# Patient Record
Sex: Male | Born: 1940 | Race: Black or African American | Hispanic: No | Marital: Married | Smoking: Former smoker
Health system: Southern US, Community
[De-identification: ages and names within clinical notes are randomized; demographics above are authoritative.]

## PROBLEM LIST (undated history)

## (undated) DIAGNOSIS — I2699 Other pulmonary embolism without acute cor pulmonale: Secondary | ICD-10-CM

## (undated) DIAGNOSIS — D126 Benign neoplasm of colon, unspecified: Secondary | ICD-10-CM

## (undated) DIAGNOSIS — K648 Other hemorrhoids: Secondary | ICD-10-CM

## (undated) DIAGNOSIS — R12 Heartburn: Secondary | ICD-10-CM

## (undated) DIAGNOSIS — K219 Gastro-esophageal reflux disease without esophagitis: Secondary | ICD-10-CM

## (undated) DIAGNOSIS — D1803 Hemangioma of intra-abdominal structures: Secondary | ICD-10-CM

## (undated) DIAGNOSIS — R51 Headache: Secondary | ICD-10-CM

## (undated) DIAGNOSIS — I10 Essential (primary) hypertension: Secondary | ICD-10-CM

## (undated) DIAGNOSIS — D72819 Decreased white blood cell count, unspecified: Secondary | ICD-10-CM

## (undated) DIAGNOSIS — K635 Polyp of colon: Secondary | ICD-10-CM

## (undated) DIAGNOSIS — G8929 Other chronic pain: Secondary | ICD-10-CM

## (undated) DIAGNOSIS — N400 Enlarged prostate without lower urinary tract symptoms: Secondary | ICD-10-CM

## (undated) DIAGNOSIS — D649 Anemia, unspecified: Secondary | ICD-10-CM

## (undated) DIAGNOSIS — R519 Headache, unspecified: Secondary | ICD-10-CM

## (undated) DIAGNOSIS — K760 Fatty (change of) liver, not elsewhere classified: Secondary | ICD-10-CM

## (undated) DIAGNOSIS — K449 Diaphragmatic hernia without obstruction or gangrene: Secondary | ICD-10-CM

## (undated) HISTORY — DX: Headache: R51

## (undated) HISTORY — DX: Headache, unspecified: R51.9

## (undated) HISTORY — DX: Gastro-esophageal reflux disease without esophagitis: K21.9

## (undated) HISTORY — DX: Anemia, unspecified: D64.9

## (undated) HISTORY — DX: Other pulmonary embolism without acute cor pulmonale: I26.99

## (undated) HISTORY — DX: Essential (primary) hypertension: I10

## (undated) HISTORY — DX: Decreased white blood cell count, unspecified: D72.819

## (undated) HISTORY — DX: Heartburn: R12

## (undated) HISTORY — DX: Benign neoplasm of colon, unspecified: D12.6

## (undated) HISTORY — DX: Benign prostatic hyperplasia without lower urinary tract symptoms: N40.0

## (undated) HISTORY — DX: Other chronic pain: G89.29

## (undated) HISTORY — DX: Polyp of colon: K63.5

## (undated) HISTORY — PX: CATARACT EXTRACTION: SUR2

## (undated) HISTORY — DX: Fatty (change of) liver, not elsewhere classified: K76.0

## (undated) HISTORY — DX: Other hemorrhoids: K64.8

## (undated) HISTORY — PX: COLONOSCOPY: SHX174

## (undated) HISTORY — DX: Diaphragmatic hernia without obstruction or gangrene: K44.9

## (undated) HISTORY — DX: Hemangioma of intra-abdominal structures: D18.03

---

## 1998-04-02 ENCOUNTER — Emergency Department (HOSPITAL_COMMUNITY): Admission: EM | Admit: 1998-04-02 | Discharge: 1998-04-02 | Payer: Self-pay | Admitting: Emergency Medicine

## 1998-04-23 ENCOUNTER — Ambulatory Visit (HOSPITAL_COMMUNITY): Admission: RE | Admit: 1998-04-23 | Discharge: 1998-04-23 | Payer: Self-pay | Admitting: Gastroenterology

## 1998-09-24 ENCOUNTER — Encounter: Admission: RE | Admit: 1998-09-24 | Discharge: 1998-09-24 | Payer: Self-pay | Admitting: Internal Medicine

## 1999-01-01 ENCOUNTER — Encounter: Admission: RE | Admit: 1999-01-01 | Discharge: 1999-01-01 | Payer: Self-pay | Admitting: Internal Medicine

## 1999-03-09 ENCOUNTER — Encounter: Payer: Self-pay | Admitting: Emergency Medicine

## 1999-03-09 ENCOUNTER — Emergency Department (HOSPITAL_COMMUNITY): Admission: EM | Admit: 1999-03-09 | Discharge: 1999-03-09 | Payer: Self-pay | Admitting: Emergency Medicine

## 1999-08-08 ENCOUNTER — Other Ambulatory Visit: Admission: RE | Admit: 1999-08-08 | Discharge: 1999-08-08 | Payer: Self-pay | Admitting: Gastroenterology

## 2000-05-25 ENCOUNTER — Emergency Department (HOSPITAL_COMMUNITY): Admission: EM | Admit: 2000-05-25 | Discharge: 2000-05-25 | Payer: Self-pay | Admitting: *Deleted

## 2000-09-25 ENCOUNTER — Emergency Department (HOSPITAL_COMMUNITY): Admission: EM | Admit: 2000-09-25 | Discharge: 2000-09-25 | Payer: Self-pay | Admitting: Emergency Medicine

## 2000-10-07 ENCOUNTER — Ambulatory Visit (HOSPITAL_COMMUNITY): Admission: RE | Admit: 2000-10-07 | Discharge: 2000-10-07 | Payer: Self-pay | Admitting: Gastroenterology

## 2000-10-07 ENCOUNTER — Encounter: Payer: Self-pay | Admitting: Gastroenterology

## 2001-01-05 ENCOUNTER — Ambulatory Visit (HOSPITAL_COMMUNITY): Admission: RE | Admit: 2001-01-05 | Discharge: 2001-01-05 | Payer: Self-pay | Admitting: Internal Medicine

## 2001-01-05 ENCOUNTER — Encounter: Payer: Self-pay | Admitting: Internal Medicine

## 2001-07-22 ENCOUNTER — Emergency Department (HOSPITAL_COMMUNITY): Admission: EM | Admit: 2001-07-22 | Discharge: 2001-07-22 | Payer: Self-pay | Admitting: Emergency Medicine

## 2001-07-22 ENCOUNTER — Encounter: Payer: Self-pay | Admitting: Emergency Medicine

## 2001-09-20 ENCOUNTER — Encounter: Payer: Self-pay | Admitting: Emergency Medicine

## 2001-09-20 ENCOUNTER — Emergency Department (HOSPITAL_COMMUNITY): Admission: EM | Admit: 2001-09-20 | Discharge: 2001-09-20 | Payer: Self-pay | Admitting: Emergency Medicine

## 2001-11-12 ENCOUNTER — Emergency Department (HOSPITAL_COMMUNITY): Admission: EM | Admit: 2001-11-12 | Discharge: 2001-11-12 | Payer: Self-pay | Admitting: *Deleted

## 2001-11-15 ENCOUNTER — Emergency Department (HOSPITAL_COMMUNITY): Admission: EM | Admit: 2001-11-15 | Discharge: 2001-11-15 | Payer: Self-pay | Admitting: Emergency Medicine

## 2002-01-29 ENCOUNTER — Emergency Department (HOSPITAL_COMMUNITY): Admission: EM | Admit: 2002-01-29 | Discharge: 2002-01-29 | Payer: Self-pay | Admitting: Emergency Medicine

## 2002-09-15 ENCOUNTER — Ambulatory Visit (HOSPITAL_COMMUNITY): Admission: RE | Admit: 2002-09-15 | Discharge: 2002-09-15 | Payer: Self-pay | Admitting: Internal Medicine

## 2002-09-15 ENCOUNTER — Encounter: Payer: Self-pay | Admitting: Internal Medicine

## 2003-09-01 ENCOUNTER — Ambulatory Visit (HOSPITAL_COMMUNITY): Admission: RE | Admit: 2003-09-01 | Discharge: 2003-09-01 | Payer: Self-pay | Admitting: Oncology

## 2003-09-19 ENCOUNTER — Emergency Department (HOSPITAL_COMMUNITY): Admission: AD | Admit: 2003-09-19 | Discharge: 2003-09-19 | Payer: Self-pay | Admitting: Family Medicine

## 2003-10-05 ENCOUNTER — Encounter (INDEPENDENT_AMBULATORY_CARE_PROVIDER_SITE_OTHER): Payer: Self-pay | Admitting: Specialist

## 2003-10-05 ENCOUNTER — Other Ambulatory Visit: Admission: RE | Admit: 2003-10-05 | Discharge: 2003-10-05 | Payer: Self-pay | Admitting: Oncology

## 2003-11-03 ENCOUNTER — Ambulatory Visit (HOSPITAL_COMMUNITY): Admission: RE | Admit: 2003-11-03 | Discharge: 2003-11-03 | Payer: Self-pay | Admitting: Internal Medicine

## 2004-02-23 ENCOUNTER — Emergency Department (HOSPITAL_COMMUNITY): Admission: EM | Admit: 2004-02-23 | Discharge: 2004-02-23 | Payer: Self-pay | Admitting: Emergency Medicine

## 2004-08-09 ENCOUNTER — Emergency Department (HOSPITAL_COMMUNITY): Admission: EM | Admit: 2004-08-09 | Discharge: 2004-08-09 | Payer: Self-pay | Admitting: Emergency Medicine

## 2004-09-03 ENCOUNTER — Emergency Department (HOSPITAL_COMMUNITY): Admission: EM | Admit: 2004-09-03 | Discharge: 2004-09-03 | Payer: Self-pay | Admitting: Emergency Medicine

## 2004-09-19 ENCOUNTER — Ambulatory Visit: Payer: Self-pay | Admitting: Family Medicine

## 2004-10-10 ENCOUNTER — Emergency Department (HOSPITAL_COMMUNITY): Admission: EM | Admit: 2004-10-10 | Discharge: 2004-10-10 | Payer: Self-pay | Admitting: Emergency Medicine

## 2004-10-24 ENCOUNTER — Ambulatory Visit: Payer: Self-pay | Admitting: Family Medicine

## 2004-11-18 ENCOUNTER — Ambulatory Visit: Payer: Self-pay | Admitting: Oncology

## 2004-11-18 ENCOUNTER — Emergency Department (HOSPITAL_COMMUNITY): Admission: RE | Admit: 2004-11-18 | Discharge: 2004-11-18 | Payer: Self-pay | Admitting: Oncology

## 2004-12-02 ENCOUNTER — Ambulatory Visit: Payer: Self-pay | Admitting: Family Medicine

## 2005-01-01 ENCOUNTER — Emergency Department (HOSPITAL_COMMUNITY): Admission: EM | Admit: 2005-01-01 | Discharge: 2005-01-01 | Payer: Self-pay | Admitting: Emergency Medicine

## 2005-03-03 ENCOUNTER — Ambulatory Visit: Payer: Self-pay | Admitting: Family Medicine

## 2005-04-01 ENCOUNTER — Ambulatory Visit: Payer: Self-pay | Admitting: Family Medicine

## 2005-04-03 ENCOUNTER — Ambulatory Visit: Payer: Self-pay | Admitting: *Deleted

## 2005-04-08 ENCOUNTER — Ambulatory Visit: Payer: Self-pay | Admitting: Family Medicine

## 2005-04-25 ENCOUNTER — Emergency Department (HOSPITAL_COMMUNITY): Admission: EM | Admit: 2005-04-25 | Discharge: 2005-04-25 | Payer: Self-pay | Admitting: Emergency Medicine

## 2005-04-28 ENCOUNTER — Ambulatory Visit: Payer: Self-pay | Admitting: Family Medicine

## 2005-05-23 ENCOUNTER — Emergency Department (HOSPITAL_COMMUNITY): Admission: EM | Admit: 2005-05-23 | Discharge: 2005-05-23 | Payer: Self-pay | Admitting: Emergency Medicine

## 2005-06-13 ENCOUNTER — Emergency Department (HOSPITAL_COMMUNITY): Admission: EM | Admit: 2005-06-13 | Discharge: 2005-06-13 | Payer: Self-pay | Admitting: Emergency Medicine

## 2005-06-20 ENCOUNTER — Ambulatory Visit: Payer: Self-pay | Admitting: Family Medicine

## 2005-07-03 ENCOUNTER — Ambulatory Visit: Payer: Self-pay | Admitting: Family Medicine

## 2005-07-18 ENCOUNTER — Ambulatory Visit: Payer: Self-pay | Admitting: Family Medicine

## 2005-07-24 ENCOUNTER — Ambulatory Visit: Payer: Self-pay | Admitting: Family Medicine

## 2005-07-29 ENCOUNTER — Ambulatory Visit: Payer: Self-pay | Admitting: Family Medicine

## 2005-08-19 ENCOUNTER — Emergency Department (HOSPITAL_COMMUNITY): Admission: EM | Admit: 2005-08-19 | Discharge: 2005-08-19 | Payer: Self-pay | Admitting: Emergency Medicine

## 2005-09-02 ENCOUNTER — Ambulatory Visit: Payer: Self-pay | Admitting: Family Medicine

## 2005-11-17 ENCOUNTER — Ambulatory Visit: Payer: Self-pay | Admitting: Oncology

## 2005-11-25 ENCOUNTER — Emergency Department (HOSPITAL_COMMUNITY): Admission: EM | Admit: 2005-11-25 | Discharge: 2005-11-25 | Payer: Self-pay | Admitting: Emergency Medicine

## 2005-12-17 ENCOUNTER — Emergency Department (HOSPITAL_COMMUNITY): Admission: EM | Admit: 2005-12-17 | Discharge: 2005-12-17 | Payer: Self-pay | Admitting: Internal Medicine

## 2005-12-18 ENCOUNTER — Emergency Department (HOSPITAL_COMMUNITY): Admission: EM | Admit: 2005-12-18 | Discharge: 2005-12-18 | Payer: Self-pay | Admitting: Family Medicine

## 2006-02-24 ENCOUNTER — Emergency Department (HOSPITAL_COMMUNITY): Admission: EM | Admit: 2006-02-24 | Discharge: 2006-02-24 | Payer: Self-pay | Admitting: Emergency Medicine

## 2006-02-25 ENCOUNTER — Ambulatory Visit: Payer: Self-pay | Admitting: Family Medicine

## 2006-02-27 ENCOUNTER — Ambulatory Visit: Payer: Self-pay | Admitting: Family Medicine

## 2006-03-19 ENCOUNTER — Ambulatory Visit: Payer: Self-pay | Admitting: Gastroenterology

## 2006-03-31 ENCOUNTER — Ambulatory Visit (HOSPITAL_COMMUNITY): Admission: RE | Admit: 2006-03-31 | Discharge: 2006-03-31 | Payer: Self-pay | Admitting: Internal Medicine

## 2006-03-31 ENCOUNTER — Ambulatory Visit: Payer: Self-pay | Admitting: Family Medicine

## 2006-05-18 ENCOUNTER — Emergency Department (HOSPITAL_COMMUNITY): Admission: EM | Admit: 2006-05-18 | Discharge: 2006-05-18 | Payer: Self-pay | Admitting: Emergency Medicine

## 2006-05-18 ENCOUNTER — Ambulatory Visit: Payer: Self-pay | Admitting: Family Medicine

## 2006-06-21 ENCOUNTER — Ambulatory Visit: Payer: Self-pay | Admitting: Family Medicine

## 2006-06-21 ENCOUNTER — Observation Stay (HOSPITAL_COMMUNITY): Admission: EM | Admit: 2006-06-21 | Discharge: 2006-06-22 | Payer: Self-pay | Admitting: Emergency Medicine

## 2006-07-01 ENCOUNTER — Ambulatory Visit: Payer: Self-pay | Admitting: Family Medicine

## 2006-07-31 ENCOUNTER — Emergency Department (HOSPITAL_COMMUNITY): Admission: EM | Admit: 2006-07-31 | Discharge: 2006-07-31 | Payer: Self-pay | Admitting: Emergency Medicine

## 2006-08-17 ENCOUNTER — Ambulatory Visit: Payer: Self-pay | Admitting: Gastroenterology

## 2006-08-25 ENCOUNTER — Ambulatory Visit: Payer: Self-pay | Admitting: Gastroenterology

## 2006-08-25 ENCOUNTER — Encounter (INDEPENDENT_AMBULATORY_CARE_PROVIDER_SITE_OTHER): Payer: Self-pay | Admitting: *Deleted

## 2006-08-26 ENCOUNTER — Ambulatory Visit: Payer: Self-pay | Admitting: Gastroenterology

## 2006-09-02 ENCOUNTER — Ambulatory Visit: Payer: Self-pay | Admitting: Gastroenterology

## 2006-09-02 LAB — CONVERTED CEMR LAB
BUN: 13 mg/dL (ref 6–23)
Creatinine, Ser: 1.1 mg/dL (ref 0.4–1.5)

## 2006-09-03 ENCOUNTER — Ambulatory Visit: Payer: Self-pay | Admitting: *Deleted

## 2006-09-18 ENCOUNTER — Ambulatory Visit: Payer: Self-pay | Admitting: Family Medicine

## 2006-09-21 ENCOUNTER — Ambulatory Visit (HOSPITAL_COMMUNITY): Admission: RE | Admit: 2006-09-21 | Discharge: 2006-09-21 | Payer: Self-pay | Admitting: Family Medicine

## 2006-11-08 ENCOUNTER — Emergency Department (HOSPITAL_COMMUNITY): Admission: EM | Admit: 2006-11-08 | Discharge: 2006-11-08 | Payer: Self-pay | Admitting: Emergency Medicine

## 2006-12-31 ENCOUNTER — Ambulatory Visit: Payer: Self-pay | Admitting: Family Medicine

## 2007-02-27 ENCOUNTER — Emergency Department (HOSPITAL_COMMUNITY): Admission: EM | Admit: 2007-02-27 | Discharge: 2007-02-27 | Payer: Self-pay | Admitting: Emergency Medicine

## 2007-03-24 ENCOUNTER — Emergency Department (HOSPITAL_COMMUNITY): Admission: EM | Admit: 2007-03-24 | Discharge: 2007-03-24 | Payer: Self-pay | Admitting: Emergency Medicine

## 2007-04-15 ENCOUNTER — Ambulatory Visit: Payer: Self-pay | Admitting: Family Medicine

## 2007-04-27 DIAGNOSIS — K219 Gastro-esophageal reflux disease without esophagitis: Secondary | ICD-10-CM | POA: Insufficient documentation

## 2007-04-27 DIAGNOSIS — I10 Essential (primary) hypertension: Secondary | ICD-10-CM | POA: Insufficient documentation

## 2007-05-31 ENCOUNTER — Ambulatory Visit: Payer: Self-pay | Admitting: Family Medicine

## 2007-06-22 ENCOUNTER — Ambulatory Visit: Payer: Self-pay | Admitting: Internal Medicine

## 2007-07-13 ENCOUNTER — Ambulatory Visit: Payer: Self-pay | Admitting: Infectious Diseases

## 2007-07-13 ENCOUNTER — Observation Stay (HOSPITAL_COMMUNITY): Admission: EM | Admit: 2007-07-13 | Discharge: 2007-07-14 | Payer: Self-pay | Admitting: Emergency Medicine

## 2007-07-14 ENCOUNTER — Encounter: Payer: Self-pay | Admitting: Internal Medicine

## 2007-07-21 ENCOUNTER — Encounter (INDEPENDENT_AMBULATORY_CARE_PROVIDER_SITE_OTHER): Payer: Self-pay | Admitting: *Deleted

## 2007-07-22 ENCOUNTER — Ambulatory Visit: Payer: Self-pay

## 2007-08-04 ENCOUNTER — Ambulatory Visit: Payer: Self-pay | Admitting: Internal Medicine

## 2007-08-05 ENCOUNTER — Encounter (INDEPENDENT_AMBULATORY_CARE_PROVIDER_SITE_OTHER): Payer: Self-pay | Admitting: Family Medicine

## 2007-10-15 ENCOUNTER — Ambulatory Visit: Payer: Self-pay | Admitting: Internal Medicine

## 2007-10-20 ENCOUNTER — Ambulatory Visit (HOSPITAL_COMMUNITY): Admission: RE | Admit: 2007-10-20 | Discharge: 2007-10-20 | Payer: Self-pay | Admitting: Nurse Practitioner

## 2007-10-27 ENCOUNTER — Ambulatory Visit: Payer: Self-pay | Admitting: Gastroenterology

## 2007-10-29 ENCOUNTER — Ambulatory Visit (HOSPITAL_COMMUNITY): Admission: RE | Admit: 2007-10-29 | Discharge: 2007-10-29 | Payer: Self-pay | Admitting: Gastroenterology

## 2007-12-23 ENCOUNTER — Ambulatory Visit: Payer: Self-pay | Admitting: Nurse Practitioner

## 2008-01-24 ENCOUNTER — Ambulatory Visit: Payer: Self-pay | Admitting: Nurse Practitioner

## 2008-01-24 LAB — CONVERTED CEMR LAB
AST: 15 units/L (ref 0–37)
Albumin: 4.8 g/dL (ref 3.5–5.2)
Alkaline Phosphatase: 23 units/L — ABNORMAL LOW (ref 39–117)
BUN: 13 mg/dL (ref 6–23)
Basophils Absolute: 0 10*3/uL (ref 0.0–0.1)
Basophils Relative: 1 % (ref 0–1)
Creatinine, Ser: 0.95 mg/dL (ref 0.40–1.50)
Eosinophils Absolute: 0 10*3/uL (ref 0.0–0.7)
HDL: 60 mg/dL (ref >39)
LDL Cholesterol: 79 mg/dL (ref 0–99)
MCHC: 33.7 g/dL (ref 30.0–36.0)
MCV: 93.3 fL (ref 78.0–100.0)
Monocytes Relative: 17 % — ABNORMAL HIGH (ref 3–12)
Neutrophils Relative %: 52 % (ref 43–77)
Platelets: 188 10*3/uL (ref 150–400)
Potassium: 4.6 meq/L (ref 3.5–5.3)
RBC: 4.36 M/uL (ref 4.22–5.81)
RDW: 15.5 % (ref 11.5–15.5)
TSH: 1.575 microintl units/mL (ref 0.350–5.50)
Total CHOL/HDL Ratio: 2.5

## 2008-02-03 ENCOUNTER — Emergency Department (HOSPITAL_COMMUNITY): Admission: EM | Admit: 2008-02-03 | Discharge: 2008-02-03 | Payer: Self-pay | Admitting: Emergency Medicine

## 2008-03-08 ENCOUNTER — Ambulatory Visit: Payer: Self-pay | Admitting: Family Medicine

## 2008-03-13 ENCOUNTER — Ambulatory Visit (HOSPITAL_COMMUNITY): Admission: RE | Admit: 2008-03-13 | Discharge: 2008-03-13 | Payer: Self-pay | Admitting: Family Medicine

## 2008-03-29 ENCOUNTER — Ambulatory Visit (HOSPITAL_COMMUNITY): Admission: RE | Admit: 2008-03-29 | Discharge: 2008-03-29 | Payer: Self-pay | Admitting: Cardiology

## 2008-04-04 ENCOUNTER — Ambulatory Visit: Payer: Self-pay | Admitting: Internal Medicine

## 2008-04-20 ENCOUNTER — Encounter: Admission: RE | Admit: 2008-04-20 | Discharge: 2008-05-09 | Payer: Self-pay | Admitting: Family Medicine

## 2008-12-05 ENCOUNTER — Emergency Department (HOSPITAL_COMMUNITY): Admission: EM | Admit: 2008-12-05 | Discharge: 2008-12-05 | Payer: Self-pay | Admitting: Family Medicine

## 2009-01-01 DIAGNOSIS — D126 Benign neoplasm of colon, unspecified: Secondary | ICD-10-CM

## 2009-01-01 HISTORY — DX: Benign neoplasm of colon, unspecified: D12.6

## 2009-01-17 ENCOUNTER — Emergency Department (HOSPITAL_COMMUNITY): Admission: EM | Admit: 2009-01-17 | Discharge: 2009-01-17 | Payer: Self-pay | Admitting: Emergency Medicine

## 2009-01-19 ENCOUNTER — Ambulatory Visit (HOSPITAL_COMMUNITY): Admission: RE | Admit: 2009-01-19 | Discharge: 2009-01-19 | Payer: Self-pay | Admitting: Gastroenterology

## 2009-01-19 ENCOUNTER — Encounter (INDEPENDENT_AMBULATORY_CARE_PROVIDER_SITE_OTHER): Payer: Self-pay | Admitting: Gastroenterology

## 2009-01-19 ENCOUNTER — Encounter (INDEPENDENT_AMBULATORY_CARE_PROVIDER_SITE_OTHER): Payer: Self-pay | Admitting: *Deleted

## 2009-02-22 ENCOUNTER — Encounter: Admission: RE | Admit: 2009-02-22 | Discharge: 2009-02-22 | Payer: Self-pay | Admitting: Gastroenterology

## 2009-02-22 ENCOUNTER — Encounter (INDEPENDENT_AMBULATORY_CARE_PROVIDER_SITE_OTHER): Payer: Self-pay | Admitting: *Deleted

## 2009-05-03 ENCOUNTER — Inpatient Hospital Stay (HOSPITAL_COMMUNITY): Admission: EM | Admit: 2009-05-03 | Discharge: 2009-05-04 | Payer: Self-pay | Admitting: Emergency Medicine

## 2009-05-04 ENCOUNTER — Encounter (INDEPENDENT_AMBULATORY_CARE_PROVIDER_SITE_OTHER): Payer: Self-pay | Admitting: *Deleted

## 2009-08-09 ENCOUNTER — Encounter (INDEPENDENT_AMBULATORY_CARE_PROVIDER_SITE_OTHER): Payer: Self-pay | Admitting: *Deleted

## 2009-08-17 ENCOUNTER — Telehealth: Payer: Self-pay | Admitting: Gastroenterology

## 2009-08-20 ENCOUNTER — Encounter: Payer: Self-pay | Admitting: Gastroenterology

## 2009-08-27 ENCOUNTER — Ambulatory Visit: Payer: Self-pay | Admitting: Gastroenterology

## 2009-08-31 ENCOUNTER — Ambulatory Visit: Payer: Self-pay | Admitting: Gastroenterology

## 2009-08-31 ENCOUNTER — Encounter: Payer: Self-pay | Admitting: Gastroenterology

## 2009-09-03 ENCOUNTER — Encounter: Payer: Self-pay | Admitting: Gastroenterology

## 2009-09-03 ENCOUNTER — Telehealth (INDEPENDENT_AMBULATORY_CARE_PROVIDER_SITE_OTHER): Payer: Self-pay

## 2009-09-03 ENCOUNTER — Encounter (INDEPENDENT_AMBULATORY_CARE_PROVIDER_SITE_OTHER): Payer: Self-pay

## 2009-09-04 ENCOUNTER — Telehealth (INDEPENDENT_AMBULATORY_CARE_PROVIDER_SITE_OTHER): Payer: Self-pay

## 2009-12-22 ENCOUNTER — Emergency Department (HOSPITAL_COMMUNITY): Admission: EM | Admit: 2009-12-22 | Discharge: 2009-12-23 | Payer: Self-pay | Admitting: Emergency Medicine

## 2010-01-08 ENCOUNTER — Emergency Department (HOSPITAL_COMMUNITY): Admission: EM | Admit: 2010-01-08 | Discharge: 2010-01-08 | Payer: Self-pay | Admitting: Emergency Medicine

## 2010-02-18 ENCOUNTER — Telehealth: Payer: Self-pay | Admitting: Gastroenterology

## 2010-02-21 ENCOUNTER — Telehealth: Payer: Self-pay | Admitting: Gastroenterology

## 2010-04-22 ENCOUNTER — Telehealth: Payer: Self-pay | Admitting: Gastroenterology

## 2010-05-15 ENCOUNTER — Ambulatory Visit: Payer: Self-pay | Admitting: Gastroenterology

## 2010-05-15 DIAGNOSIS — R1084 Generalized abdominal pain: Secondary | ICD-10-CM

## 2010-05-15 DIAGNOSIS — R109 Unspecified abdominal pain: Secondary | ICD-10-CM | POA: Insufficient documentation

## 2010-05-15 DIAGNOSIS — G8929 Other chronic pain: Secondary | ICD-10-CM

## 2010-05-15 LAB — CONVERTED CEMR LAB
AST: 26 units/L (ref 0–37)
Alkaline Phosphatase: 25 units/L — ABNORMAL LOW (ref 39–117)
BUN: 13 mg/dL (ref 6–23)
Basophils Absolute: 0 10*3/uL (ref 0.0–0.1)
Calcium: 9.2 mg/dL (ref 8.4–10.5)
GFR calc non Af Amer: 125.64 mL/min (ref >60)
Glucose, Bld: 100 mg/dL — ABNORMAL HIGH (ref 70–99)
Lymphocytes Relative: 28.5 % (ref 12.0–46.0)
Monocytes Relative: 12.2 % — ABNORMAL HIGH (ref 3.0–12.0)
Platelets: 137 10*3/uL — ABNORMAL LOW (ref 150.0–400.0)
RDW: 13.8 % (ref 11.5–14.6)
TSH: 1.71 microintl units/mL (ref 0.35–5.50)
Total Bilirubin: 1 mg/dL (ref 0.3–1.2)

## 2010-05-16 ENCOUNTER — Telehealth: Payer: Self-pay | Admitting: Gastroenterology

## 2010-05-17 ENCOUNTER — Encounter: Payer: Self-pay | Admitting: Gastroenterology

## 2010-05-21 ENCOUNTER — Telehealth: Payer: Self-pay | Admitting: Gastroenterology

## 2010-05-21 ENCOUNTER — Encounter: Payer: Self-pay | Admitting: Gastroenterology

## 2010-05-29 ENCOUNTER — Telehealth: Payer: Self-pay | Admitting: Gastroenterology

## 2010-06-11 ENCOUNTER — Telehealth: Payer: Self-pay | Admitting: Gastroenterology

## 2010-06-17 ENCOUNTER — Ambulatory Visit: Payer: Self-pay | Admitting: Gastroenterology

## 2010-06-17 LAB — CONVERTED CEMR LAB
Basophils Relative: 0.7 % (ref 0.0–3.0)
Eosinophils Absolute: 0 10*3/uL (ref 0.0–0.7)
Eosinophils Relative: 0.6 % (ref 0.0–5.0)
Hemoglobin: 14.1 g/dL (ref 13.0–17.0)
Lymphocytes Relative: 42.5 % (ref 12.0–46.0)
MCHC: 34.6 g/dL (ref 30.0–36.0)
MCV: 94.1 fL (ref 78.0–100.0)
Neutro Abs: 1.2 10*3/uL — ABNORMAL LOW (ref 1.4–7.7)
RBC: 4.32 M/uL (ref 4.22–5.81)

## 2010-07-10 ENCOUNTER — Telehealth: Payer: Self-pay | Admitting: Gastroenterology

## 2010-08-08 ENCOUNTER — Telehealth: Payer: Self-pay | Admitting: Gastroenterology

## 2010-09-12 ENCOUNTER — Telehealth: Payer: Self-pay | Admitting: Gastroenterology

## 2010-09-19 ENCOUNTER — Telehealth: Payer: Self-pay | Admitting: Gastroenterology

## 2010-10-10 ENCOUNTER — Telehealth: Payer: Self-pay | Admitting: Gastroenterology

## 2010-11-05 ENCOUNTER — Emergency Department (HOSPITAL_COMMUNITY)
Admission: EM | Admit: 2010-11-05 | Discharge: 2010-11-05 | Payer: Self-pay | Source: Home / Self Care | Admitting: Emergency Medicine

## 2010-12-03 NOTE — Progress Notes (Signed)
Summary: "heavy" feeling in abd  Medications Added OMEPRAZOLE 20 MG CPDR (OMEPRAZOLE) Take 1 capsule p.o. one half hr. before breakfast       Phone Note Call from Patient Call back at [facility] Phone [phone]   Caller: Patient Call For: Dr. Russella Dar Reason for Call: Talk to Nurse Summary of Call: pt having a "heavy" feeling in the lower part of his abd since procedure last year... wants to speak to a nurse now about it rather than sch appt. Initial call taken by: Vallarie Mare,  February 18, 2010 11:57 AM  Follow-up for Phone Call        Pt. gets heavy feeling in lower abd. when he gets constipated. Advised to increase fiber and fliuds. Pt. is not taking a PPI as ordered for his Barrett's says he didn't know he was supposed to. Is using Zantac q.d. Is on medicaid so they will only cover Omeprazole Please advise on dose. Follow-up by: Teryl Lucy RN,  February 19, 2010 9:19 AM  Additional Follow-up for Phone Call Additional follow up Details #1::        He needs a PPI long term for Barretts. DC Zantac. Begin Omeprazole 20mg  by mouth qam, #30, 11 refils. May add Miralax OTC up to two times a day as needed if fiber and fluids are not successful in controlling constipation. Additional Follow-up by: Meryl Dare MD Clementeen Graham,  February 19, 2010 9:28 AM    Additional Follow-up for Phone Call Additional follow up Details #2::    pt. ntfd. of Dr. Ardell Isaacs orders and omeprazole rx. faxed to pharmacy. Follow-up by: Teryl Lucy RN,  February 19, 2010 11:10 AM  New/Updated Medications: OMEPRAZOLE 20 MG CPDR (OMEPRAZOLE) Take 1 capsule p.o. one half hr. before breakfast Prescriptions: OMEPRAZOLE 20 MG CPDR (OMEPRAZOLE) Take 1 capsule p.o. one half hr. before breakfast  #30 x 11   Entered by:   Teryl Lucy RN   Authorized by:   Meryl Dare MD [facility]   Signed by:   Teryl Lucy RN on 02/19/2010   Method used:   Electronically to        [facility] [phone]* (retail)       [address]  [place], [place]  [address]       Ph: [phone]       Fax: [phone]   RxID:   [id number]

## 2010-12-03 NOTE — Progress Notes (Signed)
Summary: Medication Refill   Phone Note Call from Patient Call back at Home Phone [phone]   Caller: Patient Call For: Dr. Russella Dar Reason for Call: Refill Medication Details for Reason: Medication Refill Summary of Call: Pt. called stating his pharmacy is still only dispensing 30 Omeprazole instead of 60 and will need new orders to reflect this dosage. He uses [facility] [address] [employer]. Please call him if you have questions. Thank you. Initial call taken by: Schuyler Amor,  September 12, 2010 9:50 AM  Follow-up for Phone Call        Called the pharmacy to clarify why pt recieved once daily dosing instead of the two times a day dosing when we sent in the prescription for that on 08/08/10. Pharmacist states the pt called in the refill under the old refill number on his bottle and picked up the old prescription under that ld number. Pharmacist states she deleted the old number and in the future pt will not be able to use that number and can only get refills under the new two times a day dosing prescription.  Left a message on pts voicemial informing him  of this and to take 2 tablets by mouth until he runs out of medicine. Then we can possibly give him samples until next refill. Told pt to call back with any other concerns and questions.  Follow-up by: Christie Nottingham CMA Duncan Dull),  September 12, 2010 10:36 AM

## 2010-12-03 NOTE — Progress Notes (Signed)
Summary: Medication   Phone Note Call from Patient   Caller: Patient Call For: Dr. Russella Dar Reason for Call: Talk to Nurse Summary of Call: Ins. needs prior auth for his Omeprazole med.   [phone] Initial call taken by: Karna Christmas,  May 16, 2010 11:59 AM  Follow-up for Phone Call        waiting on fax form to fill out for PA Follow-up by: Christie Nottingham CMA Duncan Dull),  May 16, 2010 4:34 PM  Additional Follow-up for Phone Call Additional follow up Details #1::        Left a message for pt letting him know we filled out the form for his PA and faxed it. We are waiting on a response from the insurance company.  Additional Follow-up by: Christie Nottingham CMA Duncan Dull),  May 17, 2010 8:24 AM    Additional Follow-up for Phone Call Additional follow up Details #2::    Left a message for pt telling him his insurance company sent [place] a fax stating that a PA for omeprazole is not required at this time. I told him to contact his insurance company if he is having further problems with his medicine being approved. Follow-up by: Christie Nottingham CMA Duncan Dull),  May 17, 2010 4:29 PM

## 2010-12-03 NOTE — Assessment & Plan Note (Signed)
Summary: "funny feeling in stomach"/sheri   History of Present Illness Visit Type: Follow-up Visit Primary GI MD: Elie Goody MD [facility] Primary Provider: Duke Salvia, MD Chief Complaint: pt has a nauseating abdominal pain History of Present Illness:   This is a 70 year old male, who complains of sensation of phlegm in his throat and recurrent throat clearing. He has chronic GERD and Barrett's esophagus and was off acid suppressants for several months and restarted them on our advice about 3 months ago. The symptoms improved temporarily but have returned. He states he saw asn ENT physician previously for this but does not recall any specific diagnosis made.  He also has intermittent lower abdominal pain, associated with nausea. These symptoms are mild, unpredictable and not necessarily associated with meals, or any digestive function. He underwent an abdominal ultrasound that was negative, except for a small left renal lesion, in July 2010 an abd/pelvic CT in April 2010 which showed a small hepatic hemangioma and a small left renal lesion.   GI Review of Systems    Reports abdominal pain and  nausea.     Location of  Abdominal pain: lower abdomen.    Denies acid reflux, belching, bloating, chest pain, dysphagia with liquids, dysphagia with solids, heartburn, loss of appetite, vomiting, vomiting blood, weight loss, and  weight gain.        Denies anal fissure, black tarry stools, change in bowel habit, constipation, diarrhea, diverticulosis, fecal incontinence, heme positive stool, hemorrhoids, irritable bowel syndrome, jaundice, light color stool, liver problems, rectal bleeding, and  rectal pain. Preventive Screening-Counseling & Management  Alcohol-Tobacco     Smoking Status: quit      Drug Use:  no.     Current Medications (verified): 1)  Norvasc 5 Mg Tabs (Amlodipine Besylate) .... Take 1 Tablet By Mouth Once A Day 2)  Omeprazole 20 Mg Cpdr (Omeprazole) .... Take 1 Capsule P.o.  One Half Hr. 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 Cataract Surgery, Left Eye  Family History: Family History of Diabetes: Father (deceased), 2 brothers, sister  Social History: Retired 8 children  Heavy drinker since age 37--Alcohol Use - no Quit  ~10/2009 Patient is a former smoker.  Illicit Drug Use - no Smoking Status:  quit Drug Use:  no  Review of Systems       The pertinent positives and negatives are noted as above and in the HPI. All other ROS were reviewed and were negative.   Vital Signs:  Patient profile:   70 year old male Height:      71 inches Weight:      184 pounds BMI:     25.76 Pulse rate:   64 / minute Pulse rhythm:   regular BP sitting:   120 / 80  (left arm) Cuff size:   regular  Vitals Entered By: Francee Piccolo CMA Duncan Dull) (May 15, 2010 10:26 AM)  Physical Exam  General:  Well developed, well nourished, no acute distress. Head:  Normocephalic and atraumatic. Eyes:  PERRLA, no icterus. Ears:  Normal auditory acuity. Mouth:  No deformity or lesions, dentition normal. Lungs:  Clear throughout to auscultation. Heart:  Regular rate and rhythm; no murmurs, rubs,  or bruits. Abdomen:  Soft, nontender and nondistended. No masses, hepatosplenomegaly or hernias noted. Normal bowel sounds. Psych:  Alert and cooperative. Normal mood and affect.  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 The long term need for daily acid suppressant in the setting of Barrett's, and GERD was discussed with the  patient again. Increase omeprazole to 40 mg daily. If symptoms not well-controlled consider reevaluation with ENT and a stronger PPI.  Problem # 2:  ABDOMINAL PAIN OTHER SPECIFIED SITE (ICD-789.09) Nonspecific mild lower abdominal pain. Abdominal ultrasound,  CT scan and colonoscopy, all performed in 2010, were unremarkable and there are no significant findings on physical examination today. Trial of anti-spasmodic and high-fiber diet. Orders: TLB-BMP (Basic Metabolic Panel-BMET) (80048-METABOL) TLB-CBC Platelet - w/Differential (85025-CBCD) TLB-Hepatic/Liver Function Pnl (80076-HEPATIC) TLB-TSH (Thyroid Stimulating Hormone) (84443-TSH) TLB-Lipase (83690-LIPASE)  Patient Instructions: 1)  Get your labs drawn today in the basement.  2)  Increase your omeprazole to 2 tablets by mouth once daily until finished and pick up your prescription from your pharmacy for the omeprazole 40mg  tablets.  3)  Please continue current medications.  4)  Please schedule a follow-up appointment as needed.  5)  The medication list was reviewed and reconciled.  All changed / newly prescribed medications were explained.  A complete medication list was provided to the patient / caregiver. 6)  Copy sent to : Duke Salvia, MD  Prescriptions: LEVSIN 0.125 MG TABS (HYOSCYAMINE SULFATE) 1-2 tablets by mouth four times a day as needed for abdominal pain  #60 x 11   Entered by:   Christie Nottingham CMA (AAMA)   Authorized by:   Meryl Dare MD [facility]   Signed by:   Meryl Dare MD [facility] on 05/15/2010   Method used:   Electronically to        [facility] [phone]* (retail)       [address]       [place], [place]  [address]       Ph: [phone]       Fax: [phone]   RxID:   [id number] OMEPRAZOLE 40 MG CPDR (OMEPRAZOLE) one tablet by mouth once daily  #30 x 11   Entered by:   Christie Nottingham CMA (AAMA)   Authorized by:   Meryl Dare MD [facility]   Signed by:   Meryl Dare MD [facility] on 05/15/2010    Method used:   Electronically to        [facility] [phone]* (retail)       [address]       [place], [place]  [address]       Ph: [phone]       Fax: [phone]   RxID:   [id number]

## 2010-12-03 NOTE — Medication Information (Signed)
Summary: Approved/[facility]  Approved/[facility]   Imported By: Lester [place]ens[place] 05/28/2010 09:30:48  _____________________________________________________________________  External Attachment:    Type:   Image     Comment:   External Document

## 2010-12-03 NOTE — Assessment & Plan Note (Signed)
Summary: Hospitall Discharge  Patient admitted with chest pain. AMI ruled out- likely espophagitis/GERD. He will need an outpatient stress test. Scheduled with  C[facility] on 9/18. In addition, he will need sccreening colonoscopy arranged by [facility]. Med list update. Summary dictated.

## 2010-12-03 NOTE — Medication Information (Signed)
Summary: Denial/[facility]  Denial/[facility]   Imported By: Lester E[place] 05/28/2010 09:28:39  _____________________________________________________________________  External Attachment:    Type:   Image     Comment:   External Document

## 2010-12-03 NOTE — Progress Notes (Signed)
Summary: Samples for Omneprozole?   Phone Note Call from Patient Call back at [facility] Phone [phone]   Call For: Dr Russella Dar Summary of Call: Only has 2 days left of Omneprozole and its too early to be able to get his refill. Can we get him some samples? Initial call taken by: Leanor Kail [facility],  September 19, 2010 10:28 AM  Follow-up for Phone Call        Left samples up front for pt to pick and told him that is is Prilosec 20mg  and not omeprazole 40 but to double up on the medicine to equal to what he is currently taking. Pt verbalized understanding.  Follow-up by: Christie Nottingham CMA Duncan Dull),  September 19, 2010 11:08 AM

## 2010-12-03 NOTE — Progress Notes (Signed)
Summary: Triage   Phone Note Call from Patient Call back at Home Phone [phone]   Caller: Patient Call For: Dr. Russella Dar Reason for Call: Talk to Nurse Summary of Call: Pt is having a lot of bleeding but does not know where it is coming from, also having alot of stomach pain Initial call taken by: [place] Johnson,  October 10, 2010 8:21 AM  Follow-up for Phone Call        Spoke with patient and he states that when he urinated he had blood with clots that came from his "joint" I clarified with patient that the blood was when he urinated and that it was coming from his penis. Informed patient that he needed to call his PCP for this problem. Patient states that his PCP is a cardiologist, suggested he still call the PCP or go to the ER/Urgent care. Patient verbalizes understanding.  Follow-up by: Selinda Michaels RN,  October 10, 2010 10:18 AM  Additional Follow-up for Phone Call Additional follow up Details #1::        Agree. Urgent care or ED for evaluation today. Additional Follow-up by: Meryl Dare MD Clementeen Graham,  October 10, 2010 11:17 AM

## 2010-12-03 NOTE — Progress Notes (Signed)
Summary: Triage   Phone Note Call from Patient Call back at Home Phone [phone]   Caller: Patient Call For: Dr. Russella Dar Reason for Call: Talk to Nurse Summary of Call: Would like to discuss meds Initial call taken by: Karna Christmas,  February 21, 2010 8:08 AM  Follow-up for Phone Call        Left message for patient to call back Darcey Nora RN, [facility]  February 21, 2010 9:19 AM  questions about omeprazole answered.  Darcey Nora RN, [facility]  February 21, 2010 11:10 AM Follow-up by: Darcey Nora RN, CGRN,  February 21, 2010 11:10 AM

## 2010-12-03 NOTE — Progress Notes (Signed)
Summary: Question about meds ordered today   Phone Note Call from Patient Call back at Home Phone [phone]   Call For: Dr Russella Dar Reason for Call: Talk to Nurse Summary of Call: Question about the medicine that was ordered for him today. Initial call taken by: Leanor Kail [facility],  May 29, 2010 2:43 PM  Follow-up for Phone Call        I have left a message for the patient to call back. Dottie Nelson-Smith CMA Duncan Dull)  May 29, 2010 3:40 PM   Patient questions whether it is okay to use levsin and omeprazole together. I have advised him that this will be okay for him to do. Follow-up by: Lamona Curl CMA Duncan Dull),  May 29, 2010 4:24 PM

## 2010-12-03 NOTE — Progress Notes (Signed)
Summary: speak to nurse   Phone Note Call from Patient Call back at Home Phone [phone]   Caller: Patient Call For: Russella Dar Reason for Call: Talk to Nurse Summary of Call: Patient wants to speak to nurse regarding blood work that he's suppose to be scheduled for. Initial call taken by: Tawni Levy,  June 11, 2010 11:25 AM  Follow-up for Phone Call        Given  time the lab is open. Follow-up by: Teryl Lucy RN,  June 11, 2010 12:10 PM

## 2010-12-03 NOTE — Progress Notes (Signed)
Summary: Switch medication/ too expensive   Phone Note Call from Patient Call back at Home Phone [phone]   Caller: Patient Call For: Marchelle Folks Summary of Call: Patient called stating he cannot afford Levsin b/c it is too expensive. He told me the pharmacist told him that we could contact the insurance company and get it approved and his copay will be cheaper. I called the insurance company and they still denied the medicine. I informed pt and he wants to be switched to something cheaper. I told him we could try sending in another medication. Dr. Russella Dar can we switch him to Bentyl or robinul forte? Initial call taken by: Christie Nottingham CMA Duncan Dull),  May 21, 2010 1:18 PM  Follow-up for Phone Call        He can switch to dicyclomine, glycopyrrolate or hyoscyamine.  Which ever one he prefers for cost and efficacy. Follow-up by: Meryl Dare MD Clementeen Graham,  May 27, 2010 3:26 PM  Additional Follow-up for Phone Call Additional follow up Details #1::        Called pharmacy to get prices on alternative medicines. Monica from [facility] states she can get the Levsin even cheaper with a discount card. The price came down to 25 dollars so I contacted the pt and asked if this was sufficient or do I need to switch him to a cheaper medicine. Pt agreed that this was a better price and he states he will pick up the medicine. Additional Follow-up by: Christie Nottingham CMA Duncan Dull),  May 28, 2010 2:04 PM

## 2010-12-03 NOTE — Progress Notes (Signed)
Summary: stomach "funny feeling"   Phone Note Call from Patient Call back at Home Phone [phone]   Caller: Patient Call For: Dr. Russella Dar Reason for Call: Talk to Nurse Summary of Call: having a "funny feeling" in his stomach and "doesnt know whats going on"... "stomach is going up and down even after eating a little" Initial call taken by: Vallarie Mare,  April 22, 2010 11:11 AM  Follow-up for Phone Call        Left message for patient to call back Darcey Nora RN, [facility]  April 22, 2010 11:19 AM  Patient  c/o "funny feeling in my stomach", when asked to describe "I just have a feeling".  He is unable to characterize any better, but wants to be seen for it .  He is given an appointment with Dr Russella Dar for July.  He is asked to call back if he has any symptoms I can help him with in the meantime.  Follow-up by: Darcey Nora RN, CGRN,  April 22, 2010 11:32 AM

## 2010-12-03 NOTE — Progress Notes (Signed)
Summary: ? re meds   Phone Note Call from Patient Call back at Home Phone [phone]   Caller: Patient Call For: Dr Russella Dar Reason for Call: Talk to Nurse Summary of Call: Patient has questions regarding medication Initial call taken by: Tawni Levy,  July 10, 2010 4:37 PM  Follow-up for Phone Call        left message for pt  to call back  Follow-up by: Christie Nottingham CMA Duncan Dull),  July 11, 2010 10:13 AM  Additional Follow-up for Phone Call Additional follow up Details #1::        Pt states 30 minutes after taking his omeprazole he is having blurred vision. Told pt that this is probably not from his omeprazole but to try taking that medication 30 mins apart from his BP meds and other medications. If the symptoms continue then to D/C the  omeprazole and call his PCP if that does not improve his symptoms.  Additional Follow-up by: Christie Nottingham CMA Duncan Dull),  July 11, 2010 11:57 AM

## 2010-12-03 NOTE — Progress Notes (Signed)
Summary: throat problems  Medications Added OMEPRAZOLE 40 MG CPDR (OMEPRAZOLE) Take 1 tablet by mouth two times a day (pharmacy-please d/c prescription for once daily dosing)       Phone Note Call from Patient Call back at [facility] Phone [phone]   Caller: Patient Call For: Dr. Russella Dar Reason for Call: Talk to Nurse Summary of Call: feels like something is "stuck in his throat"... mostly feels this when he lays down... wants to speak with a nurse before scheduling an appt Initial call taken by: Vallarie Mare,  August 08, 2010 11:39 AM  Follow-up for Phone Call        Left message for patient to call back. Dottie Nelson-Ivory Bail CMA [facility] Dull)  August 08, 2010 1:15 PM   Patient states that he continues to have a feeling of phlegm getting stuck in his throat especially when he lies down at night. He states that he has been taking his omeprazole 40 mg daily 30 minutes before breakfast as recommended by Dr Russella Dar. He also states that he is following antireflux measures and avoiding eating several hours prior to bedtime. In your last office note, you wrote that if patient's symptoms did not improve that we may need to consider re-evaluation by ENT and or begin patient on stronger PPI. However, patient is very reluctant to go back to ENT as he said "they did nothing" for him last time. Please advise. Follow-up by: Lamona Curl CMA Duncan Dull),  August 08, 2010 1:41 PM  Additional Follow-up for Phone Call Additional follow up Details #1::        Increase omeprazole to 40mg  by mouth two times a day before breakfast and dinner. REV if symptoms do not resolve in 2-3 months.  Additional Follow-up by: Meryl Dare MD Clementeen Graham,  August 08, 2010 4:41 PM    Additional Follow-up for Phone Call Additional follow up Details #2::    Patient advised to increase omeprazole to 40 mg two times a day and continue to follow antireflux measures. If  his symptoms do not resolve in next 2-3 months, he is to schedule  office visit with Dr Russella Dar. Patient verbalizes understanding. Follow-up by: Lamona Curl CMA Duncan Dull),  August 08, 2010 4:46 PM  New/Updated Medications: OMEPRAZOLE 40 MG CPDR (OMEPRAZOLE) Take 1 tablet by mouth two times a day (pharmacy-please d/c prescription for once daily dosing) Prescriptions: OMEPRAZOLE 40 MG CPDR (OMEPRAZOLE) Take 1 tablet by mouth two times a day (pharmacy-please d/c prescription for once daily dosing)  #60 x 5   Entered by:   Lamona Curl CMA (AAMA)   Authorized by:   Meryl Dare MD [facility]   Signed by:   Lamona Curl CMA (AAMA) on 08/08/2010   Method used:   Electronically to        [facility] [phone]* (retail)       [address]       [place], [place]  [address]       Ph: [phone]       Fax: [phone]   RxID:   [id number]

## 2010-12-17 ENCOUNTER — Telehealth: Payer: Self-pay | Admitting: Gastroenterology

## 2010-12-18 ENCOUNTER — Telehealth: Payer: Self-pay | Admitting: Nurse Practitioner

## 2010-12-19 ENCOUNTER — Ambulatory Visit: Payer: Self-pay | Admitting: Nurse Practitioner

## 2010-12-25 NOTE — Progress Notes (Signed)
Summary: Update on Appt   Phone Note Call from Patient Call back at Home Phone [phone]   Caller: Patient Call For: Dr. Russella Dar Reason for Call: Talk to Nurse Summary of Call: Pt needs to tell the nurse why he no longer needs his appt, does not just want to cancel untill he has spoken with a nurse Initial call taken by: [place] Johnson,  December 18, 2010 10:43 AM  Follow-up for Phone Call        Pt canceleld his appt with me for tomorrow 12-19-2010.  His urologist gave him perscription for Rapaflo.  He started taking it recently and developed abd cramping and pains, along with post nasal drip, bloody nose etc.  The pt read the pamplet that came with the medication and it had alot of side effects.  His main concern was the med could permanently interfere with normal sexual functions.   He decided to stop taking it.  He didn't have the abd pains until he started taking the medication.  I urged him to call us if the abd pain persists.   Follow-up by: Joselyn Glassman,  December 18, 2010 11:16 AM

## 2010-12-25 NOTE — Progress Notes (Signed)
Summary: Triage                            Phone Note Call from Patient Call back at Home Phone [phone]   Caller: Patient Call For: Dr. Russella Dar Reason for Call: Talk to Nurse Summary of Call: Abd pain and diarrhea...wants to discuss Initial call taken by: Karna Christmas,  December 17, 2010 11:43 AM  Follow-up for Phone Call        Patient calling to report that for the last 2 weeks, his "stomach hurts in the bottom and all over." Also, states he has been trying to eat right and exercise the last two weeks. Patient states the pain is above and below his belly button and it comes and goes. Diarrhea x 2 today that is watery and dark black in color. Yesterday, he had 2-3 diarrhea stools. States he has a little crampy feeling too. States he had nausea one time today "[place] States he has had sweaty episodes at night. Denies vomiting, fever or recent antibiotics.  He is taking Omperazole two times a day. Please, advise. Follow-up by: Jesse Fall RN,  December 17, 2010 12:13 PM  Additional Follow-up for Phone Call Additional follow up Details #1::        Schedule REV or work in appt with an extender. Last seen in 05/2010 and we prescribed Levsin. He can try Levsin as previously prescribed. Additional Follow-up by: Meryl Dare MD Clementeen Graham,  December 17, 2010 2:30 PM    Additional Follow-up for Phone Call Additional follow up Details #2::    Message left for patient to call back. Follow-up by: Jesse Fall RN,  December 17, 2010 3:32 PM  Additional Follow-up for Phone Call Additional follow up Details #3:: Details for Additional Follow-up Action Taken: Spoke with patient and scheduled him with Willette Cluster, RNP on 12/19/10 at 11:00 AM. Patient states he does not have any Levsin left. Rx sent to his pharmacy. Additional Follow-up by: Jesse Fall RN,  December 17, 2010 3:41 PM  Prescriptions: LEVSIN 0.125 MG TABS (HYOSCYAMINE SULFATE) 1-2 tablets by  mouth four times a day as needed for abdominal pain  #30 x 0   Entered by:   Jesse Fall RN   Authorized by:   Meryl Dare MD [facility]   Signed by:   Jesse Fall RN on 12/17/2010   Method used:   Electronically to        [facility] [phone]* (retail)       [address]       [place], [place]  [address]       Ph: [phone]       Fax: [phone]   RxID:   [id number]

## 2010-12-31 ENCOUNTER — Telehealth: Payer: Self-pay | Admitting: Gastroenterology

## 2011-01-02 ENCOUNTER — Telehealth: Payer: Self-pay | Admitting: Gastroenterology

## 2011-01-06 ENCOUNTER — Telehealth: Payer: Self-pay | Admitting: Gastroenterology

## 2011-01-09 ENCOUNTER — Telehealth: Payer: Self-pay | Admitting: Gastroenterology

## 2011-01-09 NOTE — Progress Notes (Signed)
Summary: Medication   Phone Note From Pharmacy   Caller: Bernell List  [phone] Call For: Dr. Russella Dar  Summary of Call: Needs auth. to dispense the generic Levsin med. Initial call taken by: Karna Christmas,  January 02, 2011 10:03 AM  Follow-up for Phone Call        Informed pharmacy that generic is ok. Follow-up by: Christie Nottingham CMA (AAMA),  January 02, 2011 10:22 AM

## 2011-01-09 NOTE — Progress Notes (Signed)
Summary: med ?   Phone Note Call from Patient Call back at [facility] Phone [phone]   Caller: Patient Call For: Dr Russella Dar Reason for Call: Talk to Nurse Summary of Call: Patient wants to speak to nurse regarding meds givent o him by Dr Russella Dar. Initial call taken by: Tawni Levy,  December 31, 2010 10:34 AM  Follow-up for Phone Call        Pt wanted a refill on his prostate medication. I informed pt that we do not prescribe his prostate medication. Pt states he made a mistake and called the wrong doctors office.  Follow-up by: Christie Nottingham CMA Duncan Dull),  December 31, 2010 10:54 AM

## 2011-01-13 LAB — DIFFERENTIAL
Basophils Absolute: 0 10*3/uL (ref 0.0–0.1)
Basophils Relative: 1 % (ref 0–1)
Eosinophils Absolute: 0 10*3/uL (ref 0.0–0.7)
Eosinophils Relative: 0 % (ref 0–5)
Lymphocytes Relative: 42 % (ref 12–46)
Lymphs Abs: 1.1 10*3/uL (ref 0.7–4.0)
Monocytes Absolute: 0.2 10*3/uL (ref 0.1–1.0)
Monocytes Relative: 8 % (ref 3–12)
Neutro Abs: 1.3 10*3/uL — ABNORMAL LOW (ref 1.7–7.7)
Neutrophils Relative %: 48 % (ref 43–77)

## 2011-01-13 LAB — POCT I-STAT, CHEM 8
BUN: 13 mg/dL (ref 6–23)
Calcium, Ion: 1.03 mmol/L — ABNORMAL LOW (ref 1.12–1.32)
Chloride: 107 mEq/L (ref 96–112)
Creatinine, Ser: 1.8 mg/dL — ABNORMAL HIGH (ref 0.4–1.5)
Glucose, Bld: 112 mg/dL — ABNORMAL HIGH (ref 70–99)
HCT: 44 % (ref 39.0–52.0)
Hemoglobin: 15 g/dL (ref 13.0–17.0)
Potassium: 3.8 mEq/L (ref 3.5–5.1)
Sodium: 142 mEq/L (ref 135–145)
TCO2: 21 mmol/L (ref 0–100)

## 2011-01-13 LAB — URINALYSIS, ROUTINE W REFLEX MICROSCOPIC
Bilirubin Urine: NEGATIVE
Glucose, UA: NEGATIVE mg/dL
Hgb urine dipstick: NEGATIVE
Ketones, ur: NEGATIVE mg/dL
Nitrite: NEGATIVE
Protein, ur: NEGATIVE mg/dL
Specific Gravity, Urine: 1.022 (ref 1.005–1.030)
Urobilinogen, UA: 0.2 mg/dL (ref 0.0–1.0)
pH: 5 (ref 5.0–8.0)

## 2011-01-13 LAB — CBC
HCT: 40 % (ref 39.0–52.0)
Hemoglobin: 13.8 g/dL (ref 13.0–17.0)
MCH: 32.1 pg (ref 26.0–34.0)
MCHC: 34.5 g/dL (ref 30.0–36.0)
MCV: 93 fL (ref 78.0–100.0)
Platelets: 182 10*3/uL (ref 150–400)
RBC: 4.3 MIL/uL (ref 4.22–5.81)
RDW: 14.6 % (ref 11.5–15.5)
WBC: 2.7 10*3/uL — ABNORMAL LOW (ref 4.0–10.5)

## 2011-01-14 ENCOUNTER — Telehealth: Payer: Self-pay | Admitting: Gastroenterology

## 2011-01-14 NOTE — Progress Notes (Signed)
Summary: resend rx   Phone Note Call from Patient Call back at Home Phone [phone]   Caller: Patient Call For: Dr Russella Dar Reason for Call: Refill Medication, Talk to Nurse Summary of Call: Patient states that his rx is not in the pharmacy please resend. Initial call taken by: Tawni Levy,  January 09, 2011 1:59 PM  Follow-up for Phone Call        [facility] rx is there and on hold they will fill now. pt aware Follow-up by: Harlow Mares CMA (AAMA),  January 09, 2011 2:15 PM

## 2011-01-14 NOTE — Progress Notes (Signed)
Summary: refill  Medications Added OMEPRAZOLE 40 MG CPDR (OMEPRAZOLE) Take 1 tablet by mouth two times a day       Phone Note Call from Patient Call back at [facility] Phone [phone]   Caller: Patient Call For: Dr. Russella Dar Reason for Call: Refill Medication Summary of Call: Patient needs refils for his Omeprazole. Initial call taken by: Tawni Levy,  January 06, 2011 1:09 PM  Follow-up for Phone Call        Rx was sent to pts pharmacy.  Follow-up by: Christie Nottingham CMA Duncan Dull),  January 06, 2011 2:54 PM    New/Updated Medications: OMEPRAZOLE 40 MG CPDR (OMEPRAZOLE) Take 1 tablet by mouth two times a day Prescriptions: OMEPRAZOLE 40 MG CPDR (OMEPRAZOLE) Take 1 tablet by mouth two times a day  #60 x 2   Entered by:   Christie Nottingham CMA (AAMA)   Authorized by:   Meryl Dare MD [facility]   Signed by:   Christie Nottingham CMA Duncan Dull) on 01/06/2011   Method used:   Electronically to        [facility] [phone]* (retail)       [address]       [place], [place]  [address]       Ph: [phone]       Fax: [phone]   RxID:   [id number]

## 2011-01-20 ENCOUNTER — Telehealth: Payer: Self-pay | Admitting: Gastroenterology

## 2011-01-20 ENCOUNTER — Encounter: Payer: Self-pay | Admitting: Gastroenterology

## 2011-01-20 ENCOUNTER — Ambulatory Visit (INDEPENDENT_AMBULATORY_CARE_PROVIDER_SITE_OTHER): Payer: PRIVATE HEALTH INSURANCE | Admitting: Gastroenterology

## 2011-01-20 DIAGNOSIS — R109 Unspecified abdominal pain: Secondary | ICD-10-CM

## 2011-01-20 DIAGNOSIS — Z860100 Personal history of colon polyps, unspecified: Secondary | ICD-10-CM | POA: Insufficient documentation

## 2011-01-20 DIAGNOSIS — K219 Gastro-esophageal reflux disease without esophagitis: Secondary | ICD-10-CM

## 2011-01-21 NOTE — Progress Notes (Signed)
Summary: Triage   Phone Note Call from Patient Call back at Home Phone [phone]   Caller: Patient Call For: Dr. Russella Dar Reason for Call: Talk to Nurse Summary of Call: Patient said he is having pain in the bottom of his stomach and wants to speak with nurse Initial call taken by: [place] Johnson,  January 14, 2011 2:58 PM  Follow-up for Phone Call        Left message for patient to call back Darcey Nora RN, [facility]  January 14, 2011 4:36 PM  abdominal pain.  Patient will come in and see Dr Russella Dar 01/20/11 Follow-up by: Darcey Nora RN, CGRN,  January 15, 2011 11:07 AM

## 2011-01-22 LAB — CK TOTAL AND CKMB (NOT AT ARMC)
Relative Index: 0.8 (ref 0.0–2.5)
Total CK: 223 U/L (ref 7–232)

## 2011-01-22 LAB — COMPREHENSIVE METABOLIC PANEL
BUN: 12 mg/dL (ref 6–23)
CO2: 19 mEq/L (ref 19–32)
Calcium: 9.8 mg/dL (ref 8.4–10.5)
Creatinine, Ser: 0.99 mg/dL (ref 0.4–1.5)
GFR calc Af Amer: >60 mL/min (ref >60)
GFR calc non Af Amer: >60 mL/min (ref >60)
Glucose, Bld: 120 mg/dL — ABNORMAL HIGH (ref 70–99)

## 2011-01-22 LAB — CBC
HCT: 43.8 % (ref 39.0–52.0)
Hemoglobin: 15.4 g/dL (ref 13.0–17.0)
MCHC: 35.2 g/dL (ref 30.0–36.0)
MCV: 94.8 fL (ref 78.0–100.0)
RBC: 4.62 MIL/uL (ref 4.22–5.81)

## 2011-01-22 LAB — DIFFERENTIAL
Basophils Absolute: 0 10*3/uL (ref 0.0–0.1)
Lymphocytes Relative: 25 % (ref 12–46)
Lymphs Abs: 1 10*3/uL (ref 0.7–4.0)
Neutro Abs: 2.9 10*3/uL (ref 1.7–7.7)
Neutrophils Relative %: 70 % (ref 43–77)

## 2011-01-22 LAB — TROPONIN I: Troponin I: <0.01 ng/mL (ref 0.00–0.06)

## 2011-01-22 LAB — RAPID URINE DRUG SCREEN, HOSP PERFORMED: Tetrahydrocannabinol: NOT DETECTED

## 2011-01-22 LAB — LIPASE, BLOOD: Lipase: 49 U/L (ref 11–59)

## 2011-01-23 ENCOUNTER — Telehealth: Payer: Self-pay | Admitting: Gastroenterology

## 2011-01-23 MED ORDER — DICYCLOMINE HCL 10 MG PO CAPS: 10.0000 mg | ORAL_CAPSULE | Freq: Four times a day (QID) | ORAL | Status: DC | PRN

## 2011-01-23 NOTE — Telephone Encounter (Signed)
Spoke with patient and he states his medicine is 45 dollars and he is on a fixed income. Told pt that we can try to send something else to his pharmacy to see if it is cheaper. Sent in Bentyl to his pharmacy.

## 2011-01-24 LAB — COMPREHENSIVE METABOLIC PANEL
ALT: 21 U/L (ref 0–53)
Albumin: 4.6 g/dL (ref 3.5–5.2)
Alkaline Phosphatase: 30 U/L — ABNORMAL LOW (ref 39–117)
BUN: 7 mg/dL (ref 6–23)
Chloride: 105 mEq/L (ref 96–112)
Glucose, Bld: 116 mg/dL — ABNORMAL HIGH (ref 70–99)
Potassium: 3.3 mEq/L — ABNORMAL LOW (ref 3.5–5.1)
Sodium: 142 mEq/L (ref 135–145)
Total Bilirubin: 0.6 mg/dL (ref 0.3–1.2)
Total Protein: 8.1 g/dL (ref 6.0–8.3)

## 2011-01-24 LAB — URINALYSIS, ROUTINE W REFLEX MICROSCOPIC
Bilirubin Urine: NEGATIVE
Glucose, UA: NEGATIVE mg/dL
Hgb urine dipstick: NEGATIVE
Ketones, ur: NEGATIVE mg/dL
Protein, ur: NEGATIVE mg/dL
Urobilinogen, UA: 1 mg/dL (ref 0.0–1.0)

## 2011-01-24 LAB — DIFFERENTIAL
Basophils Absolute: 0 10*3/uL (ref 0.0–0.1)
Basophils Relative: 1 % (ref 0–1)
Eosinophils Absolute: 0 10*3/uL (ref 0.0–0.7)
Monocytes Absolute: 0.2 10*3/uL (ref 0.1–1.0)
Monocytes Relative: 6 % (ref 3–12)
Neutro Abs: 2.2 10*3/uL (ref 1.7–7.7)

## 2011-01-24 LAB — CBC
HCT: 45.4 % (ref 39.0–52.0)
Hemoglobin: 15 g/dL (ref 13.0–17.0)
Platelets: 298 10*3/uL (ref 150–400)
WBC: 4.1 10*3/uL (ref 4.0–10.5)

## 2011-01-24 LAB — ETHANOL: Alcohol, Ethyl (B): 296 mg/dL — ABNORMAL HIGH (ref 0–10)

## 2011-01-24 LAB — URINE CULTURE: Culture: NO GROWTH

## 2011-01-30 NOTE — Assessment & Plan Note (Signed)
Summary: abdominal pain   History of Present Illness Visit Type: Follow-up Visit Primary GI MD: Elie Goody MD [facility] Primary Provider: NA Chief Complaint: abdominal pain  History of Present Illness:    [name] complains of abdominal bloating and lower abdominal pain. It is difficult to obtain a detailed history. His symptoms are mild and intermittent.  He mainly localizes the symptoms to his suprapubic area. His current symptoms are similar to his symptoms from last summer and he has had prior extensive evaluation. He thinks that Levsin may have alleviated his symptoms however it caused other side effects such as dizziness and a headache. On further questioning he is not sure which medication caused dizziness and headache but it does not appear that he has been on anti-spasmodic for several months. He no longer has a primary physician.   GI Review of Systems    Reports abdominal pain and  bloating.     Location of  Abdominal pain: lower abdomen.    Denies acid reflux, belching, chest pain, dysphagia with liquids, dysphagia with solids, heartburn, loss of appetite, nausea, vomiting, vomiting blood, weight loss, and  weight gain.        Denies anal fissure, black tarry stools, change in bowel habit, constipation, diarrhea, diverticulosis, fecal incontinence, heme positive stool, hemorrhoids, irritable bowel syndrome, jaundice, light color stool, liver problems, rectal bleeding, and  rectal pain.   Current Medications (verified): 1)  Norvasc 5 Mg Tabs (Amlodipine Besylate) .... Take 1 Tablet By Mouth Once A Day 2)  Omeprazole 40 Mg Cpdr (Omeprazole) .... Take 1 Tablet By Mouth Two Times A Day  Allergies (verified): No Known Drug Allergies  Past History:  Past Medical History: Reviewed history from 05/15/2010 and no changes required. Hypertension H/O Alcohol Abuse  Leukopenia Hiatal Hernia BPH Gastritis  Barrett's Esophagus without dysplasia, 08/2006 Internal  Hemorrhoids GERD Colon Polyps, adenomatous 01/2009 Fatty Liver Hepatic hemangioma  Past Surgical History: Reviewed history from 04/27/2007 and no changes required. Cataract Surgery, Left Eye  Family History: Family History of Diabetes: Father (deceased), 2 brothers, sister No FH of Colon Cancer:  Social History: Reviewed history from 05/15/2010 and no changes required. Retired 8 children  Heavy drinker since age 12--Alcohol Use - no Quit  ~10/2009 Patient is a former smoker.  Illicit Drug Use - no  Vital Signs:  Patient profile:   70 year old male Height:      71 inches Weight:      187 pounds BMI:     26.18 Pulse rate:   72 / minute Pulse rhythm:   regular BP sitting:   140 / 80  (left arm)  Vitals Entered By: Milford Cage NCMA (January 20, 2011 11:10 AM)  Physical Exam  General:  Well developed, well nourished, no acute distress. Head:  Normocephalic and atraumatic. Eyes:  PERRLA, no icterus. Mouth:  No deformity or lesions, dentition normal. Lungs:  Clear throughout to auscultation. Heart:  Regular rate and rhythm; no murmurs, rubs,  or bruits. Abdomen:  Soft, nontender and nondistended. No masses, hepatosplenomegaly or hernias noted. Normal bowel sounds. Psych:  Alert and cooperative. Normal mood and affect.  Impression & Recommendations:  Problem # 1:  ABDOMINAL PAIN OTHER SPECIFIED SITE (ICD-789.09)  Nonspecific mild lower abdominal pain and bloating. Abdominal ultrasound, CT scan and colonoscopy, all performed in 2010, were unremarkable and there are no significant findings on physical examination today. He is advised to call us later today the exact names on the prescription bottles of  the medications that caused side effects. Trial of an anti-spasmodic and high-fiber diet.  I advised him to obtain a primary care physician.  Problem # 2:  BARRETT'S ESOPHAGUS (ICD-530.85)  Continue omeprazole 40 mg twice a day along with all standard antibiotics measures.   Surveillance EGD 08/2012.  Problem # 3:  PERSONAL HX COLONIC POLYPS (ICD-V12.72)  History of adenomatous colon polyps in March 2010. Surveillance colonoscopy March 2015.  Patient Instructions: 1)  Please call me back when you find out which medications you had side effects from so we can decide which medications to prescribe in the future.  2)  We will schedule you for a appointment with [facility].  3)  The medication list was reviewed and reconciled.  All changed / newly prescribed medications were explained.  A complete medication list was provided to the patient / caregiver.

## 2011-01-30 NOTE — Progress Notes (Signed)
Summary: New Rx  Medications Added HYOSCYAMINE SULFATE 0.125 MG TABS (HYOSCYAMINE SULFATE) 1-2 tablets by mouth four times a day as needed       Phone Note Other Incoming Call back at Home Phone [phone] P PH     Caller: Patient Summary of Call: Patient called stating the only medication he remembered having some side effects with was Dexilant but cannot remember which side effects he had. Per Dr. Russella Dar send in Levsin for pt to take 1-2 tablets by mouth four times a day as needed. Informed pt that we sent in a presciption for him and pt verbalized understanding. Initial call taken by: Christie Nottingham CMA Duncan Dull),  January 20, 2011 2:40 PM    New/Updated Medications: HYOSCYAMINE SULFATE 0.125 MG TABS (HYOSCYAMINE SULFATE) 1-2 tablets by mouth four times a day as needed Prescriptions: HYOSCYAMINE SULFATE 0.125 MG TABS (HYOSCYAMINE SULFATE) 1-2 tablets by mouth four times a day as needed  #100 x 11   Entered by:   Christie Nottingham CMA (AAMA)   Authorized by:   Meryl Dare MD [facility]   Signed by:   Christie Nottingham CMA (AAMA) on 01/20/2011   Method used:   Electronically to        [facility] [phone]* (retail)       [address]       [place], [place]  [address]       Ph: [phone]       Fax: [phone]   RxID:   [id number]

## 2011-02-09 LAB — TROPONIN I: Troponin I: 0.01 ng/mL (ref 0.00–0.06)

## 2011-02-09 LAB — CBC
HCT: 37.5 % — ABNORMAL LOW (ref 39.0–52.0)
HCT: 43.7 % (ref 39.0–52.0)
Hemoglobin: 15 g/dL (ref 13.0–17.0)
MCHC: 35.4 g/dL (ref 30.0–36.0)
MCV: 94.2 fL (ref 78.0–100.0)
Platelets: 121 10*3/uL — ABNORMAL LOW (ref 150–400)
RBC: 3.98 MIL/uL — ABNORMAL LOW (ref 4.22–5.81)
RBC: 4.6 MIL/uL (ref 4.22–5.81)
RDW: 15.7 % — ABNORMAL HIGH (ref 11.5–15.5)
WBC: 3.3 10*3/uL — ABNORMAL LOW (ref 4.0–10.5)
WBC: 3.4 10*3/uL — ABNORMAL LOW (ref 4.0–10.5)

## 2011-02-09 LAB — DIFFERENTIAL
Basophils Absolute: 0 10*3/uL (ref 0.0–0.1)
Eosinophils Absolute: 0 10*3/uL (ref 0.0–0.7)
Eosinophils Relative: 0 % (ref 0–5)
Eosinophils Relative: 0 % (ref 0–5)
Lymphocytes Relative: 35 % (ref 12–46)
Lymphs Abs: 1.1 10*3/uL (ref 0.7–4.0)
Lymphs Abs: 0.8 10*3/uL (ref 0.7–4.0)
Monocytes Absolute: 0.2 10*3/uL (ref 0.1–1.0)
Monocytes Relative: 10 % (ref 3–12)
Neutro Abs: 1.9 10*3/uL (ref 1.7–7.7)

## 2011-02-09 LAB — POCT I-STAT, CHEM 8
BUN: 14 mg/dL (ref 6–23)
Chloride: 104 mEq/L (ref 96–112)
HCT: 45 % (ref 39.0–52.0)
Potassium: 3.1 mEq/L — ABNORMAL LOW (ref 3.5–5.1)

## 2011-02-09 LAB — POCT I-STAT 3, VENOUS BLOOD GAS (G3P V)
Bicarbonate: 19.6 mEq/L — ABNORMAL LOW (ref 20.0–24.0)
TCO2: 21 mmol/L (ref 0–100)
pCO2, Ven: 43.2 mmHg — ABNORMAL LOW (ref 45.0–50.0)
pH, Ven: 7.265 (ref 7.250–7.300)
pO2, Ven: 37 mmHg (ref 30.0–45.0)

## 2011-02-09 LAB — LACTIC ACID, PLASMA
Lactic Acid, Venous: 7.3 mmol/L — ABNORMAL HIGH (ref 0.5–2.2)
Lactic Acid, Venous: 9.8 mmol/L — ABNORMAL HIGH (ref 0.5–2.2)

## 2011-02-09 LAB — URINALYSIS, ROUTINE W REFLEX MICROSCOPIC
Glucose, UA: NEGATIVE mg/dL
Hgb urine dipstick: NEGATIVE
Specific Gravity, Urine: 1.039 — ABNORMAL HIGH (ref 1.005–1.030)
Urobilinogen, UA: 0.2 mg/dL (ref 0.0–1.0)
pH: 5.5 (ref 5.0–8.0)

## 2011-02-09 LAB — HEPATIC FUNCTION PANEL
ALT: 22 U/L (ref 0–53)
AST: 50 U/L — ABNORMAL HIGH (ref 0–37)
Alkaline Phosphatase: 32 U/L — ABNORMAL LOW (ref 39–117)
Bilirubin, Direct: 0.3 mg/dL (ref 0.0–0.3)

## 2011-02-09 LAB — BASIC METABOLIC PANEL
BUN: 8 mg/dL (ref 6–23)
CO2: 24 mEq/L (ref 19–32)
Chloride: 101 mEq/L (ref 96–112)
Potassium: 4 mEq/L (ref 3.5–5.1)

## 2011-02-09 LAB — COMPREHENSIVE METABOLIC PANEL
ALT: 19 U/L (ref 0–53)
AST: 47 U/L — ABNORMAL HIGH (ref 0–37)
Albumin: 3.7 g/dL (ref 3.5–5.2)
Calcium: 8.8 mg/dL (ref 8.4–10.5)
GFR calc Af Amer: >60 mL/min (ref >60)
Sodium: 136 mEq/L (ref 135–145)
Total Protein: 6.7 g/dL (ref 6.0–8.3)

## 2011-02-09 LAB — CARDIAC PANEL(CRET KIN+CKTOT+MB+TROPI)
CK, MB: 3.1 ng/mL (ref 0.3–4.0)
Relative Index: 0.2 (ref 0.0–2.5)
Total CK: 1532 U/L — ABNORMAL HIGH (ref 7–232)

## 2011-02-09 LAB — CULTURE, BLOOD (ROUTINE X 2): Culture: NO GROWTH

## 2011-02-09 LAB — CK TOTAL AND CKMB (NOT AT ARMC)
CK, MB: 3.2 ng/mL (ref 0.3–4.0)
Total CK: 1278 U/L — ABNORMAL HIGH (ref 7–232)

## 2011-02-09 LAB — LIPASE, BLOOD: Lipase: 58 U/L (ref 11–59)

## 2011-02-10 ENCOUNTER — Ambulatory Visit (HOSPITAL_COMMUNITY)
Admission: EM | Admit: 2011-02-10 | Discharge: 2011-02-10 | Disposition: A | Discharge status: Home / Self Care | Payer: PRIVATE HEALTH INSURANCE | Attending: Internal Medicine | Admitting: Internal Medicine

## 2011-02-10 ENCOUNTER — Other Ambulatory Visit: Payer: Self-pay | Admitting: Gastroenterology

## 2011-02-10 ENCOUNTER — Emergency Department (HOSPITAL_COMMUNITY): Payer: PRIVATE HEALTH INSURANCE

## 2011-02-10 DIAGNOSIS — Z01812 Encounter for preprocedural laboratory examination: Secondary | ICD-10-CM | POA: Insufficient documentation

## 2011-02-10 DIAGNOSIS — K922 Gastrointestinal hemorrhage, unspecified: Secondary | ICD-10-CM

## 2011-02-10 DIAGNOSIS — K2980 Duodenitis without bleeding: Secondary | ICD-10-CM

## 2011-02-10 DIAGNOSIS — R1032 Left lower quadrant pain: Secondary | ICD-10-CM | POA: Insufficient documentation

## 2011-02-10 DIAGNOSIS — R195 Other fecal abnormalities: Secondary | ICD-10-CM

## 2011-02-10 DIAGNOSIS — D649 Anemia, unspecified: Secondary | ICD-10-CM | POA: Insufficient documentation

## 2011-02-10 DIAGNOSIS — R509 Fever, unspecified: Secondary | ICD-10-CM | POA: Insufficient documentation

## 2011-02-10 DIAGNOSIS — K5900 Constipation, unspecified: Secondary | ICD-10-CM | POA: Insufficient documentation

## 2011-02-10 DIAGNOSIS — R197 Diarrhea, unspecified: Secondary | ICD-10-CM | POA: Insufficient documentation

## 2011-02-10 DIAGNOSIS — K921 Melena: Secondary | ICD-10-CM | POA: Insufficient documentation

## 2011-02-10 DIAGNOSIS — D62 Acute posthemorrhagic anemia: Secondary | ICD-10-CM

## 2011-02-10 LAB — DIFFERENTIAL
Basophils Relative: 0 % (ref 0–1)
Eosinophils Absolute: 0 10*3/uL (ref 0.0–0.7)
Eosinophils Relative: 0 % (ref 0–5)
Lymphocytes Relative: 33 % (ref 12–46)
Lymphs Abs: 1 10*3/uL (ref 0.7–4.0)
Monocytes Absolute: 0.4 10*3/uL (ref 0.1–1.0)
Neutrophils Relative %: 55 % (ref 43–77)

## 2011-02-10 LAB — URINALYSIS, ROUTINE W REFLEX MICROSCOPIC
Ketones, ur: NEGATIVE mg/dL
Nitrite: NEGATIVE
Protein, ur: NEGATIVE mg/dL
Urobilinogen, UA: 1 mg/dL (ref 0.0–1.0)
pH: 6 (ref 5.0–8.0)

## 2011-02-10 LAB — COMPREHENSIVE METABOLIC PANEL
Alkaline Phosphatase: 22 U/L — ABNORMAL LOW (ref 39–117)
BUN: 16 mg/dL (ref 6–23)
GFR calc non Af Amer: >60 mL/min (ref >60)
Glucose, Bld: 80 mg/dL (ref 70–99)
Potassium: 3.9 mEq/L (ref 3.5–5.1)
Total Bilirubin: 0.7 mg/dL (ref 0.3–1.2)
Total Protein: 6.7 g/dL (ref 6.0–8.3)

## 2011-02-10 LAB — CBC
HCT: 28.6 % — ABNORMAL LOW (ref 39.0–52.0)
MCV: 92.3 fL (ref 78.0–100.0)
Platelets: 164 10*3/uL (ref 150–400)
RBC: 3.27 MIL/uL — ABNORMAL LOW (ref 4.22–5.81)
RDW: 14 % (ref 11.5–15.5)
WBC: 3.1 10*3/uL — ABNORMAL LOW (ref 4.0–10.5)
WBC: 5 10*3/uL (ref 4.0–10.5)

## 2011-02-10 LAB — OCCULT BLOOD, POC DEVICE: Fecal Occult Bld: NEGATIVE

## 2011-02-10 LAB — LIPASE, BLOOD: Lipase: 58 U/L (ref 11–59)

## 2011-02-10 MED ORDER — IOHEXOL 300 MG/ML  SOLN
100.0000 mL | Freq: Once | INTRAMUSCULAR | Status: AC | PRN
  Administered 2011-02-10: 100 mL via INTRAVENOUS

## 2011-02-11 ENCOUNTER — Telehealth: Payer: Self-pay | Admitting: Gastroenterology

## 2011-02-11 DIAGNOSIS — R195 Other fecal abnormalities: Secondary | ICD-10-CM

## 2011-02-11 NOTE — Telephone Encounter (Signed)
Both phone numbers for the patient are non working numbers the [phone] "can't be completed as dialed" and the [phone] " is not accepting calls at this time."  I will await a return call from the patient .

## 2011-02-13 LAB — URINALYSIS, ROUTINE W REFLEX MICROSCOPIC
Bilirubin Urine: NEGATIVE
Glucose, UA: NEGATIVE mg/dL
Hgb urine dipstick: NEGATIVE
Ketones, ur: NEGATIVE mg/dL
Nitrite: NEGATIVE
Protein, ur: NEGATIVE mg/dL
Specific Gravity, Urine: 1.019 (ref 1.005–1.030)
Urobilinogen, UA: 1 mg/dL (ref 0.0–1.0)
pH: 6 (ref 5.0–8.0)

## 2011-02-13 LAB — COMPREHENSIVE METABOLIC PANEL
ALT: 14 U/L (ref 0–53)
Albumin: 3.9 g/dL (ref 3.5–5.2)
Alkaline Phosphatase: 21 U/L — ABNORMAL LOW (ref 39–117)
BUN: 14 mg/dL (ref 6–23)
Chloride: 106 mEq/L (ref 96–112)
Glucose, Bld: 106 mg/dL — ABNORMAL HIGH (ref 70–99)
Potassium: 4.3 mEq/L (ref 3.5–5.1)
Total Bilirubin: 0.7 mg/dL (ref 0.3–1.2)

## 2011-02-13 LAB — DIFFERENTIAL
Basophils Absolute: 0 10*3/uL (ref 0.0–0.1)
Basophils Relative: 1 % (ref 0–1)
Eosinophils Absolute: 0 10*3/uL (ref 0.0–0.7)
Monocytes Absolute: 0.4 10*3/uL (ref 0.1–1.0)
Neutro Abs: 2.1 10*3/uL (ref 1.7–7.7)

## 2011-02-13 LAB — LIPASE, BLOOD: Lipase: 36 U/L (ref 11–59)

## 2011-02-13 LAB — CBC
HCT: 38.8 % — ABNORMAL LOW (ref 39.0–52.0)
Hemoglobin: 13.8 g/dL (ref 13.0–17.0)
Platelets: 260 10*3/uL (ref 150–400)
WBC: 3.2 10*3/uL — ABNORMAL LOW (ref 4.0–10.5)

## 2011-02-13 NOTE — Telephone Encounter (Signed)
Patient needs a propofol colon hopefully on 02/19/11 with Dr Russella Dar, he also needs lab work.  His phone is still not working I have mailed him a letter asking him to contact our office asap to schedule.  Labs have been entered.

## 2011-02-17 ENCOUNTER — Ambulatory Visit (AMBULATORY_SURGERY_CENTER): Payer: PRIVATE HEALTH INSURANCE | Admitting: *Deleted

## 2011-02-17 ENCOUNTER — Other Ambulatory Visit (INDEPENDENT_AMBULATORY_CARE_PROVIDER_SITE_OTHER): Payer: PRIVATE HEALTH INSURANCE

## 2011-02-17 ENCOUNTER — Telehealth: Payer: Self-pay | Admitting: Gastroenterology

## 2011-02-17 VITALS — Ht 71.0 in | Wt 182.3 lb

## 2011-02-17 DIAGNOSIS — Z79899 Other long term (current) drug therapy: Secondary | ICD-10-CM

## 2011-02-17 DIAGNOSIS — K921 Melena: Secondary | ICD-10-CM

## 2011-02-17 DIAGNOSIS — R195 Other fecal abnormalities: Secondary | ICD-10-CM

## 2011-02-17 LAB — IRON: Iron: 119 ug/dL (ref 42–165)

## 2011-02-17 LAB — FOLATE: Folate: 15.3 ng/mL (ref >5.9)

## 2011-02-17 LAB — FERRITIN: Ferritin: 91.8 ng/mL (ref 22.0–322.0)

## 2011-02-17 LAB — VITAMIN B12: Vitamin B-12: 419 pg/mL (ref 211–911)

## 2011-02-17 MED ORDER — PEG-KCL-NACL-NASULF-NA ASC-C 100 G PO SOLR: ORAL | Status: DC

## 2011-02-17 NOTE — Telephone Encounter (Signed)
Pt is scheduled for colonoscopy 4/18.  [facility] [address] will not have moviprep in time for procedure.  Prep called [facility] to [facility] [address].  Pt contacted.

## 2011-02-17 NOTE — Telephone Encounter (Signed)
Patient did call back today after getting my letter.  He will come today for lab work and a pre-visit at 10:30 for colon with propofol on 02/19/11 2:00 in the [facility].

## 2011-02-18 ENCOUNTER — Telehealth: Payer: Self-pay | Admitting: Gastroenterology

## 2011-02-18 ENCOUNTER — Encounter: Payer: Self-pay | Admitting: Gastroenterology

## 2011-02-18 LAB — POCT URINALYSIS DIP (DEVICE)
Bilirubin Urine: NEGATIVE
Glucose, UA: NEGATIVE mg/dL
Nitrite: NEGATIVE
Urobilinogen, UA: 0.2 mg/dL (ref 0.0–1.0)

## 2011-02-18 NOTE — Telephone Encounter (Signed)
Left message to return call. Also because the patient ate that its okay but do not eat any more food and proceed with prep.

## 2011-02-19 ENCOUNTER — Ambulatory Visit (AMBULATORY_SURGERY_CENTER): Payer: PRIVATE HEALTH INSURANCE | Admitting: Gastroenterology

## 2011-02-19 ENCOUNTER — Encounter: Payer: Self-pay | Admitting: Gastroenterology

## 2011-02-19 VITALS — BP 130/90 | HR 75 | Temp 97.2°F | Resp 16 | Ht 70.0 in | Wt 182.0 lb

## 2011-02-19 DIAGNOSIS — K921 Melena: Secondary | ICD-10-CM

## 2011-02-19 DIAGNOSIS — D126 Benign neoplasm of colon, unspecified: Secondary | ICD-10-CM

## 2011-02-19 DIAGNOSIS — Z860100 Personal history of colon polyps, unspecified: Secondary | ICD-10-CM

## 2011-02-19 DIAGNOSIS — D649 Anemia, unspecified: Secondary | ICD-10-CM

## 2011-02-19 DIAGNOSIS — R195 Other fecal abnormalities: Secondary | ICD-10-CM

## 2011-02-19 MED ORDER — SODIUM CHLORIDE 0.9 % IV SOLN: 500.0000 mL | INTRAVENOUS | Status: DC

## 2011-02-19 NOTE — Patient Instructions (Signed)
Findings:  Polyps, Internal Hemorrhoids  Recommendations:  Repeat exam in 5 years.                                    Schedule capsule Endoscopy - office will call with appointment.

## 2011-02-20 ENCOUNTER — Telehealth: Payer: Self-pay

## 2011-02-20 ENCOUNTER — Telehealth: Payer: Self-pay | Admitting: Gastroenterology

## 2011-02-20 NOTE — Telephone Encounter (Signed)
Discussed questions concerning procedure yesterday. Questions answered.

## 2011-02-20 NOTE — Telephone Encounter (Signed)

## 2011-02-24 ENCOUNTER — Encounter: Payer: Self-pay | Admitting: Gastroenterology

## 2011-02-27 ENCOUNTER — Telehealth: Payer: Self-pay | Admitting: Gastroenterology

## 2011-02-27 DIAGNOSIS — D649 Anemia, unspecified: Secondary | ICD-10-CM

## 2011-02-27 NOTE — Telephone Encounter (Signed)
I have reviewed letter with the patient and all his questions were answered.  He is also scheduled for a capsule endoscopy for 03/11/11 8:00.  I have thoroughly explained and  reviewed all instructions and the procedure with him over the phone he was unable to get a ride here for a capsule pre-visit. I will mail him the paper instructions also

## 2011-03-05 ENCOUNTER — Telehealth: Payer: Self-pay | Admitting: Gastroenterology

## 2011-03-05 NOTE — Telephone Encounter (Signed)
Pt states he needs a prescription for the laxative he has to drink for the Capsule Endoscopy. I told him to read his instructions and to purchase the Miralax over the counter at his pharmacy. Answered questions about Capsule preperation and told him to call back if he has any further questions. Pt agreed and verbalized understanding.

## 2011-03-06 ENCOUNTER — Telehealth: Payer: Self-pay | Admitting: Gastroenterology

## 2011-03-06 NOTE — Telephone Encounter (Signed)
Left a message for pt to return my call. 

## 2011-03-07 NOTE — Telephone Encounter (Signed)
Questions answered regarding taking his medications atleast 2 hours prior to the Capsule Endoscopy. Pt agreed and verbalized understanding.

## 2011-03-10 ENCOUNTER — Telehealth: Payer: Self-pay | Admitting: Gastroenterology

## 2011-03-10 NOTE — Telephone Encounter (Signed)
Left message for patient to call back  

## 2011-03-11 NOTE — Telephone Encounter (Signed)
Patient is rescheduled for 03/20/11 8:00

## 2011-03-18 NOTE — Op Note (Signed)
NAME:  Kenneth Roberts, Kenneth Roberts NO.:  [id number]   MEDICAL RECORD NO.:  [id number]          PATIENT TYPE:  OUT   LOCATION:  ENDO                         FACILITY[facility]   PHYSICIAN:  Shirley Friar, MDDATE OF BIRTH:  03-Jun-1941   DATE OF PROCEDURE:  01/19/2009  DATE OF DISCHARGE:                               OPERATIVE REPORT   PROCEDURE:  Upper endoscopy.   INDICATIONS:  Heartburn.   MEDICATIONS:  Fentanyl 25 mcg IV, Versed 3 mg IV, additional medicine  given for preceding colonoscopy.   FINDINGS:  Endoscope was inserted through the oropharynx and esophagus  was intubated, which was normal in its entirety.  Endoscope was passed  down to the stomach, which revealed some scattered erythematous mucosa  with two erosions in the antrum noted as well.  Retroflexion was done  which revealed normal proximal stomach.  Scope was straightened and  advanced to the duodenal bulb and second portion of duodenum, which were  both normal.  The scope was drawn back into the stomach and a biopsy in  the antrum was taken for histology purposes.   The endoscope was withdrawn to confirm the above findings.   ASSESSMENT:  1. Distal gastric erosions and erythema consistent with gastritis,      status post biopsy.  2. Otherwise normal upper endoscopy.   PLAN:  Follow up on biopsy to check histology and treat, if H pylori is  noted.      Shirley Friar, MD  Electronically Signed     VCS/MEDQ  D:  01/19/2009  T:  01/19/2009  Job:  [id number]

## 2011-03-18 NOTE — H&P (Signed)
NAME:  Kenneth Roberts, Kenneth Roberts             ACCOUNT NO.:  [id number]   MEDICAL RECORD NO.:  [id number]          PATIENT TYPE:  INP   LOCATION:  3707                         FACILITY:  [facility]   PHYSICIAN:  Hind I Elsaid, MD      DATE OF BIRTH:  22-Nov-1940   DATE OF ADMISSION:  05/03/2009  DATE OF DISCHARGE:                              HISTORY & PHYSICAL   CHIEF COMPLAINT:  Abdominal pain.   HISTORY OF PRESENT ILLNESS:  This is a 70 year old African American male  with a past medical history significant for hypertension and history of  Barrett esophagus, gastroesophageal reflux disease, status post recent  colonoscopy in 2010, which did show colon polyp 3 times, status post  polypectomy, and status post upper endoscopy secondary to heartburn  which did show distal gastric erosion and erythema consistent with  gastritis, status post biopsy, otherwise, normal upper endoscopy.  The  patient admitted today with chief complaint of diffuse abdominal pain  which was started yesterday, abdominal pain was sudden.  Pain mainly at  the suprapubic area, but then become diffuse over the whole abdomen,  denies any radiation to the back.  Condition associated with heartburn.  Denies any vomiting, but admitted some nausea.  Last bowel movement 3  days ago.  He has loss of appetite for 3 days.  The patient denies any  chest pain and denies any shortness of breath.  Had a similar  presentation on January 19, 2009, where he was evaluated in the emergency  room at [facility] and had endoscopy and colonoscopy which resolved as  dictated.  Condition associated with some shaking and he felt as if he  had fever, but no documentation of any fever in the emergency room.  In  the emergency room, the patient found to have normal lipase and found to  have lactate acid of 9.8 and total bilirubin of 1.4, alkaline  phosphatase 32, and AST of 50.  Also, has an ABG which showed pH of 7.2  and CO2 43.2.  For that, CT angiogram of  the abdomen and pelvis was  done, the results were negative for any evidence of ischemia.  Also a CT  abdomen with contrast on February 22, 2009, did not show any evidence of  acute event.  ER asked the hospitalist to admit the patient for  evaluation of abdominal pain.   PAST MEDICAL HISTORY:  Significant for,  1. Abdominal pain.  2. Prostate hypertrophy.  3. History of alcohol abuse.  4. Gastroesophageal reflux disease.  5. History of Barrett esophagus.  6. Benign prostate hypertrophy.  7. Hypertension.   MEDICATIONS:  1. Norvasc 5 mg daily.  2. Flomax 0.4 mg daily.  3. Nexium 40 mg p.o. daily.   ALLERGY:  No known drug allergies.   PAST SURGICAL HISTORY:  He has a cut on his right shoulder.   SYSTEMIC REVIEW:  The patient complained of headache, complained of  blurring of vision, denies any sore throat, denies any chest pain,  denies any shortness of breath, denies any hematemesis, denies any  abnormal skin rash, and denies any tick  bite.  Denies any hematuria, but  complained of burning micturition.  Denies any weakness on his upper or  lower extremities.   SOCIAL HISTORY:  The patient is retired.  He denies any smoking, but  admit alcohol abuse since he was 19.  He admits his heavy drinker.  He  drinks  every weekend.  Last drink was yesterday around fifth of gin.  He is not married.  He has 8 children all grown.   PHYSICAL EXAMINATION:  VITAL SIGNS:  Temperature 95.8 rectal, pulse rate  77, respiratory rate 26, and blood pressure 125/83.  HEENT:  The patient is not in respiratory distress or shortness of  breath, feel weak, some shaking, noticed on his upper extremities and  lower extremities.  The mucosa is moist, no rash.  No tonsillar  hypertrophy.  NECK:  Supple.  No lymphadenopathy.  No masses.  HEART:  S1 and S2 with no added sounds.  LUNGS:  Normal vesicular breathing with equal air entry.  ABDOMEN:  Tenderness diffuse, mainly at the suprapubic area.  Murphy   sign negative.  No rebound or guarding.  Bowel sound positive.  RECTAL:  Empty rectal vault.  LOWER EXTREMITY:  No lower limb edema.  Peripheral pulses intact.   BLOOD WORKUP:  Lactic acid 7.3.  Urinalysis with ketone of more than 80.  ABG, pH 7.265, CO2 43.2, oxygen 37, bicarb 19.6, and lipase 58.  Liver  function test with a bilirubin 1.4, indirect bilirubin 1.1, alkaline  phosphatase 32, AST 50, ALT 22, total protein 7.7, and albumin 4.4.  Hemoglobin 15.3, hematocrit 45, sodium 137, potassium 3.1, chloride 104,  BUN 14, and creatinine 1.2.  CT angio of the abdomen and pelvis which  was negative for proximal mesenteric ischemia.  No pelvic vascular  abnormality and normal appendix.   ASSESSMENT AND PLAN:  1. Abdominal pain which seems chronic in nature, has a endoscopy and      colonoscopy done recently.  We will place the patient on clear      liquid diet, IV fluids, and Protonix IV.  Also will provide pain      medications for the patient.  The patient has a temperature of 95.8      rectally which is hypothermia, we will get septic workup including      chest x-ray and blood culture.  We will hold off on any antibiotic      at this time.  I did examine the rectal myself, I did not see any      evidence of prostatitis.  We will also get PSA level.  2. Elevated lactic acids, cause until now is not clear, I would repeat      lactic acid in a.m., repeat B-MET.  There is no evidence of      metabolic acidosis of today lab and we will repeat B-MET.  We will      cycle cardiac enzyme.  We will get ultrasound of the abdomen and      biliary system, we will consider calling Gastroenterology to      evaluate.  3. Hyperketone in the urine, which most probably secondary to      starvation.  4. Hypertension.  Norvasc.  5. Alcohol abuse.  We will provide alcohol withdrawal protocol.  6. Leukopenia, which could be secondary to alcohol abuse, but the      patient has bone marrow biopsy before  and it seems chronic problem      from  looking over this patient history.  7. Deep venous thrombosis and gastrointestinal prophylaxis.      Hind Bosie Helper, MD  Electronically Signed     HIE/MEDQ  D:  05/03/2009  T:  05/03/2009  Job:  [id number]

## 2011-03-18 NOTE — Discharge Summary (Signed)
NAME:  Kenneth Roberts, Kenneth Roberts NO.:  [id number]   MEDICAL RECORD NO.:  [id number]          PATIENT TYPE:  INP   LOCATION:  3707                         FACILITY:  [facility]   PHYSICIAN:  Elliot Cousin, M.D.    DATE OF BIRTH:  January 19, 1941   DATE OF ADMISSION:  05/03/2009  DATE OF DISCHARGE:  05/04/2009                               DISCHARGE SUMMARY   DISCHARGE DIAGNOSES:  1. Acute abdominal pain.  Etiology most likely secondary to acute      gastritis.  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  2. Elevated lactic acid.  The patient's lactic acid level was 9.8 on      admission.  However,  it normalized to 0.9 on supportive treatment      only.  3. History of esophageal reflux disease, Barrett esophagitis, and      colon polyps.  4. Alcohol abuse.  5. Hypertension.  6. Mild leukopenia and thrombocytopenia, likely secondary to alcohol      abuse.  7. Mild hepatic transaminitis likely secondary to alcohol abuse and      fatty liver.  Ultrasound of the abdomen on May 04, 2009 revealed no      significant pathology.   DISCHARGE DISPOSITION:  The patient is being discharged to home in  improved and stable condition.  He was advised to follow up with his  primary care physician and primary cardiologist, Dr. Sharyn Lull, in 1 week.   CONSULTATIONS:  None.   PROCEDURES PERFORMED:  1. Ultrasound of the abdomen on May 04, 2009.  The results revealed no      significant pathology.  No cause of pain identified.  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      Known benign hemangioma of the liver was not clearly demonstrated.  2. Chest x-ray on May 03, 2009.  The results revealed no acute      finding.  3. CT angiogram of the abdomen and  pelvis on May 03, 2009.  The      results revealed negative for proximal mesenteric arterial lesion      to suggest an etiology of occlusive mesenteric ischemia, fatty      liver, no pelvic vascular abnormality.   HISTORY OF PRESENT ILLNESS:  The patient is a 70 year old man with a  past medical history significant for alcohol abuse, Barrett esophagitis,  gastroesophageal reflux disease, status post colonoscopy in March 2010,  which revealed 3 colon polyps, status post polypectomy and a recent EGD,  which revealed gastritis, who presented to the emergency department on  May 03, 2009 with a chief complaint of abdominal pain.  When the patient  was evaluated in the emergency department, an abdominal x-ray was  ordered and it revealed no acute abnormality.  His hepatic function  panel was significant for a total bilirubin of 1.4, SGOT of 50 and an  SGPT of 22.  His blood lipase was within normal limits at 58.  His  urinalysis was essentially negative for nitrites; however, there was  evidence of ketones.  His lactic acid level was elevated at 9.8.  His  cardiac enzymes revealed a CK of 1278.  The patient was admitted for  further evaluation and management.   For additional details, please see the dictated history and physical.   HOSPITAL COURSE:  1. Acute abdominal pain, history of gastritis and colon polyps, mild      hepatic transaminitis, leukopenia, thrombocytopenia, lactic      acidosis, and alcohol abuse.  The patient was started on IV fluid      volume repletion with normal saline.  The Ativan protocol was      initiated as was vitamin therapy with thiamine and folic acid.      Protonix was started as well for presumed acute gastritis.  The      patient was evaluated with a CT scan (angiogram) of the abdomen and      pelvis to investigate the cause of lactic acidosis.  The results      were unremarkable with the exception of a hemangioma and fatty      liver.  The etiology of the  lactic acidosis was unclear, however,      it resolved with supportive treatment.  His troponin I was within      normal limits, although his CK was greater than 1000.  His liver      enzymes were modestly elevated as indicated above.  His followup      total bilirubin was 1.7 and his followup SGOT was 47.  The      patient's WBC was 3.3, mildly low however, in March 2010, his WBC      was 3.2 prior to hospital discharge.  His platelet count was 157 at      the time of the initial hospital assessment; however, prior to      hospital discharge, it fell gradually to 121.  More than likely,      the patient's leukopenia and thrombocytopenia were the effects of      chronic alcohol abuse.  He admitted to the nurse that he drank one      fifth of gin daily.  However, he disclosed to the dictating      physician that he drank one fifth of gin every weekend.  The      patient demonstrated no evidence of alcohol withdrawal syndrome      during the hospitalization.  An ultrasound of the abdomen was      ordered for additional evaluation and the results were essentially      unremarkable for any significant pathology.  On the followup      evaluation the next day, the patient desired to go home.  He stated      that he needed to pay rent.  I suspect that the patient wants to be      discharged to celebrate the July 4th weekend.  I strongly advised      the patient to avoid alcohol use as I indicated to him that if he      continues to drink alcohol, it was like pouring gasoline on fire      with regards to his abdominal pain.  The patient was receptive.  The clinical social worker was consulted for assistance with      services that could provide the patient with help with alcohol      cessation; however, the consultation was not completed prior to      hospital discharge.  The patient ambulated in the hallway as      witnessed by the nursing staff and the dictating physician.  There      was a  little unsteadiness initially; however, the patient's gait      normalized.  He is currently alert and oriented and he will be      discharged to home today.  He disclosed that he was not taking      Nexium on a daily basis and therefore a prescription was given to      him prior to hospital discharge.  2. Hypertension.  The patient's blood pressure remained stable and      controlled on Norvasc.      Elliot Cousin, M.D.  Electronically Signed     DF/MEDQ  D:  05/04/2009  T:  05/04/2009  Job:  [id number]   cc:   Eduardo Osier. Sharyn Lull, M.D.

## 2011-03-18 NOTE — Op Note (Signed)
NAME:  CODA, MATHEY NO.:  [id number]   MEDICAL RECORD NO.:  [id number]          PATIENT TYPE:  OUT   LOCATION:  ENDO                         FACILITY[facility]  [facility]   PHYSICIAN:  Shirley Friar, MDDATE OF BIRTH:  November 06, 1940   DATE OF PROCEDURE:  01/19/2009  DATE OF DISCHARGE:                               OPERATIVE REPORT   PROCEDURE:  Colonoscopy.   INDICATIONS:  Screening.   MEDICATIONS:  1. Fentanyl 100 mcg IV.  2. Versed 7 mg IV.   FINDINGS:  His rectal exam was normal.  A pediatric Pentax colonoscope  was inserted into a well-prepped colon and advanced to the cecum where  the ileocecal valve and appendiceal orifice were identified.  The  terminal ileum was intubated and was normal in its appearance.  On  careful withdrawal of the colonoscope three small sessile polyps were  seen, two in the transverse colon and one in the descending colon.  They  ranged from 2-mm to 4-mm in diameter.  Snare cautery was used to remove  these polyps.  Retroflexion in the rectum revealed small internal  hemorrhoids.  No other mucosal abnormalities were seen.   ASSESSMENT:  1. Colon polyps times three status post snare polypectomy times three.  2. Internal hemorrhoids.   PLAN:  1. Follow-up on path.  2. Hold aspirin products x2 weeks.      Shirley Friar, MD  Electronically Signed     VCS/MEDQ  D:  01/19/2009  T:  01/19/2009  Job:  [id number]

## 2011-03-18 NOTE — Assessment & Plan Note (Signed)
[facility]                         GASTROENTEROLOGY OFFICE NOTE   Kenneth, Roberts                      MRN:          [id number]  DATE:10/27/2007                            DOB:          05-07-1941    Kenneth Roberts is a 70 year old [place] American male that I have  previously evaluated.  He is followed by Duke Salvia, NP, at  [facility]  Please see my note from Mar 19, 2006.  His history is  vague but he complains of a raw sensation in his throat like he has  phlegm that will not release.  He had a recent upper GI series at  [facility] which showed a small hiatal hernia and transient  sticking of the 13-mm barium tablet at the level of the aortic arch.  Apparently, he had complaints while the barium tablet was sticking that  then resolved.  He also relates ongoing problems with suprapubic pain.  He notes that the suprapubic pain is an ongoing trouble that worsens  intermittently and occasionally radiates to the epigastric area.  He has  had prior evaluation with upper endoscopy in October 2007 that showed  GERD, a hiatal hernia and esophagitis, and a colonoscopy in December  2000 which showed small internal hemorrhoids and an area of polypoid  mucosa that turned out to be normal mucosa.  There was no adenomatous  polyp identified.  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  He relates no  problems with constipation, diarrhea, nausea, vomiting, dysphagia,  melena, hematochezia, or weight loss.   CURRENT MEDICATIONS:  1. Norvasc 10 mg daily.  2. Protonix 40 mg daily.  3. Flomax 0.4 mg daily.  4. Motrin p.r.n.  5. Tums p.r.n.   MEDICATION ALLERGIES:  None known.   PHYSICAL EXAMINATION:  No acute distress.  Weight 181.4 pounds, blood  pressure is 128/84, pulse 88 and regular.  CHEST:  Clear to  auscultation bilaterally.  CARDIAC:  Regular rate and rhythm without murmurs appreciated.  ABDOMEN:  Soft with mild suprapubic area of tenderness to light  palpation.  No rebound or guarding.  No palpable organomegaly, masses or  hernias.  Normal active bowel sounds.  NEUROLOGIC:  Mildly anxious.  Alert and oriented x3.  Grossly nonfocal.   ASSESSMENT AND PLAN:  1. Suprapubic pain and tenderness.  Prior gastrointestinal evaluation      was unremarkable.  His symptoms are likely musculoskeletal or      genitourinary related.  I think it is unlikely that this is a      gastrointestinal disorder.  Will proceed with a CT scan of the      pelvis for further evaluation and he is to return to Duke Salvia,      NP, for further followup and management.  2. Minimally abnormal upper gastrointestinal series.  There was      transient sticking of the barium tablet at the level of the aortic      arch  with no other pathology noted in that area.  Prior endoscopy      showed no pathology in the esophagus except for mild distal      esophagitis.  He is advised to thoroughly chew his food and take      plenty of liquids with larger pills.  3. History of gastroesophageal reflux disease and a hiatal hernia.      Maintain long-term antireflux measures and a proton pump inhibitor      on a daily basis.  4. All ongoing care with Duke Salvia, NP.     Kenneth Lick. Russella Dar, MD, [facility]  Electronically Signed    MTS/MedQ  DD: 10/27/2007  DT: 10/27/2007  Job #: [id number]   cc:   Duke Salvia, NP

## 2011-03-21 ENCOUNTER — Other Ambulatory Visit: Payer: Self-pay | Admitting: Gastroenterology

## 2011-03-21 MED ORDER — OMEPRAZOLE 40 MG PO CPDR: 40.0000 mg | DELAYED_RELEASE_CAPSULE | Freq: Every day | ORAL | Status: DC

## 2011-03-21 NOTE — Discharge Summary (Signed)
NAME:  Kenneth Roberts, CLERK NO.:  [id number]   MEDICAL RECORD NO.:  [id number]          PATIENT TYPE:  OBV   LOCATION:  4705                         FACILITY:  [facility]   PHYSICIAN:  Fransisco Hertz, M.D.  DATE OF BIRTH:  19-Jan-1941   DATE OF ADMISSION:  07/13/2007  DATE OF DISCHARGE:  07/14/2007                               DISCHARGE SUMMARY   DISCHARGE DIAGNOSES:  1. Atypical chest pain.  2. Gastroesophageal reflux disease.  3. Benign prostatic hypertrophy.  4. History of alcohol abuse.  5. Hypertension.   DISCHARGE MEDICATIONS:  1. Aciphex 20 mg p.o. b.i.d.  2. Norvasc 5 mg p.o. daily.  3. Multivitamin daily.  4. Thiamine 100 mg daily.  5. Folate 1 mg daily.  6. Tylenol 650 mg one tablet q.4h. p.r.n. for pain.  7. Flomax 0.4 mg p.o. daily.   DISPOSITION AND FOLLOW-UP:  Kenneth Roberts was discharged from the  hospital after a period of 24 hours' observation in stable and improved  condition.  He has a follow-up appointment with Fannie Knee Drinkard at  [facility] on August 29 at 10 a.m.  At the time of discharge he will  need discharge follow-up.  He will need to be scheduled for an upper  endoscopy since he has persistent reflux in addition to an alcohol  history with frequent vomiting.   PROCEDURES:  1. Chest x-ray:  Probable chronic right pleural thickening, no acute      cardiopulmonary disease.  2. Serial EKGs:  Normal sinus rhythm, mild LVH, no evidence of ST      depression, no evidence of ST elevation, no evidence of T-wave      inversion.   CONSULTATION:  None.   BRIEF ADMITTING HISTORY AND PHYSICAL:  Kenneth Roberts is a 70 year old  African American man with a past medical history of GERD, hypertension  and alcohol abuse, who was admitted to the hospital with an acute  episode of chest pain.  The chest pain lasted 2-3 hours while he was  vacuuming his apartment.  It is felt similar to episodes of reflux in  the past but felt it was more severe.  It was  associated with some  diaphoresis and shaking.  He had no shortness of breath.  The pain  improved with antacids.  The pain followed a binge drinking episode.  He  denied any nausea, vomiting, hematemesis or melena.  He did report  vomiting 1 day prior to his chest pain after alcohol ingestion.   ADMISSION VITAL SIGNS:  Temperature 97.1, blood pressure 128/85, pulse  84, respiratory rate 20, O2 saturation is 100% on room air.   ADMISSION LABORATORIES:  Sodium 136, potassium 3.6, chloride 101, bicarb  25, BUN 12, creatinine 1.1, glucose 102.  Hemoglobin 13.1, white blood  cell count 3.6.  Cardiac point of care markers negative.  UDS negative.   HOSPITAL COURSE BY PROBLEM:  Problem 1.  ATYPICAL CHEST PAIN:  Kenneth Roberts was admitted to the  telemetry unit for rule-out of cardiac source of his chest pain.  His  cardiac enzymes were cycled and negative x3.  He demonstrated  no  abnormalities on 24 hours of telemetry.  Serial 12-lead EKGs were  unremarkable.  He had no recurrence of his chest pain while in the  hospital..  Given his history of binge drinking and vomiting in addition  to his gastroesophageal reflux disease, it is likely that there is a  gastrointestinal component to his presentation and consistent with  esophagitis.  He will need an endoscopy as an outpatient.  Of note, his  lipase was normal, liver function tests were normal.  He was Hemoccult-  negative.   Problem 2.  In terms of his hypertension, he was continued on his home  medications and had normal blood pressure throughout his  hospitalization.   DISCHARGE LABORATORY DATA:  Sodium 136, potassium 3.7, chloride 104,  bicarb 22, BUN 10, creatinine 0.8, glucose 94.  Hemoglobin 12.9, white  blood cells 4.1, MCV 90.9.   DISCHARGE VITAL SIGNS:  T-max 98.2, blood pressure 127/72, pulse 55-81,  respiratory rate 18-20, O2 saturation 100% on room air.      Edsel Petrin, D.O.  Electronically Signed       Fransisco Hertz, M.D.  Electronically Signed    ELG/MEDQ  D:  09/07/2007  T:  09/08/2007  Job:  [id number]

## 2011-03-21 NOTE — Discharge Summary (Signed)
NAME:  Kenneth Roberts, Kenneth Roberts NO.:  [id number]   MEDICAL RECORD NO.:  [id number]          PATIENT TYPE:  OBV   LOCATION:  4707                         FACILITY:  [facility]   PHYSICIAN:  Norton Blizzard, M.D.    DATE OF BIRTH:  1941/08/29   DATE OF ADMISSION:  06/21/2006  DATE OF DISCHARGE:  06/22/2006                                 DISCHARGE SUMMARY   ADMISSION DIAGNOSES:  1. Epigastric/chest pain, rule out myocardial infarction.  2. History of gastroesophageal reflux disease.  3. Alcohol abuse.  4. Hypertension.  5. Benign prostatic hypertrophy.   DISCHARGE DIAGNOSES:  1. Gastroesophageal reflux disease with questionable peptic ulcer disease.  2. Alcohol abuse.  3. Hypertension.  4. Benign prostatic hypertrophy.   CONSULTATIONS:  None.   PROCEDURES:  1. EKG.  2. Chest x-ray.   HISTORY AND PHYSICAL:  In brief, this is a 70 year old male with a history  of GERD and alcohol abuse who presented with epigastric pain that migrated  to becoming substernal chest pain over 2 to 3 days.  States the pain in his  chest was a ringing that comes and goes.  He had no recent cardiac workup.  He had been on AcipHex 20 mg daily.   ADMITTING PHYSICAL EXAMINATION:  VITAL SIGNS:  The patient was afebrile with  normal vital signs.  GENERAL:  Had mild epigastric tenderness, was heme-negative.  Otherwise,  normal physical exam.   ADMITTING LABORATORY DATA:  D-Dimer negative.  Lipase mildly elevated at 55.  Point of care enzymes negative x1.  Hemoglobin 13.2, white blood cell count  2.7.  EKG was normal sinus without any ST-T changes or T-waves.  Chest x-ray  showed nothing acute.   HOSPITAL COURSE:  1. Epigastric/chest pain.  This story is consistent with GERD versus      peptic ulcer disease, but the description of the chest pain was      worrisome for an MI.  He was ruled out with cardiac markers x3.  EKG      was normal on the day of admit and on post admission day #1.   D-Dimer      was negative.  There were no signs or symptoms of infection.  Chest x-      ray was negative for pneumonia.  Telemetry was without any events.      Lipase was slightly elevated but, without a history of pancreatis in      the past, we would expect an acute pancreatitis' number would be      markedly elevated.  His epigastric pain was also consistent with his      gastroesophageal reflux disease per the patient.  He is on AcipHex and      still with pain and likely needs an EGD, but since the patient was heme-      negative, his hemoglobin was stable, he felt much better on post      admission day #1, and he has good follow up, we decided he can get the      EGD as an outpatient.  We started him on  Protonix at 40 mg here and      increased his home dose of AcipHex to 20 mg b.i.d.  2. Alcohol abuse.  The patient was started on thiamine, folate, and      multivitamin.  He was placed on Ativan protocol.  Social work consult      came by, the patient did not want any further intervention or      assistance.  We continued the aforementioned vitamins as an outpatient.  3. Hypertension.  This was well controlled throughout his stay.  We      continued his home dose of his Norvasc.  4. BPH.  Continued Flomax.  His prostate was noted to be enlarged without      any nodules on rectal exam.   DISCHARGE MEDICATIONS AND INSTRUCTIONS:  1. AcipHex 20 mg b.i.d.  2. Norvasc 5 mg daily.  3. Multivitamin 1 daily.  4. Thiamine 100 mg daily.  5. Folate 1 mg daily.  6. Tylenol 650 mg 1 tab q.4 hours p.r.n. pain.  7. Flomax 0.4 mg daily.  8. Patient to remind physician about scheduling EGD.  Also to call M.D.      for any vomiting, blood in stool, worsening pain, or other concerns.   DISCHARGE CONDITION:  Good, improved.   FOLLOWUP:  Follow up with Dr. Dub Amis at [facility] on [address] on July 01, 2006 at 10:00 a.m.   ISSUES FOR FOLLOW UP:  To schedule outpatient EGD to assess for peptic  ulcer  disease.           ______________________________  Norton Blizzard, M.D.     SH/MEDQ  D:  06/26/2006  T:  06/27/2006  Job:  [id number]   cc:   Dinkard, M.D.

## 2011-03-21 NOTE — Telephone Encounter (Signed)
Rx sent to his pharmacy

## 2011-03-21 NOTE — H&P (Signed)
NAME:  Kenneth Roberts, Kenneth Roberts NO.:  [id number]   MEDICAL RECORD NO.:  [id number]          PATIENT TYPE:  INP   LOCATION:  4707                         FACILITY:  [facility]   PHYSICIAN:  Norton Blizzard, M.D.    DATE OF BIRTH:  27-Sep-1941   DATE OF ADMISSION:  06/21/2006  DATE OF DISCHARGE:                                HISTORY & PHYSICAL   CHIEF COMPLAINT:  Chest pain.   HISTORY OF PRESENT ILLNESS:  This is a 70 year old male with a history of  GERD who presents with complaint of epigastric pain that started 2-3 weeks  ago and moved over that time to include mid sternal chest pain.  He states  the stomach pain is usually there, but the chest pain comes and goes.  It  lasts about one hour at a time and is described as squeezing or like  ringing out a wash rag.  It radiates from the chest into the left shoulder  as well during these episodes.   The patient states he has been nauseous, dizzy, has occasional cold chills.  He says it is uncomfortable to lay flat.  When pressed about this, he stated  that laying flat exacerbates his reflux.  He has wheezing in his throat  and states it feels like there is a lump in the back of his chest.  He  denies fevers, vomiting, melena, hematochezia.  The pain does not radiated  to his back.   The patient had a stress test about 8-10 years ago that was normal.  He has  not had a catheterization or any other workup since.   The patient admits to heavy alcohol use.  He drank one fifth, a pint and two  beers yesterday.  He was told by a physician in the past to cut down on his  alcohol.  __________was positive x3 for the cutdown, guilt and eye opener.  He quit smoking about 20 years ago.  He has to elevate head of the bed while  lying down, or his reflux bothers him.   PAST MEDICAL HISTORY:  1. GERD.  2. BPH.  3. Alcohol abuse.  4. Hypertension.   MEDICATIONS:  1. Protonix 40 mg daily.  2. Norvasc 5 mg daily.  3. Flomax 0.4 mg  daily.   ALLERGIES:  NKDA.   SOCIAL HISTORY:  Alcohol as per HPI.  Quit smoking 20 years ago.  Denies  drug use.   FAMILY HISTORY:  Father positive for diabetes mellitus.  Otherwise,  noncontributory.   REVIEW OF SYSTEMS:  As per HPI.   PHYSICAL EXAMINATION:  VITAL SIGNS:  Temperature 98.6, pulse 74, blood  pressure 115/74, respirations 18, pulse oximetry 99% on room air.  GENERAL APPEARANCE:  No acute distress, resting.  HEENT:  Pupils equal, round and reactive to light.  NECK:  No lymphadenopathy.  CARDIOVASCULAR:  Regular rate and rhythm.  No murmurs, rubs or gallops  heard, though it was loud in the hallway.  LUNGS:  Clear to auscultation bilaterally.  ABDOMEN:  Soft, nondistended with mild epigastric tenderness.  Bowel sounds  positive with no hepatosplenomegaly.  No rebound, guarding or rigidity.  EXTREMITIES:  Warm and well perfused, no edema.   LABORATORY DATA:  White blood cell count low at 2.7, hemoglobin 13.2,  platelets 149,000.  BMP was within normal limits. D. dimer was negative.  Point of  care enzymes negative x1.  Lipase slightly elevated at 55.  EKG  showed normal sinus rhythm without evidence of ischemia.  Chest x-ray showed  nothing acute.   ASSESSMENT/PLAN:  This is a 70 year old with:  1. Epigastric/chest pain.  Patient with history of gastroesophageal reflux      disease and epigastric pain in the past, but states he has never had      this kind of chest pain.  EKG was normal.  Markers are negative x1.      Chest x-ray was normal.  No signs or symptoms of infection with a low      white blood cell count and afebrile.  Lipase was mildly elevated, but      patient with heavy alcohol yesterday.  He denies pancreatitis in the      past, and we expect only this slight elevation to be consistent with      chronic pancreatitis.  Maybe peptic ulcer disease, but with the patient      having no vomiting, hematemesis, blood in his stools, can likely be      worked  up as an outpatient if the pain continues.  We will hemoccult      him and do a rectal exam.  Hemoglobin is stable.  LFT's are normal, so      unlikely due to gallstones.  He likely needs an EGD as an outpatient      for follow up of his severe GERD.  It sounds like he has not had one in      the past.  Will continue his Protonix at 40 mg daily.  He was given      aspirin in the ED for question whether this was an MI.  We will      complete three sets of markers, get an EKG in the morning.  Morphine      for pain and Zofran for nausea.  2. Alcohol abuse.  He started Ativan protocol day #2.  I will get a Equities trader consultation in the morning to discuss options for symptoms.      Thiamine, folate and multivitamin.  3. Hypertension.  This is well controlled here with his blood pressure      being 115/74.  We will continue his Norvasc.  4. Benign prostatic hyperplasia.  We will continue his Flomax.           ______________________________  Norton Blizzard, M.D.     SH/MEDQ  D:  06/22/2006  T:  06/22/2006  Job:  [id number]

## 2011-03-24 ENCOUNTER — Ambulatory Visit (INDEPENDENT_AMBULATORY_CARE_PROVIDER_SITE_OTHER): Payer: PRIVATE HEALTH INSURANCE | Admitting: Gastroenterology

## 2011-03-24 DIAGNOSIS — D649 Anemia, unspecified: Secondary | ICD-10-CM

## 2011-03-24 DIAGNOSIS — K922 Gastrointestinal hemorrhage, unspecified: Secondary | ICD-10-CM

## 2011-03-24 NOTE — Progress Notes (Signed)
Patient here for capsule endoscopy for Dr. Russella Dar. Pt verbalized understanding of all verbal and written instructions. Pt tolerated well. Lot #[id number] exp 2013/04                  25

## 2011-04-02 NOTE — Progress Notes (Signed)
Reason for referral: 70 year old male with history of melena and anemia.  Recent negative EGD and colonoscopy  Procedure Info and Findings: 1) Complete study, good prep 2 ) Small submucosal lesion/mass ( around 1 cm) noted at 3 hours and 42 minutes.  Smooth, non ulcerated 3) otherwise normal study  Summary and Recommendations:  No finding to explain melena or anemia. The submucosal lesion is likely incidental.

## 2011-04-04 ENCOUNTER — Encounter: Payer: Self-pay | Admitting: Gastroenterology

## 2011-04-07 ENCOUNTER — Telehealth: Payer: Self-pay | Admitting: Gastroenterology

## 2011-04-07 MED ORDER — OMEPRAZOLE 40 MG PO CPDR: 40.0000 mg | DELAYED_RELEASE_CAPSULE | Freq: Every day | ORAL | Status: DC

## 2011-04-07 NOTE — Telephone Encounter (Signed)
Patient was having trouble at the pharmacy.  I have given him the results of his capsule endo also.

## 2011-04-07 NOTE — Telephone Encounter (Signed)
I attempted to reach the patient I can hear him , but he can't hear me.  I will try and call him later.

## 2011-04-07 NOTE — Telephone Encounter (Signed)
Patient is now calling to state he is out of medicine and [facility] won't fill it.  He has been taking 2 40 mg omeprazole in the am because he thought they were 20s and is out of meds.  I have explained to him that he needs to be taking 1 tablet a day and he will come pick up samples of Prilosec OTC until he can get his rx refilled.  I explained he will need to take 2 Prilosec a day until he gets his rx

## 2011-04-07 NOTE — Telephone Encounter (Signed)
Addended by: Annett Fabian on: 04/07/2011 05:10 PM   Modules accepted: Orders

## 2011-04-14 ENCOUNTER — Emergency Department (HOSPITAL_COMMUNITY)
Admission: EM | Admit: 2011-04-14 | Discharge: 2011-04-14 | Disposition: A | Discharge status: Home / Self Care | Payer: PRIVATE HEALTH INSURANCE | Attending: Emergency Medicine | Admitting: Emergency Medicine

## 2011-04-14 ENCOUNTER — Emergency Department (HOSPITAL_COMMUNITY): Payer: PRIVATE HEALTH INSURANCE

## 2011-04-14 DIAGNOSIS — R42 Dizziness and giddiness: Secondary | ICD-10-CM | POA: Insufficient documentation

## 2011-04-14 DIAGNOSIS — R05 Cough: Secondary | ICD-10-CM | POA: Insufficient documentation

## 2011-04-14 DIAGNOSIS — R079 Chest pain, unspecified: Secondary | ICD-10-CM | POA: Insufficient documentation

## 2011-04-14 DIAGNOSIS — R059 Cough, unspecified: Secondary | ICD-10-CM | POA: Insufficient documentation

## 2011-04-14 DIAGNOSIS — R6883 Chills (without fever): Secondary | ICD-10-CM | POA: Insufficient documentation

## 2011-04-14 DIAGNOSIS — J069 Acute upper respiratory infection, unspecified: Secondary | ICD-10-CM | POA: Insufficient documentation

## 2011-04-14 DIAGNOSIS — R110 Nausea: Secondary | ICD-10-CM | POA: Insufficient documentation

## 2011-04-14 LAB — CBC
MCH: 30 pg (ref 26.0–34.0)
MCV: 88.2 fL (ref 78.0–100.0)
Platelets: 174 10*3/uL (ref 150–400)
RBC: 4.23 MIL/uL (ref 4.22–5.81)
RDW: 14.4 % (ref 11.5–15.5)

## 2011-04-14 LAB — COMPREHENSIVE METABOLIC PANEL
AST: 17 U/L (ref 0–37)
CO2: 27 mEq/L (ref 19–32)
Calcium: 9.9 mg/dL (ref 8.4–10.5)
Creatinine, Ser: 0.86 mg/dL (ref 0.4–1.5)
GFR calc Af Amer: >60 mL/min (ref >60)
GFR calc non Af Amer: >60 mL/min (ref >60)

## 2011-04-14 LAB — DIFFERENTIAL
Eosinophils Absolute: 0 10*3/uL (ref 0.0–0.7)
Eosinophils Relative: 1 % (ref 0–5)
Lymphs Abs: 1 10*3/uL (ref 0.7–4.0)
Monocytes Relative: 12 % (ref 3–12)

## 2011-05-27 ENCOUNTER — Encounter: Payer: Self-pay | Admitting: Podiatry

## 2011-07-30 ENCOUNTER — Telehealth: Payer: Self-pay | Admitting: Gastroenterology

## 2011-07-30 LAB — HEPATIC FUNCTION PANEL
ALT: 24
AST: 24
Albumin: 3.9
Total Protein: 6.8

## 2011-07-30 LAB — BASIC METABOLIC PANEL
Calcium: 9.3
GFR calc Af Amer: >60
GFR calc non Af Amer: >60
Potassium: 4.2
Sodium: 142

## 2011-07-30 LAB — LIPID PANEL
HDL: 52
LDL Cholesterol: 98
Total CHOL/HDL Ratio: 3.1
Triglycerides: 43
VLDL: 9

## 2011-07-30 NOTE — Telephone Encounter (Signed)
Patient called to report that he feels there is something in his throat.  He stopped taking omeprazole a few months ago.  I have asked him to resume his omeprazole and if this doesn't help he should see his primary care MD

## 2011-08-15 LAB — DIFFERENTIAL
Basophils Absolute: 0
Eosinophils Relative: 1
Lymphocytes Relative: 21
Neutro Abs: 2.5
Neutrophils Relative %: 68

## 2011-08-15 LAB — BASIC METABOLIC PANEL
BUN: 10
CO2: 22
Chloride: 104
Creatinine, Ser: 0.89
GFR calc Af Amer: >60

## 2011-08-15 LAB — COMPREHENSIVE METABOLIC PANEL
ALT: 24
AST: 31
Alkaline Phosphatase: 31 — ABNORMAL LOW
CO2: 26
Chloride: 101
GFR calc Af Amer: >60
GFR calc non Af Amer: >60
Sodium: 136
Total Bilirubin: 0.5

## 2011-08-15 LAB — I-STAT 8, (EC8 V) (CONVERTED LAB)
BUN: 12
Bicarbonate: 25.7 — ABNORMAL HIGH
Glucose, Bld: 102 — ABNORMAL HIGH
Hemoglobin: 14.6
Sodium: 136

## 2011-08-15 LAB — CBC
HCT: 37.7 — ABNORMAL LOW
MCV: 90.9
Platelets: 101 — ABNORMAL LOW
RBC: 4.12 — ABNORMAL LOW
RDW: 16.3 — ABNORMAL HIGH
WBC: 4.1

## 2011-08-15 LAB — RAPID URINE DRUG SCREEN, HOSP PERFORMED
Cocaine: NOT DETECTED
Opiates: NOT DETECTED
Tetrahydrocannabinol: NOT DETECTED

## 2011-08-15 LAB — LIPID PANEL
Cholesterol: 175
HDL: 50
Triglycerides: 127

## 2011-08-15 LAB — ETHANOL: Alcohol, Ethyl (B): <5

## 2011-08-15 LAB — CARDIAC PANEL(CRET KIN+CKTOT+MB+TROPI): Total CK: 195

## 2011-08-15 LAB — POCT CARDIAC MARKERS
Myoglobin, poc: 91.8
Operator id: 288831
Troponin i, poc: <0.05

## 2012-01-13 ENCOUNTER — Telehealth: Payer: Self-pay | Admitting: Gastroenterology

## 2012-01-13 NOTE — Telephone Encounter (Signed)
Patient reports black stools.  The patient has been taking pepto bismol.  He is asked to watch his stools and if the black stool continue several days after stopping the pepto then he will need an office visit for evaluation.  The patient verbalized understanding.

## 2012-01-19 ENCOUNTER — Emergency Department (HOSPITAL_COMMUNITY)
Admission: EM | Admit: 2012-01-19 | Discharge: 2012-01-19 | Disposition: A | Discharge status: Home / Self Care | Payer: PRIVATE HEALTH INSURANCE | Attending: Emergency Medicine | Admitting: Emergency Medicine

## 2012-01-19 ENCOUNTER — Encounter (HOSPITAL_COMMUNITY): Payer: Self-pay

## 2012-01-19 ENCOUNTER — Emergency Department (HOSPITAL_COMMUNITY): Payer: PRIVATE HEALTH INSURANCE

## 2012-01-19 DIAGNOSIS — I10 Essential (primary) hypertension: Secondary | ICD-10-CM | POA: Insufficient documentation

## 2012-01-19 DIAGNOSIS — R197 Diarrhea, unspecified: Secondary | ICD-10-CM | POA: Insufficient documentation

## 2012-01-19 DIAGNOSIS — R42 Dizziness and giddiness: Secondary | ICD-10-CM | POA: Insufficient documentation

## 2012-01-19 DIAGNOSIS — R109 Unspecified abdominal pain: Secondary | ICD-10-CM | POA: Insufficient documentation

## 2012-01-19 DIAGNOSIS — R51 Headache: Secondary | ICD-10-CM | POA: Insufficient documentation

## 2012-01-19 LAB — URINALYSIS, ROUTINE W REFLEX MICROSCOPIC
Hgb urine dipstick: NEGATIVE
Leukocytes, UA: NEGATIVE
Nitrite: NEGATIVE
Protein, ur: NEGATIVE mg/dL
Urobilinogen, UA: 0.2 mg/dL (ref 0.0–1.0)

## 2012-01-19 LAB — DIFFERENTIAL
Basophils Absolute: 0 10*3/uL (ref 0.0–0.1)
Eosinophils Absolute: 0 10*3/uL (ref 0.0–0.7)
Lymphs Abs: 1.2 10*3/uL (ref 0.7–4.0)
Monocytes Absolute: 0.5 10*3/uL (ref 0.1–1.0)
Neutrophils Relative %: 48 % (ref 43–77)

## 2012-01-19 LAB — CBC
MCH: 33.9 pg (ref 26.0–34.0)
MCV: 89.9 fL (ref 78.0–100.0)
Platelets: 141 10*3/uL — ABNORMAL LOW (ref 150–400)
RDW: 13 % (ref 11.5–15.5)
WBC: 3.3 10*3/uL — ABNORMAL LOW (ref 4.0–10.5)

## 2012-01-19 LAB — BASIC METABOLIC PANEL
Calcium: 9.7 mg/dL (ref 8.4–10.5)
GFR calc Af Amer: 87 mL/min — ABNORMAL LOW (ref >90)
GFR calc non Af Amer: 75 mL/min — ABNORMAL LOW (ref >90)
Sodium: 131 mEq/L — ABNORMAL LOW (ref 135–145)

## 2012-01-19 MED ORDER — HYDROCODONE-ACETAMINOPHEN 5-325 MG PO TABS: 1.0000 | ORAL_TABLET | Freq: Four times a day (QID) | ORAL | Status: AC | PRN

## 2012-01-19 MED ORDER — IOHEXOL 300 MG/ML  SOLN
80.0000 mL | Freq: Once | INTRAMUSCULAR | Status: AC | PRN
  Administered 2012-01-19: 80 mL via INTRAVENOUS

## 2012-01-19 MED ORDER — ONDANSETRON HCL 4 MG/2ML IJ SOLN
4.0000 mg | Freq: Once | INTRAMUSCULAR | Status: AC
  Administered 2012-01-19: 4 mg via INTRAVENOUS
  Filled 2012-01-19: qty 2

## 2012-01-19 MED ORDER — IOHEXOL 300 MG/ML  SOLN
20.0000 mL | INTRAMUSCULAR | Status: AC
  Administered 2012-01-19 (×2): 20 mL via ORAL

## 2012-01-19 MED ORDER — SODIUM CHLORIDE 0.9 % IV BOLUS (SEPSIS)
1000.0000 mL | Freq: Once | INTRAVENOUS | Status: AC
  Administered 2012-01-19: 1000 mL via INTRAVENOUS

## 2012-01-19 MED ORDER — POLYETHYLENE GLYCOL 3350 17 G PO PACK: 17.0000 g | PACK | Freq: Every day | ORAL | Status: AC | PRN

## 2012-01-19 MED ORDER — SODIUM CHLORIDE 0.9 % IV SOLN: Freq: Once | INTRAVENOUS | Status: DC

## 2012-01-19 MED ORDER — MORPHINE SULFATE 4 MG/ML IJ SOLN
4.0000 mg | Freq: Once | INTRAMUSCULAR | Status: AC
  Administered 2012-01-19: 4 mg via INTRAVENOUS
  Filled 2012-01-19: qty 1

## 2012-01-19 NOTE — ED Notes (Signed)
Drinking oral contrast for CT scan.

## 2012-01-19 NOTE — ED Notes (Signed)
Patient transported to CT 

## 2012-01-19 NOTE — ED Notes (Signed)
Pt still in CT

## 2012-01-19 NOTE — ED Notes (Signed)
Pt getting dressed into a gown

## 2012-01-19 NOTE — ED Notes (Signed)
Seen dentist this am and uanble to have procedure due to continued dizzy, hypotension, headache, scratchy raspy throat.

## 2012-01-19 NOTE — ED Provider Notes (Signed)
Medical screening examination/treatment/procedure(s) were conducted as a shared visit with non-physician practitioner(s) and myself.  I personally evaluated the patient during the encounter  He has had abdominal pain for several days. Associated with decreasing stool volume. Prior to that he was having diarrhea for several days. He has been seen in evaluated by GI in the past with colonoscopy. He feels better with the treatment has been given here today. Overall evaluations, most consistent with a stooling disorder, possibly constipation. There is no sign of severe illness including structural abnormalities of the colon. He'll likely need to follow up with GI after treatment.  Flint Melter, MD 01/19/12 [phone]

## 2012-01-19 NOTE — ED Notes (Signed)
Finished drinking contrast. CT scan notified

## 2012-01-19 NOTE — ED Provider Notes (Signed)
Medical screening examination/treatment/procedure(s) were conducted as a shared visit with non-physician practitioner(s) and myself.  I personally evaluated the patient during the encounter  Flint Melter, MD 01/19/12 1739

## 2012-01-19 NOTE — Discharge Instructions (Signed)
Please call Dr Russella Dar for a follow up appointment.  Use the prescribed medication as needed to relieve your symptoms.  If you develop high fevers, uncontrolled pain, inability to tolerate fluids by mouth, uncontrolled vomitined and diarrhea, please return to the ER for a recheck.  You may return to the ER at any time for worsening condition or any new symptoms that concern you.   Abdominal Pain Abdominal pain can be caused by many things. Your caregiver decides the seriousness of your pain by an examination and possibly blood tests and X-rays. Many cases can be observed and treated at home. Most abdominal pain is not caused by a disease and will probably improve without treatment. However, in many cases, more time must pass before a clear cause of the pain can be found. Before that point, it may not be known if you need more testing, or if hospitalization or surgery is needed. HOME CARE INSTRUCTIONS   Do not take laxatives unless directed by your caregiver.   Take pain medicine only as directed by your caregiver.   Only take over-the-counter or prescription medicines for pain, discomfort, or fever as directed by your caregiver.   Try a clear liquid diet (broth, tea, or water) for as long as directed by your caregiver. Slowly move to a bland diet as tolerated.  SEEK IMMEDIATE MEDICAL CARE IF:   The pain does not go away.   You have a fever.   You keep throwing up (vomiting).   The pain is felt only in portions of the abdomen. Pain in the right side could possibly be appendicitis. In an adult, pain in the left lower portion of the abdomen could be colitis or diverticulitis.   You pass bloody or black tarry stools.  MAKE SURE YOU:   Understand these instructions.   Will watch your condition.   Will get help right away if you are not doing well or get worse.  Document Released: 07/30/2005 Document Revised: 10/09/2011 Document Reviewed: 06/07/2008 [facility] Patient Information 2012  [place], [place].

## 2012-01-19 NOTE — ED Notes (Signed)
C/o mid lower abd pain, intermittent dizziness upon standing, decreased appetite & generalized malaise since Wed.  Reports vomited just one day on wed, diarrhea x 3 days Thurs-Sat, both resolved presently. States now just doesn't have an appetite, pain & dizziness continues

## 2012-01-19 NOTE — ED Provider Notes (Signed)
History     CSN: [id number]  Arrival date & time 01/19/12  1478   First MD Initiated Contact with Patient 01/19/12 [phone]      Chief Complaint  Patient presents with  . Dizziness  . Abdominal Pain  . Headache    (Consider location/radiation/quality/duration/timing/severity/associated sxs/prior treatment) HPI Comments: Patient reports he has had 6 days of dizziness, lightheadedness, headache, lower abdominal pain, nausea, diarrhea, also feels like there is a "lump" in the bottom of his throat.  Associated decreased appetite.  States when the symptoms began he also vomited once and sweats and chills that have since resolved.  Has lost 6 lbs since the symptoms began.  Denies blood in his stool, urinary symptoms.    The history is provided by the patient.    Past Medical History  Diagnosis Date  . Hypertension   . Cataract   . GERD (gastroesophageal reflux disease)     History reviewed. No pertinent past surgical history.  History reviewed. No pertinent family history.  History  Substance Use Topics  . Smoking status: Former Smoker    Quit date: 02/16/1981  . Smokeless tobacco: Not on file  . Alcohol Use: 7.5 oz/week    15 drink(s) per week      Review of Systems  Constitutional: Positive for appetite change.  Gastrointestinal: Negative for blood in stool and anal bleeding.  Genitourinary: Negative for dysuria, urgency, frequency and decreased urine volume.  Neurological: Negative for syncope.  All other systems reviewed and are negative.    Allergies  Prilosec  Home Medications   Current Outpatient Rx  Name Route Sig Dispense Refill  . AMLODIPINE BESYLATE 5 MG PO TABS Oral Take 5 mg by mouth daily.      . ASPIRIN EC 81 MG PO TBEC Oral Take 81 mg by mouth daily.      BP 122/88  Pulse 99  Temp(Src) 97.4 F (36.3 C) (Oral)  Resp 18  SpO2 100%  Physical Exam  Nursing note and vitals reviewed. Constitutional: He is oriented to person, place, and time. He  appears well-developed and well-nourished. No distress.  HENT:  Head: Normocephalic and atraumatic.  Mouth/Throat: Oropharynx is clear and moist. No oropharyngeal exudate.  Neck: Neck supple.  Cardiovascular: Normal rate, regular rhythm and normal heart sounds.   Pulmonary/Chest: Breath sounds normal. No respiratory distress. He has no wheezes. He has no rales. He exhibits no tenderness.  Abdominal: Soft. Bowel sounds are normal. He exhibits no distension and no mass. There is tenderness. There is no rebound and no guarding.  Musculoskeletal: Normal range of motion.  Neurological: He is alert and oriented to person, place, and time.  Skin: He is not diaphoretic.    ED Course  Procedures (including critical care time)  Labs Reviewed  CBC - Abnormal; Notable for the following:    WBC 3.3 (*)    MCHC 37.7 (*)    Platelets 141 (*)    All other components within normal limits  DIFFERENTIAL - Abnormal; Notable for the following:    Monocytes Relative 15 (*)    Neutro Abs 1.6 (*)    All other components within normal limits  BASIC METABOLIC PANEL - Abnormal; Notable for the following:    Sodium 131 (*)    Chloride 95 (*)    BUN 26 (*)    GFR calc non Af Amer 75 (*)    GFR calc Af Amer 87 (*)    All other components within normal limits  URINALYSIS, ROUTINE W REFLEX MICROSCOPIC - Abnormal; Notable for the following:    Bilirubin Urine SMALL (*)    Ketones, ur 15 (*)    All other components within normal limits   Ct Abdomen Pelvis W Contrast  01/19/2012  *RADIOLOGY REPORT*  Clinical Data: Left lower quadrant pain, diarrhea  CT ABDOMEN AND PELVIS WITH CONTRAST  Technique:  Multidetector CT imaging of the abdomen and pelvis was performed following the standard protocol during bolus administration of intravenous contrast.  Contrast: 80mL OMNIPAQUE IOHEXOL 300 MG/ML IJ SOLN  Comparison: 02/10/2011  Findings: Subpleural reticulation/patchy atelectasis at the lung bases.  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  Spleen, pancreas, and adrenal glands are within normal limits.  Gallbladder is unremarkable.  No intrahepatic or extrahepatic ductal dilatation.  Kidneys are within normal limits.  No hydronephrosis.  No evidence of bowel obstruction.  Normal appendix.  Moderate stool throughout the colon.  No colonic wall thickening or inflammatory changes.  No evidence of abdominal aortic aneurysm.  No abdominopelvic ascites.  No suspicious abdominopelvic lymphadenopathy.  Prostate is notable for a prior TURP defect.  Bladder is within normal limits.  Degenerative changes of the visualized thoracolumbar spine.  IMPRESSION: No evidence of bowel obstruction.  Normal appendix.  Moderate stool throughout the colon.  No colonic wall thickening or inflammatory changes.  Additional stable ancillary findings as above.  Original Report Authenticated By: Charline Bills, M.D.    10:56 AM I have discussed the patient with Dr Effie Shy.   1:19 PM Patient also seen by Dr Effie Shy.  Per discussion with Dr Effie Shy, patient states his diarrhea stopped 2 days ago and he has not have a bowel movement since.  Plan is for d/c home with GI follow up, pain medication and miralax for constipation.     1. Abdominal pain       MDM  Patient with abdominal pain, several days of diarrhea followed by no bowel movement.  Pt ttp LLQ - CT scan is negative for acute process, shows moderate amount of stool in colon.  Pt has hx GERD not currently taking medications, likely accounts for "lump" in his throat, which I believe is unrelated to his current problem.  Patient d/c home with f/u with Dr Russella Dar, his GI doctor and with miralax and pain medication.  To return for worsening condition.  Pt tolerating PO and pain controlled upon discharge. Patient verbalizes understanding and agrees with plan.          Rise Patience, [place] 01/19/12 1428

## 2012-02-06 ENCOUNTER — Encounter: Payer: Self-pay | Admitting: Gastroenterology

## 2012-02-06 ENCOUNTER — Ambulatory Visit (INDEPENDENT_AMBULATORY_CARE_PROVIDER_SITE_OTHER): Payer: PRIVATE HEALTH INSURANCE | Admitting: Gastroenterology

## 2012-02-06 VITALS — BP 112/76 | HR 80 | Ht 70.0 in | Wt 172.4 lb

## 2012-02-06 DIAGNOSIS — K219 Gastro-esophageal reflux disease without esophagitis: Secondary | ICD-10-CM

## 2012-02-06 DIAGNOSIS — R1032 Left lower quadrant pain: Secondary | ICD-10-CM

## 2012-02-06 DIAGNOSIS — K5900 Constipation, unspecified: Secondary | ICD-10-CM

## 2012-02-06 MED ORDER — POLYETHYLENE GLYCOL 3350 17 GM/SCOOP PO POWD: ORAL | Status: DC

## 2012-02-06 MED ORDER — RANITIDINE HCL 150 MG PO TABS: 150.0000 mg | ORAL_TABLET | Freq: Two times a day (BID) | ORAL | Status: DC

## 2012-02-06 NOTE — Patient Instructions (Signed)
We have sent the following medications to your pharmacy for you to pick up at your convenience:Ranitidine 150 mg take one tablet twice daily.  Start Miralax over the counter 17 grams in 8 oz of water 1-2 x daily for constipation.

## 2012-02-06 NOTE — Progress Notes (Signed)
History of Present Illness: This is a 71 year old male who complains of vague, generalized abdominal pain and constipation. He notes an unspecified weight loss. He also complains of dizziness and a headache. He was seen in [facility] emergency department in 01/2012 and blood work and a CT scan of the abdomen and pelvis were unrevealing except for constipation. He underwent upper endoscopy, colonoscopy and capsule endoscopy for evaluation of anemia and GI bleeding in 02/2011 showing an adenomatous colon polyp and a benign small submucosal small bowel lesion. Although he has had Barrett's identified 2007 it is not been noted on his last 2 endoscopies. He has not been compliant with recommended medications for reflux or recommendation to use MiraLax ongoing for constipation. Denies diarrhea, change in stool caliber, melena, hematochezia, nausea, vomiting, dysphagia, reflux symptoms, chest pain.  Current Medications, Allergies, Past Medical History, Past Surgical History, Family History and Social History were reviewed in [employer] record.  Physical Exam: General: Well developed , well nourished, no acute distress Head: Normocephalic and atraumatic Eyes:  sclerae anicteric, EOMI Ears: Normal auditory acuity Mouth: No deformity or lesions Lungs: Clear throughout to auscultation Heart: Regular rate and rhythm; no murmurs, rubs or bruits Abdomen: Soft, non tender and non distended. No masses, hepatosplenomegaly or hernias noted. Normal Bowel sounds Musculoskeletal: Symmetrical with no gross deformities  Pulses:  Normal pulses noted Extremities: No clubbing, cyanosis, edema or deformities noted Neurological: Alert oriented x 4, grossly nonfocal Psychological:  Alert and cooperative. Normal mood and affect  Assessment and Recommendations:  1. Constipation and generalized abdominal pain. He is recommended to use MiraLax once or twice daily on an ongoing basis for  constipation. He may use milk of magnesia as needed for refractory constipation. No further GI evaluation needed since he had an extensive GI evaluation in 2012 and an abdominal/pelvic CT scan last month.   2. GERD with a prior history of Barrett's esophagus but was not noted on last 2 endoscopies. Standard antireflux measures and increase ranitidine to 150 mg twice a day. He has not been compliant with PPI therapy despite our prior recommendations. Surveillance endoscopy recommended April 2015.  3. Personal history of adenomatous colon polyps. Surveillance colonoscopy recommended April 2017.  4. Hypertension, health maintenance and ongoing primary care. He needs a primary care physician.

## 2012-02-12 ENCOUNTER — Telehealth: Payer: Self-pay | Admitting: Gastroenterology

## 2012-02-12 NOTE — Telephone Encounter (Signed)
Patient reports that if he takes the ranitidine 150 mg Equate brand he doesn't have these side effects, but if he takes the prescription he has issues.  I have advised him they are both the same drug and if he has problems with the rx then take the Equate brand

## 2012-02-12 NOTE — Telephone Encounter (Signed)
Left message for patient to call back  

## 2012-04-14 ENCOUNTER — Ambulatory Visit (INDEPENDENT_AMBULATORY_CARE_PROVIDER_SITE_OTHER): Payer: PRIVATE HEALTH INSURANCE | Admitting: Family Medicine

## 2012-04-14 ENCOUNTER — Encounter: Payer: Self-pay | Admitting: Family Medicine

## 2012-04-14 VITALS — BP 120/79 | HR 82 | Ht 70.87 in | Wt 171.0 lb

## 2012-04-14 DIAGNOSIS — N400 Enlarged prostate without lower urinary tract symptoms: Secondary | ICD-10-CM

## 2012-04-14 DIAGNOSIS — K649 Unspecified hemorrhoids: Secondary | ICD-10-CM

## 2012-04-14 DIAGNOSIS — I10 Essential (primary) hypertension: Secondary | ICD-10-CM

## 2012-04-14 LAB — COMPREHENSIVE METABOLIC PANEL
ALT: 15 U/L (ref 0–53)
AST: 17 U/L (ref 0–37)
Calcium: 9.1 mg/dL (ref 8.4–10.5)
Chloride: 106 mEq/L (ref 96–112)
Creat: 0.85 mg/dL (ref 0.50–1.35)

## 2012-04-14 LAB — CBC
MCV: 96 fL (ref 78.0–100.0)
Platelets: 187 10*3/uL (ref 150–400)
RDW: 16.4 % — ABNORMAL HIGH (ref 11.5–15.5)
WBC: 4.9 10*3/uL (ref 4.0–10.5)

## 2012-04-14 LAB — PSA, TOTAL AND FREE: PSA, Free Pct: 45 % (ref >25)

## 2012-04-14 NOTE — Assessment & Plan Note (Signed)
Taking herbal (saw palmetto) for this. Checking PSA at pt's request. If markedly elevated, refer to urology.

## 2012-04-14 NOTE — Assessment & Plan Note (Signed)
Noted internal and external hemorrhoids on exam. Likely source of blood with wiping. Checking CBC today. Discussed miralax compliance and fiber. Discussed following up with Dr. Russella Dar if sxs persist despite treatment.

## 2012-04-14 NOTE — Assessment & Plan Note (Signed)
Blood pressure at goal. Continue current regimen. Will check general labs including CBC, CMET, direct ldl.

## 2012-04-14 NOTE — Patient Instructions (Addendum)
It was good to meet you today You have internal and externl hemorrhoids.  Try eating a high fiber diet and taking your miralax as prescribed.  Be sure to follow up with Dr. Russella Dar if you have any other concerns. I am checking some blood work.  Call if you have any questions.  Hemorrhoids Hemorrhoids are enlarged (dilated) veins around the rectum. There are 2 types of hemorrhoids, and the type of hemorrhoid is determined by its location. Internal hemorrhoids occur in the veins just inside the rectum.They are usually not painful, but they may bleed.However, they may poke through to the outside and become irritated and painful. External hemorrhoids involve the veins outside the anus and can be felt as a painful swelling or hard lump near the anus.They are often itchy and may crack and bleed. Sometimes clots will form in the veins. This makes them swollen and painful. These are called thrombosed hemorrhoids. CAUSES Causes of hemorrhoids include:  Pregnancy. This increases the pressure in the hemorrhoidal veins.   Constipation.   Straining to have a bowel movement.   Obesity.   Heavy lifting or other activity that caused you to strain.  TREATMENT Most of the time hemorrhoids improve in 1 to 2 weeks. However, if symptoms do not seem to be getting better or if you have a lot of rectal bleeding, your caregiver may perform a procedure to help make the hemorrhoids get smaller or remove them completely.Possible treatments include:  Rubber band ligation. A rubber band is placed at the base of the hemorrhoid to cut off the circulation.   Sclerotherapy. A chemical is injected to shrink the hemorrhoid.   Infrared light therapy. Tools are used to burn the hemorrhoid.   Hemorrhoidectomy. This is surgical removal of the hemorrhoid.  HOME CARE INSTRUCTIONS   Increase fiber in your diet. Ask your caregiver about using fiber supplements.   Drink enough water and fluids to keep your urine clear or  pale yellow.   Exercise regularly.   Go to the bathroom when you have the urge to have a bowel movement. Do not wait.   Avoid straining to have bowel movements.   Keep the anal area dry and clean.   Only take over-the-counter or prescription medicines for pain, discomfort, or fever as directed by your caregiver.  If your hemorrhoids are thrombosed:  Take warm sitz baths for 20 to 30 minutes, 3 to 4 times per day.   If the hemorrhoids are very tender and swollen, place ice packs on the area as tolerated. Using ice packs between sitz baths may be helpful. Fill a plastic bag with ice. Place a towel between the bag of ice and your skin.   Medicated creams and suppositories may be used or applied as directed.   Do not use a donut-shaped pillow or sit on the toilet for long periods. This increases blood pooling and pain.  SEEK MEDICAL CARE IF:   You have increasing pain and swelling that is not controlled with your medicine.   You have uncontrolled bleeding.   You have difficulty or you are unable to have a bowel movement.   You have pain or inflammation outside the area of the hemorrhoids.   You have chills or an oral temperature above 102 F (38.9 C).  MAKE SURE YOU:   Understand these instructions.   Will watch your condition.   Will get help right away if you are not doing well or get worse.  Document Released: 10/17/2000 Document Revised:  10/09/2011 Document Reviewed: 02/22/2008 [facility] Patient Information 2012 [place], [place].

## 2012-04-14 NOTE — Progress Notes (Signed)
New Patient Visit:  HPI:  Kenneth Roberts presents today for new Kenneth Roberts visit.  Kenneth Roberts comes in today with chief complaint of abdominal pain and intermittent rectal bleeding.  Patient is currently been regularly seen by Dr. Russella Dar with [facility] gastroenterology. From his notes, he's currently being treated for reflux and chronic abdominal pain/constipation. He's had upper endoscopies given history of Barrett's esophagus that have been normal x2. Patient has been prescribed MiraLAX for constipation which he does not use. Patient states that he's had some recurrent abdominal pain that's been a chronic issue with 2 episodes of blood with wiping over the weekend. Patient has a history of colonic polyps. Had last colonoscopy within the last 5-10 years per patient but is unsure. Patient denies any fevers chills, nausea vomiting, diarrhea, unintentional weight loss. Last visit with Dr. Russella Dar was April of 2013. Has yearly follow up.   From a hypertension standpoint, patient had checks his blood sugars intermittently at [employer] with systolic pressures in the 120s. Patient's currently taking Norvasc 5 mg for this on a daily basis. Patient is compliant with this.    Currently on norvasc for HTN.  No HA, CP, SOB.   Patient Active Problem List  Diagnosis  . HYPERTENSION  . GERD  . BARRETT'S ESOPHAGUS  . ABDOMINAL PAIN OTHER SPECIFIED SITE  . PERSONAL HX COLONIC POLYPS   Past Medical History: Past Medical History  Diagnosis Date  . Hypertension   . Cataract   . GERD (gastroesophageal reflux disease)   . Leukopenia   . Hiatal hernia   . BPH (benign prostatic hypertrophy)   . Barrett's esophagus without dysplasia 08/2006  . Internal hemorrhoids   . Adenomatous colon polyp 01/2009  . Fatty liver   . Hepatic hemangioma     Past Surgical History: Past Surgical History  Procedure Date  . Cataract extraction     Left eye    Social History: History   Social History  . Marital Status: Single    Spouse  Name: N/A    Number of Children: 8  . Years of Education: N/A   Occupational History  . Retired    Social History Main Topics  . Smoking status: Former Smoker    Quit date: 02/16/1981  . Smokeless tobacco: None  . Alcohol Use: 7.5 oz/week    15 drink(s) per week  . Drug Use: No  . Sexually Active: None   Other Topics Concern  . None   Social History Narrative  . None    Family History: Family History  Problem Relation Age of Onset  . Diabetes Father   . Diabetes Brother   . Diabetes Sister     Allergies: Allergies  Allergen Reactions  . Prilosec (Omeprazole) Itching    Current Outpatient Prescriptions  Medication Sig Dispense Refill  . amLODipine (NORVASC) 5 MG tablet Take 5 mg by mouth daily.        Marland Kitchen aspirin EC 81 MG tablet Take 81 mg by mouth daily.      . polyethylene glycol powder (GLYCOLAX/MIRALAX) powder Take 17 g in 8 oz of water 1-2 daily  255 g  0  . ranitidine (ZANTAC) 150 MG tablet Take 1 tablet (150 mg total) by mouth 2 (two) times daily.  60 tablet  11  . DISCONTD: dicyclomine (BENTYL) 10 MG capsule Take 1 capsule (10 mg total) by mouth 4 (four) times daily as needed.  120 capsule  5  . DISCONTD: omeprazole (PRILOSEC) 40 MG capsule Take 1 capsule (40  mg total) by mouth daily.  30 capsule  3   Current Facility-Administered Medications  Medication Dose Route Frequency Provider Last Rate Last Dose  . 0.9 %  sodium chloride infusion  500 mL Intravenous Continuous Meryl Dare, MD,FACG       Review Of Systems: Negative except as noted above in HPI.   Physical Exam: Filed Vitals:   04/14/12 0838  BP: 120/79  Pulse: 82   General: alert and cooperative HEENT: PERRLA and extra ocular movement intact Heart: S1, S2 normal, no murmur, rub or gallop, regular rate and rhythm Lungs: clear to auscultation and no wheezes or rales Abdomen: mild abdominal pain diffusely,  Rectal: noted external and internal hemorrhoids on anoscopy  Extremities:  extremities normal, atraumatic, no cyanosis or edema Skin:no rashes Neurology: normal without focal findings  Labs and Imaging: Lab Results  Component Value Date/Time   NA 131* 01/19/2012 10:44 AM   K 3.7 01/19/2012 10:44 AM   CL 95* 01/19/2012 10:44 AM   CO2 24 01/19/2012 10:44 AM   BUN 26* 01/19/2012 10:44 AM   CREATININE 1.00 01/19/2012 10:44 AM   GLUCOSE 85 01/19/2012 10:44 AM   Lab Results  Component Value Date   WBC 3.3* 01/19/2012   HGB 15.5 01/19/2012   HCT 41.1 01/19/2012   MCV 89.9 01/19/2012   PLT 141* 01/19/2012     Assessment and Plan:

## 2012-04-20 ENCOUNTER — Telehealth: Payer: Self-pay | Admitting: Family Medicine

## 2012-04-20 NOTE — Telephone Encounter (Signed)
Forward to PCP, abnormal labs

## 2012-04-20 NOTE — Telephone Encounter (Signed)
Patient is calling for results from blood work.

## 2012-04-21 NOTE — Telephone Encounter (Signed)
Called pt back and told him about low Hb. Advised recheck CBC in July or Aug. Pt expresses understanding.

## 2012-04-21 NOTE — Telephone Encounter (Signed)
Patient is calling back to find out the results of his labs.

## 2012-05-02 ENCOUNTER — Other Ambulatory Visit: Payer: Self-pay | Admitting: Cardiology

## 2012-05-06 ENCOUNTER — Encounter (HOSPITAL_COMMUNITY): Payer: Self-pay | Admitting: Emergency Medicine

## 2012-05-06 ENCOUNTER — Emergency Department (HOSPITAL_COMMUNITY)
Admission: EM | Admit: 2012-05-06 | Discharge: 2012-05-06 | Disposition: A | Discharge status: Home / Self Care | Payer: PRIVATE HEALTH INSURANCE | Attending: Emergency Medicine | Admitting: Emergency Medicine

## 2012-05-06 DIAGNOSIS — K219 Gastro-esophageal reflux disease without esophagitis: Secondary | ICD-10-CM | POA: Insufficient documentation

## 2012-05-06 DIAGNOSIS — R109 Unspecified abdominal pain: Secondary | ICD-10-CM | POA: Insufficient documentation

## 2012-05-06 DIAGNOSIS — N400 Enlarged prostate without lower urinary tract symptoms: Secondary | ICD-10-CM | POA: Insufficient documentation

## 2012-05-06 DIAGNOSIS — Z7982 Long term (current) use of aspirin: Secondary | ICD-10-CM | POA: Insufficient documentation

## 2012-05-06 DIAGNOSIS — N390 Urinary tract infection, site not specified: Secondary | ICD-10-CM | POA: Insufficient documentation

## 2012-05-06 DIAGNOSIS — R339 Retention of urine, unspecified: Secondary | ICD-10-CM

## 2012-05-06 DIAGNOSIS — I10 Essential (primary) hypertension: Secondary | ICD-10-CM | POA: Insufficient documentation

## 2012-05-06 DIAGNOSIS — Z79899 Other long term (current) drug therapy: Secondary | ICD-10-CM | POA: Insufficient documentation

## 2012-05-06 DIAGNOSIS — R141 Gas pain: Secondary | ICD-10-CM | POA: Insufficient documentation

## 2012-05-06 DIAGNOSIS — R142 Eructation: Secondary | ICD-10-CM | POA: Insufficient documentation

## 2012-05-06 DIAGNOSIS — Z87891 Personal history of nicotine dependence: Secondary | ICD-10-CM | POA: Insufficient documentation

## 2012-05-06 DIAGNOSIS — R319 Hematuria, unspecified: Secondary | ICD-10-CM | POA: Insufficient documentation

## 2012-05-06 DIAGNOSIS — R143 Flatulence: Secondary | ICD-10-CM | POA: Insufficient documentation

## 2012-05-06 LAB — CBC
MCH: 31 pg (ref 26.0–34.0)
MCV: 88.6 fL (ref 78.0–100.0)
Platelets: 195 10*3/uL (ref 150–400)
RDW: 15.6 % — ABNORMAL HIGH (ref 11.5–15.5)

## 2012-05-06 LAB — URINALYSIS, ROUTINE W REFLEX MICROSCOPIC
Bilirubin Urine: NEGATIVE
Nitrite: POSITIVE — AB
Specific Gravity, Urine: 1.01 (ref 1.005–1.030)
pH: 7.5 (ref 5.0–8.0)

## 2012-05-06 LAB — BASIC METABOLIC PANEL
CO2: 23 mEq/L (ref 19–32)
Calcium: 9.5 mg/dL (ref 8.4–10.5)
Creatinine, Ser: 1.12 mg/dL (ref 0.50–1.35)
Glucose, Bld: 111 mg/dL — ABNORMAL HIGH (ref 70–99)

## 2012-05-06 LAB — URINE MICROSCOPIC-ADD ON

## 2012-05-06 MED ORDER — DEXTROSE 5 % IV SOLN
1.0000 g | Freq: Once | INTRAVENOUS | Status: AC
  Administered 2012-05-06: 1 g via INTRAVENOUS
  Filled 2012-05-06: qty 10

## 2012-05-06 MED ORDER — CEPHALEXIN 500 MG PO CAPS: 500.0000 mg | ORAL_CAPSULE | Freq: Four times a day (QID) | ORAL | Status: AC

## 2012-05-06 NOTE — ED Provider Notes (Signed)
History     CSN: [id number]  Arrival date & time 05/06/12  0004   First MD Initiated Contact with Patient 05/06/12 0037      Chief Complaint  Patient presents with  . Hematuria    (Consider location/radiation/quality/duration/timing/severity/associated sxs/prior treatment) HPI  Patient complaining of hematuria and voiding only small amounts since last night with sensation of inability to void.  Patient denies history of urinary obstruction but has history of bph.  Denies new meds. Pain is suprapubic, nonradiating, present and worsening since last night, constant, severe, no fever or chills or prior treatment.   Past Medical History  Diagnosis Date  . Hypertension   . Cataract   . GERD (gastroesophageal reflux disease)   . Leukopenia   . Hiatal hernia   . BPH (benign prostatic hypertrophy)   . Barrett's esophagus without dysplasia 08/2006  . Internal hemorrhoids   . Adenomatous colon polyp 01/2009  . Fatty liver   . Hepatic hemangioma     Past Surgical History  Procedure Date  . Cataract extraction     Left eye    Family History  Problem Relation Age of Onset  . Diabetes Father   . Diabetes Brother   . Diabetes Sister     History  Substance Use Topics  . Smoking status: Former Smoker    Quit date: 02/16/1981  . Smokeless tobacco: Not on file  . Alcohol Use: 7.5 oz/week    15 drink(s) per week      Review of Systems  All other systems reviewed and are negative.    Allergies  Prilosec  Home Medications   Current Outpatient Rx  Name Route Sig Dispense Refill  . AMLODIPINE BESYLATE 5 MG PO TABS Oral Take 5 mg by mouth daily.      . ASPIRIN EC 81 MG PO TBEC Oral Take 81 mg by mouth daily.    Marland Kitchen RANITIDINE HCL 150 MG PO TABS Oral Take 150 mg by mouth 2 (two) times daily.    . SAW PALMETTO (SERENOA REPENS) 160 MG PO CAPS Oral Take 160 mg by mouth 2 (two) times daily.    Marland Kitchen POLYETHYLENE GLYCOL 3350 PO POWD Oral Take 17 g by mouth daily.      BP 129/76   Pulse 81  Temp 98.1 F (36.7 C) (Oral)  Resp 16  SpO2 100%  Physical Exam  Nursing note and vitals reviewed. Constitutional: He is oriented to person, place, and time. He appears well-developed and well-nourished.       Uncomfortable appearing.   HENT:  Head: Normocephalic and atraumatic.  Eyes: Pupils are equal, round, and reactive to light.  Neck: Normal range of motion. Neck supple.  Cardiovascular: Normal rate.   Abdominal:       Suprapubic tenderness and distension.   Musculoskeletal: Normal range of motion.  Neurological: He is alert and oriented to person, place, and time.  Skin: Skin is warm and dry.  Psychiatric: He has a normal mood and affect.    ED Course  Procedures (including critical care time)  Labs Reviewed  URINALYSIS, ROUTINE W REFLEX MICROSCOPIC - Abnormal; Notable for the following:    Color, Urine RED (*)  BIOCHEMICALS MAY BE AFFECTED BY COLOR   APPearance TURBID (*)     Hgb urine dipstick LARGE (*)     Ketones, ur 15 (*)     Protein, ur 100 (*)     Nitrite POSITIVE (*)     Leukocytes, UA SMALL (*)  All other components within normal limits  BASIC METABOLIC PANEL - Abnormal; Notable for the following:    Glucose, Bld 111 (*)     GFR calc non Af Amer 65 (*)     GFR calc Af Amer 76 (*)     All other components within normal limits  CBC - Abnormal; Notable for the following:    RBC 3.68 (*)     Hemoglobin 11.4 (*)     HCT 32.6 (*)     RDW 15.6 (*)     All other components within normal limits  PROTIME-INR  URINE MICROSCOPIC-ADD ON  URINE CULTURE   No results found.   No diagnosis found.    MDM  Patient with foley placed with resolution of symptoms.  Patient with uti and urine cultured.  Leg bag ordered and patient advised follow up with urology for removal in 3-5 days.  Keflex prescried.        Hilario Quarry, MD 05/06/12 [phone]

## 2012-05-06 NOTE — ED Notes (Signed)
PT. ARRIVED WITH EMS FROM HOME REPORTS HEMATURIA WITH RIGHT ABDOMEN / RIGHT FLANK PAIN ONSET LAST NIGHT , ALSO REPORTS URINARY FREQUENCY WITH SCANT BLOODY URINE.

## 2012-05-07 LAB — URINE CULTURE

## 2012-05-12 ENCOUNTER — Encounter (HOSPITAL_COMMUNITY): Payer: Self-pay

## 2012-05-12 ENCOUNTER — Emergency Department (HOSPITAL_COMMUNITY)
Admission: EM | Admit: 2012-05-12 | Discharge: 2012-05-12 | Discharge status: Against Medical Advice | Payer: PRIVATE HEALTH INSURANCE | Attending: Emergency Medicine | Admitting: Emergency Medicine

## 2012-05-12 ENCOUNTER — Emergency Department (HOSPITAL_COMMUNITY)
Admission: EM | Admit: 2012-05-12 | Discharge: 2012-05-12 | Disposition: A | Discharge status: Nursing Home (Includes ICF) | Payer: PRIVATE HEALTH INSURANCE | Source: Home / Self Care

## 2012-05-12 DIAGNOSIS — R339 Retention of urine, unspecified: Secondary | ICD-10-CM | POA: Insufficient documentation

## 2012-05-12 LAB — URINALYSIS, ROUTINE W REFLEX MICROSCOPIC
Nitrite: NEGATIVE
Specific Gravity, Urine: 1.017 (ref 1.005–1.030)
pH: 8.5 — ABNORMAL HIGH (ref 5.0–8.0)

## 2012-05-12 LAB — URINE MICROSCOPIC-ADD ON

## 2012-05-12 NOTE — ED Notes (Signed)
Unable to void, has cath in place, onset last night.

## 2012-05-12 NOTE — ED Notes (Signed)
Pt has been called twice with no answer.

## 2012-05-12 NOTE — ED Notes (Signed)
Registration was called by [facility] and pt is at [facility] will discharge pt out of system

## 2012-05-14 ENCOUNTER — Telehealth: Payer: Self-pay | Admitting: Family Medicine

## 2012-05-14 LAB — URINE CULTURE

## 2012-05-14 MED ORDER — CIPROFLOXACIN HCL 500 MG PO TABS: 500.0000 mg | ORAL_TABLET | Freq: Two times a day (BID) | ORAL | Status: AC

## 2012-05-14 NOTE — Telephone Encounter (Signed)
Called patient to let him know of pseudomonal urine culture results. Left message on machine. Will send a Rx for cipro 500mg  bid for 10 days.  Patient to stop Keflex he was given in ED.

## 2012-05-15 NOTE — ED Notes (Signed)
+   Urine Chart sent to EDP office for review. 

## 2012-05-16 NOTE — ED Notes (Addendum)
There is a phone note in the computer from pt's PCP that she is sending in a rx for Cipro-no need for further tx from our perspective per Grant Fontana PAC.

## 2012-05-31 ENCOUNTER — Telehealth: Payer: Self-pay | Admitting: *Deleted

## 2012-05-31 ENCOUNTER — Encounter: Payer: Self-pay | Admitting: Gastroenterology

## 2012-05-31 ENCOUNTER — Ambulatory Visit (INDEPENDENT_AMBULATORY_CARE_PROVIDER_SITE_OTHER): Payer: PRIVATE HEALTH INSURANCE | Admitting: Gastroenterology

## 2012-05-31 VITALS — BP 100/68 | HR 88 | Ht 70.0 in | Wt 173.4 lb

## 2012-05-31 DIAGNOSIS — R319 Hematuria, unspecified: Secondary | ICD-10-CM

## 2012-05-31 DIAGNOSIS — R350 Frequency of micturition: Secondary | ICD-10-CM

## 2012-05-31 NOTE — Patient Instructions (Addendum)
You should receive a call from [facility] regarding your referral to another Urology office. You do not hear from them please call there office regarding this referral.  cc: Marena Chancy, MD

## 2012-05-31 NOTE — Progress Notes (Signed)
History of Present Illness: This is a 71 year old male complaining of urinary frequency, urinary incontinence and an episode of hematuria. He states he has had several appointments with Dr. Brunilda Payor if his symptoms persist. He is unsure of any diagnosis made. He is very frustrated with his persistent urinary symptoms. I have reviewed his emergency department visit earlier this month. He has no gastrointestinal complaints.  Current Medications, Allergies, Past Medical History, Past Surgical History, Family History and Social History were reviewed in [employer] record.  Physical Exam: General: Well developed , well nourished, no acute distress Head: Normocephalic and atraumatic Eyes:  sclerae anicteric, EOMI Ears: Normal auditory acuity Mouth: No deformity or lesions Lungs: Clear throughout to auscultation Heart: Regular rate and rhythm; no murmurs, rubs or bruits Abdomen: Soft, non tender and non distended. No masses, hepatosplenomegaly or hernias noted. Normal Bowel sounds Musculoskeletal: Symmetrical with no gross deformities  Pulses:  Normal pulses noted Extremities: No clubbing, cyanosis, edema or deformities noted Neurological: Alert oriented x 4, grossly nonfocal Psychological:  Alert and cooperative. Normal mood and affect  Assessment and Recommendations:  1. Urinary frequency, urinary incontinence, hematuria. I advised him to contact Dr. Gwenlyn Saran to determine appropriate followup and management for his urologic problem.

## 2012-05-31 NOTE — Telephone Encounter (Signed)
Patient was seen by Dr. Russella Dar at [facility] and is frustrated with his care at [facility] and is requesting a referral to another Urologist. Dr. Russella Dar would like Dr. Gwenlyn Saran to address this since she is his primary MD.  Will route Dr. Dr Gwenlyn Saran.  [facility] is the only Urology practice in town so he would need to go to [place] or [place].  Kenneth Roberts

## 2012-05-31 NOTE — Telephone Encounter (Signed)
Pt called wanting [place] to call [facility] to set an appt with another doctor.  He didn't understand that he could call to make the call himself and I help explain that he could make the call.

## 2012-06-01 NOTE — Telephone Encounter (Signed)
Mr. Slayden calling back about urology referral.  I called [facility] and they are happy to schedule him to see someone else but he must call Dawn in the billing department first at [phone] ext [phone].  Information given to Mr. Biehler to call.  Ileana Ladd

## 2012-06-29 ENCOUNTER — Encounter: Payer: Self-pay | Admitting: Gastroenterology

## 2012-08-02 ENCOUNTER — Telehealth: Payer: Self-pay | Admitting: Gastroenterology

## 2012-08-02 NOTE — Telephone Encounter (Signed)
Patient reports weight loss about 15 lbs and per-umbilical pain.  He reports he did follow up with urology and all his urinary frequency symptoms have resolved.  He wants an appt with Dr. Russella Dar.  He is scheduled for 08/18/12 3:15

## 2012-08-06 ENCOUNTER — Ambulatory Visit: Payer: PRIVATE HEALTH INSURANCE | Admitting: Family Medicine

## 2012-08-11 ENCOUNTER — Ambulatory Visit: Payer: PRIVATE HEALTH INSURANCE | Admitting: Family Medicine

## 2012-08-18 ENCOUNTER — Ambulatory Visit (INDEPENDENT_AMBULATORY_CARE_PROVIDER_SITE_OTHER): Payer: PRIVATE HEALTH INSURANCE | Admitting: Gastroenterology

## 2012-08-18 ENCOUNTER — Encounter: Payer: Self-pay | Admitting: Gastroenterology

## 2012-08-18 VITALS — BP 130/90 | HR 90 | Ht 70.0 in | Wt 175.0 lb

## 2012-08-18 DIAGNOSIS — R109 Unspecified abdominal pain: Secondary | ICD-10-CM

## 2012-08-18 NOTE — Progress Notes (Signed)
History of Present Illness: This is a 71 year old male with complaints of mid abdominal pain and groin pain. He has been previously seen for vague abdominal complaints and constipation. He states he no longer has constipation his bowels are moving regularly. His abdominal pain and groin pain occur intermittently but do not appear to be related to any particular time of day or digestive function. He states it is fairly. He cannot determine a pattern or relationship to any other events or meals. He has had an extensive gastrointestinal evaluation over the past few years that has been unremarkable. He underwent colonoscopy, upper endoscopy and capsule endoscopy in April 2012 with a small adenomatous colon polyp and internal hemorrhoids noted. He underwent CT scan of the abdomen/pelvis in 01/2012 which showed increased colonic stool. He underwent a CT of the abdomen/pelvis in 02/2011 which was unremarkable. He had a CTA in 05/2009 which was unremarkable. He had abdominal ultrasound in 05/2009 which was normal.    Current Medications, Allergies, Past Medical History, Past Surgical History, Family History and Social History were reviewed in [employer] record.   Physical Exam: General: Well developed , well nourished, no acute distress, alcohol on breath Head: Normocephalic and atraumatic Eyes:  sclerae anicteric, EOMI Ears: Normal auditory acuity Mouth: No deformity or lesions Lungs: Clear throughout to auscultation Heart: Regular rate and rhythm; no murmurs, rubs or bruits Abdomen: Soft, non tender and non distended. No masses, hepatosplenomegaly or hernias noted. Normal Bowel sounds Musculoskeletal: Symmetrical with no gross deformities  Pulses:  Normal pulses noted Extremities: No clubbing, cyanosis, edema or deformities noted Neurological: Alert oriented x 4, easily distracted from topics being discussed, appears frustrated, appears mildly intoxicated. Psychological:  Alert and  cooperative. Normal mood and affect  Assessment and Recommendations:  1. Mild abdominal pain. He has had extensive gastrointestinal evaluation over the past few years which has been unremarkable. I cannot find a gastrointestinal cause of his symptoms. His pain is likely related to abdominal wall pain or functional abdominal pain. We have discussed this at several office visits however he does not appear to recall those discussions. He remains very concerned about his symptoms. I have attempted to reassure him once again on the benign nature of the symptoms. Advised to followup with his primary physician. No further gastrointestinal evaluation is indicated at this time.  2. Groin pain urologic and primary care followup.   3. Personal history of adenomatous colon polyps. Surveillance colonoscopy recommended April 2017.

## 2012-09-01 ENCOUNTER — Encounter: Payer: Self-pay | Admitting: Family Medicine

## 2012-09-01 ENCOUNTER — Ambulatory Visit (INDEPENDENT_AMBULATORY_CARE_PROVIDER_SITE_OTHER): Payer: PRIVATE HEALTH INSURANCE | Admitting: Family Medicine

## 2012-09-01 VITALS — BP 158/78 | HR 92 | Ht 70.0 in | Wt 173.0 lb

## 2012-09-01 DIAGNOSIS — I10 Essential (primary) hypertension: Secondary | ICD-10-CM

## 2012-09-01 NOTE — Assessment & Plan Note (Signed)
Initially elevated in office at 158/78. Repeat was 148/80. BP has not been significantly elevated in the past. Asked patient to monitor BP at pharmacy and call the clinic to let me know what BP readings have been. At that point, will see if there is a need to increase amlodipine from 5mg  to 10mg .  In context of HTN, patient evaluated in the past for hyperlipidemia with LDL of 85 in 04/2012. No need to repeat today. Also, regarding patient's concern for diabetes, recent non fasting BMP showed glucose of 111 which is within normal range. No further workup necessary at this point.

## 2012-09-01 NOTE — Progress Notes (Signed)
Patient ID: Mckinnley Smithey    DOB: Nov 24, 1940, 71 y.o.   MRN: [id number] --- Subjective:  Benaiah is a 71 y.o.male who presents to "check his sugar". He denies any history of diabetes. He denies any polyuria, polydypsia, change in vision.  HTN: he has been checking his blood pressure at the pharmacy which he reports has been in the 120-130's systolic. He denies any chest pain, headache, shortness of breath or lower extremity edema.    ROS: see HPI Past Medical History: reviewed and updated medications and allergies. Social History: Tobacco: not currently  Objective: Filed Vitals:   09/01/12 1318  BP: 158/78  Pulse: 92    Physical Examination:   General appearance - alert, well appearing, and in no distress Neck - supple, no significant adenopathy Chest - clear to auscultation, no wheezes, rales or rhonchi, symmetric air entry Heart - normal rate, regular rhythm, normal S1, S2, no murmurs, rubs, clicks or gallops Abdomen - soft, nontender, nondistended, no masses or organomegaly Extremities - peripheral pulses normal, no pedal edema

## 2012-12-14 ENCOUNTER — Ambulatory Visit (INDEPENDENT_AMBULATORY_CARE_PROVIDER_SITE_OTHER): Payer: PRIVATE HEALTH INSURANCE | Admitting: Family Medicine

## 2012-12-14 ENCOUNTER — Encounter: Payer: Self-pay | Admitting: Family Medicine

## 2012-12-14 VITALS — BP 107/82 | HR 82 | Temp 98.0°F | Ht 70.0 in | Wt 180.0 lb

## 2012-12-14 DIAGNOSIS — R634 Abnormal weight loss: Secondary | ICD-10-CM

## 2012-12-14 NOTE — Progress Notes (Signed)
Patient ID: Vanna Shavers, male   DOB: 02/11/1941, 72 y.o.   MRN: [id number] Patient presented today for a same day appointment.  Appointment was made yesterday at 3pm.  When I saw him, I asked him what he was wanting to talk about today.  He said "She didn't write it down?  What did she say?"  I explained to him that he had made an appointment yesterday as a same-day appointment because the front desk staff had felt he was feeling bad and needed to see a physician right away.  In response to this, all he would say was "She was supposed to check my blood sugar, PSA, and cholesterol.  Something isn't right here.  She didn't write it down?  I'm leaving."  He left the room and nursing reports he was cursing as he left.  No exam was done.

## 2012-12-20 ENCOUNTER — Emergency Department (HOSPITAL_COMMUNITY): Payer: PRIVATE HEALTH INSURANCE

## 2012-12-20 ENCOUNTER — Emergency Department (HOSPITAL_COMMUNITY)
Admission: EM | Admit: 2012-12-20 | Discharge: 2012-12-20 | Disposition: A | Discharge status: Home / Self Care | Payer: PRIVATE HEALTH INSURANCE | Attending: Emergency Medicine | Admitting: Emergency Medicine

## 2012-12-20 ENCOUNTER — Encounter (HOSPITAL_COMMUNITY): Payer: Self-pay | Admitting: Emergency Medicine

## 2012-12-20 DIAGNOSIS — I10 Essential (primary) hypertension: Secondary | ICD-10-CM | POA: Insufficient documentation

## 2012-12-20 DIAGNOSIS — Z87891 Personal history of nicotine dependence: Secondary | ICD-10-CM | POA: Insufficient documentation

## 2012-12-20 DIAGNOSIS — Z8669 Personal history of other diseases of the nervous system and sense organs: Secondary | ICD-10-CM | POA: Insufficient documentation

## 2012-12-20 DIAGNOSIS — Z8719 Personal history of other diseases of the digestive system: Secondary | ICD-10-CM | POA: Insufficient documentation

## 2012-12-20 DIAGNOSIS — K859 Acute pancreatitis without necrosis or infection, unspecified: Secondary | ICD-10-CM | POA: Insufficient documentation

## 2012-12-20 DIAGNOSIS — R109 Unspecified abdominal pain: Secondary | ICD-10-CM | POA: Insufficient documentation

## 2012-12-20 DIAGNOSIS — Z862 Personal history of diseases of the blood and blood-forming organs and certain disorders involving the immune mechanism: Secondary | ICD-10-CM | POA: Insufficient documentation

## 2012-12-20 DIAGNOSIS — Z87448 Personal history of other diseases of urinary system: Secondary | ICD-10-CM | POA: Insufficient documentation

## 2012-12-20 DIAGNOSIS — Z860100 Personal history of colon polyps, unspecified: Secondary | ICD-10-CM | POA: Insufficient documentation

## 2012-12-20 DIAGNOSIS — Z79899 Other long term (current) drug therapy: Secondary | ICD-10-CM | POA: Insufficient documentation

## 2012-12-20 LAB — CBC
MCH: 31.7 pg (ref 26.0–34.0)
Platelets: 201 10*3/uL (ref 150–400)
RBC: 4.19 MIL/uL — ABNORMAL LOW (ref 4.22–5.81)
WBC: 2.6 10*3/uL — ABNORMAL LOW (ref 4.0–10.5)

## 2012-12-20 LAB — URINALYSIS, ROUTINE W REFLEX MICROSCOPIC
Leukocytes, UA: NEGATIVE
Protein, ur: 30 mg/dL — AB
Urobilinogen, UA: 0.2 mg/dL (ref 0.0–1.0)

## 2012-12-20 LAB — COMPREHENSIVE METABOLIC PANEL
ALT: 14 U/L (ref 0–53)
AST: 40 U/L — ABNORMAL HIGH (ref 0–37)
CO2: 32 mEq/L (ref 19–32)
Calcium: 8.6 mg/dL (ref 8.4–10.5)
GFR calc non Af Amer: 81 mL/min — ABNORMAL LOW (ref >90)
Sodium: 140 mEq/L (ref 135–145)

## 2012-12-20 LAB — POCT I-STAT, CHEM 8
Creatinine, Ser: 1.6 mg/dL — ABNORMAL HIGH (ref 0.50–1.35)
Hemoglobin: 13.6 g/dL (ref 13.0–17.0)
Sodium: 141 mEq/L (ref 135–145)
TCO2: 34 mmol/L (ref 0–100)

## 2012-12-20 LAB — URINE MICROSCOPIC-ADD ON: Urine-Other: NONE SEEN

## 2012-12-20 MED ORDER — HYDROMORPHONE HCL PF 1 MG/ML IJ SOLN
1.0000 mg | Freq: Once | INTRAMUSCULAR | Status: AC
  Administered 2012-12-20: 1 mg via INTRAVENOUS
  Filled 2012-12-20: qty 1

## 2012-12-20 MED ORDER — SODIUM CHLORIDE 0.9 % IV SOLN
1000.0000 mL | INTRAVENOUS | Status: DC
  Administered 2012-12-20: 1000 mL via INTRAVENOUS

## 2012-12-20 MED ORDER — OXYCODONE HCL 5 MG PO TABS: 5.0000 mg | ORAL_TABLET | Freq: Four times a day (QID) | ORAL | Status: DC | PRN

## 2012-12-20 MED ORDER — IOHEXOL 300 MG/ML  SOLN
50.0000 mL | Freq: Once | INTRAMUSCULAR | Status: AC | PRN
  Administered 2012-12-20: 50 mL via ORAL

## 2012-12-20 MED ORDER — IOHEXOL 300 MG/ML  SOLN
100.0000 mL | Freq: Once | INTRAMUSCULAR | Status: AC | PRN
  Administered 2012-12-20: 100 mL via INTRAVENOUS

## 2012-12-20 MED ORDER — SODIUM CHLORIDE 0.9 % IV SOLN
1000.0000 mL | Freq: Once | INTRAVENOUS | Status: AC
  Administered 2012-12-20: 1000 mL via INTRAVENOUS

## 2012-12-20 NOTE — ED Notes (Signed)
[id number]<AV> Expected date:<BR> Expected time:<BR> Means of arrival:<BR> Comments:<BR> Medic 261, 72 M, Abd Pain

## 2012-12-20 NOTE — ED Provider Notes (Signed)
History     CSN: [id number]  Arrival date & time 12/20/12  0127   First MD Initiated Contact with Patient 12/20/12 [phone]      Chief Complaint  Patient presents with  . Abdominal Pain     The history is provided by the patient.   the patient reports he was drinking alcohol this evening and was) have intercourse when he developed suprapubic and upper abdominal pain.  No nausea or vomiting.  No fevers or chills.  No melena or hematochezia.  No urinary complaints.  He states his pain in both areas is mild to moderate in severity.  He states he did not have this pain earlier in the day.  He does drink daily.  He no longer smokes cigarettes.  No chest pain or shortness of breath. nothing improves his symptoms.  His pain is worsened by movement and palpation of his abdomen.  Past Medical History  Diagnosis Date  . Hypertension   . Cataract   . GERD (gastroesophageal reflux disease)   . Leukopenia   . Hiatal hernia   . BPH (benign prostatic hypertrophy)   . Barrett's esophagus without dysplasia 08/2006  . Internal hemorrhoids   . Adenomatous colon polyp 01/2009  . Fatty liver   . Hepatic hemangioma   . Anemia     Past Surgical History  Procedure Laterality Date  . Cataract extraction      Left eye    Family History  Problem Relation Age of Onset  . Diabetes Father   . Diabetes Brother   . Diabetes Sister     History  Substance Use Topics  . Smoking status: Former Smoker    Quit date: 02/16/1981  . Smokeless tobacco: Never Used  . Alcohol Use: 7.5 oz/week    15 drink(s) per week      Review of Systems  Gastrointestinal: Positive for abdominal pain.  All other systems reviewed and are negative.    Allergies  Prilosec  Home Medications   Current Outpatient Rx  Name  Route  Sig  Dispense  Refill  . amLODipine (NORVASC) 5 MG tablet   Oral   Take 5 mg by mouth daily.           . ranitidine (ZANTAC) 150 MG tablet   Oral   Take 150 mg by mouth 2 (two) times  daily.         . saw palmetto 160 MG capsule   Oral   Take 160 mg by mouth 2 (two) times daily.         Marland Kitchen terazosin (HYTRIN) 10 MG capsule   Oral   Take 10 mg by mouth daily.         Marland Kitchen oxyCODONE (ROXICODONE) 5 MG immediate release tablet   Oral   Take 1 tablet (5 mg total) by mouth every 6 (six) hours as needed for pain.   12 tablet   0     BP 122/77  Pulse 62  Temp(Src) 98.4 F (36.9 C) (Oral)  SpO2 92%  Physical Exam  Nursing note and vitals reviewed. Constitutional: He is oriented to person, place, and time. He appears well-developed and well-nourished.  HENT:  Head: Normocephalic and atraumatic.  Eyes: EOM are normal.  Neck: Normal range of motion.  Cardiovascular: Normal rate, regular rhythm, normal heart sounds and intact distal pulses.   Pulmonary/Chest: Effort normal and breath sounds normal. No respiratory distress.  Abdominal: Soft. He exhibits no distension.  Mild epigastric tenderness and  mild suprapubic tenderness.  No guarding or rebound.  No peritonitis on exam  Musculoskeletal: Normal range of motion.  Neurological: He is alert and oriented to person, place, and time.  Skin: Skin is warm and dry.  Psychiatric: He has a normal mood and affect. Judgment normal.    ED Course  Procedures (including critical care time)  Labs Reviewed  URINALYSIS, ROUTINE W REFLEX MICROSCOPIC - Abnormal; Notable for the following:    APPearance TURBID (*)    pH 8.5 (*)    Protein, ur 30 (*)    All other components within normal limits  CBC - Abnormal; Notable for the following:    WBC 2.6 (*)    RBC 4.19 (*)    HCT 36.4 (*)    MCHC 36.5 (*)    RDW 16.3 (*)    All other components within normal limits  COMPREHENSIVE METABOLIC PANEL - Abnormal; Notable for the following:    Potassium 3.4 (*)    Chloride 95 (*)    AST 40 (*)    Alkaline Phosphatase 32 (*)    Total Bilirubin 0.2 (*)    GFR calc non Af Amer 81 (*)    All other components within normal limits   LIPASE, BLOOD - Abnormal; Notable for the following:    Lipase 103 (*)    All other components within normal limits  ETHANOL - Abnormal; Notable for the following:    Alcohol, Ethyl (B) 364 (*)    All other components within normal limits  POCT I-STAT, CHEM 8 - Abnormal; Notable for the following:    Potassium 3.4 (*)    Creatinine, Ser 1.60 (*)    Calcium, Ion 0.92 (*)    All other components within normal limits  URINE MICROSCOPIC-ADD ON   Ct Abdomen Pelvis W Contrast  12/20/2012  *RADIOLOGY REPORT*  Clinical Data: Abdominal pain  CT ABDOMEN AND PELVIS WITH CONTRAST  Technique:  Multidetector CT imaging of the abdomen and pelvis was performed following the standard protocol during bolus administration of intravenous contrast.  Contrast: OMNIPAQUE IOHEXOL 300 MG/ML  SOLN  Comparison: 01/19/2012, 05/03/2009  Findings: Linear opacities at the bases, favor atelectasis and/or scarring.  Right middle lobe bullae versus cyst.  Heart size within normal limits. Small hiatal hernia.  Diffuse low attenuation of the liver with a more focal hypodensity adjacent to the falciform, favored to reflect fatty infiltration. 2.2 cm hypervascular lesion within segment II/IVa is most in keeping with a hemangioma.  Unremarkable spleen and left adrenal gland. Nodularity along the lateral limb right adrenal gland without a measurable nodule, similar to priors.  No radiodense gallstones.  Mild CBD prominence at 8 mm.  Mild pancreatic main ductal prominence at 5 mm.  The pancreas appears mildly edematous, without peripancreatic fat stranding or fluid. Homogeneous enhancement.  Symmetric renal enhancement.  No hydronephrosis or hydroureter.  No CT evidence for colitis, normal appendix.  Small bowel loops are normal course and caliber.  Normal caliber aorta and branch vessels.  Partially decompressed bladder.  Multilevel degenerative changes of the imaged spine. No acute or aggressive appearing osseous lesion.    IMPRESSION: Mild pancreatic and biliary ductal prominence to the level of the ampulla.  No obstructing lesion is visualized. Appearance is similar to July, 2010. Correlate with LFTs and ERCP / MRCP if warranted  Hepatic steatosis and left hepatic lobe hemangioma, unchanged.  There may be mild pancreatic edema without evidence for necrosis or pseudocyst.  Correlate with lipase.  Original Report Authenticated By: Jearld Lesch, M.D.    I personally reviewed the imaging tests through PACS system I reviewed available ER/hospitalization records through the EMR   1. Abdominal pain   2. Pancreatitis       MDM  The patient has mild epigastric pain and some inflammatory changes around his pancreas to suggest pancreatitis.  His lipase is greater than 100.  This is likely early pancreatitis.  The patient is tolerating oral fluids in the emergency apartment.  Home with 24 hour clear liquid diet instructions.  Home with pain medicine.  Understand to return the ER for new or worsening symptoms        Lyanne Co, MD 12/20/12 [phone]

## 2012-12-20 NOTE — ED Notes (Signed)
CT notified pt completed CM

## 2012-12-20 NOTE — ED Notes (Signed)
As per EMS pt sts during intercourse developed abd pain.rates pain 8/10. No chest pain or SOB. Pt ambulated to room.alert and oriented.No masses or distention.

## 2012-12-20 NOTE — ED Notes (Signed)
Patient transported to CT 

## 2012-12-20 NOTE — ED Notes (Signed)
MD at bedside. 

## 2012-12-23 ENCOUNTER — Ambulatory Visit (INDEPENDENT_AMBULATORY_CARE_PROVIDER_SITE_OTHER): Payer: PRIVATE HEALTH INSURANCE | Admitting: Family Medicine

## 2012-12-23 ENCOUNTER — Encounter: Payer: Self-pay | Admitting: Family Medicine

## 2012-12-23 VITALS — BP 132/92 | HR 61 | Ht 70.0 in | Wt 175.5 lb

## 2012-12-23 DIAGNOSIS — K859 Acute pancreatitis without necrosis or infection, unspecified: Secondary | ICD-10-CM

## 2012-12-23 DIAGNOSIS — N400 Enlarged prostate without lower urinary tract symptoms: Secondary | ICD-10-CM

## 2012-12-23 DIAGNOSIS — I10 Essential (primary) hypertension: Secondary | ICD-10-CM

## 2012-12-23 LAB — COMPREHENSIVE METABOLIC PANEL
ALT: 12 U/L (ref 0–53)
Albumin: 4.5 g/dL (ref 3.5–5.2)
CO2: 27 mEq/L (ref 19–32)
Calcium: 9.8 mg/dL (ref 8.4–10.5)
Chloride: 98 mEq/L (ref 96–112)
Potassium: 3.7 mEq/L (ref 3.5–5.3)
Sodium: 135 mEq/L (ref 135–145)
Total Protein: 7.5 g/dL (ref 6.0–8.3)

## 2012-12-23 LAB — LIPID PANEL: Cholesterol: 209 mg/dL — ABNORMAL HIGH (ref 0–200)

## 2012-12-23 NOTE — Assessment & Plan Note (Signed)
Resolved. Talked about importance of quitting alcohol which patient seemed to understand.

## 2012-12-23 NOTE — Assessment & Plan Note (Addendum)
BP today of 132/92. BP on 2/11 and 2/17 was 107/82 and 122/77 respectively. Patient on amlodipine 5mg  daily and terazosin 10mg  daily. Will hold on increasing BP med at this time for DBP>90. Patient was instructed to log blood pressure 2-3 times weekly and return in 1 month to evaluate for any change in therapy.  Fasting lipid panel and CMP today.

## 2012-12-23 NOTE — Patient Instructions (Addendum)
I am not going to increase your blood pressure medicine, but please keep a record of your blood pressures 2-3 times per week and come back in 1 month with your record. We will see then whether we need to increase the medicine.

## 2012-12-23 NOTE — Progress Notes (Signed)
Patient ID: Kenneth Roberts    DOB: [birth year], 72 y.o.   MRN: [id number] --- Subjective:  Kenneth Roberts is a 72 y.o.male who presents for annual checkup.  - was recently seen in ED with abdominal pain thought to be secondary to pancreatitis with lipase of 103. Since then, abdominal pain has resolved. No nausea, no vomiting. Eating and drinking well. He has stopped drinking alcohol as well.  - HTN: checks BP regularly at home and it runs between 120-130/80-90.  Denies any shortness of breath, no chest pain. No lower extremity swelling.    ROS: see HPI Past Medical History: reviewed and updated medications and allergies. Social History: Tobacco: quit 5 years ago. Smoked from 72yo to 43 yo.   Objective: Filed Vitals:   12/23/12 0837  BP: 144/92  Pulse: 61   Repeat BP: 132/92 Physical Examination:   General appearance - alert, well appearing, and in no distress Nose - normal and patent, no erythema, discharge or polyps Mouth - mucous membranes moist, pharynx normal without lesions Neck - supple, no significant adenopathy Chest - clear to auscultation, no wheezes, rales or rhonchi, symmetric air entry Heart - normal rate, regular rhythm, normal S1, S2, no murmurs, rubs, clicks or gallops Abdomen - soft, nontender, nondistended, no masses or organomegaly Extremities - no pedal edema

## 2012-12-23 NOTE — Assessment & Plan Note (Signed)
PSA obtained today at patient's request

## 2012-12-27 ENCOUNTER — Encounter: Payer: Self-pay | Admitting: Family Medicine

## 2012-12-27 ENCOUNTER — Telehealth: Payer: Self-pay | Admitting: Family Medicine

## 2012-12-27 MED ORDER — ROSUVASTATIN CALCIUM 10 MG PO TABS: 10.0000 mg | ORAL_TABLET | Freq: Every day | ORAL | Status: DC

## 2012-12-27 NOTE — Telephone Encounter (Signed)
Called and left message on voicemail stating that lab results were overall normal. The only thing was cholesterol being a little high. I recommend starting a medicine: crestor 10mg  for it. Will send Rx to pharmacy. Will also send letter to patient with results.

## 2013-01-03 ENCOUNTER — Telehealth: Payer: Self-pay | Admitting: Family Medicine

## 2013-01-03 NOTE — Telephone Encounter (Signed)
Patient is calling to find out the results of his blood work.

## 2013-01-04 NOTE — Telephone Encounter (Signed)
Will fwd. To PCP for review .Slade, [place]  

## 2013-01-05 ENCOUNTER — Telehealth: Payer: Self-pay | Admitting: Family Medicine

## 2013-01-05 NOTE — Telephone Encounter (Signed)
Pt had CT on his liver lungs & kidneys - can't remember what the results were

## 2013-01-05 NOTE — Telephone Encounter (Signed)
Called patient and left a message letting him know that overall results were normal, except for the cholesterol which was a little high. Will send a prescription for crestor for him to take. Follow up in 3 months.  Had also sent a letter with results and information about cholesterol.

## 2013-01-05 NOTE — Telephone Encounter (Signed)
Will forward to Dr Losq 

## 2013-01-06 ENCOUNTER — Telehealth: Payer: Self-pay | Admitting: Family Medicine

## 2013-01-06 NOTE — Telephone Encounter (Signed)
Called patient to review findings of CT scan which were done in the ED when he had episode of pancreatitis. Let him know of fatty area in liver, inflammation around pancreas, likely from pancreatitis and atelectasis vs scarring in lung bases. Discussed importance of quitting alcohol to avoid recurrence of pancreatitis.  Also reiterated that I sent Rx for crestor given elevated chol.  Patient expressed understanding.

## 2013-01-10 NOTE — Telephone Encounter (Signed)
Fwd. To PCP to address .Slade, [place]  

## 2013-01-10 NOTE — Telephone Encounter (Signed)
Has this been addressed yet?

## 2013-01-12 NOTE — Telephone Encounter (Signed)
This has been addressed. Please see telephone note from 01/06/13.

## 2013-01-18 ENCOUNTER — Telehealth: Payer: Self-pay | Admitting: Family Medicine

## 2013-01-18 NOTE — Telephone Encounter (Signed)
Patient is calling because he is still having a lot of aches and pains in his stomach and he would like to talk to Dr. Gwenlyn Saran about them.

## 2013-01-18 NOTE — Telephone Encounter (Signed)
Called pt and advised to schedule OV with Dr.Losq to discuss stomach pain. Pt agreed and sched. OV for 01/27/13. fyi from Andric Kerce

## 2013-01-18 NOTE — Telephone Encounter (Signed)
Called pt.

## 2013-01-27 ENCOUNTER — Ambulatory Visit: Payer: PRIVATE HEALTH INSURANCE | Admitting: Family Medicine

## 2013-02-03 ENCOUNTER — Ambulatory Visit (INDEPENDENT_AMBULATORY_CARE_PROVIDER_SITE_OTHER): Payer: PRIVATE HEALTH INSURANCE | Admitting: Family Medicine

## 2013-02-03 ENCOUNTER — Encounter: Payer: Self-pay | Admitting: Family Medicine

## 2013-02-03 VITALS — BP 135/80 | HR 81 | Temp 98.4°F | Ht 70.0 in | Wt 174.1 lb

## 2013-02-03 DIAGNOSIS — R109 Unspecified abdominal pain: Secondary | ICD-10-CM

## 2013-02-03 LAB — COMPREHENSIVE METABOLIC PANEL
ALT: 12 U/L (ref 0–53)
AST: 15 U/L (ref 0–37)
CO2: 27 mEq/L (ref 19–32)
Calcium: 9.9 mg/dL (ref 8.4–10.5)
Chloride: 100 mEq/L (ref 96–112)
Sodium: 136 mEq/L (ref 135–145)
Total Protein: 6.6 g/dL (ref 6.0–8.3)

## 2013-02-03 LAB — LIPASE: Lipase: 64 U/L (ref 0–75)

## 2013-02-03 MED ORDER — ESOMEPRAZOLE MAGNESIUM 20 MG PO CPDR: 20.0000 mg | DELAYED_RELEASE_CAPSULE | Freq: Every day | ORAL | Status: DC

## 2013-02-03 NOTE — Assessment & Plan Note (Addendum)
Patient with GI symptoms and has had extensive work-up in the past, which is outline in HPI. Recently with pancreatitis likely related to alcohol use. DDx includes pancreatitis vs liver dysfunction vs GERD vs functional abdominal pain vs MSK abdominal wall pain. Advised patient that if this is related to his liver or pancreas it could be caused by his history of alcohol use. Plan: will check lipase and CMET to evaluate for pancreatic and hepatic causes. Advised that we might not get an answer from these tests, but they would help [place] rule-out any worrisome causes. Advised patient to try nexium, though patient very concerned with side effect profile. When advised of side effects and low percentage of people who experience them, the patient stated not everyone is the same and he refused the nexium prescription. Patient to f/u with PCP next week.

## 2013-02-03 NOTE — Patient Instructions (Signed)
Nice to meet you today. Sorry you're stomach has been hurting. We will get some labs that to see if this pain is related to you pancreas or liver. We will set you up with a follow-up appointment with Dr. Gwenlyn Saran to review these labs. If your pain worsens, you start vomiting, or develop worsening nausea please seek medical attention.

## 2013-02-03 NOTE — Progress Notes (Signed)
  Subjective:    Patient ID: Kenneth Roberts, male    DOB: [birth year], 72 y.o.   MRN: [id number]  Abdominal Pain  Patient is a 72 yo male who presents with complaints of abdominal pain for the past several months.  States for the last several months his stomach has been bothering him. Upper and lower abdomen. States it hurts, can't give better description. Is eating ok. Not associated with eating. Endorses minimal nausea. Denies vomiting. Had diarrhea for 3 weeks last month, but this has resolved and currently with normal BMs. No radiation. Nothing makes it worse. Has tried OTC antacid (unable to give name) without benefit. Per our records his weight has been stable over the past year. Had 2 beers yesterday and states he had much to drink recently prior to these beers. Denies dysuria or urinary frequency. States occasionally has issues with getting urinary stream started, though feels this is related to BPH. Denies sour taste in back of mouth or sensation of reflux.  Notably patient seen in ED in February for pancreatitis with lipase of 103. Was sent home with clear liquid diet and pain management. This had resolved by time of f/u with Dr. Gwenlyn Saran several days later.  Has had extensive GI w/u in the past. He underwent colonoscopy, upper endoscopy and capsule endoscopy in April 2012 with a small adenomatous colon polyp and internal hemorrhoids noted. He underwent CT scan of the abdomen/pelvis in 01/2012 which showed increased colonic stool. He underwent a CT of the abdomen/pelvis in 02/2011 which was unremarkable. He had a CTA in 05/2009 which was unremarkable. He had abdominal ultrasound in 05/2009 which was normal.   Review of Systems  Gastrointestinal: Positive for abdominal pain.   see HPI     Objective:   Physical Exam  Constitutional: He appears well-developed and well-nourished.  HENT:  Head: Normocephalic and atraumatic.  Cardiovascular: Normal rate, regular rhythm and normal heart sounds.    Pulmonary/Chest: Effort normal and breath sounds normal.  Abdominal: Soft. Bowel sounds are normal. He exhibits no distension and no mass (no stool burden felt). There is tenderness (in epigastric and suprapubic region). There is guarding. There is no rebound.  BP 135/80  Pulse 81  Temp(Src) 98.4 F (36.9 C) (Oral)  Ht 5\' 10"  (1.778 m)  Wt 174 lb 1.6 oz (78.971 kg)  BMI 24.98 kg/m2    Assessment & Plan:

## 2013-02-10 ENCOUNTER — Telehealth: Payer: Self-pay | Admitting: Family Medicine

## 2013-02-10 NOTE — Telephone Encounter (Signed)
Pt is asking for results of labs - OK to leave results on VM

## 2013-02-10 NOTE — Telephone Encounter (Signed)
Called patient and left message on phone letting him know that his CMP and lipase were normal.   Marena Chancy, PGY-2 Family Medicine Resident

## 2013-02-10 NOTE — Telephone Encounter (Signed)
Will fwd. To PCP for review .Kenneth Roberts  

## 2013-03-14 ENCOUNTER — Ambulatory Visit: Payer: PRIVATE HEALTH INSURANCE | Admitting: Family Medicine

## 2013-03-15 ENCOUNTER — Telehealth: Payer: Self-pay | Admitting: Family Medicine

## 2013-03-15 NOTE — Telephone Encounter (Signed)
Nurse with [facility] agency is calling about the referral that needs to be completed before the start of care for this patient can be done.  She is going to refax the form with Dr. Willaim Rayas name on it.

## 2013-03-17 NOTE — Telephone Encounter (Signed)
I have not received the form yet.

## 2013-03-25 ENCOUNTER — Ambulatory Visit (INDEPENDENT_AMBULATORY_CARE_PROVIDER_SITE_OTHER): Payer: PRIVATE HEALTH INSURANCE | Admitting: Family Medicine

## 2013-03-25 ENCOUNTER — Encounter: Payer: Self-pay | Admitting: Family Medicine

## 2013-03-25 VITALS — BP 107/66 | HR 85 | Temp 98.3°F | Ht 70.0 in | Wt 172.0 lb

## 2013-03-25 DIAGNOSIS — R109 Unspecified abdominal pain: Secondary | ICD-10-CM

## 2013-03-25 LAB — POCT URINALYSIS DIPSTICK
Blood, UA: NEGATIVE
Leukocytes, UA: NEGATIVE
Nitrite, UA: NEGATIVE
Protein, UA: NEGATIVE
pH, UA: 6

## 2013-03-25 MED ORDER — LEVOFLOXACIN 500 MG PO TABS: 500.0000 mg | ORAL_TABLET | Freq: Every day | ORAL | Status: DC

## 2013-03-25 NOTE — Patient Instructions (Addendum)
I am sorry you are having so much pain. I think you have a lot of inflammation of your prostate and likely an infection. I would like for you to start an antibiotic to treat this and follow up in 1-2 weeks to see how you are doing.  If you are not better, we will look for other causes.  Zurri Rudden M. Kdyn Vonbehren, M.D.

## 2013-03-25 NOTE — Assessment & Plan Note (Signed)
Pain radiates to groin (or vice versa.) No sign of hernia or testicular abnormality. UA checked but does not show gross infection. Since he is a symptomatic male, will send for culture anyway. He did have prostate tenderness, and given his symptoms, I am going to treat for acute prostatitis with Levaquin 500mg  daily x21 days. Pt very hesitant to this treatment and was demanding to know side effects, etc. I have asked patient to return in 1-2 weeks for follow up. If he does not have any improvement with treatment, would consider referral for surgery for possible hernia evaluation since I cannot truly rule out inguinal hernia.

## 2013-03-25 NOTE — Progress Notes (Signed)
Patient ID: Kenneth Roberts, male   DOB: 1941-02-15, 72 y.o.   MRN: [id number]  [facility] Dynisha Due M. Quasean Frye, MD Phone: [phone]   Subjective: HPI: Patient is a 72 y.o. male presenting to clinic today for same day appointment for lower abdominal pain and bilateral testicle pain.  This pain has been going on intermittently for a few months. Was seen by gastro last fall and had a colonoscopy a few years ago by Dr. Russella Dar. This abdominal pain radiates down to both testicles. Reports he has to press on his bladder for the urine to come out. Gets up multiple time (8-15x) to get up to go to the restroom. Reports burning at the tip of his penis before he urinates, then the burning is gone. Abdominal pain and groin pain does not go away with urination. Reports regular bowel movements. No fevers, no major weight loss.   Of note, he has been evaluated by multiple doctors as well as GI for abdominal pain with no clear cause. Had has colonoscopy as well as abd CT. This does seem to be bothersome for patient that he does not have an answer  History Reviewed: Former smoker.  ROS: Please see HPI above.  Objective: Office vital signs reviewed. BP 107/66  Pulse 85  Temp(Src) 98.3 F (36.8 C) (Oral)  Ht 5\' 10"  (1.778 m)  Wt 172 lb (78.019 kg)  BMI 24.68 kg/m2  Physical Examination:  General: Awake, alert. Lying on table when I entered room, but in NAD Pulm: CTAB, no wheezes Cardio: RRR, no murmurs appreciated Abdomen:+BS, soft. TTP with guarding periumbilical. Mild TTP suprapubic. No rebound. No masses GU: Testes descended bilaterally with TTP midline. Testes wnl. No scrotal swelling. Left side feels normal, but able to palpate inguinal ring on the right but I did not feel a hernia with bearing down.  Rectal: Anus WNL. Prostate very TTP. No nodules appreciated and does not feel much enlarged, but pt endorses a lot of pain with palpation.  Extremities: No edema Neuro:  Grossly intact  Assessment: 72 y.o. male with abdominal and groin pain  Plan: See Problem List and After Visit Summary

## 2013-03-30 ENCOUNTER — Telehealth: Payer: Self-pay | Admitting: Family Medicine

## 2013-03-30 NOTE — Telephone Encounter (Signed)
LMOVM for pt to return my call.  Kenneth Roberts, Darlyne Russian, CMA

## 2013-03-30 NOTE — Telephone Encounter (Signed)
Please let patient know that his urine culture was negative. Continue taking antibiotic as prescribed and we will follow up later this week,  Thank you, Rocio Wolak M. Jennyfer Nickolson, M.D.

## 2013-04-15 ENCOUNTER — Ambulatory Visit: Payer: PRIVATE HEALTH INSURANCE | Admitting: Family Medicine

## 2013-04-20 ENCOUNTER — Ambulatory Visit (INDEPENDENT_AMBULATORY_CARE_PROVIDER_SITE_OTHER): Payer: PRIVATE HEALTH INSURANCE | Admitting: Family Medicine

## 2013-04-20 ENCOUNTER — Encounter: Payer: Self-pay | Admitting: Family Medicine

## 2013-04-20 VITALS — BP 126/82 | HR 81 | Ht 70.0 in | Wt 176.0 lb

## 2013-04-20 DIAGNOSIS — R109 Unspecified abdominal pain: Secondary | ICD-10-CM

## 2013-04-20 NOTE — Progress Notes (Signed)
S: Pt comes in today for abdominal pain.  He has been evaluated by multiple doctors as well as GI for abdominal pain with no clear cause. Had has colonoscopy as well as abd CT.  He was seen 5/23 for lower abd/groin pain and started on 21 day course of levaquin for possible acute prostatitis.  Since that time, he reports he only took the Levaquin for 1 week because it causes difficulties with his sex life (in ability to achieve and maintain an erection).  His pain is the same as it has always been, remain mid epigastric/mid abdominal and suprapubic.  Denies fevers/chills, last BM was this AM-- has been having 2-3 loose stools per day for the past 2-3 weeks- no blood, not melanous, no pain with defecation.  Denies nausea and vomiting, has a good appetite and is eating well.  No chest pain or SOB. No weight loss.  Pain is not associated with anything, and nothing makes it better or worse.  His last CT was in 12/2012, labs including lipase 02/2013 and GI appointment 08/2012.  He is noncompliant with any and all therapies Rx'ed for him but is quite hostile and demands to know what is causing his pain.  He reports his reason for coming today as "so you can tell me what is causing this pain."    ROS: Per HPI  History  Smoking status  . Former Smoker  . Quit date: 02/16/1981  Smokeless tobacco  . Never Used    O:  Filed Vitals:   04/20/13 1036  BP: 126/82  Pulse: 81    Gen: NAD CV: RRR, no murmur Pulm: CTA bilat, no wheezes or crackles Abd: soft, mild suprapubic TTP but non-distended, normoactive BS, normal in appearance, no scars   A/P: 72 y.o. male p/w chronic abd pain -See problem list -f/u in 1 month with PCP

## 2013-04-20 NOTE — Patient Instructions (Addendum)
It was nice to meet you today.  I think the best next step is to see the functional GI doctors at [facility].  I'm not sure what to do to help your pain.    Come back to see Dr. Gwenlyn Saran in 1 month so she can discuss if you have decided you would like to be seen at [facility].  If so, she will need to send in a referral.  If you decide that you would like to be seen at [facility] sooner, just call and we can get the ball rolling.

## 2013-04-20 NOTE — Assessment & Plan Note (Signed)
Appears to be a very chronic problem, with extensive work up, most recent GI visit 08/2012, CT abd 12/2012, lab work 02/2013.  Only other thing I can think to try is referring to [facility] for Functional GI and Motility Disorders.  Advised pt to think about this and f/u with PCP in 1 month.  No red flags on exam or history. Pt very irritated that there was not a diagnosis made today for his pain.  Unclear if he will seriously consider [facility] as an option.

## 2013-05-20 ENCOUNTER — Encounter: Payer: Self-pay | Admitting: Family Medicine

## 2013-05-20 ENCOUNTER — Ambulatory Visit (INDEPENDENT_AMBULATORY_CARE_PROVIDER_SITE_OTHER): Payer: PRIVATE HEALTH INSURANCE | Admitting: Family Medicine

## 2013-05-20 VITALS — BP 122/70 | HR 72 | Ht 70.0 in | Wt 172.0 lb

## 2013-05-20 DIAGNOSIS — R109 Unspecified abdominal pain: Secondary | ICD-10-CM

## 2013-05-20 DIAGNOSIS — R0789 Other chest pain: Secondary | ICD-10-CM

## 2013-05-20 DIAGNOSIS — R51 Headache: Secondary | ICD-10-CM

## 2013-05-20 MED ORDER — PANTOPRAZOLE SODIUM 40 MG PO TBEC: 40.0000 mg | DELAYED_RELEASE_TABLET | Freq: Every day | ORAL | Status: DC

## 2013-05-20 MED ORDER — AMLODIPINE BESYLATE 5 MG PO TABS: 5.0000 mg | ORAL_TABLET | Freq: Every day | ORAL | Status: DC

## 2013-05-20 NOTE — Patient Instructions (Addendum)
Let's try the medicine for heartburn and see how you do with it. If you start having side effects like itching or rash, stop the medicine.   Please follow up in 2-3 weeks to see how the medicine has helped.

## 2013-05-22 DIAGNOSIS — R0789 Other chest pain: Secondary | ICD-10-CM | POA: Insufficient documentation

## 2013-05-22 DIAGNOSIS — R519 Headache, unspecified: Secondary | ICD-10-CM | POA: Insufficient documentation

## 2013-05-22 DIAGNOSIS — R51 Headache: Secondary | ICD-10-CM | POA: Insufficient documentation

## 2013-05-22 NOTE — Assessment & Plan Note (Signed)
Not present during office visit. No focal findings on neuro exam. Patient to try tyelenol for headache if it happens again. No need for neuroimaging at this time.  Reviewed red flags for return: weakness, worsening headache.Kenneth KitchenMarland Roberts

## 2013-05-22 NOTE — Assessment & Plan Note (Signed)
Not present in clinic today. Doesn't appear cardiac in nature, given fleeting, sharp pain, non exertional. No evidence of PE either.  Red flags for return reviewed.

## 2013-05-22 NOTE — Progress Notes (Signed)
Patient ID: Kenneth Roberts    DOB: [birth year], 72 y.o.   MRN: [id number] --- Subjective:  Kenneth Roberts is a 72 y.o.male with h/o BPH, GERD, HTN who presents for follow up on chronic abdominal pain.  His concerns include the following:   - abdominal pain: states that it's the same as always. When pressed for more details, he points to epigastric and periumbilical area. He states that it's 8/10 when it occurs, intermittent, every day. Unrelated to eating. No loss of appetite. Some nausea, no vomiting. He states he had 4 weeks of diarrhea, non bloody, non melanous, resolved a couple of days ago. This has been ongoing for several months. No recent weight loss.   - headache: occurred a couple days ago, now resolved. Located at back of head. Throbbing pain. Not associated with change in vision. No photophobia or phonophobia. no weakness. No associated nausea or vomiting. Has not had a headache like this before.    - chest discomfort: intermittent, sharp shooting pain, lasts a couple minutes and goes away. Not currently having chest pain. No associated shortness of breath, diaphoresis or nausea. No worsening with exertion.    ROS: see HPI Past Medical History: reviewed and updated medications and allergies. Social History: Tobacco: former smoker  Objective: Filed Vitals:   05/20/13 1026  BP: 122/70  Pulse: 72    Physical Examination:   General appearance - alert, angry appearing, not pleasant, in no acute distress Chest - clear to auscultation, no wheezes, rales or rhonchi, symmetric air entry Heart - normal rate, regular rhythm, normal S1, S2, no murmurs Abdomen - soft, tender in epigastric and periumbilical region. No rebound or guarding, no hepatomegaly.  Extremities - no edema Neuro: CN2-12 grossly intact, normal strength in upper and lower extremities.

## 2013-05-22 NOTE — Assessment & Plan Note (Signed)
Ongoing and chronic problem. Unclear etiology. Differential includes gastritis vs IBD vs diverticulitis. However, with colonoscopy from 2012 without evidence of diverticulosis, diverticulitis less likely. IBD is possibility given complaint of diarrhea which is recurrent upon review of previous notes. But again less likely given overall normal colonoscopy in 2012. Hemoglobin form feb 2014 was normal.  Consider at next visit to get repeat CBC, ESR, CRP, ferritin, folate, B12 and vitamin D.  For now, given epigastric complaint and tenderness on exam, will try protonix for gastritis. Unfortunately, patient had itching with prilosec in the past and very concerned about side effects of medications. I told him that protonix is a related medicine to prilosec but that he should give it a try and that if he has adverse effect to it, to stop it.  If this problem continues to persist without resolution, will refer patient back to GI. On previous visit, recommendation was made for him to see [facility] for functional GI and motility disorder but he was very angry by this stating that he had no way of getting there.

## 2013-06-08 ENCOUNTER — Encounter (HOSPITAL_COMMUNITY): Payer: Self-pay | Admitting: Physical Medicine and Rehabilitation

## 2013-06-08 ENCOUNTER — Emergency Department (HOSPITAL_COMMUNITY)
Admission: EM | Admit: 2013-06-08 | Discharge: 2013-06-08 | Disposition: A | Discharge status: Home / Self Care | Payer: PRIVATE HEALTH INSURANCE | Attending: Emergency Medicine | Admitting: Emergency Medicine

## 2013-06-08 DIAGNOSIS — R109 Unspecified abdominal pain: Secondary | ICD-10-CM | POA: Insufficient documentation

## 2013-06-08 DIAGNOSIS — G8929 Other chronic pain: Secondary | ICD-10-CM | POA: Insufficient documentation

## 2013-06-08 DIAGNOSIS — R197 Diarrhea, unspecified: Secondary | ICD-10-CM | POA: Insufficient documentation

## 2013-06-08 DIAGNOSIS — Z79899 Other long term (current) drug therapy: Secondary | ICD-10-CM | POA: Insufficient documentation

## 2013-06-08 DIAGNOSIS — Z862 Personal history of diseases of the blood and blood-forming organs and certain disorders involving the immune mechanism: Secondary | ICD-10-CM | POA: Insufficient documentation

## 2013-06-08 DIAGNOSIS — Z860100 Personal history of colon polyps, unspecified: Secondary | ICD-10-CM | POA: Insufficient documentation

## 2013-06-08 DIAGNOSIS — K219 Gastro-esophageal reflux disease without esophagitis: Secondary | ICD-10-CM | POA: Insufficient documentation

## 2013-06-08 DIAGNOSIS — L299 Pruritus, unspecified: Secondary | ICD-10-CM | POA: Insufficient documentation

## 2013-06-08 DIAGNOSIS — R110 Nausea: Secondary | ICD-10-CM | POA: Insufficient documentation

## 2013-06-08 DIAGNOSIS — Z87891 Personal history of nicotine dependence: Secondary | ICD-10-CM | POA: Insufficient documentation

## 2013-06-08 DIAGNOSIS — Z87448 Personal history of other diseases of urinary system: Secondary | ICD-10-CM | POA: Insufficient documentation

## 2013-06-08 DIAGNOSIS — B749 Filariasis, unspecified: Secondary | ICD-10-CM | POA: Insufficient documentation

## 2013-06-08 DIAGNOSIS — Z8669 Personal history of other diseases of the nervous system and sense organs: Secondary | ICD-10-CM | POA: Insufficient documentation

## 2013-06-08 DIAGNOSIS — I10 Essential (primary) hypertension: Secondary | ICD-10-CM | POA: Insufficient documentation

## 2013-06-08 DIAGNOSIS — Z8719 Personal history of other diseases of the digestive system: Secondary | ICD-10-CM | POA: Insufficient documentation

## 2013-06-08 LAB — COMPREHENSIVE METABOLIC PANEL
ALT: 8 U/L (ref 0–53)
AST: 14 U/L (ref 0–37)
Alkaline Phosphatase: 25 U/L — ABNORMAL LOW (ref 39–117)
CO2: 28 mEq/L (ref 19–32)
Chloride: 104 mEq/L (ref 96–112)
GFR calc non Af Amer: 66 mL/min — ABNORMAL LOW (ref >90)
Potassium: 3.8 mEq/L (ref 3.5–5.1)
Sodium: 140 mEq/L (ref 135–145)
Total Bilirubin: 0.5 mg/dL (ref 0.3–1.2)

## 2013-06-08 LAB — CBC
MCV: 90.5 fL (ref 78.0–100.0)
Platelets: 134 10*3/uL — ABNORMAL LOW (ref 150–400)
RBC: 4.23 MIL/uL (ref 4.22–5.81)
WBC: 2.8 10*3/uL — ABNORMAL LOW (ref 4.0–10.5)

## 2013-06-08 MED ORDER — DIPHENHYDRAMINE HCL 25 MG PO CAPS: 25.0000 mg | ORAL_CAPSULE | Freq: Once | ORAL | Status: DC

## 2013-06-08 MED ORDER — HYDROXYZINE HCL 25 MG PO TABS: 25.0000 mg | ORAL_TABLET | Freq: Three times a day (TID) | ORAL | Status: DC | PRN

## 2013-06-08 MED ORDER — HYDROXYZINE HCL 25 MG PO TABS
25.0000 mg | ORAL_TABLET | Freq: Once | ORAL | Status: AC
  Administered 2013-06-08: 25 mg via ORAL
  Filled 2013-06-08: qty 1

## 2013-06-08 NOTE — ED Provider Notes (Signed)
I saw and evaluated the patient with the resident physician, Dr. Adriana Simas.  I saw and interpreted relevant studies and EKG, as necessary.  I agree with the interpretation. The documentation is appropriate and accurate.  Gerhard Munch, MD 06/08/13 [phone]

## 2013-06-08 NOTE — ED Notes (Signed)
Pt left before receiving discharge paperwork. 

## 2013-06-08 NOTE — ED Notes (Signed)
Pt presents to department for evaluation of itching all over body. Also states chronic abdominal pain and diarrhea. Pt is seen at family practice clinic. States itching has been going on for several months, no relief with medications/creams. Pt is alert and oriented x4. Respirations unlabored.

## 2013-06-08 NOTE — ED Provider Notes (Signed)
CSN: [id number]     Arrival date & time 06/08/13  1478 History     First MD Initiated Contact with Patient 06/08/13 0703     Chief Complaint  Patient presents with  . Pruritis   HPI 72 year old male with HTN, GERD, and chronic abdominal pain of unknown etiology presents to the ED with pruritis.  Patient reports that this has been present for "a while" (months to years).  He spoke with an on call family medicine resident and was told to come in for evaluation.  He states that he "itches from head to toe."  He also reports nausea, and frequent diarrhea.  Currently denies any other complaints.  No chest pain, SOB, abdominal pain, fever, chills.   Past Medical History  Diagnosis Date  . Hypertension   . Cataract   . GERD (gastroesophageal reflux disease)   . Leukopenia   . Hiatal hernia   . BPH (benign prostatic hypertrophy)   . Barrett's esophagus without dysplasia 08/2006  . Internal hemorrhoids   . Adenomatous colon polyp 01/2009  . Fatty liver   . Hepatic hemangioma   . Anemia    Past Surgical History  Procedure Laterality Date  . Cataract extraction      Left eye   Family History  Problem Relation Age of Onset  . Diabetes Father   . Diabetes Brother   . Diabetes Sister    History  Substance Use Topics  . Smoking status: Former Smoker    Quit date: 02/16/1981  . Smokeless tobacco: Never Used  . Alcohol Use: 7.5 oz/week    15 drink(s) per week    Review of Systems  Constitutional: Negative for fever and chills.  Respiratory: Negative for chest tightness and shortness of breath.   Cardiovascular: Negative for chest pain and leg swelling.  Gastrointestinal: Positive for nausea and diarrhea. Negative for vomiting, constipation and abdominal distention.       Reports frequent abdominal pain.  Skin: Negative for rash.     Allergies  Prilosec  Home Medications   Current Outpatient Rx  Name  Route  Sig  Dispense  Refill  . amLODipine (NORVASC) 5 MG tablet  Oral   Take 5 mg by mouth daily.          Marland Kitchen levofloxacin (LEVAQUIN) 500 MG tablet   Oral   Take 500 mg by mouth daily.         . mirabegron ER (MYRBETRIQ) 50 MG TB24 tablet   Oral   Take 50 mg by mouth daily.         Marland Kitchen OVER THE COUNTER MEDICATION   Oral   Take 2 tablets by mouth daily as needed (acid reflex).         . pantoprazole (PROTONIX) 40 MG tablet   Oral   Take 40 mg by mouth daily as needed (acid reflex).         . hydrOXYzine (ATARAX/VISTARIL) 25 MG tablet   Oral   Take 1 tablet (25 mg total) by mouth 3 (three) times daily as needed for itching.   30 tablet   0    BP 125/80  Pulse 79  Temp(Src) 97.8 F (36.6 C) (Oral)  SpO2 99% Physical Exam  Constitutional: He is oriented to person, place, and time. He appears well-developed and well-nourished. No distress.  HENT:  Head: Normocephalic and atraumatic.  Eyes: No scleral icterus.  Cardiovascular: Normal rate and regular rhythm.   No murmur heard. Pulmonary/Chest: Effort  normal and breath sounds normal. No respiratory distress. He has no wheezes. He has no rales.  Abdominal: Soft. Bowel sounds are normal. He exhibits no distension and no mass. There is no rebound and no guarding.  Slight tenderness to palpation in suprapubic region. Negative Murphy's sign.  Musculoskeletal: He exhibits no edema.  Neurological: He is alert and oriented to person, place, and time.  Skin: Skin is warm and dry.   ED Course   Procedures (including critical care time)  Labs Reviewed  CBC - Abnormal; Notable for the following:    WBC 2.8 (*)    HCT 38.3 (*)    Platelets 134 (*)    All other components within normal limits  COMPREHENSIVE METABOLIC PANEL - Abnormal; Notable for the following:    Alkaline Phosphatase 25 (*)    GFR calc non Af Amer 66 (*)    GFR calc Af Amer 77 (*)    All other components within normal limits  LIPASE, BLOOD - Abnormal; Notable for the following:    Lipase 80 (*)    All other  components within normal limits   No results found. 1. Pruritus     MDM  72 year old male with HTN, GERD, and chronic abdominal pain of unknown etiology presents to the ED with pruritis. - Patient hemodynamically stable. - DDX for pruritis is vast and includes cholestasis, renal disease, medication induced. - Will obtain CBC, CMP, Lipase. - If negative can discharge home with close PCP follow and symptomatic treatment.  0920 - Lipase mildly elevated but remainder of labs unremarkable.  Will discharge home with close follow up with Family medicine.  Tommie Sams, DO 06/08/13 1610  Tommie Sams, DO 06/08/13 [phone]

## 2013-06-22 ENCOUNTER — Telehealth: Payer: Self-pay | Admitting: *Deleted

## 2013-06-22 NOTE — Telephone Encounter (Signed)
I usually don't change PCP to a 3rd-year resident, but he requested you.  I will await response back from Dr. Gwenlyn Saran and will call patient back and inform of info below.  Gaylene Brooks, RN

## 2013-06-22 NOTE — Telephone Encounter (Signed)
Patient completed request for PCP change form.  States he would like to have Dr. Tana Conch as PCP.  Would like a male doctor.  Will route request to Dr. Durene Cal for approval or can change to another male PCP here at [facility].  Will call patient back.  Gaylene Brooks, RN

## 2013-06-22 NOTE — Telephone Encounter (Signed)
I am fine with this change if Dr. Gwenlyn Saran is.  I would ask that patient be made aware of 2 things: 1. I will only be here for 10 more months.  2. For his chronic abdominal pain, I will follow the plan laid out by Dr. Gwenlyn Saran from her previous notes.   Thanks, Tana Conch

## 2013-06-22 NOTE — Telephone Encounter (Signed)
I have no problem with this and am not surprised. He was not very responsive to my suggestions at his last visit and seemed quite angry. I have not been able to figure out the cause of his abdominal pain and a new set of eyes may help.

## 2013-06-28 NOTE — Telephone Encounter (Signed)
Returned call to patient and left message to call our office back.  Richardson, Jeannette Ann, RN  

## 2013-06-28 NOTE — Telephone Encounter (Signed)
Patient called office back.  Discussed PCP options with patient per Drs. Losq & Hunter.  Will change PCP to one who will be here longer than Dr. Durene Cal.  Appt made with Dr. Casper Harrison to establish new PCP on 07/06/13 at 8:30 am and f/u for abd pain.  Gaylene Brooks, RN

## 2013-07-06 ENCOUNTER — Ambulatory Visit: Payer: PRIVATE HEALTH INSURANCE | Admitting: Family Medicine

## 2013-07-13 ENCOUNTER — Ambulatory Visit: Payer: PRIVATE HEALTH INSURANCE | Admitting: Family Medicine

## 2013-07-21 ENCOUNTER — Ambulatory Visit (INDEPENDENT_AMBULATORY_CARE_PROVIDER_SITE_OTHER): Payer: PRIVATE HEALTH INSURANCE | Admitting: Family Medicine

## 2013-07-21 ENCOUNTER — Encounter: Payer: Self-pay | Admitting: Family Medicine

## 2013-07-21 VITALS — BP 142/90 | HR 63 | Ht 70.0 in | Wt 181.0 lb

## 2013-07-21 DIAGNOSIS — Z860100 Personal history of colon polyps, unspecified: Secondary | ICD-10-CM

## 2013-07-21 DIAGNOSIS — K219 Gastro-esophageal reflux disease without esophagitis: Secondary | ICD-10-CM

## 2013-07-21 DIAGNOSIS — I10 Essential (primary) hypertension: Secondary | ICD-10-CM

## 2013-07-21 DIAGNOSIS — N400 Enlarged prostate without lower urinary tract symptoms: Secondary | ICD-10-CM

## 2013-07-21 DIAGNOSIS — R109 Unspecified abdominal pain: Secondary | ICD-10-CM

## 2013-07-21 DIAGNOSIS — E785 Hyperlipidemia, unspecified: Secondary | ICD-10-CM

## 2013-07-21 NOTE — Assessment & Plan Note (Addendum)
Reports compliance with Myrbetriq. PSA in February not elevated. Urologic pathology could be contributing to chronic abdominal pain, given suprapubic tenderness, etc. Could consider urology referral if symptoms worsen or do not improve with further GI work-up/therapies.

## 2013-07-21 NOTE — Assessment & Plan Note (Signed)
A: Ongoing, chronic in nature, of uncertain etiology. DDx includes chronic gastritis/pancreatitis, diverticulitis, inflammatory bowel disease, irritable bowel syndrome. Previous work-up discounts some of these; abdominal CT in Feb 2014 showed small hiatal hernia, 2.2 cm stable liver hemangioma, ?mild pancreatic edema. Colonoscopy in 2012 showed some polyps, one with tubular adenoma, but overall was unremarkable otherwise. Also possible is urologic pathology, given known BPH and suprapubic tenderness on exam, today (previous notes indicated more epigastric tenderness, not as apparent on my exam, today). No relief with PPI, though some help with OTC antacid.  P: Pt re-referred to GI per his request and for potential further endoscopic work-up; at this point, referral seems appropriate as he has not had relief with conservative measures and does have prior hx of polyps, etc. Of note, pt was fixated on repeat colonoscopy, and I informed him that that would have to be at the discretion of GI providers. He has seen Dr. Russella Dar in the past and requested that I forward today's notes to him. Will plan to f/u in 2-3 months, or as needed. Appreciate any input from GI. Will also consider referral to urology, depending on further work-up/treatments.

## 2013-07-21 NOTE — Assessment & Plan Note (Signed)
Seen on colonoscopy 2012, 2 sessile and one tubular adenoma. Re-referred to GI today. See also abdominal pain progress note.

## 2013-07-21 NOTE — Progress Notes (Signed)
Subjective:    Patient ID: Kenneth Roberts, male    DOB: 1940/11/11, 72 y.o.   MRN: [id number]  HPI: Pt presents to clinic for general follow up and to meet his new PCP (reassigned from Dr. Gwenlyn Saran), and to f/u on chronic abdominal pain  Abd pain - chronic in nature, uncertain cause; has had CT scan earlier this year which showed some mild pancreatic edema and known/stable left liver lobe hemangioma -previous providers voiced concern for IBD, but pt has not seen GI "in a while" (see immediately below) -similar in nature for the last several months, vaguely-described, central/low abdomen, constant -associated with mostly watery diarrhea, with occasional constipation (but "not often") -has had colonoscopy in the past showing polyps; pt has not seen GI since then but is very interested in colonoscopy to be repeated  -pt is focused on "finding out what's wrong," "I'm not a doctor but I know something's wrong"  -pt states, "I've been to the ED, they didn't do anything. They didn't tell me about tests, they just throw medicine at you, they didn't figure out anything"  -pt also states, "I was told that I had polyps that were rare and could turn into cancer and I need a repeat colonoscopy sooner than normal" -of note, pt does have known BPH and takes Myrbetriq, and sometimes has difficulty with weak urine stream; has never seen urology  GERD - possible contribution to the above symptoms; generally no specific reflux complaints currently, though -did not tolerate Prilosec well and did not have relief with Protonix, so stopped taking -takes OTC "antacid" medicine but is unsure exactly what type --> helps reflux symptoms some, but pt thinks it may contribute to diarrhea  HTN - reports compliance with amlodipine, no headaches, chest pain, palpitations, or LE swelling  HLD - last lipid panel in February, pt states results were not reviewed with him (were explained today, see A&P) -has never been on statin  medication or other lipid agent and is hesitant to start medications/take medications in general  Pt is a former smoker. Urinary symptoms are worse with alcohol; pt states he drinks "maybe a pint of alcohol a week, less lately." In addition to the above documentation, pt's PMH, surgical history, FH, and SH all reviewed and updated where appropriate in the EMR. I have also reviewed and updated the pt's allergies and current medications as appropriate.  Review of Systems: As above. Generally feels "okay." Otherwise, full 12-system ROS was reviewed and all negative.      Objective:   Physical Exam BP 142/90  Pulse 63  Ht 5\' 10"  (1.778 m)  Wt 181 lb (82.101 kg)  BMI 25.97 kg/m2 Gen: well-appearing adult male in NAD; very forthright but not confrontational HEENT: [place]/AT, sclerae/conjunctivae clear, no lid lag, EOMI, PERRLA   MMM, posterior oropharynx clear, no cervical lymphadenopathy  neck supple with full ROM, no masses appreciated; thyroid not enlarged  Cardio: RRR, no murmur appreciated; distal pulses intact/symmetric Pulm: CTAB, no wheezes, normal WOB  Abd: soft, nondistended, BS+, no HSM; mild tenderness in RLQ  Most tenderness is epigastric, some suprapubic and low low-lateral bilaterally  No frank peritoneal signs, no Murphy or Rosving signs, no CVA tenderness Ext: warm/well-perfused, no cyanosis/clubbing/edema MSK: strength 5/5 in all four extremities, no frank joint deformity/effusion  normal ROM to all four extremities with no point muscle/bony tenderness in spine Neuro/Psych: alert/oriented, sensation grossly intact; normal gait/balance  mood euthymic with congruent though slightly anxious/intense affect       Assessment &  Plan:

## 2013-07-21 NOTE — Patient Instructions (Signed)
Thank you for coming in, today! I put in an order for you to be referred back to the GI doctors at [facility].  Their office should call you with an appointment time.  If you don't hear from them in about 1-2 weeks, call them.  Their number is [phone].  Continue your medications without any changes, today. Think about if you want to start the medicine for cholesterol. This medicine (a "statin" medicine) can help protect against heart attacks and strokes. The biggest side effect is muscle pain, which is rare. Most people tolerate the medicine well.  Make an appointment to come back to see me in 2-3 months, after you see the GI doctor. (or sooner if you need). Please feel free to call with any questions or concerns at any time, at [phone]. --Dr. Casper Harrison

## 2013-07-21 NOTE — Assessment & Plan Note (Signed)
BP today 142/90, mildly elevated. Has had some fluctuations in the past, 100's-120's over 70's-80's. Pain possibly contributing to mild elevation today. Continue amlodipine. Consider addition of another agent in the future at f/u, vs increase amlodipine; pt hesitant to start medication or really to take much medication in general.

## 2013-07-21 NOTE — Assessment & Plan Note (Signed)
Last lipid panel in February showed total cholesterol 209, LDL 104, HDL 93. Pt has 10-year ASCVD risk of 19+%, with recommendation for moderate to high intensity statin; would favor Lipitor 40 mg daily. Discussed extensively with pt. He is very concerned about adverse reactions and possible affects on his liver; counseled that medication is generally well-tolerated and that the most common, though relatively rare, side effect is muscle pain, etc. Advised him that statin therapy can help protect against heart attack, stroke, etc. Advised pt to consider this and will re-discuss at f/u.

## 2013-07-21 NOTE — Assessment & Plan Note (Signed)
Not well-relieved with PPI's in the past (and pt did not tolerate Prilosec due to itching). No current specific complaint, though gastritis is on the differential for chronic abd pain. Currently taking OTC antacid with some relief. Continue this for now; requested pt bring in medications next visit to specify on med list.

## 2013-07-22 ENCOUNTER — Telehealth: Payer: Self-pay

## 2013-07-22 NOTE — Telephone Encounter (Signed)
Lady Gary will mark him a do not schedule.

## 2013-07-22 NOTE — Telephone Encounter (Signed)
Message copied by Annett Fabian on Fri Jul 22, 2013  4:45 PM ------      Message from: Claudette Head T      Created: Fri Jul 22, 2013  3:58 PM       If he will go to the tertiary GI referral appts as arranged by his PCP instead of appts with me we don't need to discharge. If that is not acceptable we need to discharge.            ----- Message -----         From: Rossie Muskrat, RN         Sent: 07/22/2013  11:20 AM           To: Meryl Dare, MD, Arna Snipe, #            We will need to discharge him if you don't want him on your schedule any longer.  Do you want to do that?      ----- Message -----         From: Meryl Dare, MD         Sent: 07/22/2013  10:55 AM           To: Rossie Muskrat, RN            Please do not schedule this patient and please remove him from my schedule if he already has an appt. I have asked his pcp to refer him elsewhere. Unfortunately I have nothing more to offer him.             ------

## 2013-08-04 ENCOUNTER — Telehealth: Payer: Self-pay | Admitting: Family Medicine

## 2013-08-04 NOTE — Telephone Encounter (Signed)
Pt wonders why he hasnt heard from the referral to Dr Russella Dar Please advise. Prior notes from Dr Russella Dar show he doesn't want him as a pt

## 2013-08-08 NOTE — Telephone Encounter (Signed)
Will fwd to Dr. Casper Harrison.  Laysa Kimmey, Darlyne Russian, CMA

## 2013-08-08 NOTE — Telephone Encounter (Signed)
Will fwd to Dr. Casper Harrison.  Please review notes from Dr. Russella Dar.  Corinthia Helmers, Darlyne Russian, CMA

## 2013-08-08 NOTE — Telephone Encounter (Signed)
Left message on pt's answering machine explaining that Dr. Ardell Isaacs statement was that he felt that his office did not have much more to offer pt at this time, and reiterated potential referral to a tertiary center to see GI specialist there, and that I am happy to place that referral; I did state in my message that I'm not sure what/if there will be any barriers to getting him to one of these centers. I also reiterated that pt is welcome at any time to follow up in the office with me to discuss this. I stated in my message that I will attempt to call him tomorrow to talk in person, as well. Thanks. --CMS

## 2013-08-08 NOTE — Telephone Encounter (Signed)
Kenneth Roberts called again. He is still wanted to know why Dr Russella Dar "dropped him".  The lady at Dr Anselm Jungling office would not answer his question. Pt was told what the note from Dr Casper Harrison said but he wants to talk to Dr Casper Harrison himself.

## 2013-08-08 NOTE — Telephone Encounter (Addendum)
I corresponded with Dr. Russella Dar briefly, and he feels that a tertiary center referral [facility] [place], [place]) would be most beneficial to the patient. I mentioned repeating a colonoscopy as Mr. Peral requested, but Dr. Ardell Isaacs comments did not include repeating a colonoscopy, yet. A specialist at one of those centers may have more input about further work-up. If the patient is interested in being referred to one of those centers (Dr. Fara Boros previously considered referring him to the [facility] for Functional GI and Motility Disorders), I would be happy to place that referral for him. Please let the patient know of these updates, and reiterate to him that I can see him in the office at any time, if he would like. Thanks. --CMS

## 2013-08-09 NOTE — Telephone Encounter (Signed)
Per Dr. Casper Harrison, If patient calls back today, please tell him that Dr. Casper Harrison will call him back tomorrow.

## 2013-08-10 ENCOUNTER — Telehealth: Payer: Self-pay | Admitting: Family Medicine

## 2013-08-10 DIAGNOSIS — R109 Unspecified abdominal pain: Secondary | ICD-10-CM

## 2013-08-10 DIAGNOSIS — Z86010 Personal history of colonic polyps: Secondary | ICD-10-CM

## 2013-08-10 NOTE — Telephone Encounter (Signed)
Left second message on pt's answering machine reiterating previous messages. Will plan to see pt if/when he schedules follow up with me to discuss referral in person if he prefers. If he calls and requests that the referral be placed, I will be happy to do so without necessarily seeing him for an office visit. Thanks. --CMS

## 2013-08-10 NOTE — Telephone Encounter (Signed)
Called pt to discuss GI referral (see recent previous telephone notes). Pt expressed some frustration but is open to being seen at [facility]. He is however concerned about having transportation, and he expresses frustration that "they'll have to see me a bunch of times" and states, "why can't they just do what they need to do when I'm there?" I explained to pt that he will need to be evaluated first before any procedure work-up can be done (colonoscopy, etc), IF specialists at [facility] think that such intervention would be helpful, and that we will need to take things one at a time.   Will place referral requesting pt to be seen by GI specialist at [facility] (tertiary center) per Dr. Ardell Isaacs recommendation, and will also copy this message to Theresia Bough, CSW to see if we can assist with transportation (appreciate any help, as always!!!). Pt expressed thanks for placing referral and for any other help we can provide. --CMS

## 2013-08-11 ENCOUNTER — Telehealth: Payer: Self-pay | Admitting: *Deleted

## 2013-08-11 NOTE — Telephone Encounter (Signed)
Clinical Child psychotherapist (CSW) received a referral to assist pt with transportation concerns. CSW observed that pt has Medicaid and should be able to use Medicaid transportation for appts. CSW left a message for pt.  Theresia Bough, MSW, LCSW [phone]

## 2013-08-11 NOTE — Telephone Encounter (Signed)
Called pt. LMVM to call back. Waiting for call back. (I sent a referral to [facility]. They will get in contact with him to schedule appt) .Kenneth Roberts

## 2013-08-12 ENCOUNTER — Ambulatory Visit: Payer: PRIVATE HEALTH INSURANCE | Admitting: Family Medicine

## 2013-08-16 NOTE — Telephone Encounter (Signed)
Pt never called back. .Kenneth Roberts  

## 2013-08-29 ENCOUNTER — Ambulatory Visit (INDEPENDENT_AMBULATORY_CARE_PROVIDER_SITE_OTHER): Payer: Medicare Other | Admitting: Podiatry

## 2013-08-29 ENCOUNTER — Encounter: Payer: Self-pay | Admitting: Podiatry

## 2013-08-29 VITALS — BP 164/97 | HR 82 | Resp 12

## 2013-08-29 DIAGNOSIS — Q828 Other specified congenital malformations of skin: Secondary | ICD-10-CM

## 2013-08-29 DIAGNOSIS — B351 Tinea unguium: Secondary | ICD-10-CM

## 2013-08-29 DIAGNOSIS — M79609 Pain in unspecified limb: Secondary | ICD-10-CM

## 2013-08-29 NOTE — Progress Notes (Signed)
Subjective:     Patient ID: Kenneth Roberts, male   DOB: 21-Aug-1941, 72 y.o.   MRN: [id number]  HPI patient states my nails are thick and I cannot cut them and they're painful and I have these corns on the bottom my feet that are painful   Review of Systems     Objective:   Physical Exam Neurological vascular intact bilateral. Thick nail disease with pain 1-5 bilateral. 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 Porokeratotic lesions with pain plantar aspect bilateral    Plan:     Debridement of nailbeds 1-5 bilateral with no iatrogenic bleeding. Debridement of lesions bilateral with no iatrogenic bleeding noted

## 2013-09-16 ENCOUNTER — Ambulatory Visit: Payer: PRIVATE HEALTH INSURANCE | Admitting: Family Medicine

## 2013-09-28 ENCOUNTER — Ambulatory Visit: Payer: PRIVATE HEALTH INSURANCE | Admitting: Family Medicine

## 2013-10-05 ENCOUNTER — Ambulatory Visit: Payer: PRIVATE HEALTH INSURANCE | Admitting: Family Medicine

## 2013-10-18 ENCOUNTER — Telehealth: Payer: Self-pay | Admitting: Family Medicine

## 2013-10-18 NOTE — Telephone Encounter (Signed)
NPI # number given. Was referred by Dr Casper Harrison

## 2013-11-24 ENCOUNTER — Encounter: Payer: Self-pay | Admitting: Family Medicine

## 2013-11-24 ENCOUNTER — Ambulatory Visit (INDEPENDENT_AMBULATORY_CARE_PROVIDER_SITE_OTHER): Payer: PRIVATE HEALTH INSURANCE | Admitting: Family Medicine

## 2013-11-24 ENCOUNTER — Telehealth: Payer: Self-pay | Admitting: Family Medicine

## 2013-11-24 VITALS — BP 140/90 | HR 81 | Ht 70.0 in | Wt 180.0 lb

## 2013-11-24 DIAGNOSIS — R109 Unspecified abdominal pain: Secondary | ICD-10-CM

## 2013-11-24 DIAGNOSIS — I10 Essential (primary) hypertension: Secondary | ICD-10-CM

## 2013-11-24 MED ORDER — MIRABEGRON ER 50 MG PO TB24: 50.0000 mg | ORAL_TABLET | Freq: Every day | ORAL | Status: DC

## 2013-11-24 MED ORDER — AMLODIPINE BESYLATE 5 MG PO TABS
5.0000 mg | ORAL_TABLET | Freq: Every day | ORAL | Status: DC
Start: 2013-11-24 — End: 2016-12-25

## 2013-11-24 NOTE — Progress Notes (Signed)
   Subjective:    Patient ID: Kenneth Roberts, male    DOB: [birth year], 73 y.o.   MRN: [id number]  HPI: Pt presents to clinic with complaint of chronic abdominal pain. Pt recently seen at [place] (Dr. Dara Lords, apparently a fellow there) and pt states he "didn't do anything or tell him anything," but pt was ordered for an MRI. Pt reports he was told by "somebody there" (?at whatever center he was scheduled at) that he could get it done at "[address] in [place], but he was told at "that place" that "they couldn't do it there." Pt overall cannot give specifics of who told him what or where things were ordered and is overall vague about what has been done. He is, however, wanting to get the MRI "to get some answers." He also states that he brought up a repeat colonoscopy at Dr. Dara Lords' office but "nobody listened."  Abdominal pain is again vaguely described, but pt states he doesn't have any right now. Pain is crampy, "bad, all the time," "in the middle" of his abdomen. He cannot related it to food / activity or any other definite factors. He reports compliance with Myrbetriq and does request refill of this and incidentally of amlodipine for HTN.  Review of Systems: As above. Denies current bad abdominal pain, blood in stool / change in stool, change in urinary habits, N/V/D, chest pain, SOB, coryza-type symptoms.     Objective:   Physical Exam BP 140/90  Pulse 81  Ht 5\' 10"  (1.778 m)  Wt 180 lb (81.647 kg)  BMI 25.83 kg/m2 Gen: well-appearing adult male, frustrated but in NAD HEENT: [place]/AT, MMM, EOMI/PERRLA Cardio: RRR, no murmur Pulm: CTAB, no wheezes Abd: soft, nontender, no masses appreciated, BS+ Ext: warm, well-perfused, no LE edema     Assessment & Plan:

## 2013-11-24 NOTE — Assessment & Plan Note (Signed)
A: Has been seen at [facility] by a fellow there, Dr. Dara Lords. Has been ordered for an MRI but not yet had it done; MRI has been approved per his insurance, but was ordered by a different provider (Dr. Dara Lords) and pt initially thought I could help with this order / scheduling. Overall is very frustrated by "the runaround," but pt is very difficult to understand (speaks very fast, mumbles) and is very easily frustrated and quickly gets dismissive of providers' advice / plans, repeatedly stating "nobody's been listening." See previous progress notes by other providers through our office -- pt has been difficult to encourage to cooperate with referral plans, in the past.  P: Continue Myrbetriq and follow-up with [facility] as planned. Counseled extensively that I cannot make other providers order any specific test (such as repeat colonoscopy) and that pt needs to contact Dr. Dara Lords' office to clarify where, when, and at what time to get his MRI, and when to f/u with those results to see what else needs to be done. Will plan to f/u with pt here after all this is done. Pt is agreeable to this plan at least at this time. Pt called after visit stating the number he had to Dr. Dara Lords' office was wrong (see separate telephone note); I left [facility]'s main office number on his voice mail.

## 2013-11-24 NOTE — Assessment & Plan Note (Signed)
Not specifically addressed this visit. Incidentally refilled Norvasc today. F/u at next visit.

## 2013-11-24 NOTE — Telephone Encounter (Signed)
Pt saw MD today, will fwd to MD for clarification.  Kenneth Roberts, Kenneth Roberts, Kenneth Roberts

## 2013-11-24 NOTE — Telephone Encounter (Signed)
Called pt back. No answer. Left message for pt to contact [facility] at [phone] and to call back here if that number is not correct. Dr. Dara Lords appears to be a GI fellow at their GI Dept, [place]. Will f/u as needed. Please see also my pending office note from today's visit. --CMS

## 2013-11-24 NOTE — Telephone Encounter (Signed)
When he called Dr Dara Lords, he said the person told him he was dead and he had the wrong number Please call him with the correct number for Dr Dara Lords

## 2013-11-24 NOTE — Patient Instructions (Signed)
Thank you for coming in, today!  Call Dr. Dara Lords' office to find out where and when to get your MRI. Also ask when to come back to see him after you get it. In the meantime, keep taking your same medicines. I refilled them today. Come back to see me after you see Dr. Dara Lords again. Fill out a form today so we can get records from Dr. Dara Lords.  Please feel free to call with any questions or concerns at any time, at [phone]. --Dr. Venetia Maxon

## 2013-12-01 ENCOUNTER — Ambulatory Visit: Payer: Medicare Other | Admitting: Podiatry

## 2013-12-09 ENCOUNTER — Ambulatory Visit: Payer: Medicaid Other | Admitting: Podiatrist

## 2013-12-09 ENCOUNTER — Telehealth: Payer: Self-pay | Admitting: Family Medicine

## 2013-12-09 ENCOUNTER — Telehealth: Payer: Self-pay | Admitting: *Deleted

## 2013-12-09 NOTE — Telephone Encounter (Addendum)
Pt states has an appt in our office on Wednesday, and wants to know the test results from Dr Venetia Maxon.  I left a message informing him we do have him scheduled on Wednesday 12/14/2013, but I did not feel we could discuss the testing results ordered by another doctor.  I requested a call back.  Pt states he made a mistake and will be here on Wednesday.

## 2013-12-09 NOTE — Telephone Encounter (Signed)
Patient would like to know if Dr. Venetia Maxon has received results of MRI. If so, please call patient w/ results.

## 2013-12-09 NOTE — Telephone Encounter (Signed)
Will fwd to MD.  Kaaliyah Kita L, CMA  

## 2013-12-09 NOTE — Telephone Encounter (Signed)
I have not received any results from pt's MRI. If he has not specifically requested that the results be sent to me from his GI doctor at [facility] in [place] (and / or if he hasn't filled out a release form for them), they may not automatically forward the results to me. Please call the patient and instruct him to 1) call his GI doctor in [place] to get his results and 2) ask them to forward their notes and any test results to me. He should follow up with them to see what, if anything, needs to be done based on what the MRI showed. Thanks. --CMS

## 2013-12-12 NOTE — Telephone Encounter (Signed)
Called pt.LMVM to call back. Please see message. Thanks. .Kenneth Roberts  

## 2013-12-13 NOTE — Telephone Encounter (Signed)
Attempted to reach pt.  LMVM for pt to please return call.  Jilliane Kazanjian, Loralyn Freshwater, [place]

## 2013-12-14 ENCOUNTER — Encounter: Payer: Self-pay | Admitting: Podiatrist

## 2013-12-14 ENCOUNTER — Ambulatory Visit (INDEPENDENT_AMBULATORY_CARE_PROVIDER_SITE_OTHER): Payer: Medicare Other | Admitting: Podiatrist

## 2013-12-14 VITALS — BP 143/79 | HR 67 | Resp 16

## 2013-12-14 DIAGNOSIS — Q828 Other specified congenital malformations of skin: Secondary | ICD-10-CM

## 2013-12-14 DIAGNOSIS — B351 Tinea unguium: Secondary | ICD-10-CM

## 2013-12-14 DIAGNOSIS — M216X9 Other acquired deformities of unspecified foot: Secondary | ICD-10-CM

## 2013-12-14 DIAGNOSIS — M79609 Pain in unspecified limb: Secondary | ICD-10-CM

## 2013-12-15 ENCOUNTER — Telehealth: Payer: Self-pay | Admitting: Family Medicine

## 2013-12-15 MED ORDER — TERAZOSIN HCL 10 MG PO CAPS: 10.0000 mg | ORAL_CAPSULE | Freq: Every day | ORAL | Status: DC

## 2013-12-15 NOTE — Telephone Encounter (Signed)
Pt has been diagnosed with BPH in the past and has been on terazosin but not in a while. I am comfortable prescribing this medication with one refill, and it may help with his difficult to manage abdominal pain. He is due to follow up with me once he straightens out his issues with MRI and GI follow up in [place], and I will discuss this further with him at that time. --CMS

## 2013-12-15 NOTE — Telephone Encounter (Signed)
Needs refills on med for his prostrate. Doesn't know the name of med

## 2013-12-15 NOTE — Telephone Encounter (Signed)
Pt will call [facility] and will sign a ROI next OV for [place] to request MRI results.  Shane Badeaux, Loralyn Freshwater, [place]

## 2013-12-15 NOTE — Progress Notes (Signed)
HPI: Patient presents today for follow up of foot and nail care. Past medical history, meds, and allergies reviewed.  Objective: Neurovascular status unchanged with palpable pedal pulses and neurological sensation intact. Patients nails are elongated, thickened, discolored, dystrophic with ingrown deformity present. + hyperkeratotic and pre-ulcerative lesions present submet 2, 3 bilateral. Porokeratotic appearance with intact integment post debridement noted  Assessment: symptomatic mycotic nails, prominent metatarsal bilateral, hyperkeratotic lesion x 4  Plan: Discussed treatment options and alternatives. Debrided nails without complication. Debrided hyperkeratotic lesions without complication. Return appointment recommended at routine intervals of 3 months.     

## 2013-12-22 ENCOUNTER — Telehealth: Payer: Self-pay | Admitting: Family Medicine

## 2013-12-22 NOTE — Telephone Encounter (Signed)
Pt called and is very concerned about the medication he is taking for his prostate. The medication makes him go to the bathroom on himself without warning. He can not leave the house because of this. He would like Dr. Venetia Maxon to call him. Blima Rich

## 2013-12-22 NOTE — Telephone Encounter (Signed)
Reviewed and discussed with K. Viola, [place]. Pt can stop Hytrin and wait to see if that helps by itself, and/or come in to clinic tomorrow or as needed per his preference. If he does come in to clinic tomorrow, it will have to be with another provider (I am not back in clinic until next week). --CMS

## 2013-12-22 NOTE — Telephone Encounter (Signed)
Pt states he has increased urination x 1 week, he is also complaining of dizziness since starting the Hytrin 10 mg.  I instructed patient he needs to be seen tomorrow in the office.  Pt didn't want to come into the office.  I told pt. He needs OV to make sure he doesn't have an infection.  Pt will call back for appt for tomorrow.  Ashleyann Shoun, Loralyn Freshwater, [place]

## 2014-01-03 ENCOUNTER — Emergency Department (HOSPITAL_COMMUNITY)
Admission: EM | Admit: 2014-01-03 | Discharge: 2014-01-03 | Disposition: A | Discharge status: Home / Self Care | Payer: PRIVATE HEALTH INSURANCE | Attending: Emergency Medicine | Admitting: Emergency Medicine

## 2014-01-03 ENCOUNTER — Encounter (HOSPITAL_COMMUNITY): Payer: Self-pay | Admitting: Emergency Medicine

## 2014-01-03 ENCOUNTER — Emergency Department (HOSPITAL_COMMUNITY): Payer: PRIVATE HEALTH INSURANCE

## 2014-01-03 DIAGNOSIS — N419 Inflammatory disease of prostate, unspecified: Secondary | ICD-10-CM | POA: Insufficient documentation

## 2014-01-03 DIAGNOSIS — R109 Unspecified abdominal pain: Secondary | ICD-10-CM | POA: Insufficient documentation

## 2014-01-03 DIAGNOSIS — N50819 Testicular pain, unspecified: Secondary | ICD-10-CM

## 2014-01-03 DIAGNOSIS — K219 Gastro-esophageal reflux disease without esophagitis: Secondary | ICD-10-CM | POA: Insufficient documentation

## 2014-01-03 DIAGNOSIS — Z860100 Personal history of colon polyps, unspecified: Secondary | ICD-10-CM | POA: Insufficient documentation

## 2014-01-03 DIAGNOSIS — Z79899 Other long term (current) drug therapy: Secondary | ICD-10-CM | POA: Insufficient documentation

## 2014-01-03 DIAGNOSIS — I10 Essential (primary) hypertension: Secondary | ICD-10-CM | POA: Insufficient documentation

## 2014-01-03 DIAGNOSIS — N509 Disorder of male genital organs, unspecified: Secondary | ICD-10-CM | POA: Insufficient documentation

## 2014-01-03 DIAGNOSIS — Z8669 Personal history of other diseases of the nervous system and sense organs: Secondary | ICD-10-CM | POA: Insufficient documentation

## 2014-01-03 DIAGNOSIS — R350 Frequency of micturition: Secondary | ICD-10-CM | POA: Insufficient documentation

## 2014-01-03 DIAGNOSIS — Z87891 Personal history of nicotine dependence: Secondary | ICD-10-CM | POA: Insufficient documentation

## 2014-01-03 DIAGNOSIS — Z862 Personal history of diseases of the blood and blood-forming organs and certain disorders involving the immune mechanism: Secondary | ICD-10-CM | POA: Insufficient documentation

## 2014-01-03 DIAGNOSIS — R51 Headache: Secondary | ICD-10-CM | POA: Insufficient documentation

## 2014-01-03 DIAGNOSIS — R3915 Urgency of urination: Secondary | ICD-10-CM | POA: Insufficient documentation

## 2014-01-03 LAB — COMPREHENSIVE METABOLIC PANEL
ALBUMIN: 4 g/dL (ref 3.5–5.2)
ALK PHOS: 22 U/L — AB (ref 39–117)
ALT: 10 U/L (ref 0–53)
AST: 15 U/L (ref 0–37)
BUN: 12 mg/dL (ref 6–23)
CO2: 27 mEq/L (ref 19–32)
Calcium: 9.7 mg/dL (ref 8.4–10.5)
Chloride: 104 mEq/L (ref 96–112)
Creatinine, Ser: 0.96 mg/dL (ref 0.50–1.35)
GFR calc Af Amer: >90 mL/min (ref >90)
GFR calc non Af Amer: 81 mL/min — ABNORMAL LOW (ref >90)
Glucose, Bld: 104 mg/dL — ABNORMAL HIGH (ref 70–99)
POTASSIUM: 4.2 meq/L (ref 3.7–5.3)
Sodium: 142 mEq/L (ref 137–147)
TOTAL PROTEIN: 7.3 g/dL (ref 6.0–8.3)
Total Bilirubin: 0.6 mg/dL (ref 0.3–1.2)

## 2014-01-03 LAB — URINALYSIS, ROUTINE W REFLEX MICROSCOPIC
BILIRUBIN URINE: NEGATIVE
Glucose, UA: NEGATIVE mg/dL
Hgb urine dipstick: NEGATIVE
Ketones, ur: NEGATIVE mg/dL
LEUKOCYTES UA: NEGATIVE
NITRITE: NEGATIVE
Protein, ur: NEGATIVE mg/dL
SPECIFIC GRAVITY, URINE: 1.015 (ref 1.005–1.030)
UROBILINOGEN UA: 0.2 mg/dL (ref 0.0–1.0)
pH: 8.5 — ABNORMAL HIGH (ref 5.0–8.0)

## 2014-01-03 LAB — CBC WITH DIFFERENTIAL/PLATELET
Basophils Absolute: 0 10*3/uL (ref 0.0–0.1)
Basophils Relative: 1 % (ref 0–1)
EOS PCT: 1 % (ref 0–5)
Eosinophils Absolute: 0 10*3/uL (ref 0.0–0.7)
HEMATOCRIT: 37.9 % — AB (ref 39.0–52.0)
Hemoglobin: 13.4 g/dL (ref 13.0–17.0)
LYMPHS ABS: 1 10*3/uL (ref 0.7–4.0)
LYMPHS PCT: 29 % (ref 12–46)
MCH: 31.2 pg (ref 26.0–34.0)
MCHC: 35.4 g/dL (ref 30.0–36.0)
MCV: 88.1 fL (ref 78.0–100.0)
MONO ABS: 0.3 10*3/uL (ref 0.1–1.0)
Monocytes Relative: 8 % (ref 3–12)
Neutro Abs: 2.1 10*3/uL (ref 1.7–7.7)
Neutrophils Relative %: 62 % (ref 43–77)
Platelets: 212 10*3/uL (ref 150–400)
RBC: 4.3 MIL/uL (ref 4.22–5.81)
RDW: 14.2 % (ref 11.5–15.5)
WBC: 3.4 10*3/uL — AB (ref 4.0–10.5)

## 2014-01-03 MED ORDER — CEFTRIAXONE SODIUM 250 MG IJ SOLR
250.0000 mg | Freq: Once | INTRAMUSCULAR | Status: AC
  Administered 2014-01-03: 250 mg via INTRAMUSCULAR
  Filled 2014-01-03: qty 250

## 2014-01-03 MED ORDER — KETOROLAC TROMETHAMINE 30 MG/ML IJ SOLN
30.0000 mg | Freq: Once | INTRAMUSCULAR | Status: AC
  Administered 2014-01-03: 30 mg via INTRAVENOUS
  Filled 2014-01-03: qty 1

## 2014-01-03 MED ORDER — DOXYCYCLINE HYCLATE 100 MG PO TABS
100.0000 mg | ORAL_TABLET | Freq: Once | ORAL | Status: AC
  Administered 2014-01-03: 100 mg via ORAL
  Filled 2014-01-03: qty 1

## 2014-01-03 MED ORDER — DOXYCYCLINE HYCLATE 100 MG PO CAPS: 100.0000 mg | ORAL_CAPSULE | Freq: Two times a day (BID) | ORAL | Status: DC

## 2014-01-03 MED ORDER — SODIUM CHLORIDE 0.9 % IV BOLUS (SEPSIS)
500.0000 mL | Freq: Once | INTRAVENOUS | Status: AC
  Administered 2014-01-03: 500 mL via INTRAVENOUS

## 2014-01-03 NOTE — ED Notes (Signed)
Pt in a hurry to leave. States he has to catch the bus before it stops running.

## 2014-01-03 NOTE — Discharge Instructions (Signed)
Prostatitis The prostate gland is about the size and shape of a walnut. It is located just below your bladder. It produces one of the components of semen, which is made up of sperm and the fluids that help nourish and transport it out from the testicles. Prostatitis is inflammation of the prostate gland.  There are four types of prostatitis:  Acute bacterial prostatitis This is the least common type of prostatitis. It starts quickly and usually is associated with a bladder infection, high fever, and shaking chills. It can occur at any age.  Chronic bacterial prostatitis This is a persistent bacterial infection in the prostate. It usually develops from repeated acute bacterial prostatitis or acute bacterial prostatitis that was not properly treated. It can occur in men of any age but is most common in middle-aged men whose prostate has begun to enlarge. The symptoms are not as severe as those in acute bacterial prostatitis. Discomfort in the part of your body that is in front of your rectum and below your scrotum (perineum), lower abdomen, or in the head of your penis (glans) may represent your primary discomfort.  Chronic prostatitis (nonbacterial) This is the most common type of prostatitis. It is inflammation of the prostate gland that is not caused by a bacterial infection. The cause is unknown and may be associated with a viral infection or autoimmune disorder.  Prostatodynia (pelvic floor disorder) This is associated with increased muscular tone in the pelvis surrounding the prostate. CAUSES The causes of bacterial prostatitis are bacterial infection. The causes of the other types of prostatitis are unknown.  SYMPTOMS  Symptoms can vary depending upon the type of prostatitis that exists. There can also be overlap in symptoms. Possible symptoms for each type of prostatitis are listed below. Acute Bacterial Prostatitis  Painful urination.  Fever or chills.  Muscle or joint pains.  Low  back pain.  Low abdominal pain.  Inability to empty bladder completely. Chronic Bacterial Prostatitis, Chronic Nonbacterial Prostatitis, and Prostatodynia  Sudden urge to urinate.  Frequent urination.  Difficulty starting urine stream.  Weak urine stream.  Discharge from the urethra.  Dribbling after urination.  Rectal pain.  Pain in the testicles, penis, or tip of the penis.  Pain in the perineum.  Problems with sexual function.  Painful ejaculation.  Bloody semen. DIAGNOSIS  In order to diagnose prostatitis, your health care provider will ask about your symptoms. One or more urine samples will be taken and tested (urinalysis). If the urinalysis result is negative for bacteria, your health care provider may use a finger to feel your prostate (digital rectal exam). This exam helps your health care provider determine if your prostate is swollen and tender. It will also produce a specimen of semen that can be analyzed. TREATMENT  Treatment for prostatitis depends on the cause. If a bacterial infection is the cause, it can be treated with antibiotic medicine. In cases of chronic bacterial prostatitis, the use of antibiotics for up to 1 month or 6 weeks may be necessary. Your health care provider may instruct you to take sitz baths to help relieve pain. A sitz bath is a bath of hot water in which your hips and buttocks are under water. This relaxes the pelvic floor muscles and often helps to relieve the pressure on your prostate. HOME CARE INSTRUCTIONS   Take all medicines as directed by your health care provider.  Take sitz baths as directed by your health care provider. SEEK MEDICAL CARE IF:   Your symptoms   get worse, not better.  You have a fever. SEEK IMMEDIATE MEDICAL CARE IF:   You have chills.  You feel nauseous or vomit.  You feel lightheaded or faint.  You are unable to urinate.  You have blood or blood clots in your urine. Document Released: 10/17/2000  Document Revised: 08/10/2013 Document Reviewed: 05/09/2013 Exit[employer] Patient Information 2014 [employer].  

## 2014-01-03 NOTE — ED Notes (Signed)
Patient transported to Ultrasound 

## 2014-01-03 NOTE — ED Notes (Signed)
MD at bedside. 

## 2014-01-03 NOTE — ED Notes (Addendum)
states cannot stop voiding x 1 week   but does have abd pain x 9 days no n/v/d  Denies pain when he voids sates BMs have been noraml had one today states started on new med for his prostate 1 week ago

## 2014-01-03 NOTE — ED Provider Notes (Signed)
TIME SEEN: 3:34 PM  CHIEF COMPLAINT: Polyuria, urinary urgency and frequency, testicular pain, headache  HPI:  patient is a 73 y.o. M with history of hypertension, leukopenia, BPH he presents emergency department with one week of complaints of polyuria, urinary frequency and urgency and. He also states he has had several months of bilateral testicular pain. He is also had one week of intermittent posterior, gradual onset headache that radiates into the left side of his head. He has not tried taking any medication at home for his headache. He denies that he is having any fevers, chills, abdominal pain, nausea, vomiting or diarrhea. No dysuria or hematuria. He denies any penile discharge or scrotal masses. He is sexually active. He denies a sudden onset, thunderclap headache. No new back pain, numbness or weakness in his extremities, urinary retention, bowel incontinence.  ROS: See HPI Constitutional: no fever  Eyes: no drainage  ENT: no runny nose   Cardiovascular:  no chest pain  Resp: no SOB  GI: no vomiting GU: no dysuria Integumentary: no rash  Allergy: no hives  Musculoskeletal: no leg swelling  Neurological: no slurred speech ROS otherwise negative  PAST MEDICAL HISTORY/PAST SURGICAL HISTORY:  Past Medical History  Diagnosis Date  . Hypertension   . Cataract   . GERD (gastroesophageal reflux disease)   . Leukopenia   . Hiatal hernia   . BPH (benign prostatic hypertrophy)   . Barrett's esophagus without dysplasia 08/2006  . Internal hemorrhoids   . Adenomatous colon polyp 01/2009  . Fatty liver   . Hepatic hemangioma   . Anemia     MEDICATIONS:  Prior to Admission medications   Medication Sig Start Date End Date Taking? Authorizing Provider  amLODipine (NORVASC) 5 MG tablet Take 1 tablet (5 mg total) by mouth daily. 11/24/13   [place], MD  mirabegron ER (MYRBETRIQ) 50 MG TB24 tablet Take 1 tablet (50 mg total) by mouth daily. 11/24/13   [place], MD   OVER THE COUNTER MEDICATION Take 2 tablets by mouth daily as needed (acid reflex).    Historical Provider, MD  terazosin (HYTRIN) 10 MG capsule Take 1 capsule (10 mg total) by mouth daily. 12/15/13   [place], MD    ALLERGIES:  Allergies  Allergen Reactions  . Prilosec [Omeprazole] Itching    SOCIAL HISTORY:  History  Substance Use Topics  . Smoking status: Former Smoker    Quit date: 02/16/1981  . Smokeless tobacco: Never Used  . Alcohol Use: 7.5 oz/week    15 drink(s) per week    FAMILY HISTORY: Family History  Problem Relation Age of Onset  . Diabetes Father   . Diabetes Brother   . Diabetes Sister     EXAM: BP 103/77  Pulse 78  Temp(Src) 97.8 F (36.6 C)  Resp 19  SpO2 100% CONSTITUTIONAL: Alert and oriented and responds appropriately to questions. Well-appearing; well-nourished HEAD: Normocephalic EYES: Conjunctivae clear, PERRL ENT: normal nose; no rhinorrhea; moist mucous membranes; pharynx without lesions noted NECK: Supple, no meningismus, no LAD  CARD: RRR; S1 and S2 appreciated; no murmurs, no clicks, no rubs, no gallops RESP: Normal chest excursion without splinting or tachypnea; breath sounds clear and equal bilaterally; no wheezes, no rhonchi, no rales,  ABD/GI: Normal bowel sounds; non-distended; soft, non-tender, no rebound, no guarding GU:  Normal external genitalia, no penile discharge, bilateral testicles are extremely tender to palpation with no testicular mass or scrotal swelling or hernia, 2+ femoral pulses bilaterally, no high  riding testicle, no induration or erythema or warmth of the perineum  RECTAL:  Patient has a enlarged prostate which is tender to palpation, normal rectal tone, no gross blood or melena  BACK:  The back appears normal and is non-tender to palpation, there is no CVA tenderness; no midline spinal tenderness or step-off or deformity  EXT: Normal ROM in all joints; non-tender to palpation; no edema; normal capillary  refill; no cyanosis    SKIN: Normal color for age and race; warm NEURO: Moves all extremities equally; strength 5/5 in all 4 extremities in all muscle groups, sensation to light touch intact diffusely, no saddle anesthesia, 2+ deep tendon reflexes in bilateral upper and lower extremities, no clonus, normal gait  PSYCH: The patient's mood and manner are appropriate. Grooming and personal hygiene are appropriate.  MEDICAL DECISION MAKING:  Patient here with multiple complaints. He is complaining of urinary frequency, urgency for one week. He has no new neurologic deficits on exam and has no back pain. His glucose is normal at 104. His urine shows no sign of infection. He is also complaining of headache. I am not concerned for intracranial hemorrhage, infarct. I do not feel he needs head imaging at this time. Patient also complaining of bilateral testicular pain. We'll obtain testicular ultrasound. He is sexual active. He also has a very tender prostate. Suspect possible prostatitis versus epididymitis. Patient states he is not concerned for sexually transmitted diseases.   ED PROGRESS: Patient's ultrasound shows no convincing evidence of epididymitis or orchitis. There is normal flow to both testicles. He has a small right hydrocele. Will treat for prostatitis. Patient reports feeling much better his rate for discharge home. Have given return precautions. Patient verbalizes understanding and is comfortable plan.     [place], DO 01/03/14 1902

## 2014-01-25 ENCOUNTER — Telehealth: Payer: Self-pay | Admitting: *Deleted

## 2014-01-25 NOTE — Telephone Encounter (Signed)
LM for patient to call back.  Please ask him if he has received a flu shot this year and if not is he interested.  Thanks [employer]

## 2014-01-27 ENCOUNTER — Encounter (HOSPITAL_COMMUNITY): Payer: Self-pay | Admitting: Emergency Medicine

## 2014-01-27 ENCOUNTER — Emergency Department (HOSPITAL_COMMUNITY)
Admission: EM | Admit: 2014-01-27 | Discharge: 2014-01-27 | Disposition: A | Discharge status: Home / Self Care | Payer: PRIVATE HEALTH INSURANCE | Attending: Emergency Medicine | Admitting: Emergency Medicine

## 2014-01-27 ENCOUNTER — Telehealth: Payer: Self-pay | Admitting: Family Medicine

## 2014-01-27 DIAGNOSIS — I10 Essential (primary) hypertension: Secondary | ICD-10-CM | POA: Insufficient documentation

## 2014-01-27 DIAGNOSIS — N400 Enlarged prostate without lower urinary tract symptoms: Secondary | ICD-10-CM | POA: Insufficient documentation

## 2014-01-27 DIAGNOSIS — Z8669 Personal history of other diseases of the nervous system and sense organs: Secondary | ICD-10-CM | POA: Insufficient documentation

## 2014-01-27 DIAGNOSIS — Z79899 Other long term (current) drug therapy: Secondary | ICD-10-CM | POA: Insufficient documentation

## 2014-01-27 DIAGNOSIS — Z860100 Personal history of colon polyps, unspecified: Secondary | ICD-10-CM | POA: Insufficient documentation

## 2014-01-27 DIAGNOSIS — Z862 Personal history of diseases of the blood and blood-forming organs and certain disorders involving the immune mechanism: Secondary | ICD-10-CM | POA: Insufficient documentation

## 2014-01-27 DIAGNOSIS — Z8719 Personal history of other diseases of the digestive system: Secondary | ICD-10-CM | POA: Insufficient documentation

## 2014-01-27 DIAGNOSIS — Z87891 Personal history of nicotine dependence: Secondary | ICD-10-CM | POA: Insufficient documentation

## 2014-01-27 DIAGNOSIS — R109 Unspecified abdominal pain: Secondary | ICD-10-CM

## 2014-01-27 LAB — CBC WITH DIFFERENTIAL/PLATELET
Basophils Absolute: 0 10*3/uL (ref 0.0–0.1)
Basophils Relative: 0 % (ref 0–1)
Eosinophils Absolute: 0 10*3/uL (ref 0.0–0.7)
Eosinophils Relative: 0 % (ref 0–5)
HCT: 37.7 % — ABNORMAL LOW (ref 39.0–52.0)
Hemoglobin: 13.4 g/dL (ref 13.0–17.0)
Lymphocytes Relative: 28 % (ref 12–46)
Lymphs Abs: 0.8 10*3/uL (ref 0.7–4.0)
MCH: 31.1 pg (ref 26.0–34.0)
MCHC: 35.5 g/dL (ref 30.0–36.0)
MCV: 87.5 fL (ref 78.0–100.0)
Monocytes Absolute: 0.3 10*3/uL (ref 0.1–1.0)
Monocytes Relative: 11 % (ref 3–12)
Neutro Abs: 1.7 10*3/uL (ref 1.7–7.7)
Neutrophils Relative %: 61 % (ref 43–77)
Platelets: 188 10*3/uL (ref 150–400)
RBC: 4.31 MIL/uL (ref 4.22–5.81)
RDW: 14.3 % (ref 11.5–15.5)
WBC: 2.8 10*3/uL — ABNORMAL LOW (ref 4.0–10.5)

## 2014-01-27 LAB — URINALYSIS, ROUTINE W REFLEX MICROSCOPIC
Bilirubin Urine: NEGATIVE
Glucose, UA: NEGATIVE mg/dL
Hgb urine dipstick: NEGATIVE
Ketones, ur: NEGATIVE mg/dL
Leukocytes, UA: NEGATIVE
Nitrite: NEGATIVE
Protein, ur: NEGATIVE mg/dL
Specific Gravity, Urine: 1.012 (ref 1.005–1.030)
Urobilinogen, UA: 0.2 mg/dL (ref 0.0–1.0)
pH: 8 (ref 5.0–8.0)

## 2014-01-27 LAB — BASIC METABOLIC PANEL
BUN: 12 mg/dL (ref 6–23)
CO2: 26 mEq/L (ref 19–32)
Calcium: 9.7 mg/dL (ref 8.4–10.5)
Chloride: 103 mEq/L (ref 96–112)
Creatinine, Ser: 0.97 mg/dL (ref 0.50–1.35)
GFR calc Af Amer: >90 mL/min (ref >90)
GFR calc non Af Amer: 81 mL/min — ABNORMAL LOW (ref >90)
Glucose, Bld: 95 mg/dL (ref 70–99)
Potassium: 4.1 mEq/L (ref 3.7–5.3)
Sodium: 141 mEq/L (ref 137–147)

## 2014-01-27 MED ORDER — ONDANSETRON HCL 4 MG/2ML IJ SOLN
4.0000 mg | Freq: Once | INTRAMUSCULAR | Status: AC
  Administered 2014-01-27: 4 mg via INTRAVENOUS
  Filled 2014-01-27: qty 2

## 2014-01-27 MED ORDER — SODIUM CHLORIDE 0.9 % IV BOLUS (SEPSIS)
1000.0000 mL | Freq: Once | INTRAVENOUS | Status: AC
  Administered 2014-01-27: 1000 mL via INTRAVENOUS

## 2014-01-27 MED ORDER — DICYCLOMINE HCL 20 MG PO TABS: 20.0000 mg | ORAL_TABLET | Freq: Four times a day (QID) | ORAL | Status: DC | PRN

## 2014-01-27 NOTE — Telephone Encounter (Signed)
Please see my last office visit note from January and several phone calls between now and then; pt has already been referred to GI (at [facility] at the recommendation of another GI here in [place], Dr. Fuller Plan, who has also seen pt; abdominal CT in Feb 2014 showed small hiatal hernia, 2.2 cm stable liver hemangioma, ?mild pancreatic edema and colonoscopy in 2012 showed some polyps, one with tubular adenoma, but overall was unremarkable otherwise). Pt has at least had an MRI ordered by Dr. Dara Lords at [facility], but there was confusion about where / when this was to be done. Pt did call requesting results from me, at one point, so I assume he actually had the MRI, but I'm not sure if / when he has followed up with [facility], since that time or since his last visit with me.  In the past I've instructed pt to contact his [place] doctors' office to straighten things out / answer his questions, but I'm not sure what he's actually done, and I have received no information from any providers at [facility] despite asking pt to ask providers there to forward information to [place]. I do not think pt has filled out a release of information form either, which I have also asked him to do in the past (either at our office or at [facility]).   I have also considered referring him to urology in case his abdominal complaints are more related to prostate issues. Note he was seen at the ED twice in the past few days (once today), and it appears ED providers were concerned for prostatitis, so urology referral is reasonable, at least, depending on if pt agrees or not. As I recall, he wanted to be seen by GI (at least to start with), hence his referral to [facility].  I will be happy to discuss all this with pt at an office visit, but pt has been very difficult to work with, with me, Dr. Otis Dials, and previous residents (Dr. Loraine Maple, most recently) -- see past office notes from myself and from them. Pt was reluctant to accept referral to [facility], to start  with, and has come into clinic and called several times asking about clarification of imaging orders and results that I do not have, because imaging was ordered by and resulted to providers at [facility] as discussed above. At this point, I'm not sure what else to offer pt in terms of referral, other than potentially referring him to [facility] (Dr. Fuller Plan specifically contacted me declining to see pt again as he felt pt would be better served at a tertiary academic center).  --CMS

## 2014-01-27 NOTE — Telephone Encounter (Signed)
Pt called and would like a referral to see a stomach doctor. jw

## 2014-01-27 NOTE — ED Notes (Signed)
Per pt, states increased urination for a few days-states he does drink a lot of water-also states his food is not "going down all the way" want to know if these two things are related-states no burning but "stings" when he is done

## 2014-01-27 NOTE — Telephone Encounter (Signed)
Pt notified that office policy is patient needs to be seen by PCP before any referral is given.  Patient's last OV was January 2015.  Pt verbalized understanding.  Adaly Puder, Loralyn Freshwater, [place]

## 2014-01-27 NOTE — Discharge Instructions (Signed)
Abdominal Pain, Adult °Many things can cause abdominal pain. Usually, abdominal pain is not caused by a disease and will improve without treatment. It can often be observed and treated at home. Your health care provider will do a physical exam and possibly order blood tests and X-rays to help determine the seriousness of your pain. However, in many cases, more time must pass before a clear cause of the pain can be found. Before that point, your health care provider may not know if you need more testing or further treatment. °HOME CARE INSTRUCTIONS  °Monitor your abdominal pain for any changes. The following actions may help to alleviate any discomfort you are experiencing: °· Only take over-the-counter or prescription medicines as directed by your health care provider. °· Do not take laxatives unless directed to do so by your health care provider. °· Try a clear liquid diet (broth, tea, or water) as directed by your health care provider. Slowly move to a bland diet as tolerated. °SEEK MEDICAL CARE IF: °· You have unexplained abdominal pain. °· You have abdominal pain associated with nausea or diarrhea. °· You have pain when you urinate or have a bowel movement. °· You experience abdominal pain that wakes you in the night. °· You have abdominal pain that is worsened or improved by eating food. °· You have abdominal pain that is worsened with eating fatty foods. °SEEK IMMEDIATE MEDICAL CARE IF:  °· Your pain does not go away within 2 hours. °· You have a fever. °· You keep throwing up (vomiting). °· Your pain is felt only in portions of the abdomen, such as the right side or the left lower portion of the abdomen. °· You pass bloody or black tarry stools. °MAKE SURE YOU: °· Understand these instructions.   °· Will watch your condition.   °· Will get help right away if you are not doing well or get worse.   °Document Released: 07/30/2005 Document Revised: 08/10/2013 Document Reviewed: 06/29/2013 °ExitCare® Patient  Information ©2014 [employer]. °  Emergency Department Resource Guide °1) Find a Doctor and Pay Out of Pocket °Although you won't have to find out who is covered by your insurance plan, it is a good idea to ask around and get recommendations. You will then need to call the office and see if the doctor you have chosen will accept you as a new patient and what types of options they offer for patients who are self-pay. Some doctors offer discounts or will set up payment plans for their patients who do not have insurance, but you will need to ask so you aren't surprised when you get to your appointment. ° °2) Contact Your Local Health Department °Not all health departments have doctors that can see patients for sick visits, but many do, so it is worth a call to see if yours does. If you don't know where your local health department is, you can check in your phone book. The CDC also has a tool to help you locate your state's health department, and many state websites also have listings of all of their local health departments. ° °3) Find a Walk-in Clinic °If your illness is not likely to be very severe or complicated, you may want to try a walk in clinic. These are popping up all over the country in pharmacies, drugstores, and shopping centers. They're usually staffed by nurse practitioners or physician assistants that have been trained to treat common illnesses and complaints. They're usually fairly quick and inexpensive. However, if you have   serious medical issues or chronic medical problems, these are probably not your best option. ° °No Primary Care Doctor: °- Call [facility] at  832-8000 - they can help you locate a primary care doctor that  accepts your insurance, provides certain services, etc. °- Physician Referral Service- 1-800-533-3463 ° °Chronic Pain Problems: °Organization         Address  Phone   Notes  °[facility]  (336) 297-2271 Patients need to be referred by their primary care  doctor.  ° °Medication Assistance: °Organization         Address  Phone   Notes  °[place] [facility] [address] °[place], [place] [address] (336) 641-8030 --Must be a resident of [place] °-- Must have NO insurance coverage whatsoever (no Medicaid/ Medicare, etc.) °-- The pt. MUST have a primary care doctor that directs their care regularly and follows them in the community °  °[facility]  (866) 331-1348   °[employer]  (888) 892-1162   ° °Agencies that provide inexpensive medical care: °Organization         Address  Phone   Notes  °[facility]  (336) 832-8035   °[facility]    (336) 832-7272   °[facility] [address] °[place], [place] [address] (336) 832-4777   °[facility] [address], °[place] (336) 271-4999   °[facility]    (336) 373-0678   °[facility]    (336) 272-1050   °[facility] ° [address], [place] Phone:  (336) 832-4444, Fax:  (336) 832-4440 Hours of Operation:  9 am - 6 pm, M-F.  Also accepts Medicaid/Medicare and self-pay.  °[facility] ° [address], [place] Phone: (336) 832-3150, Fax: (336) 832-3151. Hours of Operation:  8:30 am - 5:30 pm, M-F.  Also accepts Medicaid and self-pay.  °[facility] [place] [address], [place] Phone: (336) 878-6027   °[facility] [address], [place], [place] (336)723-1848, Ext. 123 Mondays & Thursdays: 7-9 AM.  First 15 patients are seen on a first come, first serve basis. °  ° °Medicaid-accepting [place] Providers: ° °Organization         Address  Phone   Notes  °[facility] [address], [place] (336) 641-2100 Also accepts self-pay patients.  °[facility] [address], [place] ° (336) 856-9996   °[facility] [address], [place] (336) 288-8857   °[facility] [address] [place] [address], [place] (336) 299-7000   °Veita Bland [address], [place]  ° (336) 373-1557 Only accepts [place] Access Medicaid patients after they have their name applied to their card.  ° °Self-Pay (no insurance) in [place]: ° °Organization         Address  Phone   Notes  °Sickle Cell Patients, [facility] [address], [place] (336) 832-1970   °[facility] [address], [place] (336) 832-4400   °[facility] ° [address], [place] (336) 992-4800   °[facility]/Dr. Osei-Bonsu ° [address] [place] [address], [place] or [address], [place] (336) 841-8500 Phone number for both [place] and [place] locations   is the same.  °[facility] [address], [place] (336) 299-0000   °[facility] [address] [place] [address], [place] or [address] (336) 852-7530 °(336) 878-2260   °[facility] [address], [place] (336) 350-1642, phone; (336) 294-5005, fax Sees patients 1st and 3rd Saturday of every month.  Must not qualify for public or private insurance (i.e. Medicaid, Medicare, [place] Health Choice, Veterans' Benefits) • Household income should be no more than 200% of the poverty level •The clinic cannot treat you if you are pregnant or think you are pregnant • Sexually transmitted diseases are not treated at the clinic.  ° °Dental Care: °Organization         Address  Phone  Notes  °[facility] [facility] [address], [place] (336) 641-6152 Accepts children up to age 21 who are enrolled in Medicaid or [place] Health Choice; pregnant women with a Medicaid card; and children who have applied for Medicaid or [place] Health Choice, but were declined, whose parents can pay a reduced fee at time of  service.  °[facility] [place]  [address], [place] (336) 641-7733 Accepts children up to age 21 who are enrolled in Medicaid or [place] Health Choice; pregnant women with a Medicaid card; and children who have applied for Medicaid or [place] Health Choice, but were declined, whose parents can pay a reduced fee at time of service.  °[facility] ° [address], [place] (336) 641-4533 Patients are seen by appointment only. Walk-ins are not accepted. [facility] will see patients 18 years of age and older. °Monday - Tuesday (8am-5pm) °Most Wednesdays (8:30-5pm) °$30 per visit, cash only  °[facility] ° [address], [place] (336) 641-4533 Patients are seen by appointment only. Walk-ins are not accepted. [facility] will see patients 18 years of age and older. °One Wednesday Evening (Monthly: Volunteer Based).  $30 per visit, cash only  °[facility]  (919) 537-3737 for adults; Children under age 4, call Graduate Pediatric Dentistry at (919) [phone]. Children aged 4-14, please call (919) 537-3737 to request a pediatric application. ° Dental services are provided in all areas of dental care including fillings, crowns and bridges, complete and partial dentures, implants, gum treatment, root canals, and extractions. Preventive care is also provided. Treatment is provided to both adults and children. °Patients are selected via a lottery and there is often a waiting list. °  °[facility] [address], ° ° (336) 763-8833 [email or web address] °  °[facility] [address], [place], [place] (336)723-1848, Ext. 123 Second and Fourth Thursday of each month, opens at 6:30 AM; Clinic ends at 9 AM.  Patients are seen on a first-come first-served basis, and a limited number are seen during each clinic.  ° °[facility] ° [address], [place],   (336) 723-7904   Eligibility Requirements °You must have lived in [place], [place], or [place] counties for at least the last three months. °  You cannot be eligible for state or federal sponsored healthcare insurance, including Veterans Administration, Medicaid, or Medicare. °  You generally cannot be eligible for healthcare insurance through your employer.  °  How to apply: °Eligibility screenings are held every Tuesday and Wednesday afternoon from 1:00 pm until 4:00 pm. You do not need an appointment   for the interview!  °[facility] [address], [place], [place] 336-631-2330   °[facility]  336-342-8273   °[facility]  336-703-3100   °[facility]  336-570-6415   ° °Behavioral Health Resources in the Community: °Intensive Outpatient Programs °Organization         Address  Phone  Notes  °[facility] [address], [place], [place] 336-878-6098   °[facility] [address], [place], [place] 336-832-9800   °[facility] [address], [place], [place] ° 336-882-2125   °[facility] [address],  °[place], [place] 1-800-853-5163 or 336-641-4981   °Substance Abuse Resources °Organization         Address  Phone  Notes  °[facility]  336-882-2125   °[facility]  336-784-9470   °[facility]  336-285-9073   °[facility]  336-845-3988   °Residential & Outpatient Substance Abuse Program  1-800-659-3381   °Psychological Services °Organization         Address  Phone  Notes  °[facility]  336- 832-9600   °[facility]  336- 378-7881   °[facility] [address], [place] 1-800-853-5163 or 336-641-4981   ° °Mobile Crisis Teams °Organization         Address  Phone  Notes  °[facility], Mobile Crisis Care Unit  1-877-626-1772   °Assertive °[facility] ° [address] [place], [place] 336-834-9664   °Sharon DeEsch [address] °[place] 336-554-5454   ° °Self-Help/Support Groups °Organization         Address  Phone             Notes  °[facility] - variety of support groups  336- 373-1402 Call for more information  °Narcotics Anonymous (NA), [facility] [address], °[place]  2 meetings at this location  ° °Residential Treatment Programs °Organization         Address  Phone  Notes  °[facility] [address],    °[place]  1-866-801-8205   °[facility] ° [address], [place], [place] 704-293-8524   °[facility] [address], [place] 336-845-3988 Admissions: 8am-3pm M-F  °[facility] [address]    °[place], [place] 336-841-1104   °[facility] [address], [place], [place] 336-379-7146   °[facility] [address]  °[place], [place] 336-285-9073   °[facility] Intensive Outpatient [address], [place], [place] 336-852-3033   °[facility] [address]  °[place], [place] 1-877-615-2722 or 336-784-9470   °[facility] [address] [place], [place] 336-227-7417 Accepts Medicaid  °[facility] [address]  °[place] 1-800-659-3381 Substance Abuse/Addiction Treatment  ° °[place] Behavioral Health Resources °Organization         Address  Phone  Notes  °[facility]  (888) 581-9988   °Julie Brannon, PhD [address] [place],    (336) 349-5553 or (336) 951-0000   °[facility]   [address] °[place],  (336) 349-4454   °[facility] [address], [place],  (336) 342-8316 Insurance/Medicaid/sponsorship through [facility]  °[facility] [address]                                      [place], [place] (336) 342-8316  Therapy/tele-psych/case  °[facility] [address]  ° [place], [place] (336) 349-2233    °Dr. Arfeen  (336) 349-4544   °[facility] [place]  [employer] [place] [facility] 1) [address], [place] °2) [address], [place] °3)  [address], [place] (336) 349-3220 °(336) 342-7768 ° °(336) 342-8140   °[place] [facility] (336) 342-1394 or (336) 342-3537 (After Hours)    ° °   °

## 2014-01-30 ENCOUNTER — Encounter: Payer: Self-pay | Admitting: Family Medicine

## 2014-01-30 DIAGNOSIS — M545 Low back pain, unspecified: Secondary | ICD-10-CM

## 2014-01-30 NOTE — Progress Notes (Signed)
Patient ID: Kenneth Roberts, male   DOB: 1941-02-12, 73 y.o.   MRN: [id number]  S/O: Pt has complained in the past of chronic abdominal pain and chronic back pain, which may be inter-related. Pain in his back is typically worse with movement and standing / being active for long periods. He has no bleeding in his stool, weight loss, or change in his bowel habits.   Last vitals:  Vitals Conversion 11/24/2013  BP 140/90  Height 5\' 10"   Weight 180 lbs  Pulse 81  He has muscle tightness and pain diffusely in his lower back. He is able to ambulate without problem though changes in position for exam increase his pain.  He has no LE swelling and has normal ROM, strength, and sensation in both legs.  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 Will complete Rx for back brace through [facility]. Other work-up on-going for chronic abdominal pain. Pt to f/u as needed.

## 2014-02-02 NOTE — ED Provider Notes (Signed)
CSN: [id number]     Arrival date & time 01/27/14  4580 History   First MD Initiated Contact with Patient 01/27/14 [phone]     Chief Complaint  Patient presents with  . increased urination      (Consider location/radiation/quality/duration/timing/severity/associated sxs/prior Treatment) HPI  73yM with multiple complaints. Primarily that urinating more frequently. No dysuria or hematuria. Some crampy abdominal pain. Does not lateralize. No fever or chills. No n/v. No diarrhea. Seen about a month ago for similar complaints. Additionally complaining of dysphagia. Feels like solid food sometimes gets stuck and will get a stinging feeling "right after it goes down."  Past Medical History  Diagnosis Date  . Hypertension   . Cataract   . GERD (gastroesophageal reflux disease)   . Leukopenia   . Hiatal hernia   . BPH (benign prostatic hypertrophy)   . Barrett's esophagus without dysplasia 08/2006  . Internal hemorrhoids   . Adenomatous colon polyp 01/2009  . Fatty liver   . Hepatic hemangioma   . Anemia    Past Surgical History  Procedure Laterality Date  . Cataract extraction      Left eye   Family History  Problem Relation Age of Onset  . Diabetes Father   . Diabetes Brother   . Diabetes Sister    History  Substance Use Topics  . Smoking status: Former Smoker    Quit date: 02/16/1981  . Smokeless tobacco: Never Used  . Alcohol Use: 7.5 oz/week    15 drink(s) per week    Review of Systems  All systems reviewed and negative, other than as noted in HPI.   Allergies  Prilosec  Home Medications   Current Outpatient Rx  Name  Route  Sig  Dispense  Refill  . amLODipine (NORVASC) 5 MG tablet   Oral   Take 1 tablet (5 mg total) by mouth daily.   30 tablet   3   . mirabegron ER (MYRBETRIQ) 50 MG TB24 tablet   Oral   Take 1 tablet (50 mg total) by mouth daily.   30 tablet   3   . terazosin (HYTRIN) 10 MG capsule   Oral   Take 1 capsule (10 mg total) by mouth  daily.   30 capsule   1   . dicyclomine (BENTYL) 20 MG tablet   Oral   Take 1 tablet (20 mg total) by mouth 4 (four) times daily as needed for spasms (abdominal cramps).   20 tablet   0    BP 128/77  Pulse 64  Temp(Src) 98.3 F (36.8 C) (Oral)  Resp 18  SpO2 100% Physical Exam  Nursing note and vitals reviewed. Constitutional: He appears well-developed and well-nourished. No distress.  HENT:  Head: Normocephalic and atraumatic.  Eyes: Conjunctivae are normal. Right eye exhibits no discharge. Left eye exhibits no discharge.  Neck: Neck supple.  Cardiovascular: Normal rate, regular rhythm and normal heart sounds.  Exam reveals no gallop and no friction rub.   No murmur heard. Pulmonary/Chest: Effort normal and breath sounds normal. No respiratory distress.  Abdominal: Soft. He exhibits no distension. There is no tenderness.  Genitourinary:  No cva tenderness  Musculoskeletal: He exhibits no edema and no tenderness.  Neurological: He is alert.  Skin: Skin is warm and dry.  Psychiatric: He has a normal mood and affect. His behavior is normal. Thought content normal.    ED Course  Procedures (including critical care time) Labs Review Labs Reviewed  CBC WITH DIFFERENTIAL - Abnormal;  Notable for the following:    WBC 2.8 (*)    HCT 37.7 (*)    All other components within normal limits  BASIC METABOLIC PANEL - Abnormal; Notable for the following:    GFR calc non Af Amer 81 (*)    All other components within normal limits  URINALYSIS, ROUTINE W REFLEX MICROSCOPIC   Imaging Review No results found.   EKG Interpretation None      MDM   Final diagnoses:  Abdominal pain    73yM with abdominal and increased urinary frequency.  Benign exam. UA unremarkable. Labs unremarkable aside from leukopenia which does not appear to be acute. ANC normal. Afebrile.     Virgel Manifold, MD 02/02/14 [phone]

## 2014-02-10 ENCOUNTER — Other Ambulatory Visit: Payer: Self-pay | Admitting: Family Medicine

## 2014-02-13 ENCOUNTER — Encounter: Payer: Self-pay | Admitting: Gastroenterology

## 2014-02-20 ENCOUNTER — Ambulatory Visit: Payer: PRIVATE HEALTH INSURANCE | Admitting: Family Medicine

## 2014-03-10 ENCOUNTER — Ambulatory Visit: Payer: Medicare Other | Admitting: Podiatrist

## 2014-03-17 ENCOUNTER — Ambulatory Visit: Payer: Medicare Other | Admitting: Podiatrist

## 2014-03-24 ENCOUNTER — Encounter: Payer: Self-pay | Admitting: Podiatrist

## 2014-03-24 ENCOUNTER — Ambulatory Visit (INDEPENDENT_AMBULATORY_CARE_PROVIDER_SITE_OTHER): Payer: Medicare Other | Admitting: Podiatrist

## 2014-03-24 VITALS — BP 117/89 | HR 77 | Resp 12

## 2014-03-24 DIAGNOSIS — B351 Tinea unguium: Secondary | ICD-10-CM

## 2014-03-24 DIAGNOSIS — M79609 Pain in unspecified limb: Secondary | ICD-10-CM

## 2014-03-24 NOTE — Progress Notes (Signed)
HPI: Patient presents today for follow up of foot and nail care. Past medical history, meds, and allergies reviewed.  Objective: Neurovascular status unchanged with palpable pedal pulses and neurological sensation intact. Patients nails are elongated, thickened, discolored, dystrophic with ingrown deformity present. + hyperkeratotic and pre-ulcerative lesions present submet 2, 3 bilateral. Porokeratotic appearance with intact integment post debridement noted  Assessment: symptomatic mycotic nails, prominent metatarsal bilateral, hyperkeratotic lesion x 4  Plan: Discussed treatment options and alternatives. Debrided nails without complication. Debrided hyperkeratotic lesions without complication. Return appointment recommended at routine intervals of 3 months.     

## 2014-03-24 NOTE — Patient Instructions (Signed)
[facility]-- on [address] (Dr. Fontaine No is good-- they are all good)

## 2014-03-28 ENCOUNTER — Telehealth: Payer: Self-pay | Admitting: *Deleted

## 2014-03-28 NOTE — Telephone Encounter (Signed)
If he looks at the printed information he received the day of his visit, I wrote down the name of the dermatologist I recommended-- its Dr. Marcine Matar at [facility] next door.

## 2014-03-28 NOTE — Telephone Encounter (Signed)
I called and left him a message that the information was printed out for him on his check-out papers.  She recommended Dr. Marcine Matar at  [facility] next door to our office.  Please call if you have any further questions.

## 2014-03-28 NOTE — Telephone Encounter (Signed)
She was supposed to leave a name and number for me about my hand.  I know she's pretty busy.  I guess she forgot.

## 2014-04-03 ENCOUNTER — Telehealth: Payer: Self-pay | Admitting: *Deleted

## 2014-04-03 NOTE — Telephone Encounter (Signed)
She left the name of the Dermatologist but she didn't leave the phone number.  I called and left him a message that Dr. Dorena Dew phone number is [phone].

## 2014-04-13 ENCOUNTER — Ambulatory Visit (INDEPENDENT_AMBULATORY_CARE_PROVIDER_SITE_OTHER): Payer: PRIVATE HEALTH INSURANCE | Admitting: Family Medicine

## 2014-04-13 ENCOUNTER — Encounter: Payer: Self-pay | Admitting: Family Medicine

## 2014-04-13 VITALS — BP 120/78 | HR 70 | Temp 98.2°F | Resp 18 | Wt 183.0 lb

## 2014-04-13 DIAGNOSIS — B351 Tinea unguium: Secondary | ICD-10-CM

## 2014-04-13 DIAGNOSIS — L301 Dyshidrosis [pompholyx]: Secondary | ICD-10-CM

## 2014-04-13 DIAGNOSIS — L609 Nail disorder, unspecified: Secondary | ICD-10-CM | POA: Insufficient documentation

## 2014-04-13 DIAGNOSIS — L608 Other nail disorders: Secondary | ICD-10-CM

## 2014-04-13 NOTE — Patient Instructions (Signed)
Thank you for coming in, today!  I agree with your podiatrist that you need to continue to see them for your feet and the dermatologist for your hands. I will place the referrals today. If you don't hear from them in 1 week, call their office. If you don't hear anything in 2 weeks, call us back. In the meantime, you can try over-the-counter steroid creams and moisturizer for your hands.  Come back to see me as you need. Please feel free to call with any questions or concerns at any time, at [phone]. --Dr. Venetia Maxon

## 2014-04-13 NOTE — Assessment & Plan Note (Signed)
Bilateral hands with very dry, flaky skin of palms and with skin underlying broken / dysmorphic nails thickened and scaly as well, right worse than left. No evidence of bacterial superinfection, no cellulitic changes, and no bleeding / drainage. Uncertain etiology; DDx includes but not limited to contact dermatitis, psoriasis, severe eczema. Podiatrist suggested pt see dermatologist, which he would like to do, and with which I agree. Podiatrist suggested Dr. Derrel Nip of [facility]. Referral placed today. In the meantime recommended OTC steroid creams for dry skin to palms and moisturizers. Pt voiced understanding.

## 2014-04-13 NOTE — Assessment & Plan Note (Signed)
Multiple toenails of both feet thickened, discolored / dysmorphic. Sees podiatry regularly but states he needs a new referral; last saw them a couple of weeks ago with repeat appointment in August. Advised regular follow-up with podiatry and re-referral placed today. No evidence for bacterial superinfection or other skin breakdown. Suggested OTC antifungals if desired, otherwise defer to podiatry.

## 2014-04-13 NOTE — Progress Notes (Signed)
   Subjective:    Patient ID: Kenneth Roberts, male    DOB: October 31, 1941, 73 y.o.   MRN: [id number]  HPI: Pt presents to clinic for nail and skin issues of his feet and hands. He complains of problems with his nails for years. He has not been seen for his hands before but sees podiatry for his feet and calluses. His nails break easily and he feels like it's "probably fungus." His podiatrist recommended Dr. Fontaine No at [facility]. He denies redness or bleeding / drainage but his nails and tips of his fingers itch and occasionally swell and are painful. He has not taken medications for this in the past. He would like to be seen by dermatology for it, as the podiatrist recommended. He denies rashes otherwise, but does have diffuse dry skin, occasionally.  Review of Systems: As above.     Objective:   Physical Exam BP 120/78  Pulse 70  Temp(Src) 98.2 F (36.8 C) (Oral)  Resp 18  Wt 183 lb (83.008 kg)  SpO2 98% Gen: well-appearing adult male in NAD. Hands: Bilateral palms with markedly dry, scaly patches without significant erythema and no bleeding / drainage  Bilateral hands with fingernails dysmorphic, broken / thickened  Nails of right hand worse than left, with severe chipping / peeling, some areas of thickening  Underlying skin beneath nails also thick / dry / scaly, right worse than left Feet: markedly thickened, discolored, and dysmorphic nails present bilaterally  Recently-shaved callouses present to heels, without drainage / bleeding / ulceration Skin: otherwise scattered areas of mild dryness but no rashes or significant lesions noted Ext: otherwise warm, well-perfused     Assessment & Plan:

## 2014-04-13 NOTE — Assessment & Plan Note (Signed)
Marked bilaterally, though right worse than left; nails thick, brittle, broken, dysmorphic. Possibly related to hand dermatitis of uncertain etiology. See also that problem list note. Pt referred to dermatology, today. F/u as needed, otherwise.

## 2014-04-17 ENCOUNTER — Other Ambulatory Visit: Payer: Self-pay | Admitting: Family Medicine

## 2014-04-18 ENCOUNTER — Telehealth: Payer: Self-pay | Admitting: Family Medicine

## 2014-04-18 DIAGNOSIS — Z125 Encounter for screening for malignant neoplasm of prostate: Secondary | ICD-10-CM

## 2014-04-18 NOTE — Telephone Encounter (Signed)
Needs referral to cancer center. Wants to be tested for cancer. Doesn't think dr street tested for cancer when he did his blood work. Had a colonoscopy 3 yrs ago. On the record, it states there were polyps. This paper that he got about 2-3 weeks ago- he said it came from the dermatologist?

## 2014-04-18 NOTE — Telephone Encounter (Signed)
Explained to patient that he doesn't need referral to cancer center, and that colonoscopy isnt needed until 2017. Patient will come in tomm morning for lab draw to check PSA.

## 2014-04-19 ENCOUNTER — Other Ambulatory Visit: Payer: PRIVATE HEALTH INSURANCE

## 2014-04-19 NOTE — Progress Notes (Signed)
PSA DONE TODAY Kenneth Roberts

## 2014-04-20 ENCOUNTER — Encounter: Payer: Self-pay | Admitting: Family Medicine

## 2014-04-20 LAB — PSA: PSA: 0.35 ng/mL (ref <=4.00)

## 2014-04-26 ENCOUNTER — Telehealth: Payer: Self-pay | Admitting: *Deleted

## 2014-04-26 NOTE — Telephone Encounter (Signed)
Calling about appointment yesterday.  Everything had been confirmed.  But, I still haven't heard from family practice.  Have they called you all?  I attempted to call the patient back.  I left him a message to return my call.

## 2014-04-27 ENCOUNTER — Other Ambulatory Visit: Payer: PRIVATE HEALTH INSURANCE

## 2014-04-28 ENCOUNTER — Telehealth: Payer: Self-pay | Admitting: Family Medicine

## 2014-04-28 NOTE — Telephone Encounter (Signed)
Please call pt back regarding test for colon c/a.

## 2014-04-28 NOTE — Telephone Encounter (Signed)
I am post-call today and not available in clinic again until Tuesday. Please call Kenneth Roberts back -- he is essentially up to date on colon cancer screening and doesn't need any further testing at this time. He had a colonoscopy in 2012 and had only a couple of non-cancerous polyps, so he is not due for testing / screening for colon cancer until 2017 (when he will need a repeat colonoscopy).  However, if he wants, he can come in for a nurse visit to be given stool fecal occult blood cards and instructions on how to use them. He has also seen GI at [facility], as well, and at one point they had ordered an MRI. If they did an MRI, he can talk to them about the results and/or they can at least discuss if they have any recommendations based on what they talked to him about during their office visit or what they saw on the MRI (presumably an abdominal MRI would show any worrisome masses). Thanks. --CMS

## 2014-05-03 ENCOUNTER — Ambulatory Visit: Payer: PRIVATE HEALTH INSURANCE | Admitting: Internal Medicine

## 2014-05-11 ENCOUNTER — Ambulatory Visit: Payer: PRIVATE HEALTH INSURANCE | Admitting: Family Medicine

## 2014-05-15 ENCOUNTER — Telehealth: Payer: Self-pay | Admitting: Family Medicine

## 2014-06-22 ENCOUNTER — Ambulatory Visit: Payer: Medicare Other | Admitting: Podiatrist

## 2014-06-23 ENCOUNTER — Ambulatory Visit (INDEPENDENT_AMBULATORY_CARE_PROVIDER_SITE_OTHER): Payer: Medicare Other | Admitting: Podiatrist

## 2014-06-23 ENCOUNTER — Encounter: Payer: Self-pay | Admitting: Podiatrist

## 2014-06-23 VITALS — BP 110/74 | HR 84 | Resp 18

## 2014-06-23 DIAGNOSIS — Q828 Other specified congenital malformations of skin: Secondary | ICD-10-CM

## 2014-06-23 DIAGNOSIS — M79676 Pain in unspecified toe(s): Principal | ICD-10-CM

## 2014-06-23 DIAGNOSIS — M216X9 Other acquired deformities of unspecified foot: Secondary | ICD-10-CM

## 2014-06-23 DIAGNOSIS — B351 Tinea unguium: Secondary | ICD-10-CM

## 2014-06-23 DIAGNOSIS — M79609 Pain in unspecified limb: Secondary | ICD-10-CM

## 2014-06-23 NOTE — Progress Notes (Signed)
HPI: Patient presents today for follow up of foot and nail care. Past medical history, meds, and allergies reviewed.  Objective: Neurovascular status unchanged with palpable pedal pulses and neurological sensation intact. Patients nails are elongated, thickened, discolored, dystrophic with ingrown deformity present. + hyperkeratotic and pre-ulcerative lesions present submet 2, 3 bilateral. Porokeratotic appearance with intact integment post debridement noted  Assessment: symptomatic mycotic nails, prominent metatarsal bilateral, hyperkeratotic lesion x 4  Plan: Discussed treatment options and alternatives. Debrided nails without complication. Debrided hyperkeratotic lesions without complication. Return appointment recommended at routine intervals of 3 months.

## 2014-06-30 ENCOUNTER — Telehealth: Payer: Self-pay | Admitting: *Deleted

## 2014-06-30 NOTE — Telephone Encounter (Signed)
I attempted to return his call.  He called previously and just left his name and phone number, no message.

## 2014-07-04 NOTE — Telephone Encounter (Signed)
I attempted to call him again.  I left him a message to call me if he has any questions or concerns.

## 2014-07-14 ENCOUNTER — Telehealth: Payer: Self-pay | Admitting: Family Medicine

## 2014-07-14 DIAGNOSIS — R109 Unspecified abdominal pain: Secondary | ICD-10-CM

## 2014-07-14 DIAGNOSIS — Z86010 Personal history of colonic polyps: Secondary | ICD-10-CM

## 2014-07-14 NOTE — Telephone Encounter (Signed)
Pt requesting a referral to the cancer center. States he is a patient there but cannot get an appt without an referral from [place]. Pt phone # [phone]

## 2014-07-17 NOTE — Telephone Encounter (Signed)
Called pt back to discuss; [facility]'m [facility] -- he had a PSA checked which was normal as documented by A. Benton, but there aren't other blood tests that I know of that would have been appropriate for him, and none were ordered, regardless -- perhaps a CEA would be possible but I'm not sure of necessity/appropriateness for screening???). He was not available and a message was left for him that I will call him back likely sometime tomorrow. Please refer to previous phone and office notes; pt has been adamant in his requests for testing and referrals and has been very difficult to negotiate with about appropriate testing / referrals, in the past. Will discuss with him and document outcome. --CMS

## 2014-07-17 NOTE — Telephone Encounter (Signed)
Left message with male for patient to call back. 

## 2014-07-17 NOTE — Telephone Encounter (Signed)
Patient returned called, informed that MD would call him back.

## 2014-07-17 NOTE — Telephone Encounter (Signed)
Spoke with patient, he states he need to be checked for cancer. Informed patient that he had a normal PSA and doesn't need another colonoscopy until 2017. Patient states that they were supposed to check his blood for all the other cancers when he had his blood drawn for PSA. Explained to patient that he would need to come in and speak with PCP. Patient became upset and states he doesn't need appointment and just wants referral, patient then asks for PCP to call him. Will forward to PCP.

## 2014-07-18 NOTE — Addendum Note (Signed)
Addended by: Emmaline Kluver on: 07/18/2014 01:44 PM   Modules accepted: Orders

## 2014-07-18 NOTE — Telephone Encounter (Addendum)
Called pt back this morning. Discussed pt's current concerns; he is not having weight loss, fevers, N/V, or change in his bowel habits, but he continues to have significant chronic, generalized abdominal pain, and has had colonic polyps in the past. He is very concerned with being evaluated for "cancer in [his] body" and is relieved that his PSA was normal, but "wants to get everything else checked out." He was seen at the cancer center "years ago" related to his prostate, but states that he is not concerned about that, currently, but rather his abdomen and "anything else that might be going on." (On chart review, there are converted e-chart notes with Drs. Humphrey Rolls and [place] names on them, but not notes I can see or read).  Explained to pt that there is not any other blood testing that I would recommend currently (CEA is NOT appropriate for screening or diagnosis), but that I will place a referral to the cancer center (oncology) to be evaluated for his chronic pain and history of polyps, and that he should expect a call with an appointment time or to schedule an appointment in the next couple of weeks. Instructed him to follow up as needed (sooner rather than later if any new symptoms do arise) and to call back at the end of the month if he has not heard anything about an appointment by that time. --CMS

## 2014-09-15 ENCOUNTER — Ambulatory Visit (INDEPENDENT_AMBULATORY_CARE_PROVIDER_SITE_OTHER): Payer: Medicare Other | Admitting: Podiatrist

## 2014-09-15 ENCOUNTER — Encounter: Payer: Self-pay | Admitting: Podiatrist

## 2014-09-15 DIAGNOSIS — B351 Tinea unguium: Secondary | ICD-10-CM

## 2014-09-15 DIAGNOSIS — M79676 Pain in unspecified toe(s): Secondary | ICD-10-CM

## 2014-09-15 DIAGNOSIS — L84 Corns and callosities: Secondary | ICD-10-CM

## 2014-09-15 DIAGNOSIS — M216X9 Other acquired deformities of unspecified foot: Secondary | ICD-10-CM

## 2014-09-15 NOTE — Progress Notes (Signed)
HPI: Patient presents today for follow up of foot and nail care. Past medical history, meds, and allergies reviewed.  Objective: Neurovascular status unchanged with palpable pedal pulses and neurological sensation intact. Patients nails are elongated, thickened, discolored, dystrophic with ingrown deformity present. + hyperkeratotic and pre-ulcerative lesions present submet 5 bilateral. Porokeratotic appearance with intact integment post debridement noted  Assessment: symptomatic mycotic nails, prominent metatarsal bilateral, hyperkeratotic lesion x 2  Plan: Discussed treatment options and alternatives. Debrided nails without complication. Debrided hyperkeratotic lesions without complication. Return appointment recommended at routine intervals of 3 months.     

## 2014-09-21 ENCOUNTER — Telehealth: Payer: Self-pay | Admitting: Family Medicine

## 2014-09-21 DIAGNOSIS — R1084 Generalized abdominal pain: Principal | ICD-10-CM

## 2014-09-21 DIAGNOSIS — G8929 Other chronic pain: Secondary | ICD-10-CM

## 2014-09-21 NOTE — Telephone Encounter (Signed)
Pt is requesting a referral to a stomach specialist, says MD is already aware

## 2014-09-22 NOTE — Telephone Encounter (Signed)
Patient informed that I have to fax all notes from [place] before Dr Minerva Areola office will schedule him. Patient informed notes were faxed and we are awaiting response, expressed understanding.

## 2014-09-22 NOTE — Telephone Encounter (Signed)
Spoke with patient, he prefers to go to Dr. Benson Norway. Informed patient I would call him back with an appointment.

## 2014-09-22 NOTE — Telephone Encounter (Signed)
Attempted to call pt back. Pt has been seen by Dr. Fuller Plan here in [place], with prior colonoscopy a few years ago, recommending a repeat in 2017. Pt was referred to [facility] and saw Dr. Dara Lords there, and per  [place] had an MRI which showed a benign liver hemangioma and normal pancreatic duct, no gallbladder stones. Per telephone notes from [facility], pt called there multiple times requesting colonoscopy and was told multiple times that there is no indication for one until recommended repeat in 2017.  See prior phone and office notes from myself and Dr. Otis Dials before that, as well; pt has been adamant about referrals and difficult to communicate with, and has expressed displeasure with different providers (one phone note from [facility] stated he had requested to see another physician other than Dr. Dara Lords). Will place referral for GI again, either back to [facility] or to [facility] as [facility] has been a consideration, before; note pt has had difficulty with transportation in the past. Otherwise, options are limited for referral. --CMS

## 2014-11-07 ENCOUNTER — Emergency Department (HOSPITAL_COMMUNITY)
Admission: EM | Admit: 2014-11-07 | Discharge: 2014-11-07 | Disposition: A | Discharge status: Home / Self Care | Payer: Medicare Other | Attending: Emergency Medicine | Admitting: Emergency Medicine

## 2014-11-07 ENCOUNTER — Encounter (HOSPITAL_COMMUNITY): Payer: Self-pay | Admitting: Family Medicine

## 2014-11-07 ENCOUNTER — Emergency Department (HOSPITAL_COMMUNITY): Payer: Medicare Other

## 2014-11-07 DIAGNOSIS — R1084 Generalized abdominal pain: Secondary | ICD-10-CM | POA: Diagnosis not present

## 2014-11-07 DIAGNOSIS — Z79899 Other long term (current) drug therapy: Secondary | ICD-10-CM | POA: Insufficient documentation

## 2014-11-07 DIAGNOSIS — K219 Gastro-esophageal reflux disease without esophagitis: Secondary | ICD-10-CM | POA: Insufficient documentation

## 2014-11-07 DIAGNOSIS — Z862 Personal history of diseases of the blood and blood-forming organs and certain disorders involving the immune mechanism: Secondary | ICD-10-CM | POA: Diagnosis not present

## 2014-11-07 DIAGNOSIS — Z86010 Personal history of colonic polyps: Secondary | ICD-10-CM | POA: Diagnosis not present

## 2014-11-07 DIAGNOSIS — R197 Diarrhea, unspecified: Secondary | ICD-10-CM | POA: Diagnosis not present

## 2014-11-07 DIAGNOSIS — R0602 Shortness of breath: Secondary | ICD-10-CM | POA: Diagnosis not present

## 2014-11-07 DIAGNOSIS — Z87891 Personal history of nicotine dependence: Secondary | ICD-10-CM | POA: Diagnosis not present

## 2014-11-07 DIAGNOSIS — N400 Enlarged prostate without lower urinary tract symptoms: Secondary | ICD-10-CM | POA: Insufficient documentation

## 2014-11-07 DIAGNOSIS — Z8669 Personal history of other diseases of the nervous system and sense organs: Secondary | ICD-10-CM | POA: Insufficient documentation

## 2014-11-07 DIAGNOSIS — I10 Essential (primary) hypertension: Secondary | ICD-10-CM | POA: Diagnosis not present

## 2014-11-07 DIAGNOSIS — R1030 Lower abdominal pain, unspecified: Secondary | ICD-10-CM | POA: Diagnosis present

## 2014-11-07 DIAGNOSIS — R52 Pain, unspecified: Secondary | ICD-10-CM

## 2014-11-07 LAB — CBC WITH DIFFERENTIAL/PLATELET
Basophils Absolute: 0 10*3/uL (ref 0.0–0.1)
Basophils Relative: 1 % (ref 0–1)
EOS ABS: 0 10*3/uL (ref 0.0–0.7)
Eosinophils Relative: 0 % (ref 0–5)
HEMATOCRIT: 40.1 % (ref 39.0–52.0)
HEMOGLOBIN: 14.5 g/dL (ref 13.0–17.0)
LYMPHS ABS: 1.6 10*3/uL (ref 0.7–4.0)
Lymphocytes Relative: 51 % — ABNORMAL HIGH (ref 12–46)
MCH: 32.8 pg (ref 26.0–34.0)
MCHC: 36.2 g/dL — ABNORMAL HIGH (ref 30.0–36.0)
MCV: 90.7 fL (ref 78.0–100.0)
Monocytes Absolute: 0.2 10*3/uL (ref 0.1–1.0)
Monocytes Relative: 6 % (ref 3–12)
NEUTROS ABS: 1.4 10*3/uL — AB (ref 1.7–7.7)
Neutrophils Relative %: 42 % — ABNORMAL LOW (ref 43–77)
Platelets: 184 10*3/uL (ref 150–400)
RBC: 4.42 MIL/uL (ref 4.22–5.81)
RDW: 14.8 % (ref 11.5–15.5)
WBC: 3.3 10*3/uL — AB (ref 4.0–10.5)

## 2014-11-07 LAB — I-STAT CHEM 8, ED
BUN: 14 mg/dL (ref 6–23)
CALCIUM ION: 1.15 mmol/L (ref 1.13–1.30)
Chloride: 100 mEq/L (ref 96–112)
Creatinine, Ser: 1.5 mg/dL — ABNORMAL HIGH (ref 0.50–1.35)
Glucose, Bld: 100 mg/dL — ABNORMAL HIGH (ref 70–99)
HEMATOCRIT: 45 % (ref 39.0–52.0)
HEMOGLOBIN: 15.3 g/dL (ref 13.0–17.0)
Potassium: 3.7 mmol/L (ref 3.5–5.1)
Sodium: 142 mmol/L (ref 135–145)
TCO2: 22 mmol/L (ref 0–100)

## 2014-11-07 MED ORDER — IOHEXOL 300 MG/ML  SOLN
50.0000 mL | Freq: Once | INTRAMUSCULAR | Status: AC | PRN
  Administered 2014-11-07: 50 mL via ORAL

## 2014-11-07 MED ORDER — SODIUM CHLORIDE 0.9 % IV SOLN
Freq: Once | INTRAVENOUS | Status: AC
  Administered 2014-11-07: 04:00:00 via INTRAVENOUS

## 2014-11-07 MED ORDER — IOHEXOL 300 MG/ML  SOLN
100.0000 mL | Freq: Once | INTRAMUSCULAR | Status: AC | PRN
  Administered 2014-11-07: 100 mL via INTRAVENOUS

## 2014-11-07 NOTE — ED Notes (Signed)
Per EMS, patient is complaining of lower abd pain, increased pain with movement. Also, experiencing testicle pain. Denies any nausea or vomiting but report diarrhea. Took MOM prior to arrival with no relief.

## 2014-11-07 NOTE — ED Provider Notes (Signed)
CSN: [id number]     Arrival date & time 11/07/14  0219 History   First MD Initiated Contact with Patient 11/07/14 [phone]     Chief Complaint  Patient presents with  . Abdominal Pain     (Consider location/radiation/quality/duration/timing/severity/associated sxs/prior Treatment) Patient is a 74 y.o. male presenting with abdominal pain. The history is provided by the patient.  Abdominal Pain Pain location:  Suprapubic Pain quality: aching   Pain radiates to:  Does not radiate Pain severity:  No pain Onset quality:  Sudden Duration: 10 years. Timing:  Constant Progression:  Unable to specify Chronicity:  Chronic Context: not eating, not laxative use and not recent sexual activity   Relieved by:  Bowel activity Worsened by:  Nothing tried Associated symptoms: diarrhea, dysuria and shortness of breath   Associated symptoms: no anorexia, no constipation, no fever, no hematemesis, no hematochezia, no hematuria, no melena, no nausea and no sore throat   Diarrhea:    Quality:  Unable to specify   Past Medical History  Diagnosis Date  . Hypertension   . Cataract   . GERD (gastroesophageal reflux disease)   . Leukopenia   . Hiatal hernia   . BPH (benign prostatic hypertrophy)   . Barrett's esophagus without dysplasia 08/2006  . Internal hemorrhoids   . Adenomatous colon polyp 01/2009  . Fatty liver   . Hepatic hemangioma   . Anemia    Past Surgical History  Procedure Laterality Date  . Cataract extraction      Left eye   Family History  Problem Relation Age of Onset  . Diabetes Father   . Diabetes Brother   . Diabetes Sister    History  Substance Use Topics  . Smoking status: Former Smoker    Quit date: 02/16/1981  . Smokeless tobacco: Never Used  . Alcohol Use: Yes     Comment: Usually drinks 2-3 times a week. Last drink: tonight, Wine and Gin     Review of Systems  Constitutional: Negative for fever.  HENT: Negative for sore throat.   Respiratory: Positive for  shortness of breath.   Gastrointestinal: Positive for abdominal pain and diarrhea. Negative for nausea, constipation, melena, hematochezia, anorexia and hematemesis.  Genitourinary: Positive for dysuria. Negative for frequency and hematuria.      Allergies  Prilosec  Home Medications   Prior to Admission medications   Medication Sig Start Date End Date Taking? Authorizing Provider  amLODipine (NORVASC) 5 MG tablet Take 1 tablet (5 mg total) by mouth daily. 11/24/13  Yes Sharon Mt Street, MD  DEXILANT 60 MG capsule  06/12/14  Yes Historical Provider, MD  dicyclomine (BENTYL) 20 MG tablet Take 1 tablet (20 mg total) by mouth 4 (four) times daily as needed for spasms (abdominal cramps). 01/27/14  Yes Virgel Manifold, MD  MYRBETRIQ 50 MG TB24 tablet TAKE 1 TABLET (50 MG TOTAL) BY MOUTH DAILY. 04/17/14  Yes Sharon Mt Street, MD  terazosin (HYTRIN) 10 MG capsule TAKE 1 CAPSULE (10 MG TOTAL) BY MOUTH DAILY.   Yes Sharon Mt Street, MD   BP 103/68 mmHg  Pulse 72  Temp(Src) 97.5 F (36.4 C) (Oral)  Resp 20  Ht 6\' 2"  (1.88 m)  Wt 170 lb (77.111 kg)  BMI 21.82 kg/m2  SpO2 98% Physical Exam  Constitutional: He appears well-developed and well-nourished.  HENT:  Head: Normocephalic.  Eyes: Pupils are equal, round, and reactive to light.  Cardiovascular: Normal rate and regular rhythm.   Pulmonary/Chest: Effort normal.  Abdominal:  Soft. He exhibits no distension. There is no tenderness. Hernia confirmed negative in the right inguinal area and confirmed negative in the left inguinal area.  Genitourinary: Penis normal. Right testis shows no mass and no tenderness. Left testis shows no mass and no tenderness. No penile tenderness. No discharge found.  Patient is pointing to the underside of his scrotum same.  This is where his pain.  As there is no redness.  There is no abscess.  There is no swelling  Lymphadenopathy:       Right: No inguinal adenopathy present.       Left: No inguinal  adenopathy present.    ED Course  Procedures (including critical care time) Labs Review Labs Reviewed  CBC WITH DIFFERENTIAL - Abnormal; Notable for the following:    WBC 3.3 (*)    MCHC 36.2 (*)    Neutrophils Relative % 42 (*)    Neutro Abs 1.4 (*)    Lymphocytes Relative 51 (*)    All other components within normal limits  I-STAT CHEM 8, ED - Abnormal; Notable for the following:    Creatinine, Ser 1.50 (*)    Glucose, Bld 100 (*)    All other components within normal limits    Imaging Review Ct Abdomen Pelvis W Contrast  11/07/2014   CLINICAL DATA:  Lower abdominal pain and testicular pain tonight. Increases with movement. Diarrhea. History of Barrett's esophagus without dysplasia. GERD. Hypertension. Hemorrhoids. Hiatal hernia. Colon polyp. Fatty liver. Hepatic hemangioma.  EXAM: CT ABDOMEN AND PELVIS WITH CONTRAST  TECHNIQUE: Multidetector CT imaging of the abdomen and pelvis was performed using the standard protocol following bolus administration of intravenous contrast.  CONTRAST:  138mL OMNIPAQUE IOHEXOL 300 MG/ML  SOLN  COMPARISON:  12/20/2012  FINDINGS: Mild atelectasis and scarring in the right lung base. Focal bleb in the anterior right lung base appearing on small esophageal hiatal hernia.  Mild diffuse fatty infiltration of the liver. Focal peripherally enhancing lesion in the medial segment left lobe of the liver consistent with hemangioma. No change since prior study. Focal fatty infiltration adjacent the falciform ligament. The gallbladder, pancreas, spleen, adrenal glands, kidneys, abdominal aorta, inferior vena cava, and retroperitoneal lymph nodes are unremarkable. Small accessory spleens. No gastric wall thickening. No small bowel distention or wall thickening. Scattered stool in the colon. No colonic distention. No free air or free fluid in the abdomen.  Pelvis: Appendix is normal. No free or loculated pelvic fluid collections. No pelvic mass or lymphadenopathy. Bladder  wall is not thickened. The degenerative changes in the lumbar spine with Schmorl's nodes present. No destructive bone lesions.  IMPRESSION: No acute process demonstrated in the abdomen or pelvis. Small esophageal hiatal hernia. Fatty infiltration of the liver.   Electronically Signed   By: Lucienne Capers M.D.   On: 11/07/2014 05:36     EKG Interpretation None      MDM  Cc scan and labs reviewed.  No specific pathology cause for his vague abdominal pain has been identified today.  Patient will be referred back to his primary care physician for further evaluation is needed Final diagnoses:  Pain  Generalized abdominal pain         Garald Balding, NP 11/07/14 [place], MD 11/07/14 [phone]

## 2014-11-07 NOTE — ED Notes (Signed)
Patient transported to CT 

## 2014-11-07 NOTE — ED Notes (Signed)
Bed: YN18 Expected date:  Expected time:  Means of arrival:  Comments: Ems

## 2014-11-07 NOTE — ED Notes (Signed)
When entering the room, pt was resting quietly with eyes closed. Appears in no distress.

## 2014-11-07 NOTE — Discharge Instructions (Signed)
Abdominal Pain Many things can cause belly (abdominal) pain. Most times, the belly pain is not dangerous. Many cases of belly pain can be watched and treated at home. HOME CARE   Do not take medicines that help you go poop (laxatives) unless told to by your doctor.  Only take medicine as told by your doctor.  Eat or drink as told by your doctor. Your doctor will tell you if you should be on a special diet. GET HELP IF:  You do not know what is causing your belly pain.  You have belly pain while you are sick to your stomach (nauseous) or have runny poop (diarrhea).  You have pain while you pee or poop.  Your belly pain wakes you up at night.  You have belly pain that gets worse or better when you eat.  You have belly pain that gets worse when you eat fatty foods.  You have a fever. GET HELP RIGHT AWAY IF:   The pain does not go away within 2 hours.  You keep throwing up (vomiting).  The pain changes and is only in the right or left part of the belly.  You have bloody or tarry looking poop. MAKE SURE YOU:   Understand these instructions.  Will watch your condition.  Will get help right away if you are not doing well or get worse. Document Released: 04/07/2008 Document Revised: 10/25/2013 Document Reviewed: 06/29/2013 [facility] Patient Information 2015 [place], [place]. This information is not intended to replace advice given to you by your health care provider. Make sure you discuss any questions you have with your health care provider. Your CT Scan is normal please make an appointment withyour PCP for further evaluation

## 2014-11-13 ENCOUNTER — Telehealth: Payer: Self-pay | Admitting: Family Medicine

## 2014-11-13 NOTE — Telephone Encounter (Signed)
Pt is asking to speak with RN re: his PSA labs

## 2014-11-15 NOTE — Telephone Encounter (Signed)
Spoke with patient, very difficult to understand. [place] that he needs appointment with GI for a colonoscopy refusing to hear anything different. Informed patient Dr. Benson Norway declined to take him on as a patient. Scheduled him with [facility]. For 11/29/14 at 1pm with Dr. Dara Lords for consultation for abd. Pain, patient expressed understanding.

## 2014-12-01 ENCOUNTER — Ambulatory Visit: Payer: Medicare Other | Admitting: Podiatrist

## 2014-12-08 ENCOUNTER — Ambulatory Visit (INDEPENDENT_AMBULATORY_CARE_PROVIDER_SITE_OTHER): Payer: Medicare Other | Admitting: Podiatrist

## 2014-12-08 DIAGNOSIS — B351 Tinea unguium: Secondary | ICD-10-CM

## 2014-12-08 DIAGNOSIS — M79676 Pain in unspecified toe(s): Secondary | ICD-10-CM

## 2014-12-08 DIAGNOSIS — L84 Corns and callosities: Secondary | ICD-10-CM

## 2014-12-08 DIAGNOSIS — M216X9 Other acquired deformities of unspecified foot: Secondary | ICD-10-CM

## 2014-12-08 NOTE — Progress Notes (Signed)
HPI: Patient presents today for follow up of foot and nail care. Past medical history, meds, and allergies reviewed.  Objective: Neurovascular status unchanged with palpable pedal pulses and neurological sensation intact. Patients nails are elongated, thickened, discolored, dystrophic with ingrown deformity present. + hyperkeratotic and pre-ulcerative lesions present submet 5 bilateral. Porokeratotic appearance with intact integment post debridement noted  Assessment: symptomatic mycotic nails, prominent metatarsal bilateral, hyperkeratotic lesion x 2  Plan: Discussed treatment options and alternatives. Debrided nails without complication. Debrided hyperkeratotic lesions without complication. Return appointment recommended at routine intervals of 3 months.

## 2014-12-15 ENCOUNTER — Ambulatory Visit: Payer: Medicare Other | Admitting: Podiatrist

## 2014-12-25 ENCOUNTER — Telehealth: Payer: Self-pay | Admitting: Family Medicine

## 2014-12-25 NOTE — Telephone Encounter (Signed)
Pt called and would like Dr. Venetia Maxon to call in orders to have a colonoscopy. He said to call Dr. Bobby Rumpf and schedule that. Blima Rich

## 2014-12-26 NOTE — Telephone Encounter (Signed)
Called pt to clarify. He states that it is Dr. Griffin Dakin, not "Dr. Bobby Rumpf," and that I need to call them to "bypass the consultation" so he can "just get the colonoscopy. Explained to pt that I cannot order a colonoscopy and that he would need to talk to Dr. Dois Davenport office about this. He is adamant that "all [I] need to do is call them and set it up, a referral" so I told him I would call them and get back to him.  I called [facility] and discussed with one of their schedulers. Per her, pt has an appointment on 3/21 with Dr. Redmond Pulling already scheduled, but they do not do colonoscopies without consultation visits, first. I explained to her that I understood that and that I completely understand, but I did request that someone from their office call the patient to let him know what, if anything, he needs to do. She very graciously took a message that she will relay to the providers there at [place], and they will contact [name] directly.  Called pt back after that to clarify and relay the above to him. He became very upset, stating, "I keep talking to people and nobody knows anything, or they're just not telling me. Explained to pt that I have placed a referral already and that he has an appointment, and that the GI office is telling me that they will need a consultation visit first, and that a colonoscopy is not guaranteed (i.e., that he may not medically need one). He became very short with me and stated, "Okay, I will wait for them to call." He did express thanks for my making the above call, but as in the past, pt was very difficult during this conversation and is all but demanding a colonoscopy be set up despite Dr. Arnoldo Morale previously feeling that this was not necessary.  Please see also calls in Care Everywhere from [facility]; this has been a difficult situation, to say the least. Will plan to f/u with pt as needed but at this time will defer to [place] for how this situation will be handled.  Emmaline Kluver, MD PGY-3, [facility] 12/26/2014, 12:21 PM

## 2015-01-02 ENCOUNTER — Encounter: Payer: Self-pay | Admitting: Family Medicine

## 2015-01-02 ENCOUNTER — Ambulatory Visit (INDEPENDENT_AMBULATORY_CARE_PROVIDER_SITE_OTHER): Payer: Medicare Other | Admitting: Family Medicine

## 2015-01-02 VITALS — BP 129/82 | HR 72 | Temp 98.0°F | Wt 177.1 lb

## 2015-01-02 DIAGNOSIS — G8929 Other chronic pain: Secondary | ICD-10-CM

## 2015-01-02 DIAGNOSIS — R1084 Generalized abdominal pain: Secondary | ICD-10-CM

## 2015-01-02 NOTE — Patient Instructions (Signed)
Please see Dr. Fuller Plan for your abdominal pain to discuss this with him.  If you have any further questions, please don't hesitate to ask.  Thanks, Dr. Awanda Mink

## 2015-01-02 NOTE — Progress Notes (Signed)
Kenneth Roberts is a 74 y.o. male who presents today for abdominal pain.    Abdominal Pain - Pt states he has been having pain for about 1 year or so.  Insidious in onset at that time, has been going on intermittent since that time.  Located midline abdominal pain, not worse with activity or cough.  He presented to the ED on 11/07/14 with ongoing middle abdominal pain, where they did CT scan and blood work.  CT was benign appearing with no changes from previous.  CBC showed slight leukopenia with neutropenia but seems to be new.  Pt denies fever, chills, sweats, nausea, vomiting, diarrhea, abdominal pain in any quadrant, CVA TTP, LE edema, CP, SOB, nightsweats, wtloss, fatigue.  He has not had any melena, hematochezia.  Had colonoscopy in 2012 by Dr. Fuller Plan showing tubular adenoma, recessed, and recommend f/u in 5 years for repeat.    Past Medical History  Diagnosis Date  . Hypertension   . Cataract   . GERD (gastroesophageal reflux disease)   . Leukopenia   . Hiatal hernia   . BPH (benign prostatic hypertrophy)   . Barrett's esophagus without dysplasia 08/2006  . Internal hemorrhoids   . Adenomatous colon polyp 01/2009  . Fatty liver   . Hepatic hemangioma   . Anemia     History  Smoking status  . Former Smoker  . Quit date: 02/16/1981  Smokeless tobacco  . Never Used    Family History  Problem Relation Age of Onset  . Diabetes Father   . Diabetes Brother   . Diabetes Sister     Current Outpatient Prescriptions on File Prior to Visit  Medication Sig Dispense Refill  . amLODipine (NORVASC) 5 MG tablet Take 1 tablet (5 mg total) by mouth daily. 30 tablet 3  . DEXILANT 60 MG capsule     . dicyclomine (BENTYL) 20 MG tablet Take 1 tablet (20 mg total) by mouth 4 (four) times daily as needed for spasms (abdominal cramps). 20 tablet 0  . MYRBETRIQ 50 MG TB24 tablet TAKE 1 TABLET (50 MG TOTAL) BY MOUTH DAILY. 30 tablet 3  . terazosin (HYTRIN) 10 MG capsule TAKE 1 CAPSULE (10 MG TOTAL)  BY MOUTH DAILY. 30 capsule 1  . [DISCONTINUED] omeprazole (PRILOSEC) 40 MG capsule Take 1 capsule (40 mg total) by mouth daily. 30 capsule 3   No current facility-administered medications on file prior to visit.    ROS: Per HPI.  All other systems reviewed and are negative.   Physical Exam Filed Vitals:   01/02/15 0845  BP: 129/82  Pulse: 72  Temp: 98 F (36.7 C)    Physical Examination: General appearance - alert, well appearing, and in no distress Heart - normal rate and regular rhythm Abdomen - Soft/NT/ND, no CVA TTP, no palpable hernia midline or inguinal.  No HSM palpable     Chemistry      Component Value Date/Time   NA 142 11/07/2014 0407   K 3.7 11/07/2014 0407   CL 100 11/07/2014 0407   CO2 26 01/27/2014 0750   BUN 14 11/07/2014 0407   CREATININE 1.50* 11/07/2014 0407   CREATININE 0.96 02/03/2013 0958      Component Value Date/Time   CALCIUM 9.7 01/27/2014 0750   ALKPHOS 22* 01/03/2014 1346   AST 15 01/03/2014 1346   ALT 10 01/03/2014 1346   BILITOT 0.6 01/03/2014 1346

## 2015-01-02 NOTE — Assessment & Plan Note (Signed)
Pt with chronic generalized pain, has been evaluated by GI in the past with colonoscopy in 2012 - CT abdomen from 11/07/14 not showing acute process or change since last CT - Will refer back to Dr. Fuller Plan, as he has performed both UGI and colonoscopy  - Pt's biggest concern is this could be related to colon CA, however, colonoscopy from 2012 showed tubular adenoma which was removed and no other concern for CA at this time.  Referral is more of a reassurance for pt at this time.

## 2015-01-19 ENCOUNTER — Encounter: Payer: Self-pay | Admitting: Internal Medicine

## 2015-01-19 ENCOUNTER — Encounter: Payer: Self-pay | Admitting: *Deleted

## 2015-02-22 ENCOUNTER — Encounter: Payer: Self-pay | Admitting: Gastroenterology

## 2015-02-28 ENCOUNTER — Telehealth: Payer: Self-pay | Admitting: Family Medicine

## 2015-02-28 NOTE — Telephone Encounter (Signed)
Kenneth Roberts need a referral put in for [name] to have a screening colonoscopy.  Please inform patient when an appt has been scheduled

## 2015-03-01 NOTE — Telephone Encounter (Signed)
Very difficult patient, has been told multiple times that [facility] will not scheduled him (see previous phone notes/referrals). Patient currently being seen by [facility] and will have to follow up with them. Will send letter with this information. FYI to PCP.

## 2015-03-02 ENCOUNTER — Ambulatory Visit: Payer: Medicare Other

## 2015-03-02 ENCOUNTER — Ambulatory Visit (INDEPENDENT_AMBULATORY_CARE_PROVIDER_SITE_OTHER): Payer: Medicare Other | Admitting: Internal Medicine

## 2015-03-02 ENCOUNTER — Other Ambulatory Visit (INDEPENDENT_AMBULATORY_CARE_PROVIDER_SITE_OTHER): Payer: Medicare Other

## 2015-03-02 ENCOUNTER — Encounter: Payer: Self-pay | Admitting: Internal Medicine

## 2015-03-02 VITALS — BP 122/72 | HR 68 | Ht 69.5 in | Wt 174.5 lb

## 2015-03-02 DIAGNOSIS — R140 Abdominal distension (gaseous): Secondary | ICD-10-CM

## 2015-03-02 DIAGNOSIS — R634 Abnormal weight loss: Secondary | ICD-10-CM

## 2015-03-02 DIAGNOSIS — B351 Tinea unguium: Secondary | ICD-10-CM

## 2015-03-02 DIAGNOSIS — M79673 Pain in unspecified foot: Secondary | ICD-10-CM

## 2015-03-02 DIAGNOSIS — M79676 Pain in unspecified toe(s): Secondary | ICD-10-CM

## 2015-03-02 LAB — SEDIMENTATION RATE: Sed Rate: 12 mm/hr (ref 0–22)

## 2015-03-02 LAB — TSH: TSH: 1.1 u[IU]/mL (ref 0.35–4.50)

## 2015-03-02 MED ORDER — NA SULFATE-K SULFATE-MG SULF 17.5-3.13-1.6 GM/177ML PO SOLN: 1.0000 | Freq: Once | ORAL | Status: DC

## 2015-03-02 MED ORDER — LORAZEPAM 0.5 MG PO TABS: 0.5000 mg | ORAL_TABLET | Freq: Two times a day (BID) | ORAL | Status: DC

## 2015-03-02 MED ORDER — RANITIDINE HCL 150 MG PO TABS: 150.0000 mg | ORAL_TABLET | Freq: Every day | ORAL | Status: DC

## 2015-03-02 NOTE — Patient Instructions (Signed)
You have been scheduled for an endoscopy and colonoscopy. Please follow the written instructions given to you at your visit today. Please pick up your prep supplies at the pharmacy within the next 1-3 days. If you use inhalers (even only as needed), please bring them with you on the day of your procedure. Your physician has requested that you go to www.startemmi.com and enter the access code given to you at your visit today. This web site gives a general overview about your procedure. However, you should still follow specific instructions given to you by our office regarding your preparation for the procedure.  Go to the basement for labs Prescriptions sent to your pharmacy Ativan printed

## 2015-03-02 NOTE — Progress Notes (Signed)
Kenneth Roberts 1941/10/06 [id number]  Note: This dictation was prepared with Dragon digital system. Any transcriptional errors that result from this procedure are unintentional.   History of Present Illness: This is a 74 year old African-American male referred by Dr. Arville Care for evaluation of upper and lower abdominal pain. This gentleman has been seen by several gastroenterologists in the past and has two medical record numbers. He was seen in our office in 2007 for dyspepsia and had upper endoscopy by Dr. Fuller Plan with findings of 3 cm hiatal hernia. He was then seen by Dr. Michail Sermon in 2010 and underwent upper and lower endoscopy for similar symptoms of dyspepsia and bloating and was found to have 3 polyps, one of them was adenomatous. He is due for recall colonoscopy. Denies rectal bleeding but complains of  7 pound weight loss, mild constipation and tightness in his abdomen. There has been no nausea, vomiting or chest pain. He lives by himself. He feels anxious. He does not abuse alcohol, caffeine or smoking. CT scan of the abdomen in February 2014 in the emergency room showed fatty infiltration of the liver but no acute process.    Past Medical History  Diagnosis Date  . Heartburn   . GERD (gastroesophageal reflux disease)     No past surgical history on file.  No Known Allergies  Family history and social history have been reviewed.  Review of Systems:   The remainder of the 10 point ROS is negative except as outlined in the H&P  Physical Exam: General Appearance Well developed, in no distress Eyes  Non icteric  HEENT  Non traumatic, normocephalic  Mouth No lesion, tongue papillated, no cheilosis Neck Supple without adenopathy, thyroid not enlarged, no carotid bruits, no JVD Lungs Clear to auscultation bilaterally COR Normal S1, normal S2, regular rhythm, no murmur, quiet precordium Abdomen voluntary guarding. Normoactive bowel sounds. When he is distracted his abdomen is  soft and relaxed. He describes diffuse tenderness. Liver edge at costal margin. Rectal soft Hemoccult negative stool Extremities  No pedal edema Skin No lesions Neurological Alert and oriented x 3 Psychological Normal mood and affect, patient appears anxious  Assessment and Plan:   75 year old African-American male with chronic digestive problems previously evaluated in 2007 and again 2010. His symptoms are ongoing and according to him are progressive. There has been a recent weight loss of 8 pounds. He seemed to be very anxious. We will proceed with GI workup to rule out organic disease. We will start ranitidine 150 mg a day and Ativan 0.5 mg twice a day for anxiety and for  relaxation .Also check  sprue profile, sedimentation rate and TSH.  History of adenomatous polyp in 2010. We'll schedule colonoscopy for surveillance    Delfin Edis 03/02/2015

## 2015-03-05 ENCOUNTER — Other Ambulatory Visit: Payer: Self-pay | Admitting: *Deleted

## 2015-03-05 ENCOUNTER — Telehealth: Payer: Self-pay | Admitting: Internal Medicine

## 2015-03-05 LAB — GLIA (IGA/G) + TTG IGA
Gliadin IgA: 30 Units — ABNORMAL HIGH (ref <20)
Gliadin IgG: 20 Units — ABNORMAL HIGH (ref <20)
TISSUE TRANSGLUTAMINASE AB, IGA: 1 U/mL (ref <4)

## 2015-03-05 MED ORDER — NA SULFATE-K SULFATE-MG SULF 17.5-3.13-1.6 GM/177ML PO SOLN: 1.0000 | Freq: Once | ORAL | Status: DC

## 2015-03-05 NOTE — Telephone Encounter (Signed)
Spoke with patient and told him the lab test that Dr. Olevia Perches ordered.

## 2015-03-06 NOTE — Progress Notes (Signed)
HPI: Patient presents today for follow up of foot and nail care. Past medical history, meds, and allergies reviewed.  Objective: Neurovascular status unchanged with palpable pedal pulses and neurological sensation intact. Patients nails are elongated, thickened, discolored, dystrophic with ingrown deformity present. + hyperkeratotic and pre-ulcerative lesions present submet 5 bilateral. Porokeratotic appearance with intact integment post debridement noted  Assessment: symptomatic mycotic nails, prominent metatarsal bilateral, hyperkeratotic lesion x 2  Plan: Discussed treatment options and alternatives. Debrided nails without complication. Debrided hyperkeratotic lesions without complication. Return appointment recommended at routine intervals of 3 months.

## 2015-03-09 ENCOUNTER — Ambulatory Visit: Payer: Medicare Other

## 2015-03-28 ENCOUNTER — Encounter: Payer: Self-pay | Admitting: Internal Medicine

## 2015-03-28 ENCOUNTER — Ambulatory Visit (AMBULATORY_SURGERY_CENTER): Payer: Medicare Other | Admitting: Internal Medicine

## 2015-03-28 VITALS — BP 142/90 | HR 68 | Temp 96.4°F | Resp 16 | Ht 69.0 in | Wt 174.0 lb

## 2015-03-28 DIAGNOSIS — Z86010 Personal history of colonic polyps: Secondary | ICD-10-CM | POA: Diagnosis not present

## 2015-03-28 DIAGNOSIS — R140 Abdominal distension (gaseous): Secondary | ICD-10-CM | POA: Diagnosis present

## 2015-03-28 DIAGNOSIS — K900 Celiac disease: Secondary | ICD-10-CM

## 2015-03-28 DIAGNOSIS — K208 Other esophagitis: Secondary | ICD-10-CM

## 2015-03-28 DIAGNOSIS — R634 Abnormal weight loss: Secondary | ICD-10-CM

## 2015-03-28 LAB — HM COLONOSCOPY

## 2015-03-28 MED ORDER — SODIUM CHLORIDE 0.9 % IV SOLN: 500.0000 mL | INTRAVENOUS | Status: DC

## 2015-03-28 NOTE — Progress Notes (Signed)
Called to room to assist during endoscopic procedure.  Patient ID and intended procedure confirmed with present staff. Received instructions for my participation in the procedure from the performing physician.  

## 2015-03-28 NOTE — Op Note (Signed)
[facility]  [address] [place], [address]   COLONOSCOPY PROCEDURE REPORT  PATIENT: Kenneth Roberts, Kenneth Roberts  MR#: [id number] BIRTHDATE: 08-24-1941 , 58  yrs. old GENDER: male ENDOSCOPIST: Lafayette Dragon, MD REFERRED BY:DR [address], Dr Arville Care PROCEDURE DATE:  03/28/2015 PROCEDURE:   Colonoscopy, surveillance First Screening Colonoscopy - Avg.  risk and is 50 yrs.  old or older - No.  Prior Negative Screening - Now for repeat screening. N/A  History of Adenoma - Now for follow-up colonoscopy & has been > or = to 3 yrs.  Yes hx of adenoma.  Has been 3 or more years since last colonoscopy.  Polyps removed today? No Recommend repeat exam, <10 yrs? Yes poor prep ASA CLASS:   Class II INDICATIONS:Screening for colonic neoplasia, PH Colon Adenoma, and prior colonoscopy in 2010.  3 adenomatous polyps removed. MEDICATIONS: Monitored anesthesia care and Propofol 200 mg IV  DESCRIPTION OF PROCEDURE:   After the risks benefits and alternatives of the procedure were thoroughly explained, informed consent was obtained.  The digital rectal exam revealed no abnormalities of the rectum.   The LB [id number]  endoscope was introduced through the anus and advanced to the cecum, which was identified by both the appendix and ileocecal valve. No adverse events experienced.   The quality of the prep was poor.  (MoviPrep was used)  The instrument was then slowly withdrawn as the colon was fully examined. Estimated blood loss is zero unless otherwise noted in this procedure report.      COLON FINDINGS: A normal appearing cecum, ileocecal valve, and appendiceal orifice were identified.  The ascending, transverse, descending, sigmoid colon, and rectum appeared unremarkable. Retroflexed views revealed no abnormalities. The time to cecum = 14.10 Withdrawal time = 8.21   The scope was withdrawn and the procedure completed. COMPLICATIONS: There were no immediate  complications.  ENDOSCOPIC IMPRESSION: Normal colonoscopy Suboptimal prep.  RECOMMENDATIONS: Repeat colonoscopy in one year Double.  prep prior to next colonoscopy  eSigned:  Lafayette Dragon, MD 03/28/2015 4:22 PM   cc:

## 2015-03-28 NOTE — Progress Notes (Signed)
Pt and wife taken home per Adelene Amas RN.  They state they do not have much money and will have to call a cab to take home.

## 2015-03-28 NOTE — Op Note (Signed)
[facility]  [address]. [place], [address]   ENDOSCOPY PROCEDURE REPORT  PATIENT: Kenneth, Roberts  MR#: [id number] BIRTHDATE: 13-Nov-1940 , 72  yrs. old GENDER: male ENDOSCOPIST: Lafayette Dragon, MD REFERRED BY:  Dr Christa See, Dr Danella Maiers PROCEDURE DATE:  03/28/2015 PROCEDURE:  EGD w/ biopsy ASA CLASS:     Class II INDICATIONS:  Weight loss of 8 pounds.  Bloating.  Positive sprue markers.  Prior endoscopy in 2007 showed a 3 cm hiatal hernia. MEDICATIONS: Monitored anesthesia care and Propofol 200 mg IV TOPICAL ANESTHETIC: none  DESCRIPTION OF PROCEDURE: After the risks benefits and alternatives of the procedure were thoroughly explained, informed consent was obtained.  The LB [id number] endoscope was introduced through the mouth and advanced to the second portion of the duodenum , Without limitations.  The instrument was slowly withdrawn as the mucosa was fully examined.    Esophagus: esophageal mucosa appeared normal. There was no stricture or esophagitis. Squamocolumnar junction was slightly irregular  Stomach: gastric mucosa was normal. There was no evidence of gastritis. Retroflexion of the endoscope revealed normal fundus and cardia. Gastric outlet was normal  Duodenum: duodenal bulb and descending duodenum was normal. Biopsies were obtained from second portion duodenum to rule out sprue[ The scope was then withdrawn from the patient and the procedure completed.  COMPLICATIONS: There were no immediate complications.  ENDOSCOPIC IMPRESSION:  1. irregular Z line. Biopsies pending to rule out Barrett's esophagus 2. small 1 cm reducible hiatal hernia 3. Small bowel biopsies to rule out villous atrophy  RECOMMENDATIONS: Await pathology results  REPEAT EXAM: for EGD pending biopsy results.  eSigned:  Lafayette Dragon, MD 03/28/2015 4:16 PM    CC:  PATIENT NAME:  Kenneth, Roberts MR#: [id number]

## 2015-03-28 NOTE — Progress Notes (Signed)
To Pacu. Pt Alert and Oriented. Pleased with MAC. Report to RN

## 2015-03-28 NOTE — Patient Instructions (Signed)

## 2015-03-28 NOTE — Progress Notes (Signed)
Quick Note:  Patient is scheduled for both procedures today at 3:30 PM ______

## 2015-03-29 ENCOUNTER — Telehealth: Payer: Self-pay | Admitting: *Deleted

## 2015-03-29 NOTE — Telephone Encounter (Signed)
  Follow up Call-  Call back number 03/28/2015  Post procedure Call Back phone  # [phone]  Permission to leave phone message Yes     Patient questions:  Do you have a fever, pain , or abdominal swelling? No. Pain Score  0 *  Have you tolerated food without any problems? Yes.    Have you been able to return to your normal activities? Yes.    Do you have any questions about your discharge instructions: Diet   No. Medications  No. Follow up visit  No.  Do you have questions or concerns about your Care? No.  Actions: * If pain score is 4 or above: No action needed, pain <4.

## 2015-04-03 ENCOUNTER — Encounter: Payer: Self-pay | Admitting: Internal Medicine

## 2015-04-12 ENCOUNTER — Telehealth: Payer: Self-pay | Admitting: Internal Medicine

## 2015-04-12 NOTE — Telephone Encounter (Signed)
Left a message for patient to call back. 

## 2015-04-13 ENCOUNTER — Encounter: Payer: Self-pay | Admitting: Gastroenterology

## 2015-04-13 NOTE — Telephone Encounter (Signed)
Pt is returning your call

## 2015-04-16 NOTE — Telephone Encounter (Signed)
Spoke with patient and answered his questions. He wanted to go over results from his CT in January.

## 2015-04-30 ENCOUNTER — Telehealth: Payer: Self-pay | Admitting: Internal Medicine

## 2015-04-30 NOTE — Telephone Encounter (Signed)
Spoke with patient and told him that [facility] is the only group in [place].

## 2015-06-01 ENCOUNTER — Encounter: Payer: Self-pay | Admitting: Podiatry

## 2015-06-01 ENCOUNTER — Ambulatory Visit (INDEPENDENT_AMBULATORY_CARE_PROVIDER_SITE_OTHER): Payer: Medicare Other | Admitting: Podiatry

## 2015-06-01 DIAGNOSIS — L84 Corns and callosities: Secondary | ICD-10-CM | POA: Diagnosis not present

## 2015-06-01 DIAGNOSIS — B351 Tinea unguium: Secondary | ICD-10-CM

## 2015-06-01 DIAGNOSIS — M79676 Pain in unspecified toe(s): Secondary | ICD-10-CM | POA: Diagnosis not present

## 2015-06-01 NOTE — Progress Notes (Signed)
Subjective: 74 y.o. returns the office today for painful, elongated, thickened toenails which he is unable to trim himself. Denies any redness or drainage around the nails.Also he has painful calluses to the bottoms of his feet. Denies any redness or drainage.  Denies any acute changes since last appointment and no new complaints today. Denies any systemic complaints such as fevers, chills, nausea, vomiting.   Objective: AAO 3, NAD DP/PT pulses palpable, CRT less than 3 seconds Nails hypertrophic, dystrophic, elongated, brittle, discolored 10. There is tenderness overlying the nails 1-5 bilaterally. There is no surrounding erythema or drainage along the nail sites. Bilateral submetatarsal 5 hyperkeratotic lesions. Upon debridement no underlying ulceration, drainage or other signs of infection. No other open lesions or pre-ulcerative lesions are identified. No other areas of tenderness bilateral lower extremities. No overlying edema, erythema, increased warmth. No pain with calf compression, swelling, warmth, erythema.  Assessment: Patient presents with symptomatic onychomycosis; hyperkeratotic lesions  Plan: -Treatment options including alternatives, risks, complications were discussed -Nails sharply debrided 10 without complication/bleeding. Hyperkeratotic lesions debrided 2 without complication/bleeding.- -Discussed daily foot inspection. If there are any changes, to call the office immediately.  -Follow-up in 3 months or sooner if any problems are to arise. In the meantime, encouraged to call the office with any questions, concerns, changes symptoms.   Celesta Gentile, DPM

## 2015-06-13 ENCOUNTER — Telehealth: Payer: Self-pay | Admitting: Internal Medicine

## 2015-06-13 NOTE — Telephone Encounter (Signed)
Patient given recommendations. 

## 2015-06-13 NOTE — Telephone Encounter (Signed)
Spoke with patient and he is c/o of LUQ pain that usually occurs at night. He is taking Ativan BID and Ranitidine at hs.  Discussed anti reflux measures with patient. He wants to know if Dr. Olevia Perches thinks the Ativan could cause his pain.

## 2015-06-13 NOTE — Telephone Encounter (Signed)
OK to stop Ativan gradually. I put him on bid dose which should be reduced to qd for about 1 week, then stop. Ativan usually does not cause abdominal pain, it relaxes the stomach. And that is why I gave it to him.

## 2015-06-18 ENCOUNTER — Telehealth: Payer: Self-pay | Admitting: Family Medicine

## 2015-06-18 NOTE — Telephone Encounter (Signed)
Pt says he is due for a TSH test, wants to know if MD can put the orders in. Offered pt an appt to see pcp, pt declined and said he doesn't need to be seen, would like to just come have the blood work done.

## 2015-06-18 NOTE — Telephone Encounter (Signed)
From reviewing his chart I am not seeing any reason he should have a TSH done as he just had one that was normal in April. Please have the patient get an appt with me if he has any questions or concerns.  Thanks, Archie Patten, MD [facility] Resident  06/18/2015, 5:14 PM

## 2015-06-19 NOTE — Telephone Encounter (Signed)
LMOVM for pt to call us back. Gurjit Loconte, CMA  

## 2015-06-28 ENCOUNTER — Telehealth: Payer: Self-pay | Admitting: Family Medicine

## 2015-06-28 NOTE — Telephone Encounter (Signed)
Patient requesting to have PSA done.  Suppose to have this yearly.  Please put in order and inform pat to call and make an appt with lab.

## 2015-06-29 NOTE — Telephone Encounter (Signed)
Left message for patient to call back and schedule an appointment for a physical.

## 2015-06-29 NOTE — Telephone Encounter (Signed)
Patient due for a physical exam. Please have him make an appt and we can do it at that time (as it looks like he's had his PSA annually previously).    Thanks, Archie Patten, MD [facility] Resident  06/29/2015, 8:19 AM

## 2015-08-14 ENCOUNTER — Encounter: Payer: Self-pay | Admitting: Podiatry

## 2015-08-14 ENCOUNTER — Ambulatory Visit (INDEPENDENT_AMBULATORY_CARE_PROVIDER_SITE_OTHER): Payer: Medicare Other | Admitting: Podiatry

## 2015-08-14 DIAGNOSIS — Q828 Other specified congenital malformations of skin: Secondary | ICD-10-CM

## 2015-08-14 DIAGNOSIS — B351 Tinea unguium: Secondary | ICD-10-CM

## 2015-08-14 DIAGNOSIS — M79676 Pain in unspecified toe(s): Secondary | ICD-10-CM

## 2015-08-14 NOTE — Progress Notes (Signed)
Patient ID: Kenneth Roberts, male   DOB: [birth year], 74 y.o.   MRN: [id number] Complaint:  Visit Type: Patient returns to my office for continued preventative foot care services. Complaint: Patient states" my nails have grown long and thick and become painful to walk and wear shoes" Patient has been diagnosed with DM with no foot complications. The patient presents for preventative foot care services. No changes to ROS  Podiatric Exam: Vascular: dorsalis pedis and posterior tibial pulses are palpable bilateral. Capillary return is immediate. Temperature gradient is WNL. Skin turgor WNL  Sensorium: Normal Semmes Weinstein monofilament test. Normal tactile sensation bilaterally. Nail Exam: Pt has thick disfigured discolored nails with subungual debris noted bilateral entire nail hallux through fifth toenails Ulcer Exam: There is no evidence of ulcer or pre-ulcerative changes or infection. Orthopedic Exam: Muscle tone and strength are WNL. No limitations in general ROM. No crepitus or effusions noted. Foot type and digits show no abnormalities. Bony prominences are unremarkable. Skin: No Porokeratosis. No infection or ulcers.  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  Diagnosis:  Onychomycosis, , Pain in right toe, pain in left toes,  Porokeratosis  Treatment & Plan Procedures and Treatment: Consent by patient was obtained for treatment procedures. The patient understood the discussion of treatment and procedures well. All questions were answered thoroughly reviewed. Debridement of mycotic and hypertrophic toenails, 1 through 5 bilateral and clearing of subungual debris. No ulceration, no infection noted. Debride porokeratosis sub 5th met B/l Return Visit-Office Procedure: Patient instructed to return to the office for a follow up visit 3 months for continued evaluation and treatment.

## 2015-08-28 ENCOUNTER — Telehealth: Payer: Self-pay | Admitting: Internal Medicine

## 2015-08-28 NOTE — Telephone Encounter (Signed)
I have left message for the patient to call back 

## 2015-08-29 NOTE — Telephone Encounter (Signed)
Spoke with patient and he has been taking Pepto bismol. Told him this can cause black stools. Scheduled OV in January for patient to see Dr. Havery Moros to establish care.

## 2015-11-02 ENCOUNTER — Encounter: Payer: Self-pay | Admitting: Podiatry

## 2015-11-02 ENCOUNTER — Ambulatory Visit (INDEPENDENT_AMBULATORY_CARE_PROVIDER_SITE_OTHER): Payer: Medicare Other | Admitting: Podiatry

## 2015-11-02 DIAGNOSIS — B351 Tinea unguium: Secondary | ICD-10-CM | POA: Diagnosis not present

## 2015-11-02 DIAGNOSIS — Q828 Other specified congenital malformations of skin: Secondary | ICD-10-CM | POA: Diagnosis not present

## 2015-11-02 DIAGNOSIS — M79676 Pain in unspecified toe(s): Secondary | ICD-10-CM

## 2015-11-02 NOTE — Progress Notes (Signed)
Patient ID: Kenneth Roberts, male   DOB: 1941/01/20, 74 y.o.   MRN: [id number] Complaint:  Visit Type: Patient returns to my office for continued preventative foot care services. Complaint: Patient states" my nails have grown long and thick and become painful to walk and wear shoes" . The patient presents for preventative foot care services. No changes to ROS  Podiatric Exam: Vascular: dorsalis pedis and posterior tibial pulses are palpable bilateral. Capillary return is immediate. Temperature gradient is WNL. Skin turgor WNL  Sensorium: Normal Semmes Weinstein monofilament test. Normal tactile sensation bilaterally. Nail Exam: Pt has thick disfigured discolored nails with subungual debris noted bilateral entire nail hallux through fifth toenails Ulcer Exam: There is no evidence of ulcer or pre-ulcerative changes or infection. Orthopedic Exam: Muscle tone and strength are WNL. No limitations in general ROM. No crepitus or effusions noted. Foot type and digits show no abnormalities. Bony prominences are unremarkable. Skin: No Porokeratosis. No infection or ulcers.  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  Diagnosis:  Onychomycosis, , Pain in right toe, pain in left toes,  Porokeratosis  Treatment & Plan Procedures and Treatment: Consent by patient was obtained for treatment procedures. The patient understood the discussion of treatment and procedures well. All questions were answered thoroughly reviewed. Debridement of mycotic and hypertrophic toenails, 1 through 5 bilateral and clearing of subungual debris. No ulceration, no infection noted. Debride porokeratosis sub 5th met B/l Return Visit-Office Procedure: Patient instructed to return to the office for a follow up visit 10 weeks  for continued evaluation and treatment.  Gardiner Barefoot DPM

## 2015-11-07 ENCOUNTER — Ambulatory Visit (INDEPENDENT_AMBULATORY_CARE_PROVIDER_SITE_OTHER): Payer: Medicare Other | Admitting: Gastroenterology

## 2015-11-07 ENCOUNTER — Encounter: Payer: Self-pay | Admitting: Gastroenterology

## 2015-11-07 ENCOUNTER — Other Ambulatory Visit (INDEPENDENT_AMBULATORY_CARE_PROVIDER_SITE_OTHER): Payer: Medicare Other

## 2015-11-07 VITALS — BP 144/80 | HR 68 | Ht 69.5 in | Wt 170.1 lb

## 2015-11-07 DIAGNOSIS — Z86010 Personal history of colonic polyps: Secondary | ICD-10-CM

## 2015-11-07 DIAGNOSIS — R109 Unspecified abdominal pain: Secondary | ICD-10-CM

## 2015-11-07 DIAGNOSIS — F458 Other somatoform disorders: Secondary | ICD-10-CM | POA: Diagnosis not present

## 2015-11-07 DIAGNOSIS — R0989 Other specified symptoms and signs involving the circulatory and respiratory systems: Secondary | ICD-10-CM

## 2015-11-07 LAB — COMPREHENSIVE METABOLIC PANEL
ALK PHOS: 27 U/L — AB (ref 39–117)
ALT: 14 U/L (ref 0–53)
AST: 23 U/L (ref 0–37)
Albumin: 4.6 g/dL (ref 3.5–5.2)
BUN: 10 mg/dL (ref 6–23)
CO2: 31 mEq/L (ref 19–32)
Calcium: 9.7 mg/dL (ref 8.4–10.5)
Chloride: 104 mEq/L (ref 96–112)
Creatinine, Ser: 0.95 mg/dL (ref 0.40–1.50)
GFR: 99.96 mL/min (ref >60.00)
GLUCOSE: 90 mg/dL (ref 70–99)
Potassium: 4.1 mEq/L (ref 3.5–5.1)
Sodium: 142 mEq/L (ref 135–145)
TOTAL PROTEIN: 7.6 g/dL (ref 6.0–8.3)
Total Bilirubin: 0.9 mg/dL (ref 0.2–1.2)

## 2015-11-07 LAB — CBC WITH DIFFERENTIAL/PLATELET
Basophils Absolute: 0 10*3/uL (ref 0.0–0.1)
Basophils Relative: 0.8 % (ref 0.0–3.0)
EOS PCT: 0.3 % (ref 0.0–5.0)
Eosinophils Absolute: 0 10*3/uL (ref 0.0–0.7)
HEMATOCRIT: 42.6 % (ref 39.0–52.0)
Hemoglobin: 14.4 g/dL (ref 13.0–17.0)
LYMPHS ABS: 1.6 10*3/uL (ref 0.7–4.0)
Lymphocytes Relative: 34.3 % (ref 12.0–46.0)
MCHC: 33.7 g/dL (ref 30.0–36.0)
MCV: 94.2 fl (ref 78.0–100.0)
MONO ABS: 0.3 10*3/uL (ref 0.1–1.0)
Monocytes Relative: 6.4 % (ref 3.0–12.0)
NEUTROS PCT: 58.2 % (ref 43.0–77.0)
Neutro Abs: 2.8 10*3/uL (ref 1.4–7.7)
Platelets: 242 10*3/uL (ref 150.0–400.0)
RBC: 4.52 Mil/uL (ref 4.22–5.81)
RDW: 15.1 % (ref 11.5–15.5)
WBC: 4.8 10*3/uL (ref 4.0–10.5)

## 2015-11-07 NOTE — Patient Instructions (Signed)
Your physician has requested that you go to the basement for lab work before leaving today.  You have been scheduled for a colonoscopy. Please follow written instructions given to you at your visit today.  Please pick up your prep supplies at the pharmacy within the next 1-3 days. If you use inhalers (even only as needed), please bring them with you on the day of your procedure. Your physician has requested that you go to www.startemmi.com and enter the access code given to you at your visit today. This web site gives a general overview about your procedure. However, you should still follow specific instructions given to you by our office regarding your preparation for the procedure.   We will call you about a referral to ENT.

## 2015-11-07 NOTE — Progress Notes (Signed)
HPI :  75 y/o male here for follow up of chronic GI symptoms. He has previously been seen by Dr. Fuller Plan and Dr. Olevia Perches over the years.  He reports a sense of globus which has been ongoing for a while. He has some occasional heartburn, which he thinks certain foods can trigger his symptoms. He has omeprazole '40mg'$ , which he takes every other day or so. He thinks it does a decent job controlling his symptoms. He says he has taken it daily without significant improvement in globus and prefers to take it PRN. No dysphagia. No odynophagia. He thinks his weight is stable around 175lbs or so. EGD done in May with irregular zline but normal biopsies. He reports prior tobacco use but not for years. He does not think he has ever had a prior laryngoscopy for this issue.   He otherwise reports ongoing discomfort in his upper and lower abdomen which has been present for years. He reports it comes and goes. Not much gas and bloating. He has 2-3 BMs per day, no diarrhea or constipation routinely, rare constipation. No blood in the stools. He thinks the pain has been going on for years without interval changing. Sometimes eating can make his upper abdominal symptoms worse. He denies NSAID use. He has had prior EGD, colonoscopy, capsule study and multiple CTs without a clear etiology and reports "no one has been able to figure this out".  EGD 5/16 - irregular z-line, small hiatal hernia, normal stomach and duodenum - biopsies normal, no BE or celiac Colonoscopy May 2016 - poor prep Colonoscopy 2010 - adenoma     Past Medical History  Diagnosis Date  . Hypertension   . Cataract   . Leukopenia   . Hiatal hernia   . BPH (benign prostatic hypertrophy)   . Barrett's esophagus without dysplasia 08/2006  . Internal hemorrhoids   . Adenomatous colon polyp 01/2009  . Fatty liver   . Hepatic hemangioma   . Anemia   . Heartburn   . GERD (gastroesophageal reflux disease)   . Chronic headaches   . Colon polyps       Past Surgical History  Procedure Laterality Date  . Cataract extraction      Left eye  . Colonoscopy     Family History  Problem Relation Age of Onset  . Stomach cancer Mother   . Colon cancer Neg Hx   . Colon polyps Neg Hx   . Diabetes Father   . Diabetes Brother   . Diabetes Sister   . Esophageal cancer Neg Hx    Social History  Substance Use Topics  . Smoking status: Former Smoker    Quit date: 02/16/1981  . Smokeless tobacco: Never Used  . Alcohol Use: No     Comment: Usually drinks 2-3 times a week. Last drink: tonight, Wine and Gin    Current Outpatient Prescriptions  Medication Sig Dispense Refill  . amLODipine (NORVASC) 5 MG tablet Take 1 tablet (5 mg total) by mouth daily. 30 tablet 3  . [DISCONTINUED] omeprazole (PRILOSEC) 40 MG capsule Take 1 capsule (40 mg total) by mouth daily. 30 capsule 3   No current facility-administered medications for this visit.   Allergies  Allergen Reactions  . Prilosec [Omeprazole] Itching     Review of Systems: All systems reviewed and negative except where noted in HPI.   Lab Results  Component Value Date   WBC 4.8 11/07/2015   HGB 14.4 11/07/2015   HCT 42.6 11/07/2015  MCV 94.2 11/07/2015   PLT 242.0 11/07/2015    Lab Results  Component Value Date   CREATININE 0.95 11/07/2015   BUN 10 11/07/2015   NA 142 11/07/2015   K 4.1 11/07/2015   CL 104 11/07/2015   CO2 31 11/07/2015    Lab Results  Component Value Date   ALT 14 11/07/2015   AST 23 11/07/2015   ALKPHOS 27* 11/07/2015   BILITOT 0.9 11/07/2015   Prior TTG IgA negative but positive anti-gliadin ABs  Physical Exam: BP 144/80 mmHg  Pulse 68  Ht 5' 9.5" (1.765 m)  Wt 170 lb 2 oz (77.168 kg)  BMI 24.77 kg/m2 Constitutional: Pleasant,well-developed, male in no acute distress. HEENT: Normocephalic and atraumatic. Conjunctivae are normal. No scleral icterus. Neck supple.  Cardiovascular: Normal rate, regular rhythm.  Pulmonary/chest: Effort  normal and breath sounds normal. No wheezing, rales or rhonchi. Abdominal: Soft, nondistended, nontender. Bowel sounds active throughout. There are no masses palpable. No hepatomegaly. Extremities: no edema Lymphadenopathy: No cervical adenopathy noted. Neurological: Alert and oriented to person place and time. Skin: Skin is warm and dry. No rashes noted. Psychiatric: Normal mood and affect. Behavior is normal.   ASSESSMENT AND PLAN: 75 y/o male presenting with chronic intermittent upper and lower abdominal pains, as well as globus.   Regarding his abdominal pains, he has had prior EGD, colonsocopy, capsule, and multiple CTs without a clear etiology. He had a negative TTG IgA but positive anti-gliadin AB, with negative small bowel biopsies. I highly doubt he has celiac disease but offered him HLA testing to more definitively rule this out as he had questions about this possibility. Otherwise, prior capsule endoscopy in 2012 showed some nonspecific findings however he has since had negative CT imaging which has not revealed any pathology of the small bowel. I suspect he more than likely has functional symptoms at this time. I offered him a trial of FD gard and gave him a coupon for this to see if it helps some of his postprandial upper tract symptoms. He is otherwise due for a repeat surveillance colonoscopy for history of adenomatous polyps in 2010 with his last colonoscopy being limited by poor prep. I offered him to have this done now given his symptoms but think it unlikely to reveal a source to explain his symptoms. Finally, he has had ongoing globus with negative EGD to cause this finding. Perhaps related to GERD/NERD however trials of higher dose PPI have not provided benefit. I offered him an evaluation to see ENT for laryngoscopy and he wished to proceed.   The indications, risks, and benefits of colonoscopy were explained to the patient in detail. Risks include but are not limited to bleeding,  perforation, adverse reaction to medications, and cardiopulmonary compromise. Sequelae include but are not limited to the possibility of surgery, hospitalization, and mortality. The patient verbalized understanding and wished to proceed. All questions answered, referred to the scheduler and bowel prep ordered. Further recommendations pending results of the exam.   Iredell Cellar, MD [facility] Pager [phone]

## 2015-11-08 ENCOUNTER — Other Ambulatory Visit: Payer: Self-pay

## 2015-11-08 DIAGNOSIS — R109 Unspecified abdominal pain: Secondary | ICD-10-CM

## 2015-11-08 DIAGNOSIS — Z86010 Personal history of colonic polyps: Secondary | ICD-10-CM

## 2015-11-08 MED ORDER — NA SULFATE-K SULFATE-MG SULF 17.5-3.13-1.6 GM/177ML PO SOLN: ORAL | Status: DC

## 2015-11-13 ENCOUNTER — Telehealth: Payer: Self-pay | Admitting: Gastroenterology

## 2015-11-13 ENCOUNTER — Other Ambulatory Visit: Payer: Self-pay

## 2015-11-13 DIAGNOSIS — Z86010 Personal history of colonic polyps: Secondary | ICD-10-CM

## 2015-11-13 DIAGNOSIS — R109 Unspecified abdominal pain: Secondary | ICD-10-CM

## 2015-11-13 MED ORDER — NA SULFATE-K SULFATE-MG SULF 17.5-3.13-1.6 GM/177ML PO SOLN: ORAL | Status: DC

## 2015-11-13 NOTE — Telephone Encounter (Signed)
Called pt and informed him that he can still use the discount card and that we have not received any with a new expiration date. Pt understands.

## 2015-11-14 ENCOUNTER — Telehealth: Payer: Self-pay | Admitting: Gastroenterology

## 2015-11-14 NOTE — Telephone Encounter (Signed)
Pt was called and informed his ENT appointment with Dr Benjamine Mola on 12/03/2015 at 1:30pm. Pt understands.

## 2015-11-26 ENCOUNTER — Telehealth: Payer: Self-pay | Admitting: Family Medicine

## 2015-11-26 NOTE — Telephone Encounter (Signed)
Pt is needing a new provider and you were recommended. Please advise

## 2015-11-27 ENCOUNTER — Ambulatory Visit: Payer: Medicare Other | Admitting: Podiatry

## 2015-11-30 ENCOUNTER — Encounter: Payer: Medicare Other | Admitting: Gastroenterology

## 2015-11-30 NOTE — Telephone Encounter (Signed)
Ok with me 

## 2015-12-03 NOTE — Telephone Encounter (Signed)
appt scheduled

## 2015-12-04 ENCOUNTER — Other Ambulatory Visit (INDEPENDENT_AMBULATORY_CARE_PROVIDER_SITE_OTHER): Payer: Self-pay | Admitting: Otolaryngology

## 2015-12-04 DIAGNOSIS — R1310 Dysphagia, unspecified: Secondary | ICD-10-CM

## 2015-12-10 ENCOUNTER — Ambulatory Visit (HOSPITAL_COMMUNITY): Admission: RE | Admit: 2015-12-10 | Payer: Medicare Other | Source: Ambulatory Visit

## 2015-12-17 ENCOUNTER — Telehealth: Payer: Self-pay | Admitting: Gastroenterology

## 2015-12-17 MED ORDER — NA SULFATE-K SULFATE-MG SULF 17.5-3.13-1.6 GM/177ML PO SOLN: ORAL | Status: DC

## 2015-12-17 NOTE — Telephone Encounter (Signed)
Sent prep to pt's pharmacy. Tried calling both numbers listed in patient's chart. Both numbers are disconnected.

## 2015-12-20 ENCOUNTER — Other Ambulatory Visit (INDEPENDENT_AMBULATORY_CARE_PROVIDER_SITE_OTHER): Payer: Medicare Other

## 2015-12-20 ENCOUNTER — Ambulatory Visit (INDEPENDENT_AMBULATORY_CARE_PROVIDER_SITE_OTHER): Payer: Medicare Other | Admitting: Internal Medicine

## 2015-12-20 ENCOUNTER — Encounter: Payer: Self-pay | Admitting: Internal Medicine

## 2015-12-20 VITALS — BP 140/90 | HR 65 | Temp 98.1°F | Resp 16 | Ht 69.5 in | Wt 170.0 lb

## 2015-12-20 DIAGNOSIS — E785 Hyperlipidemia, unspecified: Secondary | ICD-10-CM

## 2015-12-20 DIAGNOSIS — R072 Precordial pain: Secondary | ICD-10-CM

## 2015-12-20 DIAGNOSIS — N400 Enlarged prostate without lower urinary tract symptoms: Secondary | ICD-10-CM | POA: Diagnosis not present

## 2015-12-20 DIAGNOSIS — K2100 Gastro-esophageal reflux disease with esophagitis, without bleeding: Secondary | ICD-10-CM

## 2015-12-20 DIAGNOSIS — Z23 Encounter for immunization: Secondary | ICD-10-CM | POA: Diagnosis not present

## 2015-12-20 DIAGNOSIS — I10 Essential (primary) hypertension: Secondary | ICD-10-CM

## 2015-12-20 DIAGNOSIS — Z0001 Encounter for general adult medical examination with abnormal findings: Secondary | ICD-10-CM | POA: Diagnosis not present

## 2015-12-20 DIAGNOSIS — Z0000 Encounter for general adult medical examination without abnormal findings: Secondary | ICD-10-CM

## 2015-12-20 LAB — FECAL OCCULT BLOOD, GUAIAC: Fecal Occult Blood: NEGATIVE

## 2015-12-20 LAB — COMPREHENSIVE METABOLIC PANEL
ALT: 9 U/L (ref 0–53)
AST: 12 U/L (ref 0–37)
Albumin: 4.5 g/dL (ref 3.5–5.2)
Alkaline Phosphatase: 23 U/L — ABNORMAL LOW (ref 39–117)
BILIRUBIN TOTAL: 0.8 mg/dL (ref 0.2–1.2)
BUN: 12 mg/dL (ref 6–23)
CALCIUM: 9.3 mg/dL (ref 8.4–10.5)
CO2: 29 meq/L (ref 19–32)
Chloride: 104 mEq/L (ref 96–112)
Creatinine, Ser: 0.91 mg/dL (ref 0.40–1.50)
GFR: 105.01 mL/min (ref >60.00)
Glucose, Bld: 103 mg/dL — ABNORMAL HIGH (ref 70–99)
POTASSIUM: 4 meq/L (ref 3.5–5.1)
Sodium: 140 mEq/L (ref 135–145)
Total Protein: 7.1 g/dL (ref 6.0–8.3)

## 2015-12-20 LAB — TSH: TSH: 1.06 u[IU]/mL (ref 0.35–4.50)

## 2015-12-20 LAB — CBC WITH DIFFERENTIAL/PLATELET
BASOS ABS: 0 10*3/uL (ref 0.0–0.1)
Basophils Relative: 0.8 % (ref 0.0–3.0)
Eosinophils Absolute: 0 10*3/uL (ref 0.0–0.7)
Eosinophils Relative: 0.2 % (ref 0.0–5.0)
HEMATOCRIT: 40.6 % (ref 39.0–52.0)
Hemoglobin: 14 g/dL (ref 13.0–17.0)
LYMPHS PCT: 30.6 % (ref 12.0–46.0)
Lymphs Abs: 1.1 10*3/uL (ref 0.7–4.0)
MCHC: 34.5 g/dL (ref 30.0–36.0)
MCV: 92.2 fl (ref 78.0–100.0)
MONOS PCT: 12 % (ref 3.0–12.0)
Monocytes Absolute: 0.4 10*3/uL (ref 0.1–1.0)
NEUTROS ABS: 2 10*3/uL (ref 1.4–7.7)
Neutrophils Relative %: 56.4 % (ref 43.0–77.0)
PLATELETS: 116 10*3/uL — AB (ref 150.0–400.0)
RBC: 4.4 Mil/uL (ref 4.22–5.81)
RDW: 14.6 % (ref 11.5–15.5)
WBC: 3.5 10*3/uL — ABNORMAL LOW (ref 4.0–10.5)

## 2015-12-20 LAB — LIPID PANEL
CHOL/HDL RATIO: 3
Cholesterol: 175 mg/dL (ref 0–200)
HDL: 57.6 mg/dL (ref >39.00)
LDL Cholesterol: 109 mg/dL — ABNORMAL HIGH (ref 0–99)
NONHDL: 117.71
Triglycerides: 43 mg/dL (ref 0.0–149.0)
VLDL: 8.6 mg/dL (ref 0.0–40.0)

## 2015-12-20 LAB — URINALYSIS, ROUTINE W REFLEX MICROSCOPIC
Bilirubin Urine: NEGATIVE
Hgb urine dipstick: NEGATIVE
Ketones, ur: NEGATIVE
Leukocytes, UA: NEGATIVE
Nitrite: NEGATIVE
PH: 6.5 (ref 5.0–8.0)
RBC / HPF: NONE SEEN (ref 0–?)
Specific Gravity, Urine: 1.015 (ref 1.000–1.030)
TOTAL PROTEIN, URINE-UPE24: NEGATIVE
Urine Glucose: NEGATIVE
Urobilinogen, UA: 0.2 (ref 0.0–1.0)
WBC, UA: NONE SEEN (ref 0–?)

## 2015-12-20 LAB — PSA: PSA: 0.45 ng/mL (ref 0.10–4.00)

## 2015-12-20 NOTE — Progress Notes (Addendum)
Subjective:  Patient ID: Kenneth Roberts, male    DOB: 1941/10/17  Age: 75 y.o. MRN: [id number]  CC: Annual Exam and Gastroesophageal Reflux   HPI Broox Sera presents for a complete physical.  He is currently seeing ENT and gastroenterology regarding gastroesophageal reflux disease with some atypical upper GI symptoms. He is getting some relief with an H2 blocker. He complains of heartburn and throat clearing. He denies odynophagia or dysphagia.   He has a history of hypertension and tells me that his blood pressures been well controlled with amlodipine. On review of systems he complains of an intermittent episode of stabbing chest pain on the left side of his anterior chest wall that occurs while he is sitting. He denies diaphoresis, shortness of breath, or dyspnea on exertion.  History Canuto has a past medical history of Hypertension; Cataract; Leukopenia; Hiatal hernia; BPH (benign prostatic hypertrophy); Barrett's esophagus without dysplasia (08/2006); Internal hemorrhoids; Adenomatous colon polyp (01/2009); Fatty liver; Hepatic hemangioma; Anemia; Heartburn; GERD (gastroesophageal reflux disease); Chronic headaches; and Colon polyps.   He has past surgical history that includes Cataract extraction and Colonoscopy.   His family history includes Diabetes in his brother, father, and sister; Stomach cancer in his mother. There is no history of Colon cancer, Colon polyps, or Esophageal cancer.He reports that he quit smoking about 34 years ago. He has never used smokeless tobacco. He reports that he does not drink alcohol or use illicit drugs.  Outpatient Prescriptions Prior to Visit  Medication Sig Dispense Refill  . amLODipine (NORVASC) 5 MG tablet Take 1 tablet (5 mg total) by mouth daily. 30 tablet 3  . Na Sulfate-K Sulfate-Mg Sulf SOLN Take as directed per Colonoscopy. 354 mL 0  . Na Sulfate-K Sulfate-Mg Sulf SOLN Take as directed per Colonoscopy. 354 mL 0   No  facility-administered medications prior to visit.    ROS Review of Systems  Constitutional: Negative.  Negative for fever, chills, diaphoresis, appetite change and fatigue.  HENT: Negative.  Negative for congestion, facial swelling, sinus pressure, sore throat, trouble swallowing and voice change.   Eyes: Negative.   Respiratory: Negative.  Negative for cough, choking, chest tightness, shortness of breath and stridor.   Cardiovascular: Positive for chest pain. Negative for palpitations and leg swelling.  Gastrointestinal: Negative.  Negative for nausea, abdominal pain, diarrhea, constipation and blood in stool.  Endocrine: Negative.   Genitourinary: Negative.  Negative for dysuria, urgency, hematuria, penile swelling, difficulty urinating, penile pain and testicular pain.  Musculoskeletal: Negative.  Negative for myalgias, back pain and arthralgias.  Skin: Negative.   Allergic/Immunologic: Negative.   Neurological: Negative.  Negative for dizziness, tremors, speech difficulty, weakness and light-headedness.  Hematological: Negative for adenopathy. Does not bruise/bleed easily.  Psychiatric/Behavioral: Negative.     Objective:  BP 140/90 mmHg  Pulse 65  Temp(Src) 98.1 F (36.7 C) (Oral)  Resp 16  Ht 5' 9.5" (1.765 m)  Wt 170 lb (77.111 kg)  BMI 24.75 kg/m2  SpO2 99%  Physical Exam  Constitutional: He is oriented to person, place, and time. He appears well-developed and well-nourished.  Non-toxic appearance. He does not have a sickly appearance. He does not appear ill. No distress.  HENT:  Head: Normocephalic and atraumatic.  Mouth/Throat: Oropharynx is clear and moist. No oropharyngeal exudate.  Eyes: Conjunctivae are normal. Right eye exhibits no discharge. Left eye exhibits no discharge. No scleral icterus.  Neck: Normal range of motion. Neck supple. No JVD present. No tracheal deviation present. No thyromegaly present.  Cardiovascular:  Normal rate, regular rhythm, normal  heart sounds and intact distal pulses.  Exam reveals no gallop and no friction rub.   No murmur heard. Pulses:      Carotid pulses are 1+ on the right side, and 1+ on the left side.      Radial pulses are 1+ on the right side, and 1+ on the left side.       Femoral pulses are 1+ on the right side, and 1+ on the left side.      Popliteal pulses are 1+ on the right side, and 1+ on the left side.       Dorsalis pedis pulses are 1+ on the right side, and 1+ on the left side.       Posterior tibial pulses are 1+ on the right side, and 1+ on the left side.  EKG - NSR, no Q waves, no ST/T wave changes, no LVH  Normal EKG  Pulmonary/Chest: Effort normal and breath sounds normal. No stridor. No respiratory distress. He has no wheezes. He has no rales. He exhibits no tenderness.  Abdominal: Soft. Bowel sounds are normal. He exhibits no distension and no mass. There is no tenderness. There is no rebound and no guarding. Hernia confirmed negative in the right inguinal area and confirmed negative in the left inguinal area.  Genitourinary: Testes normal and penis normal. Rectal exam shows external hemorrhoid. Rectal exam shows no internal hemorrhoid, no fissure, no mass, no tenderness and anal tone normal. Guaiac negative stool. Prostate is enlarged (1+ smooth symm BPH). Prostate is not tender. Right testis shows no mass, no swelling and no tenderness. Right testis is descended. Left testis shows no mass and no tenderness. Left testis is descended. Circumcised. No penile erythema or penile tenderness. No discharge found.  Musculoskeletal: Normal range of motion. He exhibits no edema or tenderness.  Lymphadenopathy:    He has no cervical adenopathy.       Right: No inguinal adenopathy present.       Left: No inguinal adenopathy present.  Neurological: He is oriented to person, place, and time.  Skin: Skin is warm and dry. No rash noted. He is not diaphoretic. No erythema. No pallor.  Psychiatric: He has a  normal mood and affect. His behavior is normal. Judgment and thought content normal.  Vitals reviewed.   Lab Results  Component Value Date   WBC 3.5* 12/20/2015   HGB 14.0 12/20/2015   HCT 40.6 12/20/2015   PLT 116.0* 12/20/2015   GLUCOSE 103* 12/20/2015   CHOL 175 12/20/2015   TRIG 43.0 12/20/2015   HDL 57.60 12/20/2015   LDLDIRECT 85 04/14/2012   LDLCALC 109* 12/20/2015   ALT 9 12/20/2015   AST 12 12/20/2015   NA 140 12/20/2015   K 4.0 12/20/2015   CL 104 12/20/2015   CREATININE 0.91 12/20/2015   BUN 12 12/20/2015   CO2 29 12/20/2015   TSH 1.06 12/20/2015   PSA 0.45 12/20/2015   INR 0.91 05/06/2012    Assessment & Plan:   Tyse was seen today for annual exam and gastroesophageal reflux.  Diagnoses and all orders for this visit:  Essential hypertension- his blood pressure is well-controlled, electrolytes and renal function are stable. -     Comprehensive metabolic panel; Future  Gastroesophageal reflux disease with esophagitis- he has no atypical symptoms or signs of blood loss, he will continue the H2 blocker since he has contraindications to a proton pump inhibitor. He will continue to follow with GI  and ENT. -     CBC with Differential/Platelet; Future  BPH (benign prostatic hyperplasia)- his exam and PSA are not suspicious for prostate cancer, he has no symptoms that need to be treated. -     PSA; Future -     Urinalysis, Routine w reflex microscopic (not at [facility]); Future  HLD (hyperlipidemia)- his Framingham risk score is 16% but he is not willing to take a statin. -     Comprehensive metabolic panel; Future -     Lipid panel; Future -     TSH; Future  Precordial chest pain- his chest pain is not consistent with angina. His EKG is normal. He does not have significant risk factors for CHF or coronary artery disease. This is most likely musculoskeletal pain and will just follow for now. -     EKG 12-Lead  Need for vaccination with 13-polyvalent pneumococcal  conjugate vaccine -     Pneumococcal conjugate vaccine 13-valent  Need for Tdap vaccination -     Tdap vaccine greater than or equal to 7yo IM   I have discontinued Mr. Geesey's Na Sulfate-K Sulfate-Mg Sulf and Na Sulfate-K Sulfate-Mg Sulf. I am also having him maintain his amLODipine, omeprazole, ranitidine, and SUPREP BOWEL PREP.  Meds ordered this encounter  Medications  . omeprazole (PRILOSEC) 20 MG capsule    Sig:   . ranitidine (ZANTAC) 150 MG tablet    Sig: Take 150 mg by mouth 2 (two) times daily.  Manus Gunning BOWEL PREP SOLN    Sig:      Follow-up: Return in about 3 months (around 03/18/2016).  Scarlette Calico, MD

## 2015-12-20 NOTE — Patient Instructions (Signed)

## 2015-12-20 NOTE — Progress Notes (Signed)
Pre visit review using our clinic review tool, if applicable. No additional management support is needed unless otherwise documented below in the visit note. 

## 2015-12-21 ENCOUNTER — Encounter: Payer: Self-pay | Admitting: Internal Medicine

## 2015-12-21 ENCOUNTER — Telehealth: Payer: Self-pay | Admitting: Internal Medicine

## 2015-12-21 NOTE — Telephone Encounter (Signed)
Done

## 2015-12-21 NOTE — Telephone Encounter (Signed)
States he gave transportation letter to be signed and faxed in to Dr. Ronnald Ramp assistant yesterday.  Patient states this form also has to be mailed in to [employer]  Patient does not know address.  States that the address should be on the form.

## 2015-12-22 DIAGNOSIS — Z0000 Encounter for general adult medical examination without abnormal findings: Secondary | ICD-10-CM | POA: Insufficient documentation

## 2015-12-22 NOTE — Addendum Note (Signed)
Addended by: Janith Lima on: 12/22/2015 05:26 PM   Modules accepted: Miquel Dunn

## 2015-12-28 ENCOUNTER — Telehealth: Payer: Self-pay | Admitting: *Deleted

## 2015-12-28 MED ORDER — MIRABEGRON ER 50 MG PO TB24: ORAL_TABLET | ORAL | Status: DC

## 2015-12-28 NOTE — Telephone Encounter (Signed)
Notified pt rx has been sent to [facility].../lmb 

## 2015-12-28 NOTE — Telephone Encounter (Signed)
Left msg on triage needing refill on his medication. Called pt back no answer LMOM need to know name of meds he is needing. Pls call w/info...Kenneth Roberts

## 2015-12-28 NOTE — Telephone Encounter (Signed)
Pt is wanting [place] to call in Myrbetriq.  He is not sure if ins cover it or not.

## 2016-01-02 ENCOUNTER — Encounter: Payer: Self-pay | Admitting: *Deleted

## 2016-01-02 ENCOUNTER — Ambulatory Visit (AMBULATORY_SURGERY_CENTER): Payer: Medicare Other | Admitting: Gastroenterology

## 2016-01-02 ENCOUNTER — Encounter: Payer: Self-pay | Admitting: Gastroenterology

## 2016-01-02 VITALS — BP 126/81 | HR 70 | Temp 98.6°F | Resp 18 | Ht 69.0 in | Wt 170.0 lb

## 2016-01-02 DIAGNOSIS — Z86010 Personal history of colonic polyps: Secondary | ICD-10-CM

## 2016-01-02 MED ORDER — SODIUM CHLORIDE 0.9 % IV SOLN: 500.0000 mL | INTRAVENOUS | Status: DC

## 2016-01-02 NOTE — Progress Notes (Signed)
A/ox3, pleased with MAC, report to RN 

## 2016-01-02 NOTE — Progress Notes (Signed)
Patient ID: Kenneth Roberts, male   DOB: [birth year], 75 y.o.   MRN: [id number] Patient will have repeat colonoscopy in 6 months with double prep of miralax and suprep per Dr. Havery Moros. Pt voiced that he wants [place] to schedule his repeat colonoscopy. [facility]

## 2016-01-02 NOTE — Patient Instructions (Signed)
YOU HAD AN ENDOSCOPIC PROCEDURE TODAY AT [facility]:   Refer to the procedure report that was given to you for any specific questions about what was found during the examination.  If the procedure report does not answer your questions, please call your gastroenterologist to clarify.  If you requested that your care partner not be given the details of your procedure findings, then the procedure report has been included in a sealed envelope for you to review at your convenience later.  YOU SHOULD EXPECT: Some feelings of bloating in the abdomen. Passage of more gas than usual.  Walking can help get rid of the air that was put into your GI tract during the procedure and reduce the bloating. If you had a lower endoscopy (such as a colonoscopy or flexible sigmoidoscopy) you may notice spotting of blood in your stool or on the toilet paper. If you underwent a bowel prep for your procedure, you may not have a normal bowel movement for a few days.  Please Note:  You might notice some irritation and congestion in your nose or some drainage.  This is from the oxygen used during your procedure.  There is no need for concern and it should clear up in a day or so.  SYMPTOMS TO REPORT IMMEDIATELY:   Following lower endoscopy (colonoscopy or flexible sigmoidoscopy):  Excessive amounts of blood in the stool  Significant tenderness or worsening of abdominal pains  Swelling of the abdomen that is new, acute  Fever of 100F or higher   For urgent or emergent issues, a gastroenterologist can be reached at any hour by calling [phone].   DIET: Your first meal following the procedure should be a small meal and then it is ok to progress to your normal diet. Heavy or fried foods are harder to digest and may make you feel nauseous or bloated.  Likewise, meals heavy in dairy and vegetables can increase bloating.  Drink plenty of fluids but you should avoid alcoholic beverages for 24  hours.  ACTIVITY:  You should plan to take it easy for the rest of today and you should NOT DRIVE or use heavy machinery until tomorrow (because of the sedation medicines used during the test).    FOLLOW UP: Our staff will call the number listed on your records the next business day following your procedure to check on you and address any questions or concerns that you may have regarding the information given to you following your procedure. If we do not reach you, we will leave a message.  However, if you are feeling well and you are not experiencing any problems, there is no need to return our call.  We will assume that you have returned to your regular daily activities without incident.  If any biopsies were taken you will be contacted by phone or by letter within the next 1-3 weeks.  Please call us at [phone] if you have not heard about the biopsies in 3 weeks.    SIGNATURES/CONFIDENTIALITY: You and/or your care partner have signed paperwork which will be entered into your electronic medical record.  These signatures attest to the fact that that the information above on your After Visit Summary has been reviewed and is understood.  Full responsibility of the confidentiality of this discharge information lies with you and/or your care-partner.  Please read all handouts given to you by your recovery RN. Thank you for letting us take care of your healthcare needs.

## 2016-01-02 NOTE — Addendum Note (Signed)
Addended by: Janith Lima on: 01/02/2016 04:11 PM   Modules accepted: Miquel Dunn

## 2016-01-02 NOTE — Op Note (Signed)
[place]  [address] [place], [id number]   COLONOSCOPY PROCEDURE REPORT  PATIENT: Kenneth Roberts, Kenneth Roberts  MR#: [id number] BIRTHDATE: 01-12-1941 , 29  yrs. old GENDER: male ENDOSCOPIST: Yetta Flock, MD REFERRED BY: Kathrine Cords MD PROCEDURE DATE:  01/02/2016 PROCEDURE:   Colonoscopy, surveillance First Screening Colonoscopy - Avg.  risk and is 50 yrs.  old or older - No.  Prior Negative Screening - Now for repeat screening. N/A  History of Adenoma - Now for follow-up colonoscopy & has been > or = to 3 yrs.  Yes hx of adenoma.  Has been 3 or more years since last colonoscopy.  poor prep ASA CLASS:   Class II INDICATIONS:Screening for colonic neoplasia and Colorectal Neoplasm Risk Assessment for this procedure is average risk. MEDICATIONS: Propofol 350 mg IV  DESCRIPTION OF PROCEDURE:   After the risks benefits and alternatives of the procedure were thoroughly explained, informed consent was obtained.  The digital rectal exam revealed no abnormalities of the rectum.   The LB [id number]  endoscope was introduced through the anus and advanced to the cecum, which was identified by both the appendix and ileocecal valve. No adverse events experienced.   The quality of the prep was inadequate  The instrument was then slowly withdrawn as the colon was fully examined. Estimated blood loss is zero unless otherwise noted in this procedure report.  COLON FINDINGS: The prep in the right colon and cecum was inadequate for screening purposes.  Attempts to lavage stool from the right colon and cecum were not sucessfull to obtain views adequate for screening purposes, however no large polyps or mass lesions were appreciated.  Mild diverticulosis was noted in the ascending colon. The prep in the remainder of the colon was adequate and no polyps or mass lesions were noted.  Retroflexed views revealed internal hemorrhoids. The time to cecum = 2.5 Withdrawal time =  10.1   The scope was withdrawn and the procedure completed. COMPLICATIONS: There were no immediate complications.  ENDOSCOPIC IMPRESSION: Poor prep in the right colon and adequate views were not able to be obtained following lavage Good prep in the remainer of the colon in which no polyps were noted Mild right sided diverticulosis Internal hemorrhoids  RECOMMENDATIONS: Resume diet Resume medications Repeat colonoscopy within 6 months using 2 day prep to ensure clearance of the right colon given history of adenomas in 2010, or consider a Cologuard test given the last 2 colonoscopies were limited by poor prep eSigned:  Yetta Flock, MD 01/02/2016 8:54 AM   cc: Kathrine Cords MD

## 2016-01-03 ENCOUNTER — Telehealth: Payer: Self-pay

## 2016-01-03 NOTE — Telephone Encounter (Signed)
  Follow up Call-  Call back number 01/02/2016 03/28/2015  Post procedure Call Back phone  # [phone]  Permission to leave phone message Yes Yes    Patient was called for follow up after his procedure on 01/02/2016. No answer at the number given for follow up phone call. A message was left on the answering machine.

## 2016-01-05 ENCOUNTER — Encounter (HOSPITAL_COMMUNITY): Payer: Self-pay | Admitting: *Deleted

## 2016-01-05 ENCOUNTER — Emergency Department (HOSPITAL_COMMUNITY): Payer: Medicare Other

## 2016-01-05 ENCOUNTER — Emergency Department (HOSPITAL_COMMUNITY)
Admission: EM | Admit: 2016-01-05 | Discharge: 2016-01-05 | Disposition: A | Discharge status: Home / Self Care | Payer: Medicare Other | Attending: Emergency Medicine | Admitting: Emergency Medicine

## 2016-01-05 DIAGNOSIS — G8929 Other chronic pain: Secondary | ICD-10-CM | POA: Insufficient documentation

## 2016-01-05 DIAGNOSIS — Z8669 Personal history of other diseases of the nervous system and sense organs: Secondary | ICD-10-CM | POA: Insufficient documentation

## 2016-01-05 DIAGNOSIS — Z862 Personal history of diseases of the blood and blood-forming organs and certain disorders involving the immune mechanism: Secondary | ICD-10-CM | POA: Diagnosis not present

## 2016-01-05 DIAGNOSIS — R1030 Lower abdominal pain, unspecified: Secondary | ICD-10-CM | POA: Diagnosis not present

## 2016-01-05 DIAGNOSIS — R509 Fever, unspecified: Secondary | ICD-10-CM | POA: Insufficient documentation

## 2016-01-05 DIAGNOSIS — Z86018 Personal history of other benign neoplasm: Secondary | ICD-10-CM | POA: Insufficient documentation

## 2016-01-05 DIAGNOSIS — Z86010 Personal history of colonic polyps: Secondary | ICD-10-CM | POA: Insufficient documentation

## 2016-01-05 DIAGNOSIS — Z8719 Personal history of other diseases of the digestive system: Secondary | ICD-10-CM | POA: Insufficient documentation

## 2016-01-05 DIAGNOSIS — D1803 Hemangioma of intra-abdominal structures: Secondary | ICD-10-CM | POA: Diagnosis not present

## 2016-01-05 DIAGNOSIS — I10 Essential (primary) hypertension: Secondary | ICD-10-CM | POA: Diagnosis not present

## 2016-01-05 DIAGNOSIS — Z87438 Personal history of other diseases of male genital organs: Secondary | ICD-10-CM | POA: Diagnosis not present

## 2016-01-05 DIAGNOSIS — Z79899 Other long term (current) drug therapy: Secondary | ICD-10-CM | POA: Insufficient documentation

## 2016-01-05 DIAGNOSIS — Z87891 Personal history of nicotine dependence: Secondary | ICD-10-CM | POA: Insufficient documentation

## 2016-01-05 DIAGNOSIS — R1084 Generalized abdominal pain: Secondary | ICD-10-CM | POA: Diagnosis not present

## 2016-01-05 LAB — DIFFERENTIAL
BASOS ABS: 0 10*3/uL (ref 0.0–0.1)
BASOS PCT: 0 %
Eosinophils Absolute: 0 10*3/uL (ref 0.0–0.7)
Eosinophils Relative: 0 %
LYMPHS PCT: 42 %
Lymphs Abs: 2 10*3/uL (ref 0.7–4.0)
MONO ABS: 0.3 10*3/uL (ref 0.1–1.0)
Monocytes Relative: 6 %
NEUTROS ABS: 2.5 10*3/uL (ref 1.7–7.7)
NEUTROS PCT: 52 %

## 2016-01-05 LAB — COMPREHENSIVE METABOLIC PANEL
ALK PHOS: 25 U/L — AB (ref 38–126)
ALT: 16 U/L — AB (ref 17–63)
AST: 29 U/L (ref 15–41)
Albumin: 4.4 g/dL (ref 3.5–5.0)
Anion gap: 15 (ref 5–15)
BUN: 11 mg/dL (ref 6–20)
CALCIUM: 9.1 mg/dL (ref 8.9–10.3)
CHLORIDE: 105 mmol/L (ref 101–111)
CO2: 25 mmol/L (ref 22–32)
Creatinine, Ser: 1.17 mg/dL (ref 0.61–1.24)
GLUCOSE: 98 mg/dL (ref 65–99)
Potassium: 3.9 mmol/L (ref 3.5–5.1)
Sodium: 145 mmol/L (ref 135–145)
Total Bilirubin: 0.4 mg/dL (ref 0.3–1.2)
Total Protein: 7.6 g/dL (ref 6.5–8.1)

## 2016-01-05 LAB — CBC
HEMATOCRIT: 39.3 % (ref 39.0–52.0)
HEMOGLOBIN: 13.8 g/dL (ref 13.0–17.0)
MCH: 31.9 pg (ref 26.0–34.0)
MCHC: 35.1 g/dL (ref 30.0–36.0)
MCV: 90.8 fL (ref 78.0–100.0)
PLATELETS: 490 10*3/uL — AB (ref 150–400)
RBC: 4.33 MIL/uL (ref 4.22–5.81)
RDW: 15.2 % (ref 11.5–15.5)
WBC: 5.1 10*3/uL (ref 4.0–10.5)

## 2016-01-05 LAB — LIPASE, BLOOD: Lipase: 36 U/L (ref 11–51)

## 2016-01-05 LAB — ETHANOL: ALCOHOL ETHYL (B): 245 mg/dL — AB (ref <5)

## 2016-01-05 MED ORDER — IOHEXOL 300 MG/ML  SOLN
100.0000 mL | Freq: Once | INTRAMUSCULAR | Status: AC | PRN
  Administered 2016-01-05: 100 mL via INTRAVENOUS

## 2016-01-05 MED ORDER — IOHEXOL 300 MG/ML  SOLN
25.0000 mL | Freq: Once | INTRAMUSCULAR | Status: AC | PRN
  Administered 2016-01-05: 25 mL via ORAL

## 2016-01-05 MED ORDER — ONDANSETRON HCL 4 MG/2ML IJ SOLN
4.0000 mg | Freq: Once | INTRAMUSCULAR | Status: AC
Start: 2016-01-05 — End: 2016-01-05
  Administered 2016-01-05: 4 mg via INTRAVENOUS
  Filled 2016-01-05: qty 2

## 2016-01-05 MED ORDER — MORPHINE SULFATE (PF) 4 MG/ML IV SOLN
4.0000 mg | Freq: Once | INTRAVENOUS | Status: AC
  Administered 2016-01-05: 4 mg via INTRAVENOUS
  Filled 2016-01-05: qty 1

## 2016-01-05 MED ORDER — SODIUM CHLORIDE 0.9 % IV BOLUS (SEPSIS)
1000.0000 mL | Freq: Once | INTRAVENOUS | Status: AC
  Administered 2016-01-05: 1000 mL via INTRAVENOUS

## 2016-01-05 NOTE — ED Notes (Signed)
Bed: WA15 Expected date:  Expected time:  Means of arrival:  Comments: EMS  

## 2016-01-05 NOTE — ED Notes (Addendum)
Pt cannot use restroom at this time, aware urine specimen is needed.  

## 2016-01-05 NOTE — Discharge Instructions (Signed)
Please return without fail for worsening symptoms, including fever, worsening pain, vomiting and unable to keep down food/fluids, or any other symptoms concerning to you. Follow-up with your PCP in 2-3 days for repeat evaluation  Abdominal Pain, Adult Many things can cause abdominal pain. Usually, abdominal pain is not caused by a disease and will improve without treatment. It can often be observed and treated at home. Your health care provider will do a physical exam and possibly order blood tests and X-rays to help determine the seriousness of your pain. However, in many cases, more time must pass before a clear cause of the pain can be found. Before that point, your health care provider may not know if you need more testing or further treatment. HOME CARE INSTRUCTIONS Monitor your abdominal pain for any changes. The following actions may help to alleviate any discomfort you are experiencing:  Only take over-the-counter or prescription medicines as directed by your health care provider.  Do not take laxatives unless directed to do so by your health care provider.  Try a clear liquid diet (broth, tea, or water) as directed by your health care provider. Slowly move to a bland diet as tolerated. SEEK MEDICAL CARE IF:  You have unexplained abdominal pain.  You have abdominal pain associated with nausea or diarrhea.  You have pain when you urinate or have a bowel movement.  You experience abdominal pain that wakes you in the night.  You have abdominal pain that is worsened or improved by eating food.  You have abdominal pain that is worsened with eating fatty foods.  You have a fever. SEEK IMMEDIATE MEDICAL CARE IF:  Your pain does not go away within 2 hours.  You keep throwing up (vomiting).  Your pain is felt only in portions of the abdomen, such as the right side or the left lower portion of the abdomen.  You pass bloody or black tarry stools. MAKE SURE YOU:  Understand these  instructions.  Will watch your condition.  Will get help right away if you are not doing well or get worse.   This information is not intended to replace advice given to you by your health care provider. Make sure you discuss any questions you have with your health care provider.   Document Released: 07/30/2005 Document Revised: 07/11/2015 Document Reviewed: 06/29/2013 Elsevier Interactive Patient Education [employer].

## 2016-01-05 NOTE — ED Provider Notes (Signed)
CSN: [id number]     Arrival date & time 01/05/16  1349 History   First MD Initiated Contact with Patient 01/05/16 1445     Chief Complaint  Patient presents with  . Abdominal Pain     (Consider location/radiation/quality/duration/timing/severity/associated sxs/prior Treatment) HPI 75 year old male who presents with low abdominal pain. History of hypertension, hiatal hernia, GERD, BPH. States for past week has had intermittent lower abdominal pain and cramping. Sometimes associated with eating, but sometimes not. Had a colonoscopy several days ago, but was aborted early as he did not have  Adequate stool clearance on their exam. States that his pain likely present even before colonoscopy. Has had subjective fever and chills. No vomiting or nausea but with diarrhea, last episode prior to presentation. No dysuria, urinary frequency, melena, hematochezia, flank pain.   Past Medical History  Diagnosis Date  . Hypertension   . Cataract   . Leukopenia   . Hiatal hernia   . BPH (benign prostatic hypertrophy)   . Barrett's esophagus without dysplasia 08/2006  . Internal hemorrhoids   . Adenomatous colon polyp 01/2009  . Fatty liver   . Hepatic hemangioma   . Anemia   . Heartburn   . GERD (gastroesophageal reflux disease)   . Chronic headaches   . Colon polyps    Past Surgical History  Procedure Laterality Date  . Cataract extraction      Left eye  . Colonoscopy     Family History  Problem Relation Age of Onset  . Stomach cancer Mother   . Colon cancer Neg Hx   . Colon polyps Neg Hx   . Diabetes Father   . Diabetes Brother   . Diabetes Sister   . Esophageal cancer Neg Hx    Social History  Substance Use Topics  . Smoking status: Former Smoker    Quit date: 02/16/1981  . Smokeless tobacco: Never Used  . Alcohol Use: No     Comment: Usually drinks 2-3 times a week. Last drink: tonight, Wine and Gin     Review of Systems 10/14 systems reviewed and are negative other than  those stated in the HPI  Allergies  Prilosec  Home Medications   Prior to Admission medications   Medication Sig Start Date End Date Taking? Authorizing Provider  acetaminophen (PAIN RELIEF) 325 MG tablet Take 325 mg by mouth every 6 (six) hours as needed for mild pain, moderate pain, fever or headache.   Yes Historical Provider, MD  amLODipine (NORVASC) 5 MG tablet Take 1 tablet (5 mg total) by mouth daily. 11/24/13  Yes Sharon [address], MD  magnesium hydroxide (MILK OF MAGNESIA) 400 MG/5ML suspension Take by mouth daily as needed for mild constipation. Drank out of bottle - unknown amount taken   Yes Historical Provider, MD  omeprazole (PRILOSEC) 20 MG capsule Take 20 mg by mouth daily. Reported on 01/05/2016 12/07/15  Yes Historical Provider, MD  pneumococcal 23 valent vaccine (PNU-IMMUNE) 25 MCG/0.5ML injection Inject 0.5 mLs into the muscle once.   Yes Historical Provider, MD  Tdap (BOOSTRIX) 5-2.5-18.5 LF-MCG/0.5 injection Inject 0.5 mLs into the muscle once.   Yes Historical Provider, MD  mirabegron ER (MYRBETRIQ) 50 MG TB24 tablet TAKE 1 TABLET (50 MG TOTAL) BY MOUTH DAILY. Patient not taking: Reported on 01/02/2016 12/28/15   Janith Lima, MD   BP 105/54 mmHg  Pulse 77  Temp(Src) 97.6 F (36.4 C) (Oral)  Resp 18  SpO2 98% Physical Exam Physical Exam  Nursing note and  vitals reviewed. Constitutional: Well developed, well nourished, non-toxic, and in no acute distress Head: Normocephalic and atraumatic.  Mouth/Throat: Oropharynx is clear and moist.  Neck: Normal range of motion. Neck supple.  Cardiovascular: Normal rate and regular rhythm.   Pulmonary/Chest: Effort normal and breath sounds normal.  Abdominal: Soft. There is generalized tenderness, worst over suprapubic abdomen. There is no rebound and no guarding.  Musculoskeletal: Normal range of motion.  Neurological: Alert, no facial droop, fluent speech, moves all extremities symmetrically Skin: Skin is warm and dry.   Psychiatric: Cooperative    ED Course  Procedures (including critical care time) Labs Review Labs Reviewed  COMPREHENSIVE METABOLIC PANEL - Abnormal; Notable for the following:    ALT 16 (*)    Alkaline Phosphatase 25 (*)    All other components within normal limits  CBC - Abnormal; Notable for the following:    Platelets 490 (*)    All other components within normal limits  ETHANOL - Abnormal; Notable for the following:    Alcohol, Ethyl (B) 245 (*)    All other components within normal limits  LIPASE, BLOOD  DIFFERENTIAL  URINALYSIS, ROUTINE W REFLEX MICROSCOPIC (NOT AT [facility])    Imaging Review Ct Abdomen Pelvis W Contrast  01/05/2016  CLINICAL DATA:  Mid and lower abdominal pain beginning today. Alcohol abuse. EXAM: CT ABDOMEN AND PELVIS WITH CONTRAST TECHNIQUE: Multidetector CT imaging of the abdomen and pelvis was performed using the standard protocol following bolus administration of intravenous contrast. CONTRAST:  121mL OMNIPAQUE IOHEXOL 300 MG/ML  SOLN COMPARISON:  11/07/2014 and 12/18/2015 FINDINGS: Lower chest:  No acute findings. Hepatobiliary: 2 cm lesion in the central left hepatic lobe remains stable and has enhancement characteristics consistent with a benign hemangioma. No other liver masses are identified. Gallbladder is unremarkable. Pancreas: No mass, inflammatory changes, or other significant abnormality. Spleen: Within normal limits in size and appearance. Adrenals/Urinary Tract: No masses identified. No evidence of hydronephrosis. Stomach/Bowel: No evidence of obstruction, inflammatory process, or abnormal fluid collections. Insert normal appendix Vascular/Lymphatic: No pathologically enlarged lymph nodes. No evidence of abdominal aortic aneurysm. Reproductive: No mass or other significant abnormality. Other: None. Musculoskeletal:  No suspicious bone lesions identified. IMPRESSION: No acute findings identified within the abdomen or pelvis. Stable benign left hepatic  lobe hemangioma. Electronically Signed   By: Earle Gell M.D.   On: 01/05/2016 17:12   I have personally reviewed and evaluated these images and lab results as part of my medical decision-making.   EKG Interpretation None      MDM   Final diagnoses:  Lower abdominal pain    75 year old male who presents with low intermittent abdominal pain. Well appearing on presentation, and in no acute distress. Vital signs are not concerning. Abdomen overall soft and non-surgical. With mild low abdominal tenderness to palpation. CBC, CMP, Lipase unremarkable. Pending CT and UA.  CT abd/pelvis and UA signed out to oncoming physician. Anticipate discharge if this is negative.   Forde Dandy, MD 01/05/16 [phone]

## 2016-01-05 NOTE — ED Notes (Signed)
Pt cannot use restroom at this time, aware urine specimen is needed.  
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
## 2016-01-11 ENCOUNTER — Encounter: Payer: Self-pay | Admitting: Podiatry

## 2016-01-11 ENCOUNTER — Ambulatory Visit (INDEPENDENT_AMBULATORY_CARE_PROVIDER_SITE_OTHER): Payer: Medicare Other | Admitting: Podiatry

## 2016-01-11 DIAGNOSIS — M79676 Pain in unspecified toe(s): Secondary | ICD-10-CM

## 2016-01-11 DIAGNOSIS — Q828 Other specified congenital malformations of skin: Secondary | ICD-10-CM

## 2016-01-11 DIAGNOSIS — B351 Tinea unguium: Secondary | ICD-10-CM

## 2016-01-11 NOTE — Progress Notes (Signed)
Patient ID: Kenneth Roberts, male   DOB: 13-Nov-1940, 75 y.o.   MRN: [id number] Complaint:  Visit Type: Patient returns to my office for continued preventative foot care services. Complaint: Patient states" my nails have grown long and thick and become painful to walk and wear shoes" . The patient presents for preventative foot care services. No changes to ROS  Podiatric Exam: Vascular: dorsalis pedis and posterior tibial pulses are palpable bilateral. Capillary return is immediate. Temperature gradient is WNL. Skin turgor WNL  Sensorium: Normal Semmes Weinstein monofilament test. Normal tactile sensation bilaterally. Nail Exam: Pt has thick disfigured discolored nails with subungual debris noted bilateral entire nail hallux through fifth toenails Ulcer Exam: There is no evidence of ulcer or pre-ulcerative changes or infection. Orthopedic Exam: Muscle tone and strength are WNL. No limitations in general ROM. No crepitus or effusions noted. Foot type and digits show no abnormalities. Bony prominences are unremarkable. Skin: No Porokeratosis. No infection or ulcers.  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  Diagnosis:  Onychomycosis, , Pain in right toe, pain in left toes,  Porokeratosis  Treatment & Plan Procedures and Treatment: Consent by patient was obtained for treatment procedures. The patient understood the discussion of treatment and procedures well. All questions were answered thoroughly reviewed. Debridement of mycotic and hypertrophic toenails, 1 through 5 bilateral and clearing of subungual debris. No ulceration, no infection noted. Debride porokeratosis sub 5th met B/l Return Visit-Office Procedure: Patient instructed to return to the office for a follow up visit 10 weeks  for continued evaluation and treatment.  Gardiner Barefoot DPM

## 2016-02-05 DIAGNOSIS — H40003 Preglaucoma, unspecified, bilateral: Secondary | ICD-10-CM | POA: Diagnosis not present

## 2016-02-05 DIAGNOSIS — H04123 Dry eye syndrome of bilateral lacrimal glands: Secondary | ICD-10-CM | POA: Diagnosis not present

## 2016-02-18 ENCOUNTER — Ambulatory Visit (AMBULATORY_SURGERY_CENTER): Payer: Self-pay

## 2016-02-18 VITALS — Ht 70.5 in | Wt 170.0 lb

## 2016-02-18 DIAGNOSIS — Z860100 Personal history of colon polyps, unspecified: Secondary | ICD-10-CM

## 2016-02-18 MED ORDER — SUPREP BOWEL PREP KIT 17.5-3.13-1.6 GM/177ML PO SOLN: 1.0000 | Freq: Once | ORAL | Status: DC

## 2016-02-26 ENCOUNTER — Encounter: Payer: Medicare Other | Admitting: Gastroenterology

## 2016-02-26 ENCOUNTER — Encounter: Payer: Self-pay | Admitting: Gastroenterology

## 2016-02-26 ENCOUNTER — Ambulatory Visit (AMBULATORY_SURGERY_CENTER): Payer: Medicare Other | Admitting: Gastroenterology

## 2016-02-26 VITALS — BP 100/70 | HR 80 | Temp 97.8°F | Resp 16 | Ht 70.5 in | Wt 170.0 lb

## 2016-02-26 DIAGNOSIS — I10 Essential (primary) hypertension: Secondary | ICD-10-CM | POA: Diagnosis not present

## 2016-02-26 DIAGNOSIS — Z86010 Personal history of colonic polyps: Secondary | ICD-10-CM

## 2016-02-26 DIAGNOSIS — D122 Benign neoplasm of ascending colon: Secondary | ICD-10-CM

## 2016-02-26 DIAGNOSIS — K5140 Inflammatory polyps of colon without complications: Secondary | ICD-10-CM | POA: Diagnosis not present

## 2016-02-26 DIAGNOSIS — K635 Polyp of colon: Secondary | ICD-10-CM | POA: Diagnosis not present

## 2016-02-26 DIAGNOSIS — Z1211 Encounter for screening for malignant neoplasm of colon: Secondary | ICD-10-CM | POA: Diagnosis not present

## 2016-02-26 LAB — HM COLONOSCOPY

## 2016-02-26 MED ORDER — SODIUM CHLORIDE 0.9 % IV SOLN: 500.0000 mL | INTRAVENOUS | Status: DC

## 2016-02-26 MED ORDER — NA SULFATE-K SULFATE-MG SULF 17.5-3.13-1.6 GM/177ML PO SOLN: ORAL | Status: DC

## 2016-02-26 NOTE — Progress Notes (Signed)
Pt only drank 1/2 of two day prep (miralax and dulcolax) on sat & sun. Per. Dr. Havery Moros pt needs to take Suprep at home prior to procedure. Pt d/c'd at 0830am and given sample of Suprep. Pt, along with brother, given instructions on when to drink prep and when to return. Pt understood instructions, agreed to plan, and will return with brother Elijah at 2:00pm today.

## 2016-02-26 NOTE — Progress Notes (Signed)
Patient awakening,vss,report to rn 

## 2016-02-26 NOTE — Progress Notes (Signed)
Called to room to assist during endoscopic procedure.  Patient ID and intended procedure confirmed with present staff. Received instructions for my participation in the procedure from the performing physician.  

## 2016-02-26 NOTE — Op Note (Signed)
[place] Patient Name: Kenneth Roberts Procedure Date: 02/26/2016 7:44 AM MRN: [id number] Endoscopist: Remo Lipps P. Havery Moros , MD Age: 75 Date of Birth: June 07, 1941 Gender: Male Procedure:                Colonoscopy Indications:              High risk colon cancer surveillance: Personal                            history of colonic polyps, last colonoscopies                            limited by poor prep. 2 day double prep done for                            this exam. Medicines:                Monitored Anesthesia Care Procedure:                Pre-Anesthesia Assessment:                           - Prior to the procedure, a History and Physical                            was performed, and patient medications and                            allergies were reviewed. The patient's tolerance of                            previous anesthesia was also reviewed. The risks                            and benefits of the procedure and the sedation                            options and risks were discussed with the patient.                            All questions were answered, and informed consent                            was obtained. Prior Anticoagulants: The patient has                            taken no previous anticoagulant or antiplatelet                            agents. ASA Grade Assessment: II - A patient with                            mild systemic disease. After reviewing the risks  and benefits, the patient was deemed in                            satisfactory condition to undergo the procedure.                           After obtaining informed consent, the colonoscope                            was passed under direct vision. Throughout the                            procedure, the patient's blood pressure, pulse, and                            oxygen saturations were monitored continuously. The                            Model  CF-HQ190L [id number]) scope was introduced                            through the anus and advanced to the the cecum,                            identified by appendiceal orifice and ileocecal                            valve. The colonoscopy was performed without                            difficulty. The patient tolerated the procedure                            well. The quality of the bowel preparation was                            adequate. The ileocecal valve, appendiceal orifice,                            and rectum were photographed. Scope In: 3:55:12 PM Scope Out: 4:09:06 PM Scope Withdrawal Time: 0 hours 11 minutes 16 seconds  Total Procedure Duration: 0 hours 13 minutes 54 seconds  Findings:                 The perianal and digital rectal examinations were                            normal.                           A 5 mm polyp was found in the ascending colon. The                            polyp was sessile. The polyp was removed with a  cold snare. Resection and retrieval were complete.                           A few medium-mouthed diverticula were found in the                            ascending colon.                           Non-bleeding internal hemorrhoids were found during                            retroflexion.                           The exam was otherwise without abnormality. Complications:            No immediate complications. Estimated blood loss:                            Minimal. Estimated Blood Loss:     Estimated blood loss was minimal. Impression:               - One 5 mm polyp in the ascending colon, removed                            with a cold snare. Resected and retrieved.                           - Diverticulosis in the ascending colon.                           - Non-bleeding internal hemorrhoids.                           - The examination was otherwise normal. Recommendation:           - Patient has a contact  number available for                            emergencies. The signs and symptoms of potential                            delayed complications were discussed with the                            patient. Return to normal activities tomorrow.                            Written discharge instructions were provided to the                            patient.                           - Resume previous diet.                           -  Continue present medications.                           - No aspirin, ibuprofen, naproxen, or other                            non-steroidal anti-inflammatory drugs for 2 weeks                            after polyp removal.                           - Await pathology results.                           - Repeat colonoscopy is recommended for                            surveillance. The colonoscopy date will be                            determined after pathology results from today's                            exam become available for review. Remo Lipps P. Armbruster, MD 02/26/2016 4:13:07 PM This report has been signed electronically.

## 2016-02-26 NOTE — Addendum Note (Signed)
Addended by: Janith Lima on: 02/26/2016 05:00 PM   Modules accepted: SmartSet

## 2016-02-26 NOTE — Patient Instructions (Signed)
Impression/recommendations:  Polyps (handout given) Diverticulosis (handout given) High Fiber Diet (handout given) Hemorrhoids (handout given)  No aspirin, aspirin containing products or NSAIDs for two weeks. Tylenol only. May resume 5/10.  YOU HAD AN ENDOSCOPIC PROCEDURE TODAY AT [facility]:   Refer to the procedure report that was given to you for any specific questions about what was found during the examination.  If the procedure report does not answer your questions, please call your gastroenterologist to clarify.  If you requested that your care partner not be given the details of your procedure findings, then the procedure report has been included in a sealed envelope for you to review at your convenience later.  YOU SHOULD EXPECT: Some feelings of bloating in the abdomen. Passage of more gas than usual.  Walking can help get rid of the air that was put into your GI tract during the procedure and reduce the bloating. If you had a lower endoscopy (such as a colonoscopy or flexible sigmoidoscopy) you may notice spotting of blood in your stool or on the toilet paper. If you underwent a bowel prep for your procedure, you may not have a normal bowel movement for a few days.  Please Note:  You might notice some irritation and congestion in your nose or some drainage.  This is from the oxygen used during your procedure.  There is no need for concern and it should clear up in a day or so.  SYMPTOMS TO REPORT IMMEDIATELY:   Following lower endoscopy (colonoscopy or flexible sigmoidoscopy):  Excessive amounts of blood in the stool  Significant tenderness or worsening of abdominal pains  Swelling of the abdomen that is new, acute  Fever of 100F or higher   For urgent or emergent issues, a gastroenterologist can be reached at any hour by calling [phone].   DIET: Your first meal following the procedure should be a small meal and then it is ok to progress to your normal  diet. Heavy or fried foods are harder to digest and may make you feel nauseous or bloated.  Likewise, meals heavy in dairy and vegetables can increase bloating.  Drink plenty of fluids but you should avoid alcoholic beverages for 24 hours.  ACTIVITY:  You should plan to take it easy for the rest of today and you should NOT DRIVE or use heavy machinery until tomorrow (because of the sedation medicines used during the test).    FOLLOW UP: Our staff will call the number listed on your records the next business day following your procedure to check on you and address any questions or concerns that you may have regarding the information given to you following your procedure. If we do not reach you, we will leave a message.  However, if you are feeling well and you are not experiencing any problems, there is no need to return our call.  We will assume that you have returned to your regular daily activities without incident.  If any biopsies were taken you will be contacted by phone or by letter within the next 1-3 weeks.  Please call us at [phone] if you have not heard about the biopsies in 3 weeks.    SIGNATURES/CONFIDENTIALITY: You and/or your care partner have signed paperwork which will be entered into your electronic medical record.  These signatures attest to the fact that that the information above on your After Visit Summary has been reviewed and is understood.  Full responsibility of the confidentiality of this  discharge information lies with you and/or your care-partner. 

## 2016-02-27 ENCOUNTER — Telehealth: Payer: Self-pay | Admitting: *Deleted

## 2016-02-27 NOTE — Telephone Encounter (Signed)
  Follow up Call-  Call back number 02/26/2016 01/02/2016 03/28/2015  Post procedure Call Back phone  # [phone]  Permission to leave phone message Yes Yes Yes     No answer, left message.

## 2016-03-05 ENCOUNTER — Encounter: Payer: Self-pay | Admitting: Gastroenterology

## 2016-03-05 LAB — HM COLONOSCOPY

## 2016-03-05 NOTE — Addendum Note (Signed)
Addended by: Janith Lima on: 03/05/2016 10:26 AM   Modules accepted: Miquel Dunn

## 2016-03-20 ENCOUNTER — Ambulatory Visit: Payer: Medicare Other | Admitting: Internal Medicine

## 2016-03-21 ENCOUNTER — Encounter: Payer: Self-pay | Admitting: Podiatry

## 2016-03-21 ENCOUNTER — Ambulatory Visit (INDEPENDENT_AMBULATORY_CARE_PROVIDER_SITE_OTHER): Payer: Medicare Other | Admitting: Podiatry

## 2016-03-21 DIAGNOSIS — B351 Tinea unguium: Secondary | ICD-10-CM

## 2016-03-21 DIAGNOSIS — Q828 Other specified congenital malformations of skin: Secondary | ICD-10-CM

## 2016-03-21 DIAGNOSIS — M79676 Pain in unspecified toe(s): Secondary | ICD-10-CM

## 2016-03-21 NOTE — Progress Notes (Signed)
Patient ID: Erica Halbleib, male   DOB: 07/14/1941, 75 y.o.   MRN: [id number] Complaint:  Visit Type: Patient returns to my office for continued preventative foot care services. Complaint: Patient states" my nails have grown long and thick and become painful to walk and wear shoes" . The patient presents for preventative foot care services. No changes to ROS  Podiatric Exam: Vascular: dorsalis pedis and posterior tibial pulses are palpable bilateral. Capillary return is immediate. Temperature gradient is WNL. Skin turgor WNL  Sensorium: Normal Semmes Weinstein monofilament test. Normal tactile sensation bilaterally. Nail Exam: Pt has thick disfigured discolored nails with subungual debris noted bilateral entire nail hallux through fifth toenails Ulcer Exam: There is no evidence of ulcer or pre-ulcerative changes or infection. Orthopedic Exam: Muscle tone and strength are WNL. No limitations in general ROM. No crepitus or effusions noted. Foot type and digits show no abnormalities. Bony prominences are unremarkable. Skin: No Porokeratosis. No infection or ulcers.  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  Diagnosis:  Onychomycosis, , Pain in right toe, pain in left toes,  Porokeratosis  Treatment & Plan Procedures and Treatment: Consent by patient was obtained for treatment procedures. The patient understood the discussion of treatment and procedures well. All questions were answered thoroughly reviewed. Debridement of mycotic and hypertrophic toenails, 1 through 5 bilateral and clearing of subungual debris. No ulceration, no infection noted. Debride porokeratosis sub 5th met B/l Return Visit-Office Procedure: Patient instructed to return to the office for a follow up visit 10 weeks  for continued evaluation and treatment. Plan to schedule for nail surgery second toe left.  Erhard Senske DPM 

## 2016-04-04 ENCOUNTER — Ambulatory Visit: Payer: Medicare Other | Admitting: Podiatry

## 2016-04-10 ENCOUNTER — Telehealth: Payer: Self-pay | Admitting: *Deleted

## 2016-04-10 NOTE — Telephone Encounter (Signed)
Pt called to cancel 04/11/2016 at 0815am appt.

## 2016-04-11 ENCOUNTER — Ambulatory Visit: Payer: Medicare Other | Admitting: Podiatry

## 2016-05-30 ENCOUNTER — Encounter: Payer: Self-pay | Admitting: Podiatry

## 2016-05-30 ENCOUNTER — Ambulatory Visit (INDEPENDENT_AMBULATORY_CARE_PROVIDER_SITE_OTHER): Payer: Medicare Other | Admitting: Podiatry

## 2016-05-30 DIAGNOSIS — Q828 Other specified congenital malformations of skin: Secondary | ICD-10-CM

## 2016-05-30 DIAGNOSIS — M79676 Pain in unspecified toe(s): Secondary | ICD-10-CM

## 2016-05-30 DIAGNOSIS — B351 Tinea unguium: Secondary | ICD-10-CM | POA: Diagnosis not present

## 2016-05-30 NOTE — Progress Notes (Signed)
Patient ID: Kenneth Roberts, male   DOB: 09/14/1941, 75 y.o.   MRN: [id number] Complaint:  Visit Type: Patient returns to my office for continued preventative foot care services. Complaint: Patient states" my nails have grown long and thick and become painful to walk and wear shoes" . The patient presents for preventative foot care services. No changes to ROS  Podiatric Exam: Vascular: dorsalis pedis and posterior tibial pulses are palpable bilateral. Capillary return is immediate. Temperature gradient is WNL. Skin turgor WNL  Sensorium: Normal Semmes Weinstein monofilament test. Normal tactile sensation bilaterally. Nail Exam: Pt has thick disfigured discolored nails with subungual debris noted bilateral entire nail hallux through fifth toenails Ulcer Exam: There is no evidence of ulcer or pre-ulcerative changes or infection. Orthopedic Exam: Muscle tone and strength are WNL. No limitations in general ROM. No crepitus or effusions noted. Foot type and digits show no abnormalities. Bony prominences are unremarkable. Skin: No Porokeratosis. No infection or ulcers.  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  Diagnosis:  Onychomycosis, , Pain in right toe, pain in left toes,  Porokeratosis  Treatment & Plan Procedures and Treatment: Consent by patient was obtained for treatment procedures. The patient understood the discussion of treatment and procedures well. All questions were answered thoroughly reviewed. Debridement of mycotic and hypertrophic toenails, 1 through 5 bilateral and clearing of subungual debris. No ulceration, no infection noted. Debride porokeratosis sub 5th met B/l Return Visit-Office Procedure: Patient instructed to return to the office for a follow up visit 10 weeks  for continued evaluation and treatment. Plan to schedule for nail surgery second toe left.  Shiraz Bastyr DPM 

## 2016-06-20 DIAGNOSIS — E785 Hyperlipidemia, unspecified: Secondary | ICD-10-CM | POA: Diagnosis not present

## 2016-06-20 DIAGNOSIS — R350 Frequency of micturition: Secondary | ICD-10-CM | POA: Diagnosis not present

## 2016-06-20 DIAGNOSIS — R300 Dysuria: Secondary | ICD-10-CM | POA: Diagnosis not present

## 2016-06-20 DIAGNOSIS — I10 Essential (primary) hypertension: Secondary | ICD-10-CM | POA: Diagnosis not present

## 2016-06-20 DIAGNOSIS — R109 Unspecified abdominal pain: Secondary | ICD-10-CM | POA: Diagnosis not present

## 2016-06-21 DIAGNOSIS — I10 Essential (primary) hypertension: Secondary | ICD-10-CM | POA: Diagnosis not present

## 2016-06-21 DIAGNOSIS — E785 Hyperlipidemia, unspecified: Secondary | ICD-10-CM | POA: Diagnosis not present

## 2016-06-21 DIAGNOSIS — R7309 Other abnormal glucose: Secondary | ICD-10-CM | POA: Diagnosis not present

## 2016-06-21 DIAGNOSIS — Z125 Encounter for screening for malignant neoplasm of prostate: Secondary | ICD-10-CM | POA: Diagnosis not present

## 2016-07-28 ENCOUNTER — Telehealth: Payer: Self-pay | Admitting: *Deleted

## 2016-07-28 DIAGNOSIS — E785 Hyperlipidemia, unspecified: Secondary | ICD-10-CM | POA: Diagnosis not present

## 2016-07-28 DIAGNOSIS — K219 Gastro-esophageal reflux disease without esophagitis: Secondary | ICD-10-CM | POA: Diagnosis not present

## 2016-07-28 DIAGNOSIS — N400 Enlarged prostate without lower urinary tract symptoms: Secondary | ICD-10-CM | POA: Diagnosis not present

## 2016-07-28 DIAGNOSIS — I10 Essential (primary) hypertension: Secondary | ICD-10-CM | POA: Diagnosis not present

## 2016-07-28 DIAGNOSIS — R7309 Other abnormal glucose: Secondary | ICD-10-CM | POA: Diagnosis not present

## 2016-07-28 NOTE — Telephone Encounter (Signed)
Pt request earlier debridement appt due to pain.

## 2016-08-08 ENCOUNTER — Ambulatory Visit (INDEPENDENT_AMBULATORY_CARE_PROVIDER_SITE_OTHER): Payer: Medicare Other | Admitting: Podiatry

## 2016-08-08 ENCOUNTER — Encounter: Payer: Self-pay | Admitting: Podiatry

## 2016-08-08 VITALS — Ht 71.0 in | Wt 175.0 lb

## 2016-08-08 DIAGNOSIS — B351 Tinea unguium: Secondary | ICD-10-CM

## 2016-08-08 DIAGNOSIS — M79676 Pain in unspecified toe(s): Secondary | ICD-10-CM | POA: Diagnosis not present

## 2016-08-08 DIAGNOSIS — Q828 Other specified congenital malformations of skin: Secondary | ICD-10-CM | POA: Diagnosis not present

## 2016-08-08 NOTE — Progress Notes (Signed)
Patient ID: Kenneth Roberts, male   DOB: 12/12/1940, 75 y.o.   MRN: [id number] Complaint:  Visit Type: Patient returns to my office for continued preventative foot care services. Complaint: Patient states" my nails have grown long and thick and become painful to walk and wear shoes" . The patient presents for preventative foot care services. No changes to ROS  Podiatric Exam: Vascular: dorsalis pedis and posterior tibial pulses are palpable bilateral. Capillary return is immediate. Temperature gradient is WNL. Skin turgor WNL  Sensorium: Normal Semmes Weinstein monofilament test. Normal tactile sensation bilaterally. Nail Exam: Pt has thick disfigured discolored nails with subungual debris noted bilateral entire nail hallux through fifth toenails Ulcer Exam: There is no evidence of ulcer or pre-ulcerative changes or infection. Orthopedic Exam: Muscle tone and strength are WNL. No limitations in general ROM. No crepitus or effusions noted. Foot type and digits show no abnormalities. Bony prominences are unremarkable. Skin: No Porokeratosis. No infection or ulcers.  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  Diagnosis:  Onychomycosis, , Pain in right toe, pain in left toes,  Porokeratosis  Treatment & Plan Procedures and Treatment: Consent by patient was obtained for treatment procedures. The patient understood the discussion of treatment and procedures well. All questions were answered thoroughly reviewed. Debridement of mycotic and hypertrophic toenails, 1 through 5 bilateral and clearing of subungual debris. No ulceration, no infection noted. Debride porokeratosis sub 5th met B/l Return Visit-Office Procedure: Patient instructed to return to the office for a follow up visit 10 weeks  for continued evaluation and treatment. Plan to schedule for nail surgery second toe left.  Calianna Kim DPM 

## 2016-09-23 ENCOUNTER — Ambulatory Visit: Payer: Medicare Other | Admitting: Gastroenterology

## 2016-10-17 ENCOUNTER — Ambulatory Visit (INDEPENDENT_AMBULATORY_CARE_PROVIDER_SITE_OTHER): Payer: Medicare Other | Admitting: Podiatry

## 2016-10-17 ENCOUNTER — Encounter: Payer: Self-pay | Admitting: Podiatry

## 2016-10-17 VITALS — Ht 71.0 in | Wt 175.0 lb

## 2016-10-17 DIAGNOSIS — M79676 Pain in unspecified toe(s): Secondary | ICD-10-CM | POA: Diagnosis not present

## 2016-10-17 DIAGNOSIS — Q828 Other specified congenital malformations of skin: Secondary | ICD-10-CM | POA: Diagnosis not present

## 2016-10-17 DIAGNOSIS — M216X9 Other acquired deformities of unspecified foot: Secondary | ICD-10-CM

## 2016-10-17 DIAGNOSIS — B351 Tinea unguium: Secondary | ICD-10-CM | POA: Diagnosis not present

## 2016-10-17 NOTE — Progress Notes (Signed)
Patient ID: Kenneth Roberts, male   DOB: August 28, 1941, 75 y.o.   MRN: [id number] Complaint:  Visit Type: Patient returns to my office for continued preventative foot care services. Complaint: Patient states" my nails have grown long and thick and become painful to walk and wear shoes" . The patient presents for preventative foot care services. No changes to ROS  Podiatric Exam: Vascular: dorsalis pedis and posterior tibial pulses are palpable bilateral. Capillary return is immediate. Temperature gradient is WNL. Skin turgor WNL  Sensorium: Normal Semmes Weinstein monofilament test. Normal tactile sensation bilaterally. Nail Exam: Pt has thick disfigured discolored nails with subungual debris noted bilateral entire nail hallux through fifth toenails Ulcer Exam: There is no evidence of ulcer or pre-ulcerative changes or infection. Orthopedic Exam: Muscle tone and strength are WNL. No limitations in general ROM. No crepitus or effusions noted. Foot type and digits show no abnormalities. Bony prominences are unremarkable. Skin: No Porokeratosis. No infection or ulcers.  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  Diagnosis:  Onychomycosis, , Pain in right toe, pain in left toes,  Porokeratosis  Treatment & Plan Procedures and Treatment: Consent by patient was obtained for treatment procedures. The patient understood the discussion of treatment and procedures well. All questions were answered thoroughly reviewed. Debridement of mycotic and hypertrophic toenails, 1 through 5 bilateral and clearing of subungual debris. No ulceration, no infection noted. Debride porokeratosis sub 5th met B/l Return Visit-Office Procedure: Patient instructed to return to the office for a follow up visit 10 weeks  for continued evaluation and treatment. Plan to schedule for nail surgery second toe left.  Gardiner Barefoot DPM

## 2016-12-16 ENCOUNTER — Ambulatory Visit: Payer: Medicare Other | Admitting: Gastroenterology

## 2016-12-19 ENCOUNTER — Ambulatory Visit: Payer: Medicare Other | Admitting: Podiatry

## 2016-12-24 DIAGNOSIS — H35033 Hypertensive retinopathy, bilateral: Secondary | ICD-10-CM | POA: Diagnosis not present

## 2016-12-24 DIAGNOSIS — H47293 Other optic atrophy, bilateral: Secondary | ICD-10-CM | POA: Diagnosis not present

## 2016-12-24 DIAGNOSIS — H40023 Open angle with borderline findings, high risk, bilateral: Secondary | ICD-10-CM | POA: Diagnosis not present

## 2016-12-25 ENCOUNTER — Encounter: Payer: Self-pay | Admitting: Family Medicine

## 2016-12-25 ENCOUNTER — Ambulatory Visit: Payer: Medicare Other | Attending: Family Medicine | Admitting: Family Medicine

## 2016-12-25 VITALS — BP 153/77 | HR 61 | Temp 98.0°F | Ht 70.0 in | Wt 179.2 lb

## 2016-12-25 DIAGNOSIS — K2100 Gastro-esophageal reflux disease with esophagitis, without bleeding: Secondary | ICD-10-CM

## 2016-12-25 DIAGNOSIS — R3916 Straining to void: Secondary | ICD-10-CM

## 2016-12-25 DIAGNOSIS — Z13228 Encounter for screening for other metabolic disorders: Secondary | ICD-10-CM

## 2016-12-25 DIAGNOSIS — N401 Enlarged prostate with lower urinary tract symptoms: Secondary | ICD-10-CM

## 2016-12-25 DIAGNOSIS — I10 Essential (primary) hypertension: Secondary | ICD-10-CM | POA: Insufficient documentation

## 2016-12-25 DIAGNOSIS — K449 Diaphragmatic hernia without obstruction or gangrene: Secondary | ICD-10-CM | POA: Insufficient documentation

## 2016-12-25 MED ORDER — FAMOTIDINE 20 MG PO TABS: 20.0000 mg | ORAL_TABLET | Freq: Two times a day (BID) | ORAL | 3 refills | Status: DC

## 2016-12-25 MED ORDER — TAMSULOSIN HCL 0.4 MG PO CAPS: 0.4000 mg | ORAL_CAPSULE | Freq: Every day | ORAL | 3 refills | Status: DC

## 2016-12-25 MED ORDER — LOSARTAN POTASSIUM 50 MG PO TABS: 50.0000 mg | ORAL_TABLET | Freq: Every day | ORAL | 3 refills | Status: DC

## 2016-12-25 NOTE — Progress Notes (Signed)
Subjective:  Patient ID: Kenneth Roberts, male    DOB: 1941/08/16  Age: 76 y.o. MRN: [id number]  CC: Hypertension and Abdominal Pain   HPI Mher Jentsch is a 76 year old male with a history of hypertension, hiatal hernia, GERD, BPH who presents to establish care. He was placed on medications for benign prostatic hyperplasia which he stopped taking because "his body did not like it"; is not taking the antihypertensive or reflux medication because he read the side effects "which are dangerous and can kill you".  He continues to have epigastric pain, dyspepsia and feeling of food stuck in his esophagus. He is willing to try medications again today.  He endorses straining during urination and frequency but denies dysuria or hematuria. Would like to have blood tests done including his cholesterol, thyroid, blood count.  Past Medical History:  Diagnosis Date  . Adenomatous colon polyp 01/2009  . Anemia   . Barrett's esophagus without dysplasia 08/2006  . BPH (benign prostatic hypertrophy)   . Cataract   . Chronic headaches   . Colon polyps   . Fatty liver   . GERD (gastroesophageal reflux disease)   . Heartburn   . Hepatic hemangioma   . Hiatal hernia   . Hypertension   . Internal hemorrhoids   . Leukopenia     Past Surgical History:  Procedure Laterality Date  . CATARACT EXTRACTION     Left eye  . COLONOSCOPY      Allergies  Allergen Reactions  . Prilosec [Omeprazole] Itching     Outpatient Medications Prior to Visit  Medication Sig Dispense Refill  . acetaminophen (PAIN RELIEF) 325 MG tablet Take 325 mg by mouth every 6 (six) hours as needed for mild pain, moderate pain, fever or headache. Reported on 02/26/2016    . magnesium hydroxide (MILK OF MAGNESIA) 400 MG/5ML suspension Take by mouth daily as needed for mild constipation. Reported on 02/26/2016    . pneumococcal 23 valent vaccine (PNU-IMMUNE) 25 MCG/0.5ML injection Inject 0.5 mLs into the muscle once.    . Tdap  (BOOSTRIX) 5-2.5-18.5 LF-MCG/0.5 injection Inject 0.5 mLs into the muscle once.    Marland Kitchen amLODipine (NORVASC) 5 MG tablet Take 1 tablet (5 mg total) by mouth daily. (Patient not taking: Reported on 12/25/2016) 30 tablet 3  . mirabegron ER (MYRBETRIQ) 50 MG TB24 tablet TAKE 1 TABLET (50 MG TOTAL) BY MOUTH DAILY. (Patient not taking: Reported on 12/25/2016) 30 tablet 3   No facility-administered medications prior to visit.     ROS Review of Systems  Constitutional: Negative for activity change and appetite change.  HENT: Negative for sinus pressure and sore throat.   Eyes: Negative for visual disturbance.  Respiratory: Negative for cough, chest tightness and shortness of breath.   Cardiovascular: Negative for chest pain and leg swelling.  Gastrointestinal: Positive for abdominal pain. Negative for abdominal distention, constipation and diarrhea.  Endocrine: Negative.   Genitourinary:       See history of present illness  Musculoskeletal: Negative for joint swelling and myalgias.  Skin: Negative for rash.  Allergic/Immunologic: Negative.   Neurological: Negative for weakness, light-headedness and numbness.  Psychiatric/Behavioral: Negative for dysphoric mood and suicidal ideas.    Objective:  BP (!) 153/77 (BP Location: Right Arm, Patient Position: Sitting, Cuff Size: Small)   Pulse 61   Temp 98 F (36.7 C) (Oral)   Ht 5\' 10"  (1.778 m)   Wt 179 lb 3.2 oz (81.3 kg)   SpO2 100%   BMI 25.71 kg/m  BP/Weight 12/25/2016 10/17/2016 [id number]  Systolic BP 0000000 - -  Diastolic BP 77 - -  Wt. (Lbs) 179.2 175 175  BMI 25.71 24.41 24.41      Physical Exam  Constitutional: He is oriented to person, place, and time. He appears well-developed and well-nourished.  Cardiovascular: Normal rate, normal heart sounds and intact distal pulses.   No murmur heard. Pulmonary/Chest: Effort normal and breath sounds normal. He has no wheezes. He has no rales. He exhibits no tenderness.  Abdominal: Soft.  Bowel sounds are normal. He exhibits no distension and no mass. There is no tenderness.  Musculoskeletal: Normal range of motion.  Neurological: He is alert and oriented to person, place, and time.     Assessment & Plan:   1. Gastroesophageal reflux disease with esophagitis Placed on PPI If symptoms persist, he may need GI referral given underlying history of hiatal hernia. - CBC with Differential/Platelet; Future - famotidine (PEPCID) 20 MG tablet; Take 1 tablet (20 mg total) by mouth 2 (two) times daily.  Dispense: 30 tablet; Refill: 3  2. Benign prostatic hyperplasia (BPH) with straining on urination Symptomatic - PSA; Future - tamsulosin (FLOMAX) 0.4 MG CAPS capsule; Take 1 capsule (0.4 mg total) by mouth daily.  Dispense: 30 capsule; Refill: 3  3. Essential hypertension Uncontrolled Low-sodium - COMPLETE METABOLIC PANEL WITH GFR; Future - Lipid Panel w/reflex Direct LDL; Future - TSH; Future - losartan (COZAAR) 50 MG tablet; Take 1 tablet (50 mg total) by mouth daily.  Dispense: 30 tablet; Refill: 3  4. Screening for metabolic disorder - Hemoglobin A1c; Future  5. Hiatal hernia Explain GI symptoms We'll try PPI prior to referral to GI   Meds ordered this encounter  Medications  . losartan (COZAAR) 50 MG tablet    Sig: Take 1 tablet (50 mg total) by mouth daily.    Dispense:  30 tablet    Refill:  3  . tamsulosin (FLOMAX) 0.4 MG CAPS capsule    Sig: Take 1 capsule (0.4 mg total) by mouth daily.    Dispense:  30 capsule    Refill:  3  . famotidine (PEPCID) 20 MG tablet    Sig: Take 1 tablet (20 mg total) by mouth 2 (two) times daily.    Dispense:  30 tablet    Refill:  3    Follow-up: Return in about 3 weeks (around 01/15/2017) for follow up on hypertension.   Arnoldo Morale MD

## 2016-12-25 NOTE — Patient Instructions (Signed)
Hypertension Hypertension, commonly called high blood pressure, is when the force of blood pumping through your arteries is too strong. Your arteries are the blood vessels that carry blood from your heart throughout your body. A blood pressure reading consists of a higher number over a lower number, such as 110/72. The higher number (systolic) is the pressure inside your arteries when your heart pumps. The lower number (diastolic) is the pressure inside your arteries when your heart relaxes. Ideally you want your blood pressure below 120/80. Hypertension forces your heart to work harder to pump blood. Your arteries may become narrow or stiff. Having untreated or uncontrolled hypertension can cause heart attack, stroke, kidney disease, and other problems. What increases the risk? Some risk factors for high blood pressure are controllable. Others are not. Risk factors you cannot control include:  Race. You may be at higher risk if you are African American.  Age. Risk increases with age.  Gender. Men are at higher risk than women before age 45 years. After age 65, women are at higher risk than men. Risk factors you can control include:  Not getting enough exercise or physical activity.  Being overweight.  Getting too much fat, sugar, calories, or salt in your diet.  Drinking too much alcohol. What are the signs or symptoms? Hypertension does not usually cause signs or symptoms. Extremely high blood pressure (hypertensive crisis) may cause headache, anxiety, shortness of breath, and nosebleed. How is this diagnosed? To check if you have hypertension, your health care provider will measure your blood pressure while you are seated, with your arm held at the level of your heart. It should be measured at least twice using the same arm. Certain conditions can cause a difference in blood pressure between your right and left arms. A blood pressure reading that is higher than normal on one occasion does  not mean that you need treatment. If it is not clear whether you have high blood pressure, you may be asked to return on a different day to have your blood pressure checked again. Or, you may be asked to monitor your blood pressure at home for 1 or more weeks. How is this treated? Treating high blood pressure includes making lifestyle changes and possibly taking medicine. Living a healthy lifestyle can help lower high blood pressure. You may need to change some of your habits. Lifestyle changes may include:  Following the DASH diet. This diet is high in fruits, vegetables, and whole grains. It is low in salt, red meat, and added sugars.  Keep your sodium intake below 2,300 mg per day.  Getting at least 30-45 minutes of aerobic exercise at least 4 times per week.  Losing weight if necessary.  Not smoking.  Limiting alcoholic beverages.  Learning ways to reduce stress. Your health care provider may prescribe medicine if lifestyle changes are not enough to get your blood pressure under control, and if one of the following is true:  You are 18-59 years of age and your systolic blood pressure is above 140.  You are 60 years of age or older, and your systolic blood pressure is above 150.  Your diastolic blood pressure is above 90.  You have diabetes, and your systolic blood pressure is over 140 or your diastolic blood pressure is over 90.  You have kidney disease and your blood pressure is above 140/90.  You have heart disease and your blood pressure is above 140/90. Your personal target blood pressure may vary depending on your medical   conditions, your age, and other factors. Follow these instructions at home:  Have your blood pressure rechecked as directed by your health care provider.  Take medicines only as directed by your health care provider. Follow the directions carefully. Blood pressure medicines must be taken as prescribed. The medicine does not work as well when you skip  doses. Skipping doses also puts you at risk for problems.  Do not smoke.  Monitor your blood pressure at home as directed by your health care provider. Contact a health care provider if:  You think you are having a reaction to medicines taken.  You have recurrent headaches or feel dizzy.  You have swelling in your ankles.  You have trouble with your vision. Get help right away if:  You develop a severe headache or confusion.  You have unusual weakness, numbness, or feel faint.  You have severe chest or abdominal pain.  You vomit repeatedly.  You have trouble breathing. This information is not intended to replace advice given to you by your health care provider. Make sure you discuss any questions you have with your health care provider. Document Released: 10/20/2005 Document Revised: 03/27/2016 Document Reviewed: 08/12/2013 Elsevier Interactive Patient Education  2017 [employer]  

## 2016-12-25 NOTE — Progress Notes (Signed)
Not taking any meds Patient was placed on "diabetes meds" but only took one pill and didn't like it so he stopped taking it

## 2016-12-26 ENCOUNTER — Ambulatory Visit (INDEPENDENT_AMBULATORY_CARE_PROVIDER_SITE_OTHER): Payer: Medicare Other | Admitting: Podiatry

## 2016-12-26 ENCOUNTER — Ambulatory Visit: Payer: Medicare Other | Attending: Internal Medicine

## 2016-12-26 DIAGNOSIS — M216X9 Other acquired deformities of unspecified foot: Secondary | ICD-10-CM

## 2016-12-26 DIAGNOSIS — R3916 Straining to void: Secondary | ICD-10-CM | POA: Diagnosis not present

## 2016-12-26 DIAGNOSIS — K2100 Gastro-esophageal reflux disease with esophagitis, without bleeding: Secondary | ICD-10-CM

## 2016-12-26 DIAGNOSIS — Z13228 Encounter for screening for other metabolic disorders: Secondary | ICD-10-CM | POA: Diagnosis not present

## 2016-12-26 DIAGNOSIS — M79676 Pain in unspecified toe(s): Secondary | ICD-10-CM | POA: Diagnosis not present

## 2016-12-26 DIAGNOSIS — Z79899 Other long term (current) drug therapy: Secondary | ICD-10-CM | POA: Diagnosis not present

## 2016-12-26 DIAGNOSIS — I10 Essential (primary) hypertension: Secondary | ICD-10-CM | POA: Insufficient documentation

## 2016-12-26 DIAGNOSIS — N401 Enlarged prostate with lower urinary tract symptoms: Secondary | ICD-10-CM | POA: Diagnosis not present

## 2016-12-26 DIAGNOSIS — Q828 Other specified congenital malformations of skin: Secondary | ICD-10-CM

## 2016-12-26 DIAGNOSIS — B351 Tinea unguium: Secondary | ICD-10-CM | POA: Diagnosis not present

## 2016-12-26 LAB — HEMOGLOBIN A1C
Hgb A1c MFr Bld: 5.5 % (ref <5.7)
MEAN PLASMA GLUCOSE: 111 mg/dL

## 2016-12-26 LAB — CBC WITH DIFFERENTIAL/PLATELET
BASOS ABS: 0 {cells}/uL (ref 0–200)
Basophils Relative: 0 %
EOS ABS: 0 {cells}/uL — AB (ref 15–500)
Eosinophils Relative: 0 %
HCT: 41.9 % (ref 38.5–50.0)
Hemoglobin: 13.9 g/dL (ref 13.2–17.1)
LYMPHS PCT: 37 %
Lymphs Abs: 1258 cells/uL (ref 850–3900)
MCH: 31.5 pg (ref 27.0–33.0)
MCHC: 33.2 g/dL (ref 32.0–36.0)
MCV: 95 fL (ref 80.0–100.0)
MPV: 9.7 fL (ref 7.5–12.5)
Monocytes Absolute: 442 cells/uL (ref 200–950)
Monocytes Relative: 13 %
NEUTROS PCT: 50 %
Neutro Abs: 1700 cells/uL (ref 1500–7800)
PLATELETS: 309 10*3/uL (ref 140–400)
RBC: 4.41 MIL/uL (ref 4.20–5.80)
RDW: 15.4 % — AB (ref 11.0–15.0)
WBC: 3.4 10*3/uL — ABNORMAL LOW (ref 3.8–10.8)

## 2016-12-26 LAB — LIPID PANEL W/REFLEX DIRECT LDL
Cholesterol: 168 mg/dL (ref <200)
HDL: 73 mg/dL (ref >40)
LDL-CHOLESTEROL: 81 mg/dL
Non-HDL Cholesterol (Calc): 95 mg/dL (ref <130)
TRIGLYCERIDES: 59 mg/dL (ref <150)
Total CHOL/HDL Ratio: 2.3 Ratio (ref <5.0)

## 2016-12-26 LAB — COMPLETE METABOLIC PANEL WITH GFR
ALT: 12 U/L (ref 9–46)
AST: 16 U/L (ref 10–35)
Albumin: 4.2 g/dL (ref 3.6–5.1)
Alkaline Phosphatase: 22 U/L — ABNORMAL LOW (ref 40–115)
BILIRUBIN TOTAL: 0.7 mg/dL (ref 0.2–1.2)
BUN: 11 mg/dL (ref 7–25)
CO2: 27 mmol/L (ref 20–31)
Calcium: 9.3 mg/dL (ref 8.6–10.3)
Chloride: 104 mmol/L (ref 98–110)
Creat: 0.95 mg/dL (ref 0.70–1.18)
GFR, EST NON AFRICAN AMERICAN: 79 mL/min (ref >=60)
Glucose, Bld: 108 mg/dL — ABNORMAL HIGH (ref 65–99)
POTASSIUM: 4.6 mmol/L (ref 3.5–5.3)
Sodium: 138 mmol/L (ref 135–146)
TOTAL PROTEIN: 6.9 g/dL (ref 6.1–8.1)

## 2016-12-26 LAB — TSH: TSH: 1.38 mIU/L (ref 0.40–4.50)

## 2016-12-26 NOTE — Progress Notes (Signed)
Patient ID: Kenneth Roberts, male   DOB: [birth year], 76 y.o.   MRN: [id number] Complaint:  Visit Type: Patient returns to my office for continued preventative foot care services. Complaint: Patient states" my nails have grown long and thick and become painful to walk and wear shoes" . The patient presents for preventative foot care services. No changes to ROS  Podiatric Exam: Vascular: dorsalis pedis and posterior tibial pulses are palpable bilateral. Capillary return is immediate. Temperature gradient is WNL. Skin turgor WNL  Sensorium: Normal Semmes Weinstein monofilament test. Normal tactile sensation bilaterally. Nail Exam: Pt has thick disfigured discolored nails with subungual debris noted bilateral entire nail hallux through fifth toenails Ulcer Exam: There is no evidence of ulcer or pre-ulcerative changes or infection. Orthopedic Exam: Muscle tone and strength are WNL. No limitations in general ROM. No crepitus or effusions noted. Foot type and digits show no abnormalities. Bony prominences are unremarkable. Skin: No Porokeratosis. No infection or ulcers.  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  Diagnosis:  Onychomycosis, , Pain in right toe, pain in left toes,  Porokeratosis  Treatment & Plan Procedures and Treatment: Consent by patient was obtained for treatment procedures. The patient understood the discussion of treatment and procedures well. All questions were answered thoroughly reviewed. Debridement of mycotic and hypertrophic toenails, 1 through 5 bilateral and clearing of subungual debris. No ulceration, no infection noted. Debride porokeratosis sub 5th met B/l Return Visit-Office Procedure: Patient instructed to return to the office for a follow up visit 10 weeks  for continued evaluation and treatment.   Gardiner Barefoot DPM

## 2016-12-26 NOTE — Progress Notes (Signed)
Patient here for lab visit only 

## 2016-12-27 LAB — PSA: PSA: 0.4 ng/mL (ref <=4.0)

## 2017-01-01 ENCOUNTER — Telehealth: Payer: Self-pay

## 2017-01-01 NOTE — Telephone Encounter (Signed)
-----   Message from Arnoldo Morale, MD sent at 12/29/2016 12:28 PM EST ----- Please inform the patient that labs are normal. Thank you.

## 2017-01-01 NOTE — Telephone Encounter (Signed)
Writer called patient and discussed lab results.  Patient requesting copies.  Writer sent him all the copies.

## 2017-01-15 ENCOUNTER — Encounter: Payer: Self-pay | Admitting: Family Medicine

## 2017-01-15 ENCOUNTER — Ambulatory Visit: Payer: Medicare Other | Attending: Family Medicine | Admitting: Family Medicine

## 2017-01-15 VITALS — BP 139/86 | HR 59 | Temp 98.0°F | Ht 70.0 in | Wt 178.2 lb

## 2017-01-15 DIAGNOSIS — N400 Enlarged prostate without lower urinary tract symptoms: Secondary | ICD-10-CM | POA: Insufficient documentation

## 2017-01-15 DIAGNOSIS — I10 Essential (primary) hypertension: Secondary | ICD-10-CM

## 2017-01-15 DIAGNOSIS — K449 Diaphragmatic hernia without obstruction or gangrene: Secondary | ICD-10-CM

## 2017-01-15 DIAGNOSIS — K219 Gastro-esophageal reflux disease without esophagitis: Secondary | ICD-10-CM | POA: Insufficient documentation

## 2017-01-15 DIAGNOSIS — Z79899 Other long term (current) drug therapy: Secondary | ICD-10-CM | POA: Insufficient documentation

## 2017-01-15 DIAGNOSIS — K2270 Barrett's esophagus without dysplasia: Secondary | ICD-10-CM | POA: Diagnosis not present

## 2017-01-15 NOTE — Progress Notes (Signed)
Subjective:  Patient ID: Kenneth Roberts, male    DOB: 06/11/1941  Age: 75 y.o. MRN: [id number]  CC: Hypertension and Abdominal Pain   HPI Kenneth Roberts this is a 76 year old male with history of hypertension, BPH, GERD who presents today for follow-up on hypertension. His blood pressure was elevated at his last visit and he had been out of his antihypertensives but his blood pressure is better today and is controlled.  He continues to complain of epigastric pain and the fact that his food just sits in his upper abdomen; he has been compliant with his famotidine. Endoscopy from/2016 revealed a reducible hiatal hernia, biopsies were taken to rule out Barrett's esophagus and pathology revealed no metaplasia or dysplasia.  Past Medical History:  Diagnosis Date  . Adenomatous colon polyp 01/2009  . Anemia   . Barrett's esophagus without dysplasia 08/2006  . BPH (benign prostatic hypertrophy)   . Cataract   . Chronic headaches   . Colon polyps   . Fatty liver   . GERD (gastroesophageal reflux disease)   . Heartburn   . Hepatic hemangioma   . Hiatal hernia   . Hypertension   . Internal hemorrhoids   . Leukopenia     Past Surgical History:  Procedure Laterality Date  . CATARACT EXTRACTION     Left eye  . COLONOSCOPY      Allergies  Allergen Reactions  . Prilosec [Omeprazole] Itching     Outpatient Medications Prior to Visit  Medication Sig Dispense Refill  . famotidine (PEPCID) 20 MG tablet Take 1 tablet (20 mg total) by mouth 2 (two) times daily. 30 tablet 3  . losartan (COZAAR) 50 MG tablet Take 1 tablet (50 mg total) by mouth daily. 30 tablet 3  . tamsulosin (FLOMAX) 0.4 MG CAPS capsule Take 1 capsule (0.4 mg total) by mouth daily. 30 capsule 3  . acetaminophen (PAIN RELIEF) 325 MG tablet Take 325 mg by mouth every 6 (six) hours as needed for mild pain, moderate pain, fever or headache. Reported on 02/26/2016    . magnesium hydroxide (MILK OF MAGNESIA) 400 MG/5ML  suspension Take by mouth daily as needed for mild constipation. Reported on 02/26/2016    . pneumococcal 23 valent vaccine (PNU-IMMUNE) 25 MCG/0.5ML injection Inject 0.5 mLs into the muscle once.    . Tdap (BOOSTRIX) 5-2.5-18.5 LF-MCG/0.5 injection Inject 0.5 mLs into the muscle once.     No facility-administered medications prior to visit.     ROS Review of Systems  Constitutional: Negative for activity change and appetite change.  HENT: Negative for sinus pressure and sore throat.   Respiratory: Negative for chest tightness, shortness of breath and wheezing.   Cardiovascular: Negative for chest pain and palpitations.  Gastrointestinal: Positive for abdominal distention. Negative for abdominal pain and constipation.  Genitourinary: Negative.   Musculoskeletal: Negative.   Psychiatric/Behavioral: Negative for behavioral problems and dysphoric mood.    Objective:  BP 139/86 (BP Location: Right Arm, Patient Position: Sitting, Cuff Size: Large)   Pulse (!) 59   Temp 98 F (36.7 C) (Oral)   Ht 5\' 10"  (1.778 m)   Wt 178 lb 3.2 oz (80.8 kg)   SpO2 99%   BMI 25.57 kg/m   BP/Weight 01/15/2017 12/25/2016 25/42/7062  Systolic BP 376 283 -  Diastolic BP 86 77 -  Wt. (Lbs) 178.2 179.2 175  BMI 25.57 25.71 24.41      Physical Exam  Constitutional: He is oriented to person, place, and time. He appears well-developed  and well-nourished.  Cardiovascular: Normal rate, normal heart sounds and intact distal pulses.   No murmur heard. Pulmonary/Chest: Effort normal and breath sounds normal. He has no wheezes. He has no rales. He exhibits no tenderness.  Abdominal: Soft. Bowel sounds are normal. He exhibits no distension and no mass. There is tenderness (slight epigastric tenderness).  Musculoskeletal: Normal range of motion.  Neurological: He is alert and oriented to person, place, and time.     Assessment & Plan:   1. Hiatal hernia Symptoms of reflux still persist Continue PPI Will  need to be evaluated for possible Barrett's - Ambulatory referral to Gastroenterology  2. Essential hypertension Controlled Low-sodium diet   No orders of the defined types were placed in this encounter.   Follow-up: Return in about 3 months (around 04/17/2017) for Follow-up of hypertension.   Arnoldo Morale MD

## 2017-01-15 NOTE — Patient Instructions (Signed)
Hiatal Hernia A hiatal hernia occurs when part of the stomach slides above the muscle that separates the abdomen from the chest (diaphragm). A person can be born with a hiatal hernia (congenital), or it may develop over time. In almost all cases of hiatal hernia, only the top part of the stomach pushes through the diaphragm. Many people have a hiatal hernia with no symptoms. The larger the hernia, the more likely it is that you will have symptoms. In some cases, a hiatal hernia allows stomach acid to flow back into the tube that carries food from your mouth to your stomach (esophagus). This may cause heartburn symptoms. Severe heartburn symptoms may mean that you have developed a condition called gastroesophageal reflux disease (GERD). What are the causes? This condition is caused by a weakness in the opening (hiatus) where the esophagus passes through the diaphragm to attach to the upper part of the stomach. A person may be born with a weakness in the hiatus, or a weakness can develop over time. What increases the risk? This condition is more likely to develop in:  Older people. Age is a major risk factor for a hiatal hernia, especially if you are over the age of 47.  Pregnant women.  People who are overweight.  People who have frequent constipation. What are the signs or symptoms? Symptoms of this condition usually develop in the form of GERD symptoms. Symptoms include:  Heartburn.  Belching.  Indigestion.  Trouble swallowing.  Coughing or wheezing.  Sore throat.  Hoarseness.  Chest pain.  Nausea and vomiting. How is this diagnosed? This condition may be diagnosed during testing for GERD. Tests that may be done include:  X-rays of your stomach or chest.  An upper gastrointestinal (GI) series. This is an X-ray exam of your GI tract that is taken after you swallow a chalky liquid that shows up clearly on the X-ray.  Endoscopy. This is a procedure to look into your stomach  using a thin, flexible tube that has a tiny camera and light on the end of it. How is this treated? This condition may be treated by:  Dietary and lifestyle changes to help reduce GERD symptoms.  Medicines. These may include:  Over-the-counter antacids.  Medicines that make your stomach empty more quickly.  Medicines that block the production of stomach acid (H2 blockers).  Stronger medicines to reduce stomach acid (proton pump inhibitors).  Surgery to repair the hernia, if other treatments are not helping. If you have no symptoms, you may not need treatment. Follow these instructions at home: Lifestyle and activity   Do not use any products that contain nicotine or tobacco, such as cigarettes and e-cigarettes. If you need help quitting, ask your health care provider.  Try to achieve and maintain a healthy body weight.  Avoid putting pressure on your abdomen. Anything that puts pressure on your abdomen increases the amount of acid that may be pushed up into your esophagus.  Avoid bending over, especially after eating.  Raise the head of your bed by putting blocks under the legs. This keeps your head and esophagus higher than your stomach.  Do not wear tight clothing around your chest or stomach.  Try not to strain when having a bowel movement, when urinating, or when lifting heavy objects. Eating and drinking   Avoid foods that can worsen GERD symptoms. These may include:  Fatty foods, like fried foods.  Citrus fruits, like oranges or lemon.  Other foods and drinks that contain acid,  like orange juice or tomatoes.  Spicy food.  Chocolate.  Eat frequent small meals instead of three large meals a day. This helps prevent your stomach from getting too full.  Eat slowly.  Do not lie down right after eating.  Do not eat 1-2 hours before bed.  Do not drink beverages with caffeine. These include cola, coffee, cocoa, and tea.  Do not drink alcohol. General  instructions   Take over-the-counter and prescription medicines only as told by your health care provider.  Keep all follow-up visits as told by your health care provider. This is important. Contact a health care provider if:  Your symptoms are not controlled with medicines or lifestyle changes.  You are having trouble swallowing.  You have coughing or wheezing that will not go away. Get help right away if:  Your pain is getting worse.  Your pain spreads to your arms, neck, jaw, teeth, or back.  You have shortness of breath.  You sweat for no reason.  You feel sick to your stomach (nauseous) or you vomit.  You vomit blood.  You have bright red blood in your stools.  You have black, tarry stools. This information is not intended to replace advice given to you by your health care provider. Make sure you discuss any questions you have with your health care provider. Document Released: 01/10/2004 Document Revised: 10/13/2016 Document Reviewed: 10/13/2016 Elsevier Interactive Patient Education  2017 [employer].

## 2017-02-19 ENCOUNTER — Encounter (INDEPENDENT_AMBULATORY_CARE_PROVIDER_SITE_OTHER): Payer: Self-pay

## 2017-02-19 ENCOUNTER — Encounter: Payer: Self-pay | Admitting: Gastroenterology

## 2017-02-19 ENCOUNTER — Other Ambulatory Visit: Payer: Self-pay

## 2017-02-19 ENCOUNTER — Ambulatory Visit (INDEPENDENT_AMBULATORY_CARE_PROVIDER_SITE_OTHER): Payer: Medicare Other | Admitting: Gastroenterology

## 2017-02-19 VITALS — BP 132/74 | HR 84 | Ht 69.5 in | Wt 179.0 lb

## 2017-02-19 DIAGNOSIS — R0989 Other specified symptoms and signs involving the circulatory and respiratory systems: Secondary | ICD-10-CM

## 2017-02-19 DIAGNOSIS — F458 Other somatoform disorders: Secondary | ICD-10-CM

## 2017-02-19 DIAGNOSIS — R1084 Generalized abdominal pain: Secondary | ICD-10-CM

## 2017-02-19 DIAGNOSIS — R1310 Dysphagia, unspecified: Secondary | ICD-10-CM | POA: Diagnosis not present

## 2017-02-19 DIAGNOSIS — R140 Abdominal distension (gaseous): Secondary | ICD-10-CM | POA: Diagnosis not present

## 2017-02-19 MED ORDER — DESIPRAMINE HCL 10 MG PO TABS: 10.0000 mg | ORAL_TABLET | Freq: Every day | ORAL | 0 refills | Status: DC

## 2017-02-19 MED ORDER — DICYCLOMINE HCL 10 MG PO CAPS: 10.0000 mg | ORAL_CAPSULE | Freq: Three times a day (TID) | ORAL | 1 refills | Status: DC | PRN

## 2017-02-19 NOTE — Progress Notes (Signed)
HPI :  76 year old male well-known to our clinic, previously followed by Dr. Fuller Plan and Dr. Olevia Perches over the years, with chronic symptoms of globus, chronic abdominal discomfort in the upper and lower abdomen with chronic bloating. See prior clinic notes for details of his case. He's had an extensive evaluation to date with prior EGD, colonoscopy, capsule study, and multiple CTs without any significant etiology noted. Prior celiac testing negative TTG but mildly positive anti-gliadin AB. I had recommended HLA testing to more definitively rule out celiac but he did not follow up for this since the last visit.   At the last visit I recommended a referral from to see the ENT physician for his globus, he did this and was told he had a normal exam without any cause for his symptoms. I recommended a trial of FD guard which I don't believe he tried.   He has a sense of globus ongoing which "won't go down", he feels it constantly. When he swallows, he has some rare dysphagia in his chest which has been ongoing for a long time. He feels "things don't go down correctly".  He has ongoing chronic bloating in his abdomen and discomfort. He feels it most of the time. Eating does not make it worse. He has a sense of distension and bloating most of the time. No nausea or vomiting. He reports normal bowel function. No constipation or diarrhea which routinely bothers him. No blood in the stools. Having a bowel movement can make his abdomen feel better. He is not taking any medications for reflux. He denies any burning in his chest or heartburn. He had been on omeprazole in the past without benefit.   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 Adenomatous colon polyp 01/2009  . Anemia   . BPH (benign prostatic hypertrophy)   . Cataract   . Chronic headaches   . Colon polyps   . Fatty liver   . GERD (gastroesophageal reflux disease)   . Heartburn   . Hepatic hemangioma   . Hiatal hernia   . Hypertension   . Internal hemorrhoids   . Leukopenia      Past Surgical History:  Procedure Laterality Date  . CATARACT EXTRACTION     Left eye  . COLONOSCOPY     Family History  Problem Relation Age of Onset  . Stomach cancer Mother   . Diabetes Father   . Diabetes Brother   . Diabetes Sister   . Colon cancer Neg Hx   . Colon polyps Neg Hx   . Esophageal cancer Neg Hx    Social History  Substance Use Topics  . Smoking status: Former Smoker    Quit date: 02/16/1981  . Smokeless tobacco: Never Used  . Alcohol use 1.2 - 2.4 oz/week    2 - 4 Glasses of wine per week     Comment: 4-5 drinks week   Current Outpatient Prescriptions  Medication Sig Dispense Refill  . acetaminophen (PAIN RELIEF) 325 MG tablet Take 325 mg by mouth every 6 (  six) hours as needed for mild pain, moderate pain, fever or headache. Reported on 02/26/2016    . losartan (COZAAR) 50 MG tablet Take 1 tablet (50 mg total) by mouth daily. (Patient taking differently: Take 50 mg by mouth. Twice a week) 30 tablet 3  . desipramine (NOPRAMIN) 10 MG tablet Take 1 tablet (10 mg total) by mouth at bedtime. Increase to two tabs qhs thereafter 60 tablet 0  . dicyclomine (BENTYL) 10 MG capsule Take 1 capsule (10 mg total) by mouth every 8 (eight) hours as needed for spasms. 90 capsule 1   No current facility-administered medications for this visit.    Allergies  Allergen Reactions  . Prilosec [Omeprazole] Itching     Review of Systems: All systems reviewed and negative  except where noted in HPI.    Lab Results  Component Value Date   WBC 3.4 (L) 12/26/2016   HGB 13.9 12/26/2016   HCT 41.9 12/26/2016   MCV 95.0 12/26/2016   PLT 309 12/26/2016    Lab Results  Component Value Date   CREATININE 0.95 12/26/2016   BUN 11 12/26/2016   NA 138 12/26/2016   K 4.6 12/26/2016   CL 104 12/26/2016   CO2 27 12/26/2016    Lab Results  Component Value Date   ALT 12 12/26/2016   AST 16 12/26/2016   ALKPHOS 22 (L) 12/26/2016   BILITOT 0.7 12/26/2016     Physical Exam: BP 132/74   Pulse 84   Ht 5' 9.5" (1.765 m)   Wt 179 lb (81.2 kg)   BMI 26.05 kg/m  Constitutional: Pleasant,well-developed, male in no acute distress. HEENT: Normocephalic and atraumatic. Conjunctivae are normal. No scleral icterus. Neck supple.  Cardiovascular: Normal rate, regular rhythm.  Pulmonary/chest: Effort normal and breath sounds normal. No wheezing, rales or rhonchi. Abdominal: Soft, nondistended, nontender. There are no masses palpable. No hepatomegaly. Extremities: no edema Lymphadenopathy: No cervical adenopathy noted. Neurological: Alert and oriented to person place and time. Skin: Skin is warm and dry. No rashes noted. Psychiatric: Normal mood and affect. Behavior is normal.   ASSESSMENT AND PLAN: 76 year old male here for reassessment of the following issues:  Globus / dysphagia - both of these symptoms are chronic, ongoing for years.  I discussed differential for globus with him, and reassured him no evidence of malignancy or significant pathology based on prior EGDs and ENT evaluation. 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 It's possible his dysphagia could be coming from extrinsic compression from the aortic arch, however it seems mild and not impairing his nutrition. It also possible he has nonspecific esophageal dysmotility causing his globus. I offered him esophageal manometry and described  what this was, I don't think he has achalasia or any significant abdominal dysmotility, but if he wanted a formal evaluation esophageal manometry is the next step. I also discussed management options for nonspecific dysmotility and chronic globus, given his other GI symptoms I think he may benefit from a tricyclic. He wanted to try treatment with tricyclic rather than go through manometry at this point which is reasonable. Discussed the risks/benefits/side effects of desipramine with him. Recommend 10 mg daily at bedtime for one week then titrate to 20 mg daily at bedtime thereafter counseled this may take 4-6 weeks to show benefit. I asked him to call me in 4-6 weeks and let me know how he is doing.  Chronic bloating / abdominal pain - chronic symptoms,  reassured him endoscopies and CT scans have not shown any significant pathology. He likely has functional symptoms / IBS. We are starting desipramine as outlined above. Will also give him bentyl '10mg'$  to use every 8 hours as needed for spasm, and counseled him on a low FODMAP diet see if this helps.   He agreed with the plan, all questions answered.  Floodwood Cellar, MD [facility] Pager [phone]

## 2017-02-19 NOTE — Patient Instructions (Addendum)
If you are age 74 or older, your body mass index should be between 23-30. Your Body mass index is 26.05 kg/m. If this is out of the aforementioned range listed, please consider follow up with your Primary Care Provider.  If you are age 66 or younger, your body mass index should be between 19-25. Your Body mass index is 26.05 kg/m. If this is out of the aformentioned range listed, please consider follow up with your Primary Care Provider.   We have sent the following medications to your pharmacy for you to pick up at your convenience:  Bentyl  Desipramine  You have been given a Low FodMap diet to follow.  Please follow up as needed with Dr. Havery Moros.  Thank you.

## 2017-02-25 DIAGNOSIS — H47013 Ischemic optic neuropathy, bilateral: Secondary | ICD-10-CM | POA: Diagnosis not present

## 2017-02-25 DIAGNOSIS — H35033 Hypertensive retinopathy, bilateral: Secondary | ICD-10-CM | POA: Diagnosis not present

## 2017-02-25 DIAGNOSIS — H40023 Open angle with borderline findings, high risk, bilateral: Secondary | ICD-10-CM | POA: Diagnosis not present

## 2017-03-27 ENCOUNTER — Ambulatory Visit: Payer: Medicare Other | Admitting: Podiatry

## 2017-04-02 ENCOUNTER — Telehealth: Payer: Self-pay | Admitting: Gastroenterology

## 2017-04-02 ENCOUNTER — Other Ambulatory Visit: Payer: Self-pay

## 2017-04-02 MED ORDER — POLYETHYLENE GLYCOL 3350 17 GM/SCOOP PO POWD: 1.0000 | Freq: Every day | ORAL | 3 refills | Status: DC

## 2017-04-02 NOTE — Telephone Encounter (Signed)
Yes that's fine, no problem. Thanks

## 2017-04-02 NOTE — Telephone Encounter (Signed)
Filled as directed by Dr Havery Moros. Pt notified and aware.

## 2017-04-02 NOTE — Telephone Encounter (Signed)
Patient is requesting RX for Miralax. Okay to give?

## 2017-04-10 ENCOUNTER — Ambulatory Visit (INDEPENDENT_AMBULATORY_CARE_PROVIDER_SITE_OTHER): Payer: Medicare Other | Admitting: Podiatry

## 2017-04-10 DIAGNOSIS — M79676 Pain in unspecified toe(s): Secondary | ICD-10-CM | POA: Diagnosis not present

## 2017-04-10 DIAGNOSIS — M216X9 Other acquired deformities of unspecified foot: Secondary | ICD-10-CM

## 2017-04-10 DIAGNOSIS — Q828 Other specified congenital malformations of skin: Secondary | ICD-10-CM

## 2017-04-10 DIAGNOSIS — B351 Tinea unguium: Secondary | ICD-10-CM

## 2017-04-10 NOTE — Progress Notes (Signed)
Patient ID: Kenneth Roberts, male   DOB: [birth year], 76 y.o.   MRN: [id number] Complaint:  Visit Type: Patient returns to my office for continued preventative foot care services. Complaint: Patient states" my nails have grown long and thick and become painful to walk and wear shoes" . The patient presents for preventative foot care services. No changes to ROS  Podiatric Exam: Vascular: dorsalis pedis and posterior tibial pulses are palpable bilateral. Capillary return is immediate. Temperature gradient is WNL. Skin turgor WNL  Sensorium: Normal Semmes Weinstein monofilament test. Normal tactile sensation bilaterally. Nail Exam: Pt has thick disfigured discolored nails with subungual debris noted bilateral entire nail hallux through fifth toenails Ulcer Exam: There is no evidence of ulcer or pre-ulcerative changes or infection. Orthopedic Exam: Muscle tone and strength are WNL. No limitations in general ROM. No crepitus or effusions noted. Foot type and digits show no abnormalities. Bony prominences are unremarkable. Skin: No Porokeratosis. No infection or ulcers.  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  Diagnosis:  Onychomycosis, , Pain in right toe, pain in left toes,  Porokeratosis  Treatment & Plan Procedures and Treatment: Consent by patient was obtained for treatment procedures. The patient understood the discussion of treatment and procedures well. All questions were answered thoroughly reviewed. Debridement of mycotic and hypertrophic toenails, 1 through 5 bilateral and clearing of subungual debris. No ulceration, no infection noted. Debride porokeratosis sub 5th met B/l Return Visit-Office Procedure: Patient instructed to return to the office for a follow up visit 10 weeks  for continued evaluation and treatment.   Gardiner Barefoot DPM

## 2017-04-23 DIAGNOSIS — Z131 Encounter for screening for diabetes mellitus: Secondary | ICD-10-CM | POA: Diagnosis not present

## 2017-04-23 DIAGNOSIS — N400 Enlarged prostate without lower urinary tract symptoms: Secondary | ICD-10-CM | POA: Diagnosis not present

## 2017-04-23 DIAGNOSIS — Z13220 Encounter for screening for lipoid disorders: Secondary | ICD-10-CM | POA: Diagnosis not present

## 2017-04-23 DIAGNOSIS — I10 Essential (primary) hypertension: Secondary | ICD-10-CM | POA: Diagnosis not present

## 2017-04-23 DIAGNOSIS — H539 Unspecified visual disturbance: Secondary | ICD-10-CM | POA: Diagnosis not present

## 2017-04-23 DIAGNOSIS — H6121 Impacted cerumen, right ear: Secondary | ICD-10-CM | POA: Diagnosis not present

## 2017-05-04 ENCOUNTER — Telehealth: Payer: Self-pay | Admitting: Gastroenterology

## 2017-05-04 NOTE — Telephone Encounter (Signed)
Left message that I was returning his call.

## 2017-05-05 NOTE — Telephone Encounter (Signed)
Called patient back again today, he said that the bentyl worked at first but is now back to stomach bloating/pain. He is wondering what the next step is. Please advise.

## 2017-05-07 ENCOUNTER — Other Ambulatory Visit: Payer: Self-pay

## 2017-05-07 MED ORDER — VSL#3 PO CAPS: 1.0000 | ORAL_CAPSULE | Freq: Two times a day (BID) | ORAL | 3 refills | Status: DC

## 2017-05-07 NOTE — Telephone Encounter (Signed)
We can try a course of VSL#3 - 1 capsule twice per day for the bloating / discomfort, and see if this helps. We can give him a 1 month supply with 3 refills   Otherwise hopefully he is tolerating desipramine and that it is helping his other symptoms. Thanks

## 2017-05-07 NOTE — Telephone Encounter (Signed)
Left detailed message for patient that we have sent in Rx for VSL#3 to take one capsule twice daily. If this does not seem to be helping asked that he contact our office.

## 2017-05-25 ENCOUNTER — Telehealth: Payer: Self-pay | Admitting: Gastroenterology

## 2017-05-25 ENCOUNTER — Other Ambulatory Visit: Payer: Self-pay

## 2017-05-25 DIAGNOSIS — R6881 Early satiety: Secondary | ICD-10-CM

## 2017-05-25 DIAGNOSIS — R109 Unspecified abdominal pain: Secondary | ICD-10-CM

## 2017-05-25 NOTE — Telephone Encounter (Addendum)
Spoke to patient, let him know to stop taking the medications. He states he did stop approximately 1 1/2 weeks ago. Let him know that radiology scheduling will call him to schedule. Let patient know that he can try FD gard in the meantime.

## 2017-05-25 NOTE — Telephone Encounter (Signed)
Okay. Sorry to hear this. If these aren't helping at all he can stop them. I think we should order a gastric emptying study to rule out gastroparesis, if you can coordinate. He should be off desipramine a few weeks prior to starting this. Has he tried FD gard yet? He can try this in the interim. We may consider a course of empiric rifaximin as well pending his course. thanks

## 2017-05-25 NOTE — Telephone Encounter (Signed)
Patient tried taking VSL#3, desipramine and Bentyl and nothing seems to be helping. He continues to have intermittent abdominal pain. He states that after eating 2-3 bites of food, he feels full and then the pain starts. Please advise.

## 2017-05-29 DIAGNOSIS — H469 Unspecified optic neuritis: Secondary | ICD-10-CM | POA: Diagnosis not present

## 2017-06-03 ENCOUNTER — Other Ambulatory Visit: Payer: Self-pay | Admitting: Ophthalmology

## 2017-06-03 ENCOUNTER — Ambulatory Visit (HOSPITAL_COMMUNITY)
Admission: RE | Admit: 2017-06-03 | Discharge: 2017-06-03 | Disposition: A | Discharge status: Home / Self Care | Payer: Medicare Other | Source: Ambulatory Visit | Attending: Gastroenterology | Admitting: Gastroenterology

## 2017-06-03 DIAGNOSIS — H469 Unspecified optic neuritis: Secondary | ICD-10-CM

## 2017-06-03 DIAGNOSIS — R109 Unspecified abdominal pain: Secondary | ICD-10-CM | POA: Insufficient documentation

## 2017-06-03 DIAGNOSIS — R6881 Early satiety: Secondary | ICD-10-CM

## 2017-06-03 DIAGNOSIS — R140 Abdominal distension (gaseous): Secondary | ICD-10-CM | POA: Diagnosis not present

## 2017-06-03 MED ORDER — TECHNETIUM TC 99M SULFUR COLLOID
2.1000 | Freq: Once | INTRAVENOUS | Status: AC | PRN
  Administered 2017-06-03: 2.1 via INTRAVENOUS

## 2017-06-04 DIAGNOSIS — K5900 Constipation, unspecified: Secondary | ICD-10-CM | POA: Diagnosis not present

## 2017-06-04 DIAGNOSIS — I10 Essential (primary) hypertension: Secondary | ICD-10-CM | POA: Diagnosis not present

## 2017-06-04 DIAGNOSIS — N400 Enlarged prostate without lower urinary tract symptoms: Secondary | ICD-10-CM | POA: Diagnosis not present

## 2017-06-04 DIAGNOSIS — K219 Gastro-esophageal reflux disease without esophagitis: Secondary | ICD-10-CM | POA: Diagnosis not present

## 2017-06-19 ENCOUNTER — Other Ambulatory Visit: Payer: Medicare Other

## 2017-06-19 ENCOUNTER — Ambulatory Visit: Payer: Medicare Other | Admitting: Podiatry

## 2017-06-26 ENCOUNTER — Encounter: Payer: Self-pay | Admitting: Podiatry

## 2017-06-26 ENCOUNTER — Ambulatory Visit (INDEPENDENT_AMBULATORY_CARE_PROVIDER_SITE_OTHER): Payer: Medicare Other | Admitting: Podiatry

## 2017-06-26 DIAGNOSIS — B351 Tinea unguium: Secondary | ICD-10-CM | POA: Diagnosis not present

## 2017-06-26 DIAGNOSIS — M79676 Pain in unspecified toe(s): Secondary | ICD-10-CM

## 2017-06-26 DIAGNOSIS — M216X9 Other acquired deformities of unspecified foot: Secondary | ICD-10-CM

## 2017-06-26 DIAGNOSIS — Q828 Other specified congenital malformations of skin: Secondary | ICD-10-CM

## 2017-06-26 NOTE — Progress Notes (Signed)
Patient ID: Kenneth Roberts, male   DOB: [birth year], 76 y.o.   MRN: [id number] Complaint:  Visit Type: Patient returns to my office for continued preventative foot care services. Complaint: Patient states" my nails have grown long and thick and become painful to walk and wear shoes" . The patient presents for preventative foot care services. No changes to ROS  Podiatric Exam: Vascular: dorsalis pedis and posterior tibial pulses are palpable bilateral. Capillary return is immediate. Temperature gradient is WNL. Skin turgor WNL  Sensorium: Normal Semmes Weinstein monofilament test. Normal tactile sensation bilaterally. Nail Exam: Pt has thick disfigured discolored nails with subungual debris noted bilateral entire nail hallux through fifth toenails Ulcer Exam: There is no evidence of ulcer or pre-ulcerative changes or infection. Orthopedic Exam: Muscle tone and strength are WNL. No limitations in general ROM. No crepitus or effusions noted. Foot type and digits show no abnormalities. Bony prominences are unremarkable. Skin: No Porokeratosis. No infection or ulcers.  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  Diagnosis:  Onychomycosis, , Pain in right toe, pain in left toes,  Porokeratosis  Treatment & Plan Procedures and Treatment: Consent by patient was obtained for treatment procedures. The patient understood the discussion of treatment and procedures well. All questions were answered thoroughly reviewed. Debridement of mycotic and hypertrophic toenails, 1 through 5 bilateral and clearing of subungual debris. No ulceration, no infection noted. Debride porokeratosis sub 5th met B/l Return Visit-Office Procedure: Patient instructed to return to the office for a follow up visit 10 weeks  for continued evaluation and treatment.   Gardiner Barefoot DPM

## 2017-07-03 DIAGNOSIS — H469 Unspecified optic neuritis: Secondary | ICD-10-CM | POA: Diagnosis not present

## 2017-07-07 ENCOUNTER — Ambulatory Visit
Admission: RE | Admit: 2017-07-07 | Discharge: 2017-07-07 | Disposition: A | Discharge status: Home / Self Care | Payer: Medicare Other | Source: Ambulatory Visit | Attending: Ophthalmology | Admitting: Ophthalmology

## 2017-07-07 ENCOUNTER — Other Ambulatory Visit: Payer: Self-pay | Admitting: Ophthalmology

## 2017-07-07 DIAGNOSIS — H469 Unspecified optic neuritis: Secondary | ICD-10-CM

## 2017-07-07 DIAGNOSIS — H47019 Ischemic optic neuropathy, unspecified eye: Secondary | ICD-10-CM | POA: Diagnosis not present

## 2017-07-16 DIAGNOSIS — K219 Gastro-esophageal reflux disease without esophagitis: Secondary | ICD-10-CM | POA: Diagnosis not present

## 2017-07-16 DIAGNOSIS — I10 Essential (primary) hypertension: Secondary | ICD-10-CM | POA: Diagnosis not present

## 2017-07-16 DIAGNOSIS — N400 Enlarged prostate without lower urinary tract symptoms: Secondary | ICD-10-CM | POA: Diagnosis not present

## 2017-07-16 DIAGNOSIS — R109 Unspecified abdominal pain: Secondary | ICD-10-CM | POA: Diagnosis not present

## 2017-07-29 DIAGNOSIS — R109 Unspecified abdominal pain: Secondary | ICD-10-CM | POA: Diagnosis not present

## 2017-08-14 DIAGNOSIS — N3281 Overactive bladder: Secondary | ICD-10-CM | POA: Diagnosis not present

## 2017-08-14 DIAGNOSIS — Z125 Encounter for screening for malignant neoplasm of prostate: Secondary | ICD-10-CM | POA: Diagnosis not present

## 2017-08-20 DIAGNOSIS — Z131 Encounter for screening for diabetes mellitus: Secondary | ICD-10-CM | POA: Diagnosis not present

## 2017-08-20 DIAGNOSIS — N400 Enlarged prostate without lower urinary tract symptoms: Secondary | ICD-10-CM | POA: Diagnosis not present

## 2017-08-20 DIAGNOSIS — K5900 Constipation, unspecified: Secondary | ICD-10-CM | POA: Diagnosis not present

## 2017-08-20 DIAGNOSIS — K219 Gastro-esophageal reflux disease without esophagitis: Secondary | ICD-10-CM | POA: Diagnosis not present

## 2017-08-20 DIAGNOSIS — I10 Essential (primary) hypertension: Secondary | ICD-10-CM | POA: Diagnosis not present

## 2017-09-04 ENCOUNTER — Encounter: Payer: Self-pay | Admitting: Podiatry

## 2017-09-04 ENCOUNTER — Ambulatory Visit (INDEPENDENT_AMBULATORY_CARE_PROVIDER_SITE_OTHER): Payer: Medicare Other | Admitting: Podiatry

## 2017-09-04 DIAGNOSIS — M79676 Pain in unspecified toe(s): Secondary | ICD-10-CM | POA: Diagnosis not present

## 2017-09-04 DIAGNOSIS — B351 Tinea unguium: Secondary | ICD-10-CM | POA: Diagnosis not present

## 2017-09-04 DIAGNOSIS — Q828 Other specified congenital malformations of skin: Secondary | ICD-10-CM

## 2017-09-04 NOTE — Progress Notes (Signed)
Patient ID: Kenneth Roberts, male   DOB: [birth year], 76 y.o.   MRN: [id number] Complaint:  Visit Type: Patient returns to my office for continued preventative foot care services. Complaint: Patient states" my nails have grown long and thick and become painful to walk and wear shoes" . The patient presents for preventative foot care services. No changes to ROS  Podiatric Exam: Vascular: dorsalis pedis and posterior tibial pulses are palpable bilateral. Capillary return is immediate. Temperature gradient is WNL. Skin turgor WNL  Sensorium: Normal Semmes Weinstein monofilament test. Normal tactile sensation bilaterally. Nail Exam: Pt has thick disfigured discolored nails with subungual debris noted bilateral entire nail hallux through fifth toenails Ulcer Exam: There is no evidence of ulcer or pre-ulcerative changes or infection. Orthopedic Exam: Muscle tone and strength are WNL. No limitations in general ROM. No crepitus or effusions noted. Foot type and digits show no abnormalities. Bony prominences are unremarkable. Skin: No Porokeratosis. No infection or ulcers.  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  Diagnosis:  Onychomycosis, , Pain in right toe, pain in left toes,  Porokeratosis  Treatment & Plan Procedures and Treatment: Consent by patient was obtained for treatment procedures. The patient understood the discussion of treatment and procedures well. All questions were answered thoroughly reviewed. Debridement of mycotic and hypertrophic toenails, 1 through 5 bilateral and clearing of subungual debris. No ulceration, no infection noted. Debride porokeratosis sub 5th met B/l Return Visit-Office Procedure: Patient instructed to return to the office for a follow up visit 10 weeks  for continued evaluation and treatment.   Gardiner Barefoot DPM

## 2017-11-13 ENCOUNTER — Encounter: Payer: Self-pay | Admitting: Podiatry

## 2017-11-13 ENCOUNTER — Ambulatory Visit (INDEPENDENT_AMBULATORY_CARE_PROVIDER_SITE_OTHER): Payer: Medicare Other | Admitting: Podiatry

## 2017-11-13 DIAGNOSIS — B351 Tinea unguium: Secondary | ICD-10-CM | POA: Diagnosis not present

## 2017-11-13 DIAGNOSIS — M79676 Pain in unspecified toe(s): Secondary | ICD-10-CM | POA: Diagnosis not present

## 2017-11-13 DIAGNOSIS — Q828 Other specified congenital malformations of skin: Secondary | ICD-10-CM | POA: Diagnosis not present

## 2017-11-13 DIAGNOSIS — M216X9 Other acquired deformities of unspecified foot: Secondary | ICD-10-CM

## 2017-11-13 NOTE — Progress Notes (Signed)
Patient ID: Kenneth Roberts, male   DOB: [birth year], 77 y.o.   MRN: [id number] Complaint:  Visit Type: Patient returns to my office for continued preventative foot care services. Complaint: Patient states" my nails have grown long and thick and become painful to walk and wear shoes" . The patient presents for preventative foot care services. No changes to ROS  Podiatric Exam: Vascular: dorsalis pedis and posterior tibial pulses are palpable bilateral. Capillary return is immediate. Temperature gradient is WNL. Skin turgor WNL  Sensorium: Normal Semmes Weinstein monofilament test. Normal tactile sensation bilaterally. Nail Exam: Pt has thick disfigured discolored nails with subungual debris noted bilateral entire nail hallux through fifth toenails Ulcer Exam: There is no evidence of ulcer or pre-ulcerative changes or infection. Orthopedic Exam: Muscle tone and strength are WNL. No limitations in general ROM. No crepitus or effusions noted. Foot type and digits show no abnormalities. Bony prominences are unremarkable. Skin: No Porokeratosis. No infection or ulcers.  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  Diagnosis:  Onychomycosis, , Pain in right toe, pain in left toes,  Porokeratosis  Treatment & Plan Procedures and Treatment: Consent by patient was obtained for treatment procedures. The patient understood the discussion of treatment and procedures well. All questions were answered thoroughly reviewed. Debridement of mycotic and hypertrophic toenails, 1 through 5 bilateral and clearing of subungual debris. No ulceration, no infection noted. Debride porokeratosis sub 5th met B/l Return Visit-Office Procedure: Patient instructed to return to the office for a follow up visit 10 weeks  for continued evaluation and treatment.   Gardiner Barefoot DPM

## 2018-01-15 ENCOUNTER — Ambulatory Visit: Payer: Medicare Other | Admitting: Podiatry

## 2018-01-20 ENCOUNTER — Ambulatory Visit (INDEPENDENT_AMBULATORY_CARE_PROVIDER_SITE_OTHER): Payer: Medicare Other | Admitting: Podiatry

## 2018-01-20 ENCOUNTER — Encounter: Payer: Self-pay | Admitting: Podiatry

## 2018-01-20 DIAGNOSIS — M216X9 Other acquired deformities of unspecified foot: Secondary | ICD-10-CM

## 2018-01-20 DIAGNOSIS — B351 Tinea unguium: Secondary | ICD-10-CM

## 2018-01-20 DIAGNOSIS — M79676 Pain in unspecified toe(s): Secondary | ICD-10-CM

## 2018-01-20 DIAGNOSIS — Q828 Other specified congenital malformations of skin: Secondary | ICD-10-CM

## 2018-01-20 NOTE — Progress Notes (Signed)
Patient ID: Kenneth Roberts, male   DOB: [birth year], 77 y.o.   MRN: [id number] Complaint:  Visit Type: Patient returns to my office for continued preventative foot care services. Complaint: Patient states" my nails have grown long and thick and become painful to walk and wear shoes" . The patient presents for preventative foot care services. No changes to ROS  Podiatric Exam: Vascular: dorsalis pedis and posterior tibial pulses are palpable bilateral. Capillary return is immediate. Temperature gradient is WNL. Skin turgor WNL  Sensorium: Normal Semmes Weinstein monofilament test. Normal tactile sensation bilaterally. Nail Exam: Pt has thick disfigured discolored nails with subungual debris noted bilateral entire nail hallux through fifth toenails Ulcer Exam: There is no evidence of ulcer or pre-ulcerative changes or infection. Orthopedic Exam: Muscle tone and strength are WNL. No limitations in general ROM. No crepitus or effusions noted. Foot type and digits show no abnormalities. Bony prominences are unremarkable. Skin: No Porokeratosis. No infection or ulcers.  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  Diagnosis:  Onychomycosis, , Pain in right toe, pain in left toes,  Porokeratosis  Treatment & Plan Procedures and Treatment: Consent by patient was obtained for treatment procedures. The patient understood the discussion of treatment and procedures well. All questions were answered thoroughly reviewed. Debridement of mycotic and hypertrophic toenails, 1 through 5 bilateral and clearing of subungual debris. No ulceration, no infection noted. Debride porokeratosis sub 5th met B/l Return Visit-Office Procedure: Patient instructed to return to the office for a follow up visit 10 weeks  for continued evaluation and treatment.   Gardiner Barefoot DPM

## 2018-02-22 IMAGING — MR MR ORBITS W/O CM
14 series · 40 of 48 positions shown · non-contrast
Comparison: Head CT 02/27/2007 and MRI 03/31/2006

CLINICAL DATA: Optic neuropathy.  Increased visual floaters.

EXAM:
MRI HEAD AND ORBITS WITHOUT CONTRAST
TECHNIQUE: Multiplanar, multiecho pulse sequences of the brain and surrounding
structures were obtained without intravenous contrast. Multiplanar,
multiecho pulse sequences of the orbits and surrounding structures
were obtained including fat saturation techniques, without
intravenous contrast administration.

[Series 3: DWI · axial · 3.0mm · 1.80mm/px · z∈[-77,+70]mm · 6 of 100 slices shown (1 of 4)]
[im 1/100]
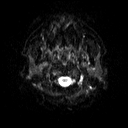
[im 20/100]
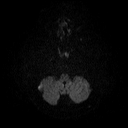
[im 40/100]
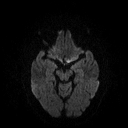
[im 60/100]
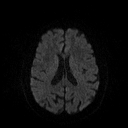
[im 80/100]
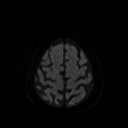
[im 100/100]
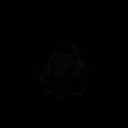

[Series 4: DWI · axial · 3.0mm · 1.80mm/px · z∈[-77,+70]mm · 4 of 49 slices shown (2 of 4)]
[im 1/49]
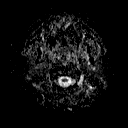
[im 17/49]
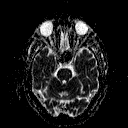
[im 33/49]
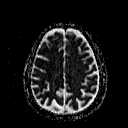
[im 49/49]
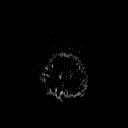

[Series 6: swi_images · axial · 2.0mm · 0.90mm/px · z∈[-82,+76]mm · 6 of 80 slices shown]
[im 1/80]
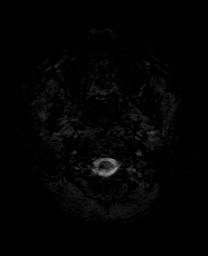
[im 16/80]
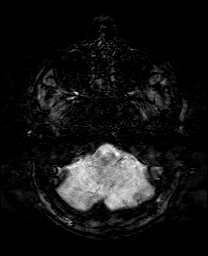
[im 32/80]
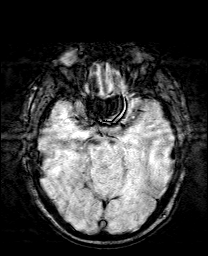
[im 48/80]
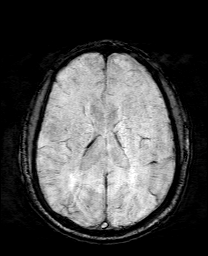
[im 64/80]
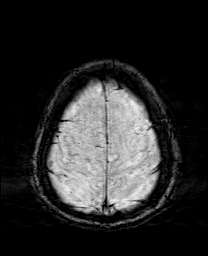
[im 80/80]
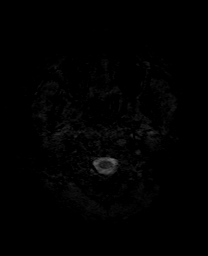

[Series 7: DWI · coronal · 5.0mm · 1.80mm/px · 5 of 68 slices shown (3 of 4)]
[im 1/68]
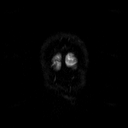
[im 17/68]
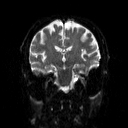
[im 34/68]
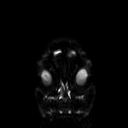
[im 51/68]
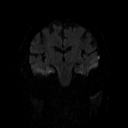
[im 68/68]
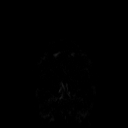

[Series 8: DWI · coronal · 5.0mm · 1.80mm/px · 2 of 34 slices shown (4 of 4)]
[im 1/34]
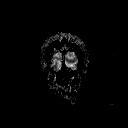
[im 34/34]
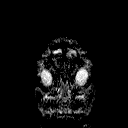

[Series 9: T2 · axial · 5.0mm · 0.51mm/px · z∈[-73,+68]mm · 2 of 22 slices shown (1 of 2)]
[im 1/22]
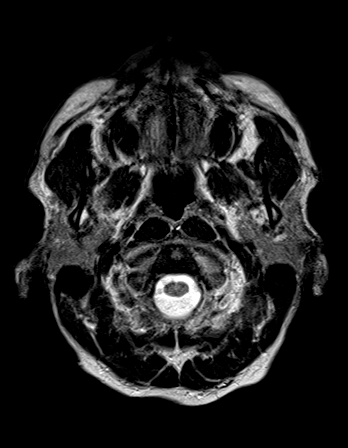
[im 22/22]
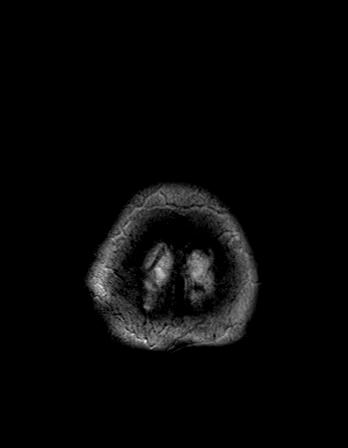

[Series 10: FLAIR · axial · 3.0mm · 0.45mm/px · z∈[-81,+74]mm · 2 of 27 slices shown]
[im 1/27]
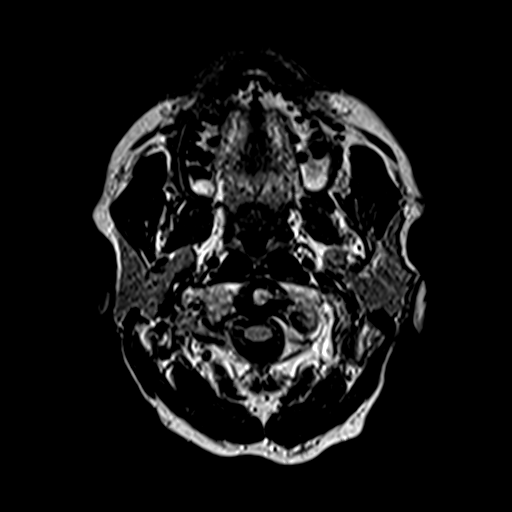
[im 27/27]
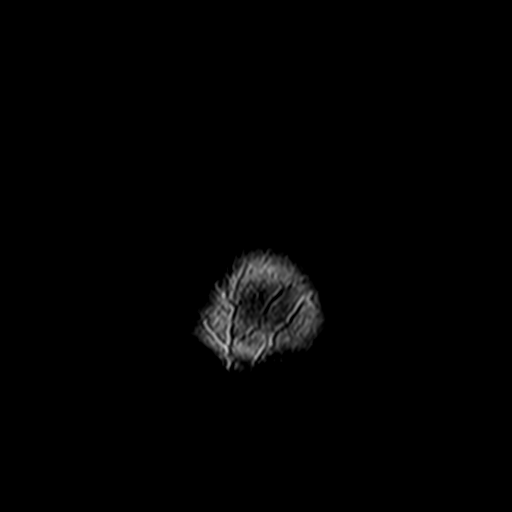

[Series 11: t1_mpr_tra · axial · 1.0mm · 0.45mm/px · z∈[-82,-35]mm · 3 of 160 slices shown]
[im 1/160]
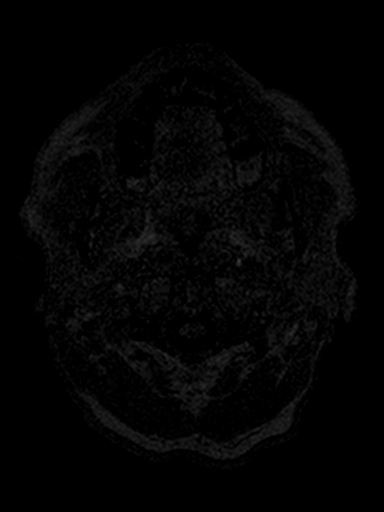
[im 32/160]
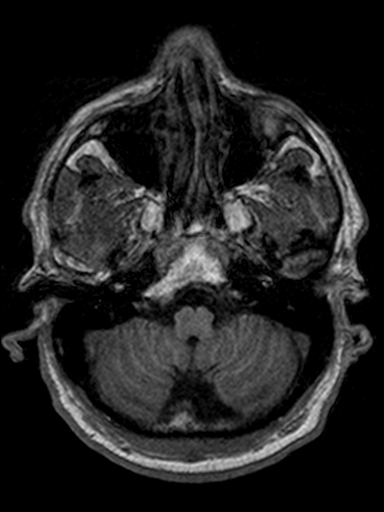
[im 48/160]
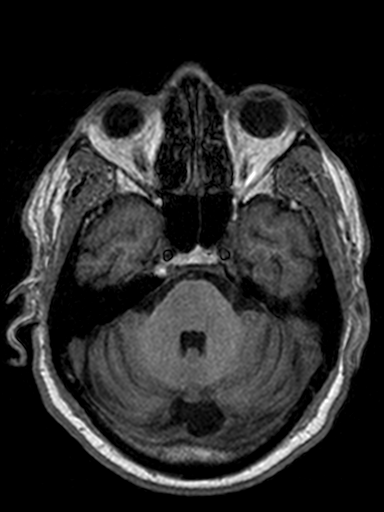

[Series 12: T2 · coronal · 5.0mm · 0.45mm/px · 2 of 26 slices shown (2 of 2)]
[im 1/26]
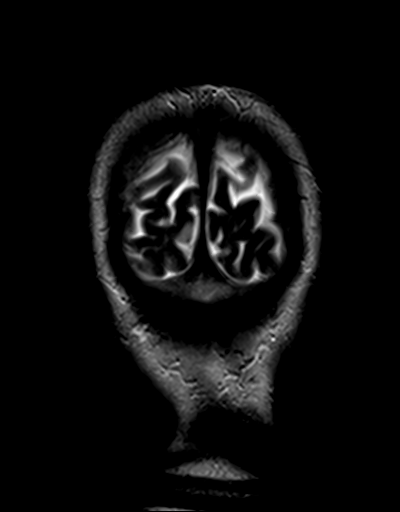
[im 26/26]
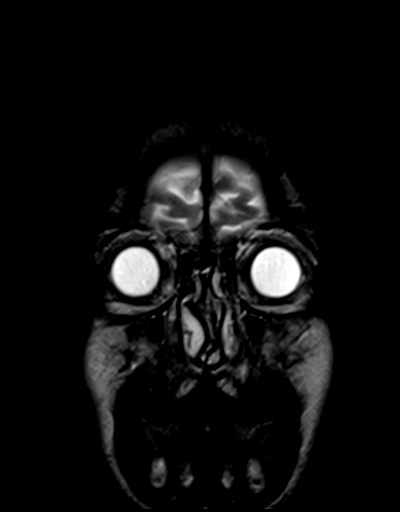

[Series 13: T1 · coronal · 3.0mm · 0.35mm/px · 2 of 24 slices shown (1 of 3)]
[im 1/24]
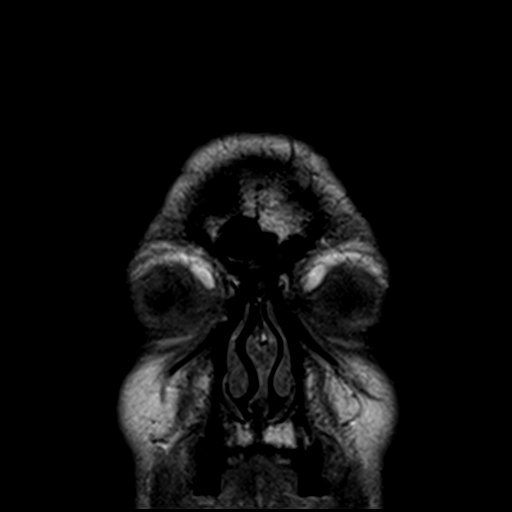
[im 24/24]
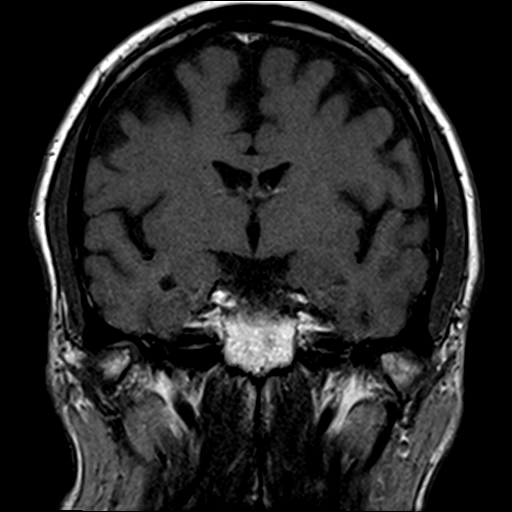

[Series 14: T1 · axial · 3.0mm · 0.35mm/px · 1 of 18 slices shown (2 of 3)]
[im 1/18]
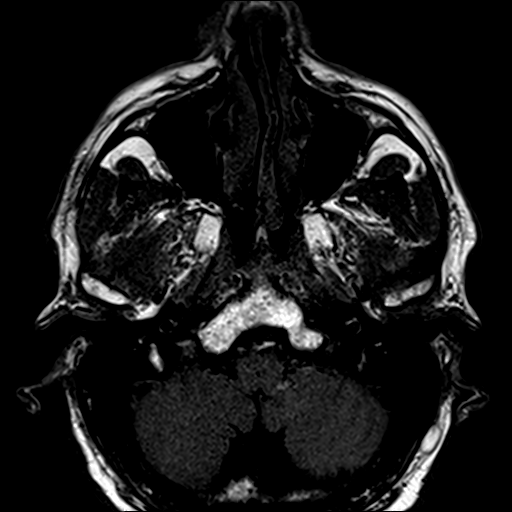

[Series 15: T2 fat-sat · coronal · 3.0mm · 0.35mm/px · 2 of 25 slices shown (1 of 2)]
[im 1/25]
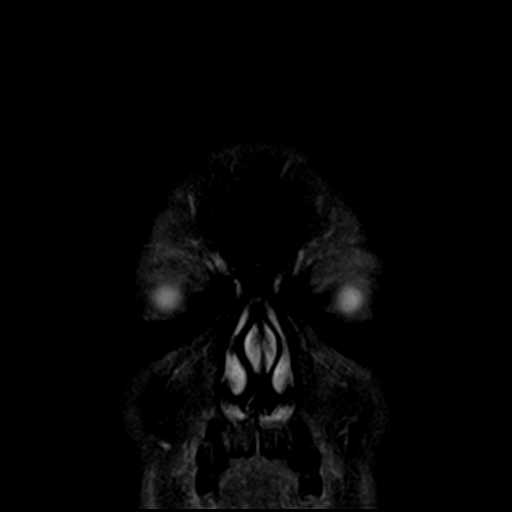
[im 25/25]
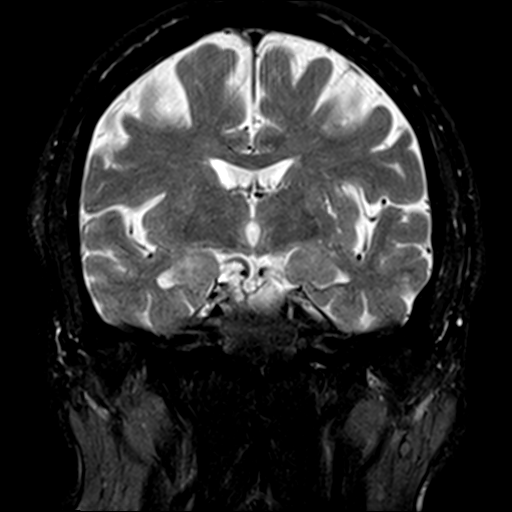

[Series 16: T2 fat-sat · axial · 3.0mm · 0.35mm/px · 1 of 18 slices shown (2 of 2)]
[im 1/18]
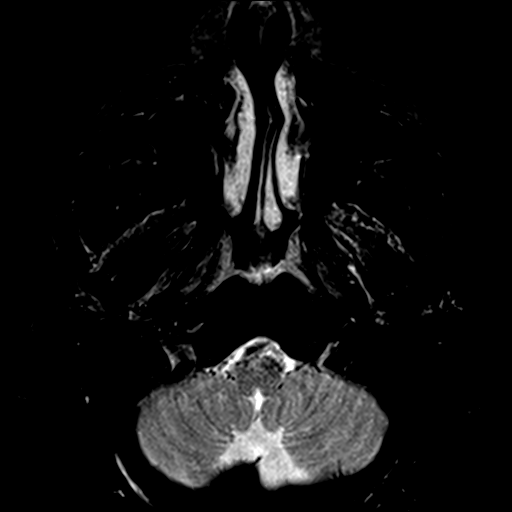

[Series 17: T1 · sagittal · 5.0mm · 0.45mm/px · 2 of 21 slices shown (3 of 3)]
[im 1/21]
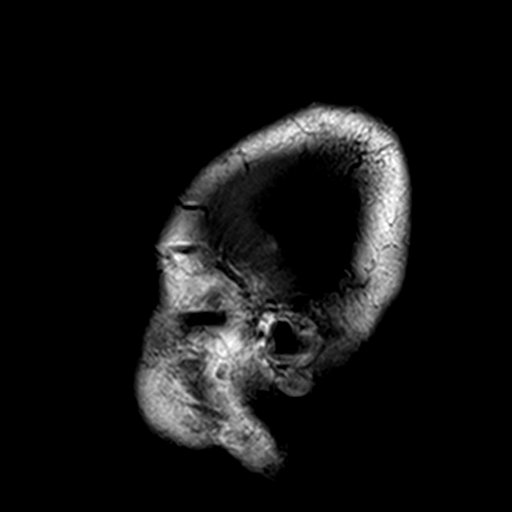
[im 21/21]
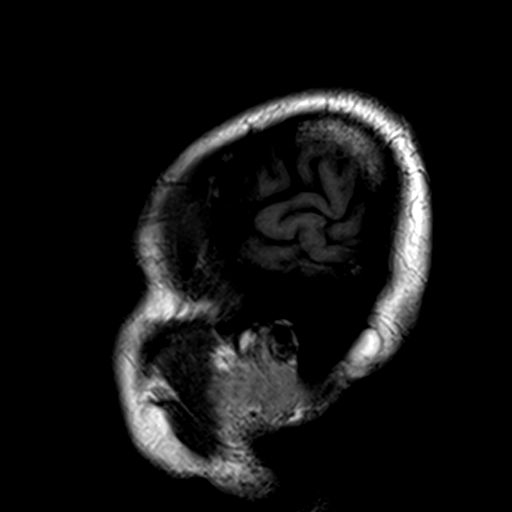

[40 of 48 positions shown; findings below may reference images not displayed]

FINDINGS: The patient declined IV contrast. There is mild motion artifact
throughout.

MRI HEAD FINDINGS

Brain: There is no evidence of acute infarct, intracranial
hemorrhage, mass, midline shift, or extra-axial fluid collection.
Mild cerebral atrophy is within normal limits for age. Mild T2
hyperintensities in the cerebral white matter have increased from
the prior MRI and are greatest in the periatrial regions,
nonspecific but compatible with chronic small vessel ischemia.

Vascular: Major intracranial vascular flow voids are preserved.

Skull and upper cervical spine: Unremarkable bone marrow signal.

Other: None.

MRI ORBITS FINDINGS

Orbits: Prior bilateral cataract extraction is again noted. Both
optic nerves appear somewhat small diffusely. Evaluation for T2
signal abnormality/ edema involving the optic nerves is limited by
motion. No inflammatory changes are present in the orbital fat. The
extraocular muscles are symmetric and normal in appearance. The
lacrimal glands are unremarkable. No orbital mass is seen.

Visualized sinuses: Minimal bilateral ethmoid air cell mucosal
thickening. Trace right mastoid fluid.

Soft tissues: Unremarkable.

Limited intracranial: As above.
IMPRESSION: 1. Mildly motion degraded examination without administration of IV
contrast as detailed above.
2. Suspect bilateral optic nerve atrophy.
3. No acute intracranial abnormality.
4. Mild chronic small vessel ischemic disease.

## 2018-03-18 ENCOUNTER — Emergency Department (HOSPITAL_COMMUNITY): Payer: Medicare Other

## 2018-03-18 ENCOUNTER — Encounter (HOSPITAL_COMMUNITY): Payer: Self-pay

## 2018-03-18 ENCOUNTER — Emergency Department (HOSPITAL_COMMUNITY)
Admission: EM | Admit: 2018-03-18 | Discharge: 2018-03-18 | Disposition: A | Discharge status: Home / Self Care | Payer: Medicare Other | Attending: Emergency Medicine | Admitting: Emergency Medicine

## 2018-03-18 ENCOUNTER — Other Ambulatory Visit: Payer: Self-pay

## 2018-03-18 DIAGNOSIS — Z79899 Other long term (current) drug therapy: Secondary | ICD-10-CM | POA: Diagnosis not present

## 2018-03-18 DIAGNOSIS — Z87891 Personal history of nicotine dependence: Secondary | ICD-10-CM | POA: Diagnosis not present

## 2018-03-18 DIAGNOSIS — B029 Zoster without complications: Secondary | ICD-10-CM | POA: Insufficient documentation

## 2018-03-18 DIAGNOSIS — R21 Rash and other nonspecific skin eruption: Secondary | ICD-10-CM | POA: Diagnosis present

## 2018-03-18 DIAGNOSIS — I10 Essential (primary) hypertension: Secondary | ICD-10-CM | POA: Diagnosis not present

## 2018-03-18 LAB — BASIC METABOLIC PANEL
ANION GAP: 11 (ref 5–15)
BUN: 13 mg/dL (ref 6–20)
CO2: 22 mmol/L (ref 22–32)
Calcium: 9.3 mg/dL (ref 8.9–10.3)
Chloride: 104 mmol/L (ref 101–111)
Creatinine, Ser: 0.96 mg/dL (ref 0.61–1.24)
GFR calc Af Amer: >60 mL/min (ref >60)
Glucose, Bld: 95 mg/dL (ref 65–99)
POTASSIUM: 3.7 mmol/L (ref 3.5–5.1)
SODIUM: 137 mmol/L (ref 135–145)

## 2018-03-18 LAB — CBC
HEMATOCRIT: 40.2 % (ref 39.0–52.0)
HEMOGLOBIN: 13.8 g/dL (ref 13.0–17.0)
MCH: 30.8 pg (ref 26.0–34.0)
MCHC: 34.3 g/dL (ref 30.0–36.0)
MCV: 89.7 fL (ref 78.0–100.0)
Platelets: 95 10*3/uL — ABNORMAL LOW (ref 150–400)
RBC: 4.48 MIL/uL (ref 4.22–5.81)
RDW: 13.8 % (ref 11.5–15.5)
WBC: 4.1 10*3/uL (ref 4.0–10.5)

## 2018-03-18 LAB — I-STAT TROPONIN, ED: Troponin i, poc: 0 ng/mL (ref 0.00–0.08)

## 2018-03-18 MED ORDER — VALACYCLOVIR HCL 1 G PO TABS: 1000.0000 mg | ORAL_TABLET | Freq: Three times a day (TID) | ORAL | 0 refills | Status: AC

## 2018-03-18 NOTE — ED Provider Notes (Signed)
[facility] EMERGENCY DEPARTMENT Provider Note   CSN: [id number] Arrival date & time: 03/18/18  7322     History   Chief Complaint Chief Complaint  Patient presents with  . Chest Pain    HPI Kanton Kamel is a 77 y.o. male.  HPI 77 year old man presents today complaining of rash to left chest began about 3 days ago.  States it feels like pebbles under his skin.  He denies fever, chills, shortness of breath or injury.  Past Medical History:  Diagnosis Date  . Adenomatous colon polyp 01/2009  . Anemia   . BPH (benign prostatic hypertrophy)   . Cataract   . Chronic headaches   . Colon polyps   . Fatty liver   . GERD (gastroesophageal reflux disease)   . Heartburn   . Hepatic hemangioma   . Hiatal hernia   . Hypertension   . Internal hemorrhoids   . Leukopenia     Patient Active Problem List   Diagnosis Date Noted  . Hiatal hernia 12/25/2016  . Routine general medical examination at a health care facility 12/22/2015  . Precordial chest pain 12/20/2015  . 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 Lumbago 01/30/2014  . HLD (hyperlipidemia) 07/21/2013  . Hemorrhoids 04/14/2012  . BPH (benign prostatic hyperplasia) 04/14/2012  . PERSONAL HX COLONIC POLYPS 01/20/2011  . Essential hypertension 04/27/2007  . GERD 04/27/2007    Past Surgical History:  Procedure Laterality Date  . CATARACT EXTRACTION     Left eye  . COLONOSCOPY          Home Medications    Prior to Admission medications   Medication Sig Start Date End Date Taking? Authorizing Provider  acetaminophen (PAIN RELIEF) 325 MG tablet Take 325 mg by mouth every 6 (six) hours as needed for mild pain, moderate pain, fever or headache. Reported on 02/26/2016    [provider]  desipramine (NOPRAMIN) 10 MG tablet Take 1 tablet (10 mg total) by mouth at bedtime. Increase to two tabs qhs thereafter 02/19/17   Armbruster, Carlota Raspberry, MD  dicyclomine (BENTYL) 10 MG capsule Take 1  capsule (10 mg total) by mouth every 8 (eight) hours as needed for spasms. 02/19/17   Armbruster, Carlota Raspberry, MD  losartan (COZAAR) 50 MG tablet Take 1 tablet (50 mg total) by mouth daily. Patient taking differently: Take 50 mg by mouth. Twice a week 12/25/16   Charlott Rakes, MD  polyethylene glycol powder (GLYCOLAX/MIRALAX) powder Take 255 g by mouth daily. 04/02/17   Armbruster, Carlota Raspberry, MD  Probiotic Product (VSL#3) CAPS Take 1 capsule by mouth 2 (two) times daily. 05/07/17   Armbruster, Carlota Raspberry, MD  valACYclovir (VALTREX) 1000 MG tablet Take 1 tablet (1,000 mg total) by mouth 3 (three) times daily for 21 days. 03/18/18 04/08/18  Pattricia Boss, MD    Family History Family History  Problem Relation Age of Onset  . Stomach cancer Mother   . Diabetes Father   . Diabetes Brother   . Diabetes Sister   . Colon cancer Neg Hx   . Colon polyps Neg Hx   . Esophageal cancer Neg Hx     Social History Social History   Tobacco Use  . Smoking status: Former Smoker    Last attempt to quit: 02/16/1981    Years since quitting: 37.1  . Smokeless tobacco: Never Used  Substance Use Topics  . Alcohol use: Yes    Alcohol/week: 1.2 - 2.4 oz    Types: 2 -  4 Glasses of wine per week    Comment: 4-5 drinks week  . Drug use: No     Allergies   Gadolinium derivatives and Prilosec [omeprazole]   Review of Systems Review of Systems  Constitutional: Positive for fatigue.  HENT: Negative.   Eyes: Negative.   Respiratory: Negative.   Cardiovascular: Positive for chest pain.  Gastrointestinal: Negative.   Endocrine: Negative.   Musculoskeletal: Negative.   Skin: Positive for rash.  Neurological: Positive for headaches. Negative for tremors, seizures, syncope, speech difficulty, weakness and numbness.  Hematological: Negative.   Psychiatric/Behavioral: Negative.   All other systems reviewed and are negative.    Physical Exam Updated Vital Signs BP (!) 162/85 (BP Location: Right Arm)   Pulse 72    Temp 98 F (36.7 C) (Oral)   Resp 18   SpO2 100%   Physical Exam  Constitutional: He appears well-developed and well-nourished. He does not appear ill.  Hypertension with known hypertension patient has not had his meds for several hours due to being in the ED  HENT:  Head: Normocephalic and atraumatic.  Eyes: Pupils are equal, round, and reactive to light. EOM are normal.  Neck: Normal range of motion. Neck supple.  Cardiovascular: Normal rate, regular rhythm and normal pulses.  Pulmonary/Chest: Effort normal.  Abdominal: Soft. Bowel sounds are normal. There is tenderness.  Musculoskeletal: Normal range of motion.       Right lower leg: Normal.       Left lower leg: Normal.  Skin: Skin is warm and dry. Rash noted.     Vesicular lesions on left c4 c5 dermatomal distribution  Nursing note and vitals reviewed.    ED Treatments / Results  Labs (all labs ordered are listed, but only abnormal results are displayed) Labs Reviewed  CBC - Abnormal; Notable for the following components:      Result Value   Platelets 95 (*)    All other components within normal limits  BASIC METABOLIC PANEL  I-STAT TROPONIN, ED  I-STAT TROPONIN, ED    EKG EKG Interpretation  Date/Time:  Thursday Mar 18 2018 06:54:42 EDT Ventricular Rate:  73 PR Interval:  150 QRS Duration: 84 QT Interval:  420 QTC Calculation: 462 R Axis:   59 Text Interpretation:  Normal sinus rhythm Minimal voltage criteria for LVH, may be normal variant Borderline ECG Confirmed by Pattricia Boss [phone]) on 03/18/2018 1:02:51 PM   Radiology Dg Chest 2 View  Result Date: 03/18/2018 CLINICAL DATA:  Chest pain EXAM: CHEST - 2 VIEW COMPARISON:  04/14/2011 FINDINGS: Heart size and vascularity normal. Lungs are clear without infiltrate or effusion. Scarring in the right lung base is unchanged. IMPRESSION: No active cardiopulmonary disease. Electronically Signed   By: Franchot Gallo M.D.   On: 03/18/2018 07:40     Procedures Procedures (including critical care time)  Medications Ordered in ED Medications - No data to display   Initial Impression / Assessment and Plan / ED Course  I have reviewed the triage vital signs and the nursing notes.  Pertinent labs & imaging results that were available during my care of the patient were reviewed by me and considered in my medical decision making (see chart for details).       Final Clinical Impressions(s) / ED Diagnoses   Final diagnoses:  Herpes zoster without complication    ED Discharge Orders        Ordered    valACYclovir (VALTREX) 1000 MG tablet  3 times daily  03/18/18 1315       Pattricia Boss, MD 03/18/18 1323

## 2018-03-18 NOTE — ED Notes (Signed)
Verified with EDP Ray- pt does not need  troponin before d/c.

## 2018-03-18 NOTE — ED Notes (Signed)
Pt does not want troponin rechecked at this time.  Delay explained

## 2018-03-18 NOTE — ED Triage Notes (Signed)
Pt reports that he has been having L sided CP that started last night with no associating symptoms. Reports that he has two red spots on his chest that are swollen, unable to see.

## 2018-03-24 ENCOUNTER — Encounter: Payer: Self-pay | Admitting: Podiatry

## 2018-03-24 ENCOUNTER — Ambulatory Visit (INDEPENDENT_AMBULATORY_CARE_PROVIDER_SITE_OTHER): Payer: Medicare Other | Admitting: Podiatry

## 2018-03-24 DIAGNOSIS — M216X9 Other acquired deformities of unspecified foot: Secondary | ICD-10-CM | POA: Diagnosis not present

## 2018-03-24 DIAGNOSIS — B351 Tinea unguium: Secondary | ICD-10-CM | POA: Diagnosis not present

## 2018-03-24 DIAGNOSIS — M79676 Pain in unspecified toe(s): Secondary | ICD-10-CM | POA: Diagnosis not present

## 2018-03-24 DIAGNOSIS — Q828 Other specified congenital malformations of skin: Secondary | ICD-10-CM

## 2018-03-24 NOTE — Progress Notes (Signed)
Patient ID: Kenneth Roberts, male   DOB: 06/20/1941, 77 y.o.   MRN: [id number] Complaint:  Visit Type: Patient returns to my office for continued preventative foot care services. Complaint: Patient states" my nails have grown long and thick and become painful to walk and wear shoes" . The patient presents for preventative foot care services. No changes to ROS  Podiatric Exam: Vascular: dorsalis pedis and posterior tibial pulses are palpable bilateral. Capillary return is immediate. Temperature gradient is WNL. Skin turgor WNL  Sensorium: Normal Semmes Weinstein monofilament test. Normal tactile sensation bilaterally. Nail Exam: Pt has thick disfigured discolored nails with subungual debris noted bilateral entire nail hallux through fifth toenails Ulcer Exam: There is no evidence of ulcer or pre-ulcerative changes or infection. Orthopedic Exam: Muscle tone and strength are WNL. No limitations in general ROM. No crepitus or effusions noted. Foot type and digits show no abnormalities. Bony prominences are unremarkable. Skin: No Porokeratosis. No infection or ulcers.  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  Diagnosis:  Onychomycosis, , Pain in right toe, pain in left toes,  Porokeratosis  Treatment & Plan Procedures and Treatment: Consent by patient was obtained for treatment procedures. The patient understood the discussion of treatment and procedures well. All questions were answered thoroughly reviewed. Debridement of mycotic and hypertrophic toenails, 1 through 5 bilateral and clearing of subungual debris. No ulceration, no infection noted. Debride porokeratosis sub 5th met B/l Return Visit-Office Procedure: Patient instructed to return to the office for a follow up visit 9 weeks  for continued evaluation and treatment.   Jailyn Langhorst DPM 

## 2018-04-29 ENCOUNTER — Other Ambulatory Visit: Payer: Self-pay

## 2018-04-30 ENCOUNTER — Ambulatory Visit (INDEPENDENT_AMBULATORY_CARE_PROVIDER_SITE_OTHER): Payer: Medicare Other | Admitting: Gastroenterology

## 2018-04-30 ENCOUNTER — Encounter: Payer: Self-pay | Admitting: Gastroenterology

## 2018-04-30 VITALS — BP 150/84 | HR 80 | Ht 69.5 in | Wt 180.1 lb

## 2018-04-30 DIAGNOSIS — R1013 Epigastric pain: Secondary | ICD-10-CM | POA: Diagnosis not present

## 2018-04-30 DIAGNOSIS — F458 Other somatoform disorders: Secondary | ICD-10-CM

## 2018-04-30 DIAGNOSIS — Z1211 Encounter for screening for malignant neoplasm of colon: Secondary | ICD-10-CM

## 2018-04-30 DIAGNOSIS — R0989 Other specified symptoms and signs involving the circulatory and respiratory systems: Secondary | ICD-10-CM

## 2018-04-30 MED ORDER — BUSPIRONE HCL 15 MG PO TABS: 15.0000 mg | ORAL_TABLET | Freq: Every day | ORAL | 1 refills | Status: DC

## 2018-04-30 MED ORDER — SUCRALFATE 1 G PO TABS: 1.0000 g | ORAL_TABLET | Freq: Three times a day (TID) | ORAL | 3 refills | Status: DC

## 2018-04-30 NOTE — Patient Instructions (Addendum)
If you are age 75 or older, your body mass index should be between 23-30. Your Body mass index is 26.22 kg/m. If this is out of the aforementioned range listed, please consider follow up with your Primary Care Provider.  If you are age 78 or younger, your body mass index should be between 19-25. Your Body mass index is 26.22 kg/m. If this is out of the aformentioned range listed, please consider follow up with your Primary Care Provider.   We have sent the following medications to your pharmacy for you to pick up at your convenience: Carafate 1g: Take three times a day.  This will produce black stool which is normal Buspirone 15mg : Take every night at bedtime    Thank you for entrusting me with your care and for choosing [facility], Dr. [place] Cellar

## 2018-04-30 NOTE — Progress Notes (Signed)
HPI :  77 year old male here for a follow-up visit. He has been followed previously for chronic symptoms of globus, chronic abdominal discomfort  / bloating. See prior clinic notes for details of his case. He's had an extensive evaluation to date with prior EGD, colonoscopy, capsule study, and multiple CTs without any significant etiology noted. Prior celiac testing negative TTG but mildly positive anti-gliadin AB. He's had a prior negative ENT evaluation. At our last visit I recommended a trial of desipramine as well as using Bentyl. He states she took these for period of time but unclear how much benefit he had, sounds like he did not tolerate desipramine at all and stopped it. He was given a trial of VSL#3 for bloating, he can't recall if he took this or not.   He had a gastric imaging study of August 2018 which was normal as well as a CT scan of the abdomen by his primary care done again in September 2018 which was also fairly normal without any acute pathology.  He reports his main complaint today is feelings of early satiety and not eating well due to that symptom. He has a chronically poor appetite. He has upper abdominal discomfort which bothers him fairly frequently. He states eating to make this worse. He denies any nausea or vomiting. He is not losing any weight, his weight is 1 pounds heavier than he was a year ago. He has fairly regular bowel habits, 2-3 bowel movements per day without any blood in stols. He reports his globus symptom has persisted but unchanged. He denies any overt dysphagia or heartburn. He has tried taking some Pepto-Bismol which has helped in the past to settle his stomach. He also states taking Mylanta helps at times. He is been tried on omeprazole in the past which she states did not help at all.  Most recent workup: Colonoscopy 02/26/16 - diverticulosis, hemorrhoids, 84mm ascending colon polyp which was benign inflammatory polyp Colonoscopy 01/02/2016 - poor prep EGD  5/16 - irregular z-line, small hiatal hernia, normal stomach and duodenum - biopsies normal, no BE or celiac Colonoscopy May 2016 - poor prep Colonoscopy 2010 - adenoma CT scan 01/2016 - no cause for symptoms noted, benign hepatic hemagioma UGI series 2008-  - nonspecific dysmotility, 74mm tablet got stuck at aortic arch Gastric emptying study 06/03/2017 - normal CT scan abdomen / pelvis with contrast 07/23/2017 - 1.8cm hemangioma left hepatic lobe, mild DJD, no acute findings      Past Medical History:  Diagnosis Date  . Adenomatous colon polyp 01/2009  . Anemia   . BPH (benign prostatic hypertrophy)   . Cataract   . Chronic headaches   . Colon polyps   . Fatty liver   . GERD (gastroesophageal reflux disease)   . Heartburn   . Hepatic hemangioma   . Hiatal hernia   . Hypertension   . Internal hemorrhoids   . Leukopenia      Past Surgical History:  Procedure Laterality Date  . CATARACT EXTRACTION     Left eye  . COLONOSCOPY     Family History  Problem Relation Age of Onset  . Stomach cancer Mother   . Diabetes Father   . Diabetes Brother   . Diabetes Sister   . Colon cancer Neg Hx   . Colon polyps Neg Hx   . Esophageal cancer Neg Hx    Social History   Tobacco Use  . Smoking status: Former Smoker    Last attempt to quit:  02/16/1981    Years since quitting: 37.2  . Smokeless tobacco: Never Used  Substance Use Topics  . Alcohol use: Yes    Alcohol/week: 1.2 - 2.4 oz    Types: 2 - 4 Glasses of wine per week    Comment: 4-5 drinks week  . Drug use: No   No current outpatient medications on file.   No current facility-administered medications for this visit.    Allergies  Allergen Reactions  . Gadolinium Derivatives   . Prilosec [Omeprazole] Itching     Review of Systems: All systems reviewed and negative except where noted in HPI.   Lab Results  Component Value Date   WBC 4.1 03/18/2018   HGB 13.8 03/18/2018   HCT 40.2 03/18/2018   MCV 89.7  03/18/2018   PLT 95 (L) 03/18/2018    Lab Results  Component Value Date   CREATININE 0.96 03/18/2018   BUN 13 03/18/2018   NA 137 03/18/2018   K 3.7 03/18/2018   CL 104 03/18/2018   CO2 22 03/18/2018    Lab Results  Component Value Date   ALT 12 12/26/2016   AST 16 12/26/2016   ALKPHOS 22 (L) 12/26/2016   BILITOT 0.7 12/26/2016     Physical Exam: BP (!) 150/84 (BP Location: Left Arm, Patient Position: Sitting, Cuff Size: Normal)   Pulse 80   Ht 5' 9.5" (1.765 m)   Wt 180 lb 2 oz (81.7 kg)   BMI 26.22 kg/m  Constitutional: Pleasant,well-developed, male in no acute distress. HEENT: Normocephalic and atraumatic. Conjunctivae are normal. No scleral icterus. Neck supple.  Cardiovascular: Normal rate, regular rhythm.  Pulmonary/chest: Effort normal and breath sounds normal. No wheezing, rales or rhonchi. Abdominal: Soft, nondistended, nontender.  There are no masses palpable. No hepatomegaly. Extremities: no edema Lymphadenopathy: No cervical adenopathy noted. Neurological: Alert and oriented to person place and time. Skin: Skin is warm and dry. No rashes noted. Psychiatric: Normal mood and affect. Behavior is normal.   ASSESSMENT AND PLAN: 77 year old male here for reassessment of the following issues:  Chronic abdominal discomfort / dyspepsia - he has chronic symptoms with an extensive evaluation as outlined above which has not shown any significant pathology. 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 I discussed what this is with him. I reassured him there is no evidence of malignancy based on his workup to date, which he is most concerned about. His recent gastric emptying study was normal. I discussed options for treatment with him. He has not done well with TCA in the past. I offered him a course of buspirone 15 mg at bedtime to see if this helps. Following discussion of risks / benefits he wanted to proceed. Otherwise he does find benefit with using  Pepto-Bismol at times. He can continue use this, prescribed Carafate tablets for him if this is cheaper. He can contact me in 1 month if no improvement. We may consider a trial of Remeron to help stability his appetite if he does not improve with buspirone. He agreed.  Globus - no improvement with TCA and he did not tolerate it. Stable. Negative EGD and ENT evaluation. No improvement with PPI. We had previously discussed manometry testing which I don't think will show achalasia or any significant motility disorder, and will hold off on that for now. Main issue is his postprandial symptoms as above right now, will await course on regimen as outlined.   Colon cancer screening - colonoscopy done in April 2017 without  any adenomas appreciated. I counseled him he does not warrant colon cancer screening until 2027 and reassured him. At that point in time he'll be in his early 49s, and would likely not warrant further colonoscopy unless symptomatic. We can reassess at that time.  [place] Cellar, MD [facility]

## 2018-05-26 ENCOUNTER — Encounter: Payer: Self-pay | Admitting: Podiatry

## 2018-05-26 ENCOUNTER — Ambulatory Visit: Payer: Medicare Other | Admitting: Podiatry

## 2018-05-26 ENCOUNTER — Ambulatory Visit (INDEPENDENT_AMBULATORY_CARE_PROVIDER_SITE_OTHER): Payer: Medicare Other | Admitting: Podiatry

## 2018-05-26 DIAGNOSIS — Q828 Other specified congenital malformations of skin: Secondary | ICD-10-CM | POA: Diagnosis not present

## 2018-05-26 DIAGNOSIS — M79676 Pain in unspecified toe(s): Secondary | ICD-10-CM | POA: Diagnosis not present

## 2018-05-26 DIAGNOSIS — M216X9 Other acquired deformities of unspecified foot: Secondary | ICD-10-CM | POA: Diagnosis not present

## 2018-05-26 DIAGNOSIS — B351 Tinea unguium: Secondary | ICD-10-CM | POA: Diagnosis not present

## 2018-05-26 NOTE — Progress Notes (Signed)
Patient ID: Kenneth Roberts, male   DOB: 01/14/1941, 77 y.o.   MRN: [id number] Complaint:  Visit Type: Patient returns to my office for continued preventative foot care services. Complaint: Patient states" my nails have grown long and thick and become painful to walk and wear shoes" . The patient presents for preventative foot care services. No changes to ROS  Podiatric Exam: Vascular: dorsalis pedis and posterior tibial pulses are palpable bilateral. Capillary return is immediate. Temperature gradient is WNL. Skin turgor WNL  Sensorium: Normal Semmes Weinstein monofilament test. Normal tactile sensation bilaterally. Nail Exam: Pt has thick disfigured discolored nails with subungual debris noted bilateral entire nail hallux through fifth toenails Ulcer Exam: There is no evidence of ulcer or pre-ulcerative changes or infection. Orthopedic Exam: Muscle tone and strength are WNL. No limitations in general ROM. No crepitus or effusions noted. Foot type and digits show no abnormalities. Bony prominences are unremarkable. Skin: No Porokeratosis. No infection or ulcers.  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  Diagnosis:  Onychomycosis, , Pain in right toe, pain in left toes,  Porokeratosis  Treatment & Plan Procedures and Treatment: Consent by patient was obtained for treatment procedures. The patient understood the discussion of treatment and procedures well. All questions were answered thoroughly reviewed. Debridement of mycotic and hypertrophic toenails, 1 through 5 bilateral and clearing of subungual debris. No ulceration, no infection noted. Debride porokeratosis sub 5th met B/l Return Visit-Office Procedure: Patient instructed to return to the office for a follow up visit 9 weeks  for continued evaluation and treatment.     DPM 

## 2018-06-02 ENCOUNTER — Ambulatory Visit: Payer: Medicare Other | Admitting: Podiatry

## 2018-06-21 ENCOUNTER — Telehealth: Payer: Self-pay | Admitting: Gastroenterology

## 2018-06-21 NOTE — Telephone Encounter (Signed)
Routed to Dr. Armbruster. 

## 2018-06-21 NOTE — Telephone Encounter (Signed)
Patient states medication sucralfate does not seem to be working and was told to call back in and Dr.Armbruster would prescribe something else. Patient does not know other medication that Dr.Armbruster suggested before but would like it called in.

## 2018-06-23 NOTE — Telephone Encounter (Signed)
Kenneth Roberts I had placed him on Buspirone 15mg  q HS to see if this helped his dyspepsia. Can you clarify if he is taking this and if it has helped at all. Could try increasing it to twice daily for a few weeks prior to making a change. If he doesn't tolerate it however or it hasn't helped at all, may switch to trial of Remeron. Can you let me know? Thanks

## 2018-06-23 NOTE — Telephone Encounter (Signed)
Patient was confused, had started to take the buspirone then stopped taking it, instructed to restart this medication along with continuing the carafate. Let him know that if it doesn't seem to be helping may increase the buspirone to BID. If still not better instructed to contact office.

## 2018-07-23 ENCOUNTER — Other Ambulatory Visit: Payer: Self-pay | Admitting: Gastroenterology

## 2018-07-30 ENCOUNTER — Encounter: Payer: Self-pay | Admitting: Podiatry

## 2018-07-30 ENCOUNTER — Ambulatory Visit: Payer: Medicare Other | Admitting: Podiatry

## 2018-07-30 ENCOUNTER — Ambulatory Visit (INDEPENDENT_AMBULATORY_CARE_PROVIDER_SITE_OTHER): Payer: Medicare Other | Admitting: Podiatry

## 2018-07-30 DIAGNOSIS — M216X9 Other acquired deformities of unspecified foot: Secondary | ICD-10-CM

## 2018-07-30 DIAGNOSIS — B351 Tinea unguium: Secondary | ICD-10-CM | POA: Diagnosis not present

## 2018-07-30 DIAGNOSIS — M79676 Pain in unspecified toe(s): Secondary | ICD-10-CM

## 2018-07-30 DIAGNOSIS — Q828 Other specified congenital malformations of skin: Secondary | ICD-10-CM

## 2018-07-30 NOTE — Progress Notes (Signed)
Patient ID: Kenneth Roberts, male   DOB: 03/11/1941, 77 y.o.   MRN: [id number] Complaint:  Visit Type: Patient returns to my office for continued preventative foot care services. Complaint: Patient states" my nails have grown long and thick and become painful to walk and wear shoes" . The patient presents for preventative foot care services. No changes to ROS  Podiatric Exam: Vascular: dorsalis pedis and posterior tibial pulses are palpable bilateral. Capillary return is immediate. Temperature gradient is WNL. Skin turgor WNL  Sensorium: Normal Semmes Weinstein monofilament test. Normal tactile sensation bilaterally. Nail Exam: Pt has thick disfigured discolored nails with subungual debris noted bilateral entire nail hallux through fifth toenails Ulcer Exam: There is no evidence of ulcer or pre-ulcerative changes or infection. Orthopedic Exam: Muscle tone and strength are WNL. No limitations in general ROM. No crepitus or effusions noted. Foot type and digits show no abnormalities. Bony prominences are unremarkable. Skin: No Porokeratosis. No infection or ulcers.  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  Diagnosis:  Onychomycosis, , Pain in right toe, pain in left toes,  Porokeratosis  Treatment & Plan Procedures and Treatment: Consent by patient was obtained for treatment procedures. The patient understood the discussion of treatment and procedures well. All questions were answered thoroughly reviewed. Debridement of mycotic and hypertrophic toenails, 1 through 5 bilateral and clearing of subungual debris. No ulceration, no infection noted. Debride porokeratosis sub 5th met B/l Return Visit-Office Procedure: Patient instructed to return to the office for a follow up visit 9 weeks  for continued evaluation and treatment.   Mishti Swanton DPM 

## 2018-10-08 ENCOUNTER — Ambulatory Visit (INDEPENDENT_AMBULATORY_CARE_PROVIDER_SITE_OTHER): Payer: Medicare Other | Admitting: Podiatry

## 2018-10-08 ENCOUNTER — Encounter: Payer: Self-pay | Admitting: Podiatry

## 2018-10-08 DIAGNOSIS — Q828 Other specified congenital malformations of skin: Secondary | ICD-10-CM | POA: Diagnosis not present

## 2018-10-08 DIAGNOSIS — M79676 Pain in unspecified toe(s): Secondary | ICD-10-CM | POA: Diagnosis not present

## 2018-10-08 DIAGNOSIS — M216X9 Other acquired deformities of unspecified foot: Secondary | ICD-10-CM

## 2018-10-08 DIAGNOSIS — B351 Tinea unguium: Secondary | ICD-10-CM | POA: Diagnosis not present

## 2018-10-08 NOTE — Progress Notes (Signed)
Patient ID: Kenneth Roberts, male   DOB: 04/22/1941, 77 y.o.   MRN: [id number] Complaint:  Visit Type: Patient returns to my office for continued preventative foot care services. Complaint: Patient states" my nails have grown long and thick and become painful to walk and wear shoes" . The patient presents for preventative foot care services. No changes to ROS  Podiatric Exam: Vascular: dorsalis pedis and posterior tibial pulses are palpable bilateral. Capillary return is immediate. Temperature gradient is WNL. Skin turgor WNL  Sensorium: Normal Semmes Weinstein monofilament test. Normal tactile sensation bilaterally. Nail Exam: Pt has thick disfigured discolored nails with subungual debris noted bilateral entire nail hallux through fifth toenails Ulcer Exam: There is no evidence of ulcer or pre-ulcerative changes or infection. Orthopedic Exam: Muscle tone and strength are WNL. No limitations in general ROM. No crepitus or effusions noted. Foot type and digits show no abnormalities. Bony prominences are unremarkable. Skin: No Porokeratosis. No infection or ulcers.  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  Diagnosis:  Onychomycosis, , Pain in right toe, pain in left toes,  Porokeratosis  Treatment & Plan Procedures and Treatment: Consent by patient was obtained for treatment procedures. The patient understood the discussion of treatment and procedures well. All questions were answered thoroughly reviewed. Debridement of mycotic and hypertrophic toenails, 1 through 5 bilateral and clearing of subungual debris. No ulceration, no infection noted. Debride porokeratosis sub 5th met B/l Return Visit-Office Procedure: Patient instructed to return to the office for a follow up visit 9 weeks  for continued evaluation and treatment.   Letha Mirabal DPM 

## 2018-11-23 ENCOUNTER — Encounter (HOSPITAL_COMMUNITY): Payer: Self-pay

## 2018-11-23 ENCOUNTER — Other Ambulatory Visit: Payer: Self-pay

## 2018-11-23 ENCOUNTER — Emergency Department (HOSPITAL_COMMUNITY)
Admission: EM | Admit: 2018-11-23 | Discharge: 2018-11-23 | Disposition: A | Discharge status: Home / Self Care | Payer: Medicare Other | Attending: Emergency Medicine | Admitting: Emergency Medicine

## 2018-11-23 DIAGNOSIS — R21 Rash and other nonspecific skin eruption: Secondary | ICD-10-CM | POA: Diagnosis present

## 2018-11-23 DIAGNOSIS — Z79899 Other long term (current) drug therapy: Secondary | ICD-10-CM | POA: Insufficient documentation

## 2018-11-23 DIAGNOSIS — Z87891 Personal history of nicotine dependence: Secondary | ICD-10-CM | POA: Insufficient documentation

## 2018-11-23 DIAGNOSIS — I10 Essential (primary) hypertension: Secondary | ICD-10-CM | POA: Insufficient documentation

## 2018-11-23 DIAGNOSIS — E785 Hyperlipidemia, unspecified: Secondary | ICD-10-CM | POA: Insufficient documentation

## 2018-11-23 MED ORDER — HYDROXYZINE HCL 25 MG PO TABS: 25.0000 mg | ORAL_TABLET | Freq: Four times a day (QID) | ORAL | 0 refills | Status: DC | PRN

## 2018-11-23 MED ORDER — HYDROXYZINE HCL 25 MG PO TABS
25.0000 mg | ORAL_TABLET | Freq: Once | ORAL | Status: AC
  Administered 2018-11-23: 25 mg via ORAL
  Filled 2018-11-23: qty 1

## 2018-11-23 MED ORDER — TRIAMCINOLONE ACETONIDE 0.1 % EX CREA: 1.0000 "application " | TOPICAL_CREAM | Freq: Two times a day (BID) | CUTANEOUS | 0 refills | Status: DC

## 2018-11-23 MED ORDER — DEXAMETHASONE SODIUM PHOSPHATE 4 MG/ML IJ SOLN
10.0000 mg | Freq: Once | INTRAMUSCULAR | Status: AC
  Administered 2018-11-23: 10 mg via INTRAMUSCULAR
  Filled 2018-11-23: qty 3

## 2018-11-23 MED ORDER — PREDNISONE 20 MG PO TABS: 40.0000 mg | ORAL_TABLET | Freq: Every day | ORAL | 0 refills | Status: DC

## 2018-11-23 MED ORDER — LORAZEPAM 1 MG PO TABS
1.0000 mg | ORAL_TABLET | Freq: Once | ORAL | Status: AC
  Administered 2018-11-23: 1 mg via ORAL
  Filled 2018-11-23: qty 1

## 2018-11-23 NOTE — ED Notes (Signed)
Patient verbalizes understanding of discharge instructions. Opportunity for questioning and answers were provided. Armband removed by staff, pt discharged from ED.  

## 2018-11-23 NOTE — ED Triage Notes (Signed)
Pt presents with chest pain and rash times 1 monthy withyout improvement.  Tonight, pt has pain going all the way down his back to his feet.  Denies diaphoresis & N&V.

## 2018-11-23 NOTE — ED Notes (Signed)
ED Provider at bedside. 

## 2018-11-23 NOTE — ED Provider Notes (Signed)
[facility] EMERGENCY DEPARTMENT Provider Note   CSN: [id number] Arrival date & time: 11/23/18  0645     History   Chief Complaint Chief Complaint  Patient presents with  . Chest Pain  . Rash    HPI Kenneth Roberts is a 78 y.o. male.  HPI   72yM c/o "buring up all over." Describes diffuse rash, but worse on trunk and upper extremities. Onset ~3w ago. Burns/hurts/itches. No other acute complaints. Specifically denies respiratory complaints, GI complaints or dizziness/lightheadedness. Recently switched over to Gain laundry detergent around the time symptoms began but otherwise no new exposures that he can recall.   Past Medical History:  Diagnosis Date  . Adenomatous colon polyp 01/2009  . Anemia   . BPH (benign prostatic hypertrophy)   . Cataract   . Chronic headaches   . Colon polyps   . Fatty liver   . GERD (gastroesophageal reflux disease)   . Heartburn   . Hepatic hemangioma   . Hiatal hernia   . Hypertension   . Internal hemorrhoids   . Leukopenia     Patient Active Problem List   Diagnosis Date Noted  . Hiatal hernia 12/25/2016  . Routine general medical examination at a health care facility 12/22/2015  . Precordial chest pain 12/20/2015  . 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 Lumbago 01/30/2014  . HLD (hyperlipidemia) 07/21/2013  . Hemorrhoids 04/14/2012  . BPH (benign prostatic hyperplasia) 04/14/2012  . PERSONAL HX COLONIC POLYPS 01/20/2011  . Essential hypertension 04/27/2007  . GERD 04/27/2007    Past Surgical History:  Procedure Laterality Date  . CATARACT EXTRACTION     Left eye  . COLONOSCOPY          Home Medications    Prior to Admission medications   Medication Sig Start Date End Date Taking? Authorizing Provider  amLODipine (NORVASC) 5 MG tablet Take 5 mg by mouth daily. 05/21/18   [provider]  busPIRone (BUSPAR) 15 MG tablet TAKE 1 TABLET BY MOUTH AT BEDTIME 07/26/18   Armbruster, Carlota Raspberry, MD    hydrOXYzine (ATARAX/VISTARIL) 25 MG tablet TAKE 1 TABLET (25 MG) BY MOITH 3 TIMES PER DAY AS NEEDED FOR 10 DAYS 09/22/18   [provider]  losartan (COZAAR) 25 MG tablet Take 25 mg by mouth daily. 05/21/18   [provider]  pantoprazole (PROTONIX) 40 MG tablet TAKE 1 TABLET (40 MG) BY MOUTH ONCE DAILY FOR 30 DAYS 09/14/18   [provider]  sucralfate (CARAFATE) 1 g tablet Take 1 tablet (1 g total) by mouth 4 (four) times daily -  with meals and at bedtime. 04/30/18   Armbruster, Carlota Raspberry, MD    Family History Family History  Problem Relation Age of Onset  . Stomach cancer Mother   . Diabetes Father   . Diabetes Brother   . Diabetes Sister   . Colon cancer Neg Hx   . Colon polyps Neg Hx   . Esophageal cancer Neg Hx     Social History Social History   Tobacco Use  . Smoking status: Former Smoker    Last attempt to quit: 02/16/1981    Years since quitting: 37.7  . Smokeless tobacco: Never Used  Substance Use Topics  . Alcohol use: Yes    Alcohol/week: 2.0 - 4.0 standard drinks    Types: 2 - 4 Glasses of wine per week    Comment: 4-5 drinks week  . Drug use: No     Allergies  Gadolinium derivatives and Prilosec [omeprazole]   Review of Systems Review of Systems  All systems reviewed and negative, other than as noted in HPI.  Physical Exam Updated Vital Signs Ht 5\' 10"  (1.778 m)   Wt 81.6 kg   BMI 25.83 kg/m   Physical Exam Vitals signs and nursing note reviewed.  Constitutional:      Appearance: He is well-developed.     Comments: Sitting in bed. Seems somewhat restless/uncomfortable. Scratching frequently.   HENT:     Head: Normocephalic and atraumatic.  Eyes:     General:        Right eye: No discharge.        Left eye: No discharge.     Conjunctiva/sclera: Conjunctivae normal.  Neck:     Musculoskeletal: Neck supple.  Cardiovascular:     Rate and Rhythm: Normal rate and regular rhythm.     Heart sounds: Normal heart  sounds. No murmur. No friction rub. No gallop.   Pulmonary:     Effort: Pulmonary effort is normal. No respiratory distress.     Breath sounds: Normal breath sounds.  Abdominal:     General: There is no distension.     Palpations: Abdomen is soft.     Tenderness: There is no abdominal tenderness.  Musculoskeletal:        General: No tenderness.  Skin:    General: Skin is warm and dry.     Findings: Rash present.     Comments: Sparse faintly erythematous reticular rash to b/l upper extremities and upper back. Several areas of excoriation with more chronic appearing areas of skin thickening to arms and shoulders.   Neurological:     Mental Status: He is alert.  Psychiatric:        Behavior: Behavior normal.        Thought Content: Thought content normal.      ED Treatments / Results  Labs (all labs ordered are listed, but only abnormal results are displayed) Labs Reviewed - No data to display  EKG EKG Interpretation  Date/Time:  Tuesday November 23 2018 06:56:53 EST Ventricular Rate:  67 PR Interval:    QRS Duration: 84 QT Interval:  441 QTC Calculation: 466 R Axis:   68 Text Interpretation:  Sinus rhythm Left ventricular hypertrophy Confirmed by Virgel Manifold [phone]) on 11/23/2018 7:02:14 AM   Radiology No results found.  Procedures Procedures (including critical care time)  Medications Ordered in ED Medications  dexamethasone (DECADRON) injection 10 mg (has no administration in time range)  hydrOXYzine (ATARAX/VISTARIL) tablet 25 mg (has no administration in time range)  LORazepam (ATIVAN) tablet 1 mg (has no administration in time range)     Initial Impression / Assessment and Plan / ED Course  I have reviewed the triage vital signs and the nursing notes.  Pertinent labs & imaging results that were available during my care of the patient were reviewed by me and considered in my medical decision making (see chart for details).     77yM with burning/itching  rash for weeks. No acute systemic complaints. Advised to try changing laundry detergents and will treat symptomatically otherwise.  Given his degree of discomfort, will give topical steroids and a few days or oral as well.   Final Clinical Impressions(s) / ED Diagnoses   Final diagnoses:  Rash    ED Discharge Orders    None       Virgel Manifold, MD 11/23/18 [id number]

## 2018-12-10 ENCOUNTER — Ambulatory Visit: Payer: Medicare Other | Admitting: Podiatry

## 2018-12-14 ENCOUNTER — Encounter: Payer: Self-pay | Admitting: Podiatry

## 2018-12-14 ENCOUNTER — Ambulatory Visit (INDEPENDENT_AMBULATORY_CARE_PROVIDER_SITE_OTHER): Payer: Medicare Other | Admitting: Podiatry

## 2018-12-14 DIAGNOSIS — B351 Tinea unguium: Secondary | ICD-10-CM | POA: Diagnosis not present

## 2018-12-14 DIAGNOSIS — M216X9 Other acquired deformities of unspecified foot: Secondary | ICD-10-CM | POA: Diagnosis not present

## 2018-12-14 DIAGNOSIS — M79676 Pain in unspecified toe(s): Secondary | ICD-10-CM | POA: Diagnosis not present

## 2018-12-14 DIAGNOSIS — Q828 Other specified congenital malformations of skin: Secondary | ICD-10-CM

## 2018-12-14 NOTE — Progress Notes (Signed)
Patient ID: Kenneth Roberts, male   DOB: June 24, 1941, 78 y.o.   MRN: [id number] Complaint:  Visit Type: Patient returns to my office for continued preventative foot care services. Complaint: Patient states" my nails have grown long and thick and become painful to walk and wear shoes" . The patient presents for preventative foot care services. No changes to ROS  Podiatric Exam: Vascular: dorsalis pedis and posterior tibial pulses are palpable bilateral. Capillary return is immediate. Temperature gradient is WNL. Skin turgor WNL  Sensorium: Normal Semmes Weinstein monofilament test. Normal tactile sensation bilaterally. Nail Exam: Pt has thick disfigured discolored nails with subungual debris noted bilateral entire nail hallux through fifth toenails Ulcer Exam: There is no evidence of ulcer or pre-ulcerative changes or infection. Orthopedic Exam: Muscle tone and strength are WNL. No limitations in general ROM. No crepitus or effusions noted. Foot type and digits show no abnormalities. Bony prominences are unremarkable. Skin: No Porokeratosis. No infection or ulcers.  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  Diagnosis:  Onychomycosis, , Pain in right toe, pain in left toes,  Porokeratosis  Treatment & Plan Procedures and Treatment: Consent by patient was obtained for treatment procedures. The patient understood the discussion of treatment and procedures well. All questions were answered thoroughly reviewed. Debridement of mycotic and hypertrophic toenails, 1 through 5 bilateral and clearing of subungual debris. No ulceration, no infection noted. Debride porokeratosis sub 5th met B/l Return Visit-Office Procedure: Patient instructed to return to the office for a follow up visit 9 weeks  for continued evaluation and treatment.   Gardiner Barefoot DPM

## 2019-01-14 ENCOUNTER — Other Ambulatory Visit (HOSPITAL_COMMUNITY): Payer: Self-pay | Admitting: Cardiology

## 2019-01-14 DIAGNOSIS — R079 Chest pain, unspecified: Secondary | ICD-10-CM

## 2019-01-24 ENCOUNTER — Ambulatory Visit (HOSPITAL_COMMUNITY): Payer: Medicare Other

## 2019-02-10 ENCOUNTER — Ambulatory Visit: Payer: Medicare Other | Admitting: Podiatry

## 2019-02-18 ENCOUNTER — Ambulatory Visit: Payer: Medicare Other | Admitting: Podiatry

## 2019-02-25 ENCOUNTER — Ambulatory Visit (HOSPITAL_COMMUNITY): Payer: Medicare Other

## 2019-03-09 ENCOUNTER — Ambulatory Visit: Payer: Medicare Other | Admitting: Podiatry

## 2019-03-11 ENCOUNTER — Ambulatory Visit: Payer: Medicare Other | Admitting: Podiatry

## 2019-03-18 ENCOUNTER — Ambulatory Visit (INDEPENDENT_AMBULATORY_CARE_PROVIDER_SITE_OTHER): Payer: Medicare Other | Admitting: Podiatry

## 2019-03-18 ENCOUNTER — Other Ambulatory Visit: Payer: Self-pay

## 2019-03-18 ENCOUNTER — Encounter: Payer: Self-pay | Admitting: Podiatry

## 2019-03-18 VITALS — Temp 97.9°F

## 2019-03-18 DIAGNOSIS — M79605 Pain in left leg: Secondary | ICD-10-CM | POA: Diagnosis not present

## 2019-03-18 DIAGNOSIS — M79604 Pain in right leg: Secondary | ICD-10-CM

## 2019-03-18 DIAGNOSIS — Q828 Other specified congenital malformations of skin: Secondary | ICD-10-CM

## 2019-03-18 DIAGNOSIS — M79676 Pain in unspecified toe(s): Secondary | ICD-10-CM

## 2019-03-18 DIAGNOSIS — B353 Tinea pedis: Secondary | ICD-10-CM

## 2019-03-18 DIAGNOSIS — R262 Difficulty in walking, not elsewhere classified: Secondary | ICD-10-CM | POA: Diagnosis not present

## 2019-03-18 DIAGNOSIS — B351 Tinea unguium: Secondary | ICD-10-CM

## 2019-03-18 MED ORDER — CICLOPIROX OLAMINE 0.77 % EX CREA: TOPICAL_CREAM | Freq: Two times a day (BID) | CUTANEOUS | 1 refills | Status: DC

## 2019-03-18 NOTE — Patient Instructions (Signed)

## 2019-03-28 ENCOUNTER — Encounter: Payer: Self-pay | Admitting: Podiatry

## 2019-03-28 NOTE — Progress Notes (Signed)
Subjective: Kenneth Roberts presents today with painful, thick toenails 1-5 b/l that he cannot cut and which interfere with daily activities.  Pain is aggravated when wearing enclosed shoe gear.  Patient also c/o painful plantar lesions submet head 5 b/l. Pain is aggravated when weightbearing which and without shoe gear. Pain resolves with periodic professional debridement.  Nolene Ebbs, MD is his PCP and last visit was a couple of months ago per patient's recall.   Allergies  Allergen Reactions  . Gadolinium Derivatives Other (See Comments)    unknown  . Prilosec [Omeprazole] Itching    Objective: Vitals:   03/18/19 1013  Temp: 97.9 F (36.6 C)   Vascular Examination: Capillary refill time immediate x 10 digits.  Dorsalis pedis and Posterior tibial pulses palpable b/l.  Digital hair sparse x 10 digits.  Skin temperature gradient WNL b/l.  Dermatological Examination: Skin with normal turgor, texture and tone b/l.  Toenails 1-5 b/l discolored, thick, dystrophic with subungual debris and pain with palpation to nailbeds due to thickness of nails.  Porokeratotic lesions submet head 5 b/l with tenderness to palpation. No erythema, no edema, no drainage, no flocculence.  Diffuse scaling noted peripherally and plantarly b/l feet with mild foot odor.  No interdigital macerations.  No blisters, no weeping. No signs of secondary bacterial infection noted.  Musculoskeletal: Muscle strength 5/5 to all LE muscle groups.  No gross bony deformities b/l.  No pain, crepitus or joint limitation noted with ROM.   Neurological: Sensation intact with 10 gram monofilament.  Vibratory sensation intact.  Assessment: Painful onychomycosis toenails 1-5 b/l  Porokeratoses submet head 5 b/l Tinea pedis Pain in limb  Plan: 1. Toenails 1-5 b/l were debrided in length and girth without iatrogenic bleeding. 2. Porokeratosis submet head 5 b/l pared and enucleated with sterile scalpel  blade without incident 3. Prescription written for ciclopirox cream 0.77%.  Patient is to apply to both feet and between toes twice daily for 28 days. 4. Patient to continue soft, supportive shoe gear daily. 5. Patient to report any pedal injuries to medical professional immediately. 6. Follow up 3 months.  7. Patient/POA to call should there be a concern in the interim.

## 2019-04-08 ENCOUNTER — Other Ambulatory Visit: Payer: Self-pay

## 2019-04-08 ENCOUNTER — Encounter (HOSPITAL_COMMUNITY)
Admission: RE | Admit: 2019-04-08 | Discharge: 2019-04-08 | Disposition: A | Discharge status: Home / Self Care | Payer: Medicare Other | Source: Ambulatory Visit | Attending: Cardiology | Admitting: Cardiology

## 2019-04-08 DIAGNOSIS — R079 Chest pain, unspecified: Secondary | ICD-10-CM | POA: Insufficient documentation

## 2019-04-08 MED ORDER — TECHNETIUM TC 99M TETROFOSMIN IV KIT
30.0000 | PACK | Freq: Once | INTRAVENOUS | Status: AC | PRN
  Administered 2019-04-08: 30 via INTRAVENOUS

## 2019-04-08 MED ORDER — REGADENOSON 0.4 MG/5ML IV SOLN
0.4000 mg | Freq: Once | INTRAVENOUS | Status: AC
  Administered 2019-04-08: 0.4 mg via INTRAVENOUS

## 2019-04-08 MED ORDER — REGADENOSON 0.4 MG/5ML IV SOLN
INTRAVENOUS | Status: AC
  Filled 2019-04-08: qty 5

## 2019-04-08 MED ORDER — TECHNETIUM TC 99M TETROFOSMIN IV KIT
10.0000 | PACK | Freq: Once | INTRAVENOUS | Status: AC | PRN
  Administered 2019-04-08: 10 via INTRAVENOUS

## 2019-05-20 ENCOUNTER — Ambulatory Visit: Payer: Medicare Other | Admitting: Podiatry

## 2019-05-27 ENCOUNTER — Other Ambulatory Visit: Payer: Self-pay

## 2019-05-27 ENCOUNTER — Ambulatory Visit (INDEPENDENT_AMBULATORY_CARE_PROVIDER_SITE_OTHER): Payer: Medicare Other | Admitting: Podiatry

## 2019-05-27 ENCOUNTER — Encounter: Payer: Self-pay | Admitting: Podiatry

## 2019-05-27 VITALS — Temp 97.3°F

## 2019-05-27 DIAGNOSIS — M79676 Pain in unspecified toe(s): Secondary | ICD-10-CM

## 2019-05-27 DIAGNOSIS — B351 Tinea unguium: Secondary | ICD-10-CM

## 2019-05-27 DIAGNOSIS — Q828 Other specified congenital malformations of skin: Secondary | ICD-10-CM

## 2019-05-27 DIAGNOSIS — M216X9 Other acquired deformities of unspecified foot: Secondary | ICD-10-CM | POA: Diagnosis not present

## 2019-05-27 NOTE — Progress Notes (Signed)
Patient ID: Kenneth Roberts, male   DOB: [birth year], 78 y.o.   MRN: [id number] Complaint:  Visit Type: Patient returns to my office for continued preventative foot care services. Complaint: Patient states" my nails have grown long and thick and become painful to walk and wear shoes" . The patient presents for preventative foot care services. No changes to ROS  Podiatric Exam: Vascular: dorsalis pedis and posterior tibial pulses are palpable bilateral. Capillary return is immediate. Temperature gradient is WNL. Skin turgor WNL  Sensorium: Normal Semmes Weinstein monofilament test. Normal tactile sensation bilaterally. Nail Exam: Pt has thick disfigured discolored nails with subungual debris noted bilateral entire nail hallux through fifth toenails Ulcer Exam: There is no evidence of ulcer or pre-ulcerative changes or infection. Orthopedic Exam: Muscle tone and strength are WNL. No limitations in general ROM. No crepitus or effusions noted. Foot type and digits show no abnormalities. Bony prominences are unremarkable. Skin: No Porokeratosis. No infection or ulcers.  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  Diagnosis:  Onychomycosis, , Pain in right toe, pain in left toes,  Porokeratosis  Treatment & Plan Procedures and Treatment: Consent by patient was obtained for treatment procedures. The patient understood the discussion of treatment and procedures well. All questions were answered thoroughly reviewed. Debridement of mycotic and hypertrophic toenails, 1 through 5 bilateral and clearing of subungual debris. No ulceration, no infection noted. Debride porokeratosis sub 5th met B/l Return Visit-Office Procedure: Patient instructed to return to the office for a follow up visit 9 weeks  for continued evaluation and treatment.   Gardiner Barefoot DPM

## 2019-06-03 ENCOUNTER — Other Ambulatory Visit: Payer: Self-pay | Admitting: Podiatry

## 2019-07-17 ENCOUNTER — Emergency Department (HOSPITAL_COMMUNITY): Payer: Medicare Other

## 2019-07-17 ENCOUNTER — Other Ambulatory Visit: Payer: Self-pay

## 2019-07-17 ENCOUNTER — Encounter (HOSPITAL_COMMUNITY): Payer: Self-pay

## 2019-07-17 ENCOUNTER — Emergency Department (HOSPITAL_COMMUNITY)
Admission: EM | Admit: 2019-07-17 | Discharge: 2019-07-17 | Disposition: A | Discharge status: Home / Self Care | Payer: Medicare Other | Attending: Emergency Medicine | Admitting: Emergency Medicine

## 2019-07-17 DIAGNOSIS — Z79899 Other long term (current) drug therapy: Secondary | ICD-10-CM | POA: Insufficient documentation

## 2019-07-17 DIAGNOSIS — I10 Essential (primary) hypertension: Secondary | ICD-10-CM | POA: Insufficient documentation

## 2019-07-17 DIAGNOSIS — F10929 Alcohol use, unspecified with intoxication, unspecified: Secondary | ICD-10-CM | POA: Diagnosis not present

## 2019-07-17 DIAGNOSIS — Z87891 Personal history of nicotine dependence: Secondary | ICD-10-CM | POA: Diagnosis not present

## 2019-07-17 DIAGNOSIS — Y906 Blood alcohol level of 120-199 mg/100 ml: Secondary | ICD-10-CM | POA: Diagnosis not present

## 2019-07-17 DIAGNOSIS — R1084 Generalized abdominal pain: Secondary | ICD-10-CM | POA: Diagnosis present

## 2019-07-17 DIAGNOSIS — F10920 Alcohol use, unspecified with intoxication, uncomplicated: Secondary | ICD-10-CM

## 2019-07-17 LAB — COMPREHENSIVE METABOLIC PANEL
ALT: 15 U/L (ref 0–44)
AST: 28 U/L (ref 15–41)
Albumin: 4.3 g/dL (ref 3.5–5.0)
Alkaline Phosphatase: 32 U/L — ABNORMAL LOW (ref 38–126)
Anion gap: 15 (ref 5–15)
BUN: 11 mg/dL (ref 8–23)
CO2: 21 mmol/L — ABNORMAL LOW (ref 22–32)
Calcium: 8.9 mg/dL (ref 8.9–10.3)
Chloride: 105 mmol/L (ref 98–111)
Creatinine, Ser: 0.88 mg/dL (ref 0.61–1.24)
GFR calc Af Amer: >60 mL/min (ref >60)
GFR calc non Af Amer: >60 mL/min (ref >60)
Glucose, Bld: 108 mg/dL — ABNORMAL HIGH (ref 70–99)
Potassium: 3.5 mmol/L (ref 3.5–5.1)
Sodium: 141 mmol/L (ref 135–145)
Total Bilirubin: 0.8 mg/dL (ref 0.3–1.2)
Total Protein: 7.9 g/dL (ref 6.5–8.1)

## 2019-07-17 LAB — CBC
HCT: 39.9 % (ref 39.0–52.0)
Hemoglobin: 14.2 g/dL (ref 13.0–17.0)
MCH: 32.1 pg (ref 26.0–34.0)
MCHC: 35.6 g/dL (ref 30.0–36.0)
MCV: 90.1 fL (ref 80.0–100.0)
Platelets: 203 10*3/uL (ref 150–400)
RBC: 4.43 MIL/uL (ref 4.22–5.81)
RDW: 14.6 % (ref 11.5–15.5)
WBC: 3.8 10*3/uL — ABNORMAL LOW (ref 4.0–10.5)
nRBC: 0 % (ref 0.0–0.2)

## 2019-07-17 LAB — URINALYSIS, ROUTINE W REFLEX MICROSCOPIC
Bilirubin Urine: NEGATIVE
Glucose, UA: NEGATIVE mg/dL
Hgb urine dipstick: NEGATIVE
Ketones, ur: 5 mg/dL — AB
Leukocytes,Ua: NEGATIVE
Nitrite: NEGATIVE
Protein, ur: NEGATIVE mg/dL
Specific Gravity, Urine: 1.01 (ref 1.005–1.030)
pH: 8 (ref 5.0–8.0)

## 2019-07-17 LAB — LIPASE, BLOOD: Lipase: 66 U/L — ABNORMAL HIGH (ref 11–51)

## 2019-07-17 LAB — ETHANOL: Alcohol, Ethyl (B): 171 mg/dL — ABNORMAL HIGH (ref <10)

## 2019-07-17 MED ORDER — ALUM & MAG HYDROXIDE-SIMETH 200-200-20 MG/5ML PO SUSP
30.0000 mL | Freq: Once | ORAL | Status: AC
  Administered 2019-07-17: 30 mL via ORAL
  Filled 2019-07-17: qty 30

## 2019-07-17 MED ORDER — IOHEXOL 300 MG/ML  SOLN
100.0000 mL | Freq: Once | INTRAMUSCULAR | Status: AC | PRN
  Administered 2019-07-17: 100 mL via INTRAVENOUS

## 2019-07-17 MED ORDER — SODIUM CHLORIDE 0.9% FLUSH
3.0000 mL | Freq: Once | INTRAVENOUS | Status: AC
  Administered 2019-07-17: 3 mL via INTRAVENOUS

## 2019-07-17 MED ORDER — DROPERIDOL 2.5 MG/ML IJ SOLN
1.2500 mg | Freq: Once | INTRAMUSCULAR | Status: AC
  Administered 2019-07-17: 1.25 mg via INTRAVENOUS
  Filled 2019-07-17: qty 2

## 2019-07-17 MED ORDER — SODIUM CHLORIDE 0.9 % IV BOLUS
500.0000 mL | Freq: Once | INTRAVENOUS | Status: AC
  Administered 2019-07-17: 09:00:00 500 mL via INTRAVENOUS

## 2019-07-17 MED ORDER — ONDANSETRON HCL 4 MG/2ML IJ SOLN
4.0000 mg | Freq: Once | INTRAMUSCULAR | Status: AC
  Administered 2019-07-17: 4 mg via INTRAVENOUS
  Filled 2019-07-17: qty 2

## 2019-07-17 MED ORDER — FAMOTIDINE 20 MG PO TABS
40.0000 mg | ORAL_TABLET | Freq: Once | ORAL | Status: AC
  Administered 2019-07-17: 40 mg via ORAL
  Filled 2019-07-17: qty 2

## 2019-07-17 MED ORDER — SODIUM CHLORIDE (PF) 0.9 % IJ SOLN
INTRAMUSCULAR | Status: AC
  Administered 2019-07-17: 08:00:00
  Filled 2019-07-17: qty 50

## 2019-07-17 NOTE — ED Notes (Signed)
Patient pulled cardiac monitor leads, pulse ox and BP cuff off. Pt up walking around in room. Pt asked if NT was going to remove his IV. NT responded no. NT asked the patient if he wanted to leave. Pt responded "well yea if it's time to go". Pt asking for an update on his results. Pt given water at this time for PO/fluid challenge.

## 2019-07-17 NOTE — ED Notes (Signed)
Patient continues to take off BP cuff. Patient is comfortable.

## 2019-07-17 NOTE — ED Triage Notes (Signed)
Pt BIB [facility] from home. Pt was non compliant with EMS, not answering questions, just stating "take me to the hospital."  Pt is c/o of abd pain. Pt stated he drank Gin last night and the abd started this morning. Patient is non-compliant with staff questions and treatment. Patient complaining of nausea.

## 2019-07-17 NOTE — ED Provider Notes (Signed)
[facility] DEPT Provider Note   CSN: [id number] Arrival date & time: 07/17/19  [id number]     History   Chief Complaint Chief Complaint  Patient presents with  . Abdominal Pain  . Alcohol Intoxication    HPI Kenneth Roberts is a 78 y.o. male.     78 year old male with past medical history below including hypertension, GERD, chronic headaches who presents with abdominal pain.  EMS reports that they picked the patient up from home and when they asked him what was wrong, he complained of abdominal pain.  He would not elaborate further, just repeating "take me to the hospital."  He has been reluctant to provide any information to staff.  He has complained of nausea but denies any vomiting.  He admitted to drinking gin last night.  He stated he did not think his abdominal pain was related to the alcohol but also admitted that he was not having any abdominal pain yesterday until after he drank alcohol.  LEVEL 5 CAVEAT DUE TO AMS  The history is provided by the patient and the EMS personnel.  Abdominal Pain Alcohol Intoxication Associated symptoms include abdominal pain.    Past Medical History:  Diagnosis Date  . Adenomatous colon polyp 01/2009  . Anemia   . BPH (benign prostatic hypertrophy)   . Cataract   . Chronic headaches   . Colon polyps   . Fatty liver   . GERD (gastroesophageal reflux disease)   . Heartburn   . Hepatic hemangioma   . Hiatal hernia   . Hypertension   . Internal hemorrhoids   . Leukopenia     Patient Active Problem List   Diagnosis Date Noted  . Hiatal hernia 12/25/2016  . Routine general medical examination at a health care facility 12/22/2015  . Precordial chest pain 12/20/2015  . 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 Lumbago 01/30/2014  . HLD (hyperlipidemia) 07/21/2013  . Hemorrhoids 04/14/2012  . BPH (benign prostatic hyperplasia) 04/14/2012  . PERSONAL HX COLONIC POLYPS 01/20/2011  . Essential hypertension  04/27/2007  . GERD 04/27/2007    Past Surgical History:  Procedure Laterality Date  . CATARACT EXTRACTION     Left eye  . COLONOSCOPY          Home Medications    Prior to Admission medications   Medication Sig Start Date End Date Taking? Authorizing Provider  amLODipine (NORVASC) 5 MG tablet Take 5 mg by mouth daily. 05/21/18   [provider]  BANOPHEN 25 MG capsule TAKE 2 CAPSULES (50 MG) BY MOUTH ONCE DAILY AT BEDTIME AS NEEDED 01/04/19   [provider]  busPIRone (BUSPAR) 15 MG tablet TAKE 1 TABLET BY MOUTH AT BEDTIME 07/26/18   Armbruster, Carlota Raspberry, MD  fluocinonide ointment (LIDEX) 0.05 % APPLY TO THE AFFECTED AREA(S) BY TOPICAL ROUTE 2 TIMES PER DAY. (DO NOT APPLY TO GENITALS, ARMPITS, OR FACE). 10/28/18   [provider]  hydrOXYzine (ATARAX/VISTARIL) 25 MG tablet Take 1 tablet (25 mg total) by mouth every 6 (six) hours as needed. 11/23/18   Virgel Manifold, MD  losartan (COZAAR) 25 MG tablet Take 25 mg by mouth daily. 05/05/19   [provider]  pantoprazole (PROTONIX) 40 MG tablet Take 40 mg by mouth daily.  09/14/18   [provider]  predniSONE (DELTASONE) 20 MG tablet Take 2 tablets (40 mg total) by mouth daily. 11/23/18   Virgel Manifold, MD  sucralfate (CARAFATE) 1 g tablet Take 1 tablet (1 g total) by  mouth 4 (four) times daily -  with meals and at bedtime. 04/30/18   Armbruster, Carlota Raspberry, MD  tamsulosin (FLOMAX) 0.4 MG CAPS capsule Take 0.4 mg by mouth daily. 05/05/19   [provider]  triamcinolone cream (KENALOG) 0.5 % APPLY CREAM TOPICALLY TWICE A DAY AS NEEDED 04/20/19   [provider]    Family History Family History  Problem Relation Age of Onset  . Stomach cancer Mother   . Diabetes Father   . Diabetes Brother   . Diabetes Sister   . Colon cancer Neg Hx   . Colon polyps Neg Hx   . Esophageal cancer Neg Hx     Social History Social History   Tobacco Use  . Smoking status: Former Smoker    Quit  date: 02/16/1981    Years since quitting: 38.4  . Smokeless tobacco: Never Used  Substance Use Topics  . Alcohol use: Yes    Alcohol/week: 2.0 - 4.0 standard drinks    Types: 2 - 4 Glasses of wine per week    Comment: 4-5 drinks week  . Drug use: No     Allergies   Gadolinium derivatives and Prilosec [omeprazole]   Review of Systems Review of Systems  Unable to perform ROS: Mental status change  Gastrointestinal: Positive for abdominal pain.     Physical Exam Updated Vital Signs BP (!) 151/95   Pulse 97   Temp 98.4 F (36.9 C)   Resp (!) 22   Ht 6\' 1"  (1.854 m)   Wt 83.9 kg   SpO2 97%   BMI 24.41 kg/m   Physical Exam Vitals signs and nursing note reviewed.  Constitutional:      General: He is not in acute distress.    Appearance: He is well-developed.     Comments: asleep  HENT:     Head: Normocephalic and atraumatic.     Mouth/Throat:     Mouth: Mucous membranes are moist.  Eyes:     Conjunctiva/sclera: Conjunctivae normal.  Neck:     Musculoskeletal: Neck supple.  Cardiovascular:     Rate and Rhythm: Normal rate and regular rhythm.     Heart sounds: Normal heart sounds. No murmur.  Pulmonary:     Effort: Pulmonary effort is normal.     Breath sounds: Normal breath sounds.  Abdominal:     General: Bowel sounds are normal. There is no distension.     Palpations: Abdomen is soft.     Tenderness: There is generalized abdominal tenderness.  Skin:    General: Skin is warm and dry.  Neurological:     Comments: Oriented to person, intoxicated, slurred speech  Psychiatric:        Judgment: Judgment normal.     Comments: When awakened, starts shaking and becomes anxious, behaviors stop when he falls back asleep      ED Treatments / Results  Labs (all labs ordered are listed, but only abnormal results are displayed) Labs Reviewed  LIPASE, BLOOD - Abnormal; Notable for the following components:      Result Value   Lipase 66 (*)    All other  components within normal limits  COMPREHENSIVE METABOLIC PANEL - Abnormal; Notable for the following components:   CO2 21 (*)    Glucose, Bld 108 (*)    Alkaline Phosphatase 32 (*)    All other components within normal limits  CBC - Abnormal; Notable for the following components:   WBC 3.8 (*)    All  other components within normal limits  URINALYSIS, ROUTINE W REFLEX MICROSCOPIC - Abnormal; Notable for the following components:   Ketones, ur 5 (*)    All other components within normal limits  ETHANOL - Abnormal; Notable for the following components:   Alcohol, Ethyl (B) 171 (*)    All other components within normal limits    EKG None  Radiology Ct Abdomen Pelvis W Contrast  Result Date: 07/17/2019 CLINICAL DATA:  Acute generalized abdominal pain since this morning. Nausea. EXAM: CT ABDOMEN AND PELVIS WITH CONTRAST TECHNIQUE: Multidetector CT imaging of the abdomen and pelvis was performed using the standard protocol following bolus administration of intravenous contrast. CONTRAST:  129mL OMNIPAQUE IOHEXOL 300 MG/ML  SOLN COMPARISON:  01/05/2016 and 01/19/2012 FINDINGS: Lower chest: Scarring right base. Hepatobiliary: Stable 2.2 cm hemangioma over the left lobe. Gallbladder and biliary tree are normal. Pancreas: Normal. Spleen: Normal. Adrenals/Urinary Tract: Adrenal glands are normal. Kidneys are normal in size without hydronephrosis or nephrolithiasis. Subcentimeter left renal cortical hypodensity too small to characterize but likely a cyst. Ureters and bladder are normal. Stomach/Bowel: Possible small hiatal hernia. Stomach is otherwise unremarkable. Small bowel is normal. Appendix is normal. Colon is unremarkable. Vascular/Lymphatic: Normal. Reproductive: Normal. Other: No free fluid or focal inflammatory change. Musculoskeletal: Degenerative change of the spine. IMPRESSION: No acute findings in the abdomen/pelvis. 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 Subcentimeter left renal  cortical hypodensity too small to characterize but likely a cyst. Electronically Signed   By: Marin Olp M.D.   On: 07/17/2019 08:46    Procedures Procedures (including critical care time)  Medications Ordered in ED Medications  sodium chloride flush (NS) 0.9 % injection 3 mL (3 mLs Intravenous Given 07/17/19 0700)  sodium chloride 0.9 % bolus 500 mL (0 mLs Intravenous Stopped 07/17/19 1017)  ondansetron (ZOFRAN) injection 4 mg (4 mg Intravenous Given 07/17/19 0909)  iohexol (OMNIPAQUE) 300 MG/ML solution 100 mL (100 mLs Intravenous Contrast Given 07/17/19 0814)  sodium chloride (PF) 0.9 % injection (  Given by Other 07/17/19 0829)  alum & mag hydroxide-simeth (MAALOX/MYLANTA) 200-200-20 MG/5ML suspension 30 mL (30 mLs Oral Given 07/17/19 0910)  famotidine (PEPCID) tablet 40 mg (40 mg Oral Given 07/17/19 0909)  droperidol (INAPSINE) 2.5 MG/ML injection 1.25 mg (1.25 mg Intravenous Given 07/17/19 1110)     Initial Impression / Assessment and Plan / ED Course  I have reviewed the triage vital signs and the nursing notes.  Pertinent labs & imaging results that were available during my care of the patient were reviewed by me and considered in my medical decision making (see chart for details).        PT was asleep and comfortable when I entered room. When awakened, he then began shaking and stating "help me, something is wrong." The shaking resolved when he fell back asleep. I doubt acute alcohol withdrawal based on waxing and waning sx and reassuring VS. He was not very cooperative in providing any extra history. Given abd pain, obtained CT which was negative for acute process. Labs show lipase 66, normal LFTs, WBC 3.8, no signs of dehydration, ETOH 171. Pt metabolized in ED and was alert, sitting up, well appearing on reassessment after GI cocktail.  Recommended avoidance of alcohol and low acid diet until symptoms improved.  I have extensively reviewed return precautions and he voiced  understanding.  Final Clinical Impressions(s) / ED Diagnoses   Final diagnoses:  Alcoholic intoxication without complication (HCC)  Generalized abdominal pain  ED Discharge Orders    None       Corrina Steffensen, Wenda Overland, MD 07/17/19 1326

## 2019-07-17 NOTE — ED Notes (Signed)
ED Provider at bedside. 

## 2019-07-17 NOTE — ED Notes (Signed)
Patient is appearing to force himself to cough which is then causing patient to gag without physically vomiting.

## 2019-08-23 ENCOUNTER — Ambulatory Visit: Payer: Medicare Other | Admitting: Podiatry

## 2019-08-26 ENCOUNTER — Other Ambulatory Visit: Payer: Self-pay | Admitting: Podiatry

## 2019-08-26 ENCOUNTER — Encounter: Payer: Self-pay | Admitting: Podiatry

## 2019-08-26 ENCOUNTER — Ambulatory Visit (INDEPENDENT_AMBULATORY_CARE_PROVIDER_SITE_OTHER): Payer: Medicare Other | Admitting: Podiatry

## 2019-08-26 ENCOUNTER — Other Ambulatory Visit: Payer: Self-pay

## 2019-08-26 ENCOUNTER — Ambulatory Visit (INDEPENDENT_AMBULATORY_CARE_PROVIDER_SITE_OTHER): Payer: Medicare Other

## 2019-08-26 DIAGNOSIS — M79675 Pain in left toe(s): Secondary | ICD-10-CM

## 2019-08-26 DIAGNOSIS — M19071 Primary osteoarthritis, right ankle and foot: Secondary | ICD-10-CM | POA: Diagnosis not present

## 2019-08-26 DIAGNOSIS — M79674 Pain in right toe(s): Secondary | ICD-10-CM | POA: Diagnosis not present

## 2019-08-26 DIAGNOSIS — M79671 Pain in right foot: Secondary | ICD-10-CM

## 2019-08-26 DIAGNOSIS — B351 Tinea unguium: Secondary | ICD-10-CM | POA: Diagnosis not present

## 2019-08-26 NOTE — Progress Notes (Signed)
  Subjective:  Patient ID: Kenneth Roberts, male    DOB: [birth year],  MRN: [id number]  Chief Complaint  Patient presents with  . Foot Pain    Pt states sudden onset of right dorsal foot pain 3 days ago "Just woke up with this pain", does not know any cause. Pt states some numbness in this area.  . Nail Problem    Nail trim 1-5 bilateral   78 y.o. male returns for the above complaint.  He states that he has a painful bilateral onychomycotic nails.  Duration she states that it occurs at night especially when he is wearing his shoes.  He has been doing soaks to help control the thickness of the nails.  He also has secondary complaint of right first MPJ pain.  This pain is acute on onset has been going on for last couple of days.  He just woke up with the pain the pain is more dull aching in nature not stabbing shooting.  Denies any other complaints no nausea fever chills vomiting.  Objective:  There were no vitals filed for this visit. Podiatric Exam: Vascular: dorsalis pedis and posterior tibial pulses are palpable bilateral. Capillary return is immediate. Temperature gradient is WNL. Skin turgor WNL  Sensorium: Normal Semmes Weinstein monofilament test. Normal tactile sensation bilaterally. Nail Exam: Pt has thick disfigured discolored nails with subungual debris noted bilateral entire nail hallux through fifth toenails Ulcer Exam: There is no evidence of ulcer or pre-ulcerative changes or infection. Orthopedic Exam: Muscle tone and strength are WNL. No limitations in general ROM. No crepitus or effusions noted. HAV  B/L.  Hammer toes 2-5  B/L. Skin: No Porokeratosis. No infection or ulcers  Assessment & Plan:  Patient was evaluated and treated and all questions answered.  Right first MPJ arthritis -All questions and concerns were addressed -X-rays 3 views of skeletally mature adult were reviewed: Moderate degenerative joint disease of the right first MPJ noted.  No spurring or osteophytes  noted.  Uneven joint space narrowing noted at the right first MPJ.  There is an increasing soft tissue density surrounding circumferentially around the first MPJ. -I explained to the patient the etiology and all the various treatment options available for treating first MPJ arthritis -A steroid injection was performed at right first MPJ using 1% plain Lidocaine and 10 mg of Kenalog. This was well tolerated.  Onychomycosis with pain  -Nails palliatively debrided as below. -Educated on self-care  Procedure: Nail Debridement Rationale: pain  Type of Debridement: manual, sharp debridement. Instrumentation: Nail nipper, rotary burr. Number of Nails: 10  Procedures and Treatment: Consent by patient was obtained for treatment procedures. The patient understood the discussion of treatment and procedures well. All questions were answered thoroughly reviewed. Debridement of mycotic and hypertrophic toenails, 1 through 5 bilateral and clearing of subungual debris. No ulceration, no infection noted.  Return Visit-Office Procedure: Patient instructed to return to the office for a follow up visit 3 months for continued evaluation and treatment.  Boneta Lucks, DPM    No follow-ups on file.

## 2019-09-01 ENCOUNTER — Telehealth: Payer: Self-pay | Admitting: Gastroenterology

## 2019-09-01 ENCOUNTER — Encounter: Payer: Self-pay | Admitting: Gastroenterology

## 2019-09-01 ENCOUNTER — Other Ambulatory Visit: Payer: Self-pay

## 2019-09-01 ENCOUNTER — Ambulatory Visit (INDEPENDENT_AMBULATORY_CARE_PROVIDER_SITE_OTHER): Payer: Medicare Other | Admitting: Gastroenterology

## 2019-09-01 VITALS — BP 134/90 | HR 73 | Temp 98.1°F | Ht 70.0 in | Wt 193.3 lb

## 2019-09-01 DIAGNOSIS — F1010 Alcohol abuse, uncomplicated: Secondary | ICD-10-CM

## 2019-09-01 DIAGNOSIS — R140 Abdominal distension (gaseous): Secondary | ICD-10-CM | POA: Diagnosis not present

## 2019-09-01 DIAGNOSIS — R1013 Epigastric pain: Secondary | ICD-10-CM | POA: Diagnosis not present

## 2019-09-01 MED ORDER — RIFAXIMIN 550 MG PO TABS: 550.0000 mg | ORAL_TABLET | Freq: Two times a day (BID) | ORAL | 0 refills | Status: AC

## 2019-09-01 NOTE — Telephone Encounter (Signed)
Called and spoke to patient.  He is concerned about possible side effects of Xifaxan. Pt was instructed that side effects (such as an allergic reaction with rash, itching, swelling or dizziness, difficulty breathing) are very uncommon side effects and all medications have risks but that the potential benefits are significant and the reason this medication has been prescribed.  He understood but said that he has recently gotten over Shingles and does not want to go back to where he came. Pt was encouraged that Xifaxan should not cause any reoccurrence of Shingles. He was asked to let us know if he decides he is not comfortable taking the medication. (Samples were given and should be returned if he decides not to take it).

## 2019-09-01 NOTE — Telephone Encounter (Signed)
Called and spoke to pt. Instructed him to take Xifaxan twice a day about 12 hours apart, with or without food.  He expressed understanding.

## 2019-09-01 NOTE — Progress Notes (Signed)
HPI :  78 year old male here for follow-up visit.  He has been seen in the past for chronic abdominal discomfort, bloating, dyspepsia, globus.  See prior notes for details of his case, he has had an extensive evaluation to date with prior endoscopy, colonoscopy, capsule study, multiple CT scans and lab work, GES, ENT evaluation.  He has had trials of probiotics, TCAs, buspirone in the past.  His main complaint today is bloating and continued discomfort in his abdomen at times.  He states this is similar to the symptoms he has had over the years.  He states he feels a fairly frequent bloating sensation in his abdomen which can cause distention.  He has roughly 3-4 bowel movements a day, no blood in the stool.  He states after he has a bowel movement he feels somewhat better.  He is actually eating fairly well right now, denies any nausea or vomiting.  Weight is stable.  He has been taking omeprazole 20 mg a day for dyspepsia which actually seems fairly well controlled today.  He denies any reflux or heartburn, no nausea or vomiting.  At the last visit I gave him buspirone however he stopped taking it.  He has multiple intolerances in the past.  Most recently the patient was seen in the emergency room on September 13.  He was intoxicated and brought to the ER with mental status change.  He had labs done at the time which did not show any significant abnormalities other than a mildly elevated lipase.  He had a CT scan done of his abdomen and pelvis with contrast which showed no acute findings.  He has no evidence of cirrhosis, he does have a 2.2 cm hemangioma in the liver and a likely renal cyst.  His bowel appeared normal.  He states he drinks about 2 beers per day at baseline.  He most recently had a colonoscopy in 2017 which did not show any adenomatous polyps.  He does have diverticulosis.  Most recent workup: Colonoscopy 02/26/16 - diverticulosis, hemorrhoids, 70mm ascending colon polyp which was  benign inflammatory polyp Colonoscopy 01/02/2016 - poor prep EGD 5/16 - irregular z-line, small hiatal hernia, normal stomach and duodenum - biopsies normal, no BE or celiac Colonoscopy May 2016 - poor prep Colonoscopy 2010 - adenoma CT scan 01/2016 - nocause for symptoms noted, benign hepatic hemagioma UGI series 2008- - nonspecific dysmotility, 91mm tablet got stuck at aortic arch Gastric emptying study 06/03/2017 - normal CT scan abdomen / pelvis with contrast 07/23/2017 - 1.8cm hemangioma left hepatic lobe, mild DJD, no acute findings CT scan 07/17/19 - stable 2.2cm hepatic hemangioma, no concerning pathology   Past Medical History:  Diagnosis Date  . Adenomatous colon polyp 01/2009  . Anemia   . BPH (benign prostatic hypertrophy)   . Cataract   . Chronic headaches   . Colon polyps   . Fatty liver   . GERD (gastroesophageal reflux disease)   . Heartburn   . Hepatic hemangioma   . Hiatal hernia   . Hypertension   . Internal hemorrhoids   . Leukopenia      Past Surgical History:  Procedure Laterality Date  . CATARACT EXTRACTION     Left eye  . COLONOSCOPY     Family History  Problem Relation Age of Onset  . Stomach cancer Mother   . Diabetes Father   . Diabetes Brother   . Diabetes Sister   . Colon cancer Neg Hx   . Colon polyps Neg  Hx   . Esophageal cancer Neg Hx    Social History   Tobacco Use  . Smoking status: Former Smoker    Quit date: 02/16/1981    Years since quitting: 38.5  . Smokeless tobacco: Never Used  Substance Use Topics  . Alcohol use: Yes    Alcohol/week: 2.0 - 4.0 standard drinks    Types: 2 - 4 Glasses of wine per week    Comment: 4-5 drinks week  . Drug use: No   Current Outpatient Medications  Medication Sig Dispense Refill  . amLODipine (NORVASC) 5 MG tablet Take 5 mg by mouth daily.  3  . omeprazole (PRILOSEC) 20 MG capsule Take 20 mg by mouth every morning.    . tamsulosin (FLOMAX) 0.4 MG CAPS capsule Take 0.4 mg by mouth daily.      No current facility-administered medications for this visit.    Allergies  Allergen Reactions  . Gadolinium Derivatives Other (See Comments)    unknown  . Prilosec [Omeprazole] Itching     Review of Systems: All systems reviewed and negative except where noted in HPI.    Dg Foot Complete Right  Result Date: 08/26/2019 Please see detailed radiograph report in office note.  Lab Results  Component Value Date   WBC 3.8 (L) 07/17/2019   HGB 14.2 07/17/2019   HCT 39.9 07/17/2019   MCV 90.1 07/17/2019   PLT 203 07/17/2019    Lab Results  Component Value Date   CREATININE 0.88 07/17/2019   BUN 11 07/17/2019   NA 141 07/17/2019   K 3.5 07/17/2019   CL 105 07/17/2019   CO2 21 (L) 07/17/2019    Lab Results  Component Value Date   ALT 15 07/17/2019   AST 28 07/17/2019   ALKPHOS 32 (L) 07/17/2019   BILITOT 0.8 07/17/2019     Physical Exam: BP 134/90 (BP Location: Left Arm, Patient Position: Sitting, Cuff Size: Normal)   Pulse 73   Temp 98.1 F (36.7 C) (Oral)   Ht 5\' 10"  (1.778 m)   Wt 193 lb 5 oz (87.7 kg)   BMI 27.74 kg/m  Constitutional: Pleasant,well-developed, male in no acute distress. HEENT: Normocephalic and atraumatic. Conjunctivae are normal. No scleral icterus. Neck supple.  Cardiovascular: Normal rate, regular rhythm.  Pulmonary/chest: Effort normal and breath sounds normal. No wheezing, rales or rhonchi. Abdominal: Soft, nondistended, nontender. There are no masses palpable. No hepatomegaly. Extremities: no edema Lymphadenopathy: No cervical adenopathy noted. Neurological: Alert and oriented to person place and time. Skin: Skin is warm and dry. No rashes noted. Psychiatric: Normal mood and affect. Behavior is normal.   ASSESSMENT AND PLAN: 78 year old male here for reassessment of the following:  Bloating / Dyspepsia / alcohol abuse - patient has a chronic functional disorder based on chronicity of symptoms, negative work-up to date which was  extensive as outlined above.  Recently presented intoxicated to the emergency room with mental status changes, he has done this in the past, labs looked okay, CT scan abdomen pelvis fairly normal.  I reassured him I do not see anything concerning going on in regards to malignancy, IBD, etc. we discussed pathophysiology behind intestinal gas and bloating.  He has tried probiotics and TCAs in the past without much benefit.  I had a free sample of rifaximin available today and will give him a 2-week course of rifaximin to see if that will help some of his bloating symptoms.  Otherwise he will continue his omeprazole for dyspepsia, seems to be  working okay at this time.  He stopped buspirone although has a difficult time recalling why or when.  Otherwise I counseled him on his alcohol use, and not sure if this is playing a role in symptom of his symptoms as well.  I recommend he reduce or abstain if possible and see if that makes him feel better.  He agreed and can follow-up as needed.   Cellar, MD [facility]

## 2019-09-01 NOTE — Telephone Encounter (Signed)
Pt would like to discuss timing to take his medications.

## 2019-09-01 NOTE — Patient Instructions (Addendum)
If you are age 76 or older, your body mass index should be between 23-30. Your Body mass index is 27.74 kg/m. If this is out of the aforementioned range listed, please consider follow up with your Primary Care Provider.  If you are age 43 or younger, your body mass index should be between 19-25. Your Body mass index is 27.74 kg/m. If this is out of the aformentioned range listed, please consider follow up with your Primary Care Provider.   To help prevent the possible spread of infection to our patients, communities, and staff; we will be implementing the following measures:  As of now we are not allowing any visitors/family members to accompany you to any upcoming appointments with [facility]. If you have any concerns about this please contact our office to discuss prior to the appointment.   We are giving you Samples of Xifaxan 550mg : Take twice a day for 15 days.  Thank you for entrusting me with your care and for choosing [facility], Dr. [place] Cellar

## 2019-09-02 NOTE — Telephone Encounter (Signed)
Thanks Jan. Rifaximin is quite safe, will not make the shingles come back. Thanks

## 2019-09-09 ENCOUNTER — Ambulatory Visit: Payer: Medicare Other | Admitting: Podiatry

## 2019-09-14 ENCOUNTER — Ambulatory Visit: Payer: Medicare Other | Admitting: Podiatry

## 2019-09-26 ENCOUNTER — Telehealth: Payer: Self-pay | Admitting: Gastroenterology

## 2019-09-26 NOTE — Telephone Encounter (Signed)
Patient was given a course of Xifaxan 550mg  to be taken for  2 weeks. Called and spoke to pt. He said it has really been helping.  He expressed understanding to complete the course and we will not refill as this is a course of antibiotics that are not intended to be taken continually.

## 2019-09-26 NOTE — Telephone Encounter (Signed)
Dr. Havery Moros patient

## 2019-10-03 ENCOUNTER — Emergency Department (HOSPITAL_COMMUNITY)
Admission: EM | Admit: 2019-10-03 | Discharge: 2019-10-03 | Disposition: A | Discharge status: Home / Self Care | Payer: Medicare Other | Attending: Emergency Medicine | Admitting: Emergency Medicine

## 2019-10-03 ENCOUNTER — Other Ambulatory Visit: Payer: Self-pay

## 2019-10-03 ENCOUNTER — Emergency Department (HOSPITAL_COMMUNITY): Payer: Medicare Other

## 2019-10-03 ENCOUNTER — Encounter (HOSPITAL_COMMUNITY): Payer: Self-pay

## 2019-10-03 DIAGNOSIS — I10 Essential (primary) hypertension: Secondary | ICD-10-CM | POA: Insufficient documentation

## 2019-10-03 DIAGNOSIS — Z87891 Personal history of nicotine dependence: Secondary | ICD-10-CM | POA: Insufficient documentation

## 2019-10-03 DIAGNOSIS — R1084 Generalized abdominal pain: Secondary | ICD-10-CM | POA: Diagnosis present

## 2019-10-03 DIAGNOSIS — Z79899 Other long term (current) drug therapy: Secondary | ICD-10-CM | POA: Insufficient documentation

## 2019-10-03 DIAGNOSIS — U071 COVID-19: Secondary | ICD-10-CM | POA: Diagnosis not present

## 2019-10-03 LAB — COMPREHENSIVE METABOLIC PANEL
ALT: 15 U/L (ref 0–44)
AST: 32 U/L (ref 15–41)
Albumin: 4.4 g/dL (ref 3.5–5.0)
Alkaline Phosphatase: 34 U/L — ABNORMAL LOW (ref 38–126)
Anion gap: 18 — ABNORMAL HIGH (ref 5–15)
BUN: 12 mg/dL (ref 8–23)
CO2: 21 mmol/L — ABNORMAL LOW (ref 22–32)
Calcium: 9.6 mg/dL (ref 8.9–10.3)
Chloride: 100 mmol/L (ref 98–111)
Creatinine, Ser: 1.03 mg/dL (ref 0.61–1.24)
GFR calc Af Amer: >60 mL/min (ref >60)
GFR calc non Af Amer: >60 mL/min (ref >60)
Glucose, Bld: 145 mg/dL — ABNORMAL HIGH (ref 70–99)
Potassium: 4.1 mmol/L (ref 3.5–5.1)
Sodium: 139 mmol/L (ref 135–145)
Total Bilirubin: 0.3 mg/dL (ref 0.3–1.2)
Total Protein: 7.5 g/dL (ref 6.5–8.1)

## 2019-10-03 LAB — LIPASE, BLOOD: Lipase: 72 U/L — ABNORMAL HIGH (ref 11–51)

## 2019-10-03 LAB — URINALYSIS, ROUTINE W REFLEX MICROSCOPIC
Bacteria, UA: NONE SEEN
Bilirubin Urine: NEGATIVE
Glucose, UA: NEGATIVE mg/dL
Hgb urine dipstick: NEGATIVE
Ketones, ur: 5 mg/dL — AB
Leukocytes,Ua: NEGATIVE
Nitrite: NEGATIVE
Protein, ur: 30 mg/dL — AB
Specific Gravity, Urine: 1.012 (ref 1.005–1.030)
pH: 8 (ref 5.0–8.0)

## 2019-10-03 LAB — CBC
HCT: 43.8 % (ref 39.0–52.0)
Hemoglobin: 15.3 g/dL (ref 13.0–17.0)
MCH: 31.7 pg (ref 26.0–34.0)
MCHC: 34.9 g/dL (ref 30.0–36.0)
MCV: 90.9 fL (ref 80.0–100.0)
Platelets: 174 10*3/uL (ref 150–400)
RBC: 4.82 MIL/uL (ref 4.22–5.81)
RDW: 14.6 % (ref 11.5–15.5)
WBC: 3.1 10*3/uL — ABNORMAL LOW (ref 4.0–10.5)
nRBC: 0 % (ref 0.0–0.2)

## 2019-10-03 LAB — SARS CORONAVIRUS 2 (TAT 6-24 HRS): SARS Coronavirus 2: POSITIVE — AB

## 2019-10-03 MED ORDER — LORAZEPAM 2 MG/ML IJ SOLN
1.0000 mg | Freq: Once | INTRAMUSCULAR | Status: AC
  Administered 2019-10-03: 1 mg via INTRAVENOUS
  Filled 2019-10-03: qty 1

## 2019-10-03 MED ORDER — SODIUM CHLORIDE 0.9 % IV BOLUS
1000.0000 mL | Freq: Once | INTRAVENOUS | Status: AC
  Administered 2019-10-03: 09:00:00 1000 mL via INTRAVENOUS

## 2019-10-03 MED ORDER — SODIUM CHLORIDE (PF) 0.9 % IJ SOLN
INTRAMUSCULAR | Status: AC
  Filled 2019-10-03: qty 50

## 2019-10-03 MED ORDER — SODIUM CHLORIDE 0.9% FLUSH: 3.0000 mL | Freq: Once | INTRAVENOUS | Status: DC

## 2019-10-03 MED ORDER — IOHEXOL 300 MG/ML  SOLN
100.0000 mL | Freq: Once | INTRAMUSCULAR | Status: AC | PRN
  Administered 2019-10-03: 100 mL via INTRAVENOUS

## 2019-10-03 MED ORDER — MORPHINE SULFATE (PF) 4 MG/ML IV SOLN
4.0000 mg | Freq: Once | INTRAVENOUS | Status: AC
  Administered 2019-10-03: 4 mg via INTRAVENOUS
  Filled 2019-10-03: qty 1

## 2019-10-03 NOTE — ED Triage Notes (Signed)
Pt reports generalized abdominal pain and distention. He noticed increased pain 30 mins ago. Not tender. Dry heaving in triage. Denies diarrhea. He states that the same thing happened a few months ago, but no one told him what it was.

## 2019-10-03 NOTE — ED Notes (Signed)
NP student  at bedside

## 2019-10-03 NOTE — ED Provider Notes (Signed)
[place] DEPT Provider Note   CSN: [id number] Arrival date & time: 10/03/19  0259     History   Chief Complaint Chief Complaint  Patient presents with  . Abdominal Pain    HPI Kenneth Roberts is a 78 y.o. male.     The history is provided by the patient and medical records. No language interpreter was used.  Abdominal Pain    78 year old male with history of fatty liver, GERD, hiatal hernia, chronic headache presenting for evaluation of abdominal pain.  Patient developed diffuse abdominal pain radiates to his back that started since yesterday.  Pain is 10 out of 10, achy, sharp, persistent with associated tremors.  Patient report feeling nauseous, dry heaving.  Denies having fever, productive cough, shortness of breath or dysuria.  Last bowel movement was yesterday.  Admits to consuming 2-3 beers yesterday.  Denies any recent sick contact.  He denies any drug use.    Past Medical History:  Diagnosis Date  . Adenomatous colon polyp 01/2009  . Anemia   . BPH (benign prostatic hypertrophy)   . Cataract   . Chronic headaches   . Colon polyps   . Fatty liver   . GERD (gastroesophageal reflux disease)   . Heartburn   . Hepatic hemangioma   . Hiatal hernia   . Hypertension   . Internal hemorrhoids   . Leukopenia     Patient Active Problem List   Diagnosis Date Noted  . Hiatal hernia 12/25/2016  . Routine general medical examination at a health care facility 12/22/2015  . Precordial chest pain 12/20/2015  . 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 Lumbago 01/30/2014  . HLD (hyperlipidemia) 07/21/2013  . Hemorrhoids 04/14/2012  . BPH (benign prostatic hyperplasia) 04/14/2012  . PERSONAL HX COLONIC POLYPS 01/20/2011  . Essential hypertension 04/27/2007  . GERD 04/27/2007    Past Surgical History:  Procedure Laterality Date  . CATARACT EXTRACTION     Left eye  . COLONOSCOPY          Home Medications    Prior to Admission  medications   Medication Sig Start Date End Date Taking? Authorizing Provider  amLODipine (NORVASC) 5 MG tablet Take 5 mg by mouth daily. 05/21/18  Yes [provider]  gabapentin (NEURONTIN) 100 MG capsule Take 100 mg by mouth at bedtime.   Yes [provider]  latanoprost (XALATAN) 0.005 % ophthalmic solution Place 1 drop into both eyes at bedtime. 08/29/19  Yes [provider]  losartan (COZAAR) 25 MG tablet Take 25 mg by mouth daily.   Yes [provider]  omeprazole (PRILOSEC) 20 MG capsule Take 20 mg by mouth every morning. 08/25/19  Yes [provider]  tamsulosin (FLOMAX) 0.4 MG CAPS capsule Take 0.4 mg by mouth daily. 05/05/19  Yes [provider]  terbinafine (LAMISIL) 250 MG tablet Take 250 mg by mouth daily.   Yes [provider]    Family History Family History  Problem Relation Age of Onset  . Stomach cancer Mother   . Diabetes Father   . Diabetes Brother   . Diabetes Sister   . Colon cancer Neg Hx   . Colon polyps Neg Hx   . Esophageal cancer Neg Hx     Social History Social History   Tobacco Use  . Smoking status: Former Smoker    Quit date: 02/16/1981    Years since quitting: 38.6  . Smokeless tobacco: Never Used  Substance Use Topics  .  Alcohol use: Yes    Alcohol/week: 2.0 - 4.0 standard drinks    Types: 2 - 4 Glasses of wine per week    Comment: 4-5 drinks week  . Drug use: No     Allergies   Gadolinium derivatives and Prilosec [omeprazole]   Review of Systems Review of Systems  Gastrointestinal: Positive for abdominal pain.  All other systems reviewed and are negative.    Physical Exam Updated Vital Signs BP (!) 150/110 (BP Location: Right Arm)   Pulse (!) 106   Temp 99 F (37.2 C) (Oral)   Resp (!) 22   Ht 5\' 10"  (1.778 m)   Wt 81.6 kg   SpO2 100%   BMI 25.83 kg/m   Physical Exam Vitals signs and nursing note reviewed.  Constitutional:      Appearance: He is  well-developed.     Comments: Patient is shaking uncontrollably, moaning appears uncomfortable.  HENT:     Head: Atraumatic.  Eyes:     Conjunctiva/sclera: Conjunctivae normal.  Neck:     Musculoskeletal: Neck supple.  Cardiovascular:     Rate and Rhythm: Tachycardia present.  Pulmonary:     Effort: Pulmonary effort is normal.     Breath sounds: Normal breath sounds.     Comments: Mild tachypnea Abdominal:     General: Abdomen is flat. Bowel sounds are normal.     Palpations: Abdomen is soft.     Tenderness: There is generalized abdominal tenderness (Diffuse abdominal tenderness without focal point tenderness.).     Hernia: No hernia is present.  Skin:    Findings: No rash.  Neurological:     Mental Status: He is alert.      ED Treatments / Results  Labs (all labs ordered are listed, but only abnormal results are displayed) Labs Reviewed  LIPASE, BLOOD - Abnormal; Notable for the following components:      Result Value   Lipase 72 (*)    All other components within normal limits  COMPREHENSIVE METABOLIC PANEL - Abnormal; Notable for the following components:   CO2 21 (*)    Glucose, Bld 145 (*)    Alkaline Phosphatase 34 (*)    Anion gap 18 (*)    All other components within normal limits  CBC - Abnormal; Notable for the following components:   WBC 3.1 (*)    All other components within normal limits  URINALYSIS, ROUTINE W REFLEX MICROSCOPIC - Abnormal; Notable for the following components:   Ketones, ur 5 (*)    Protein, ur 30 (*)    All other components within normal limits  SARS CORONAVIRUS 2 (TAT 6-24 HRS)    EKG None  ED ECG REPORT   Date: 10/03/2019  Rate: 100  Rhythm: sinus tachycardia  QRS Axis: normal  Intervals: borderline prolonged QT  ST/T Wave abnormalities: early repolarization  Conduction Disutrbances:none  Narrative Interpretation: LVH  Old EKG Reviewed: unchanged  I have personally reviewed the EKG tracing and agree with the  computerized printout as noted.   Radiology Ct Abdomen Pelvis W Contrast  Result Date: 10/03/2019 CLINICAL DATA:  Abdominal pain, rule out pancreatitis. EXAM: CT ABDOMEN AND PELVIS WITH CONTRAST TECHNIQUE: Multidetector CT imaging of the abdomen and pelvis was performed using the standard protocol following bolus administration of intravenous contrast. CONTRAST:  12mL OMNIPAQUE IOHEXOL 300 MG/ML  SOLN COMPARISON:  07/17/2019 FINDINGS: Lower chest: Basilar atelectasis and scarring. No signs of consolidation or pleural effusion. Hepatobiliary: Stable hemangioma in the left hepatic lobe.  Hepatic parenchyma with background low-attenuation. No suspicious, focal hepatic lesion. Sludge layering dependently in the gallbladder. The biliary tree is nondilated. No pericholecystic stranding. Pancreas: Unremarkable. No pancreatic ductal dilatation or surrounding inflammatory changes. Spleen: Normal in size without focal abnormality. Adrenals/Urinary Tract: Normal adrenal glands. Symmetric enhancement of bilateral kidneys. Stomach/Bowel: No signs of acute gastrointestinal process. Appendix is normal. No mesenteric stranding. Vascular/Lymphatic: No sign of acute vascular process. Abdominal vasculature is patent without significant atherosclerosis. There is no adenopathy the upper abdomen, retroperitoneum or in the pelvis. Reproductive: Nonspecific appearance of the prostate, likely post TURP. Other: No signs of free air. No focal fluid collection. Musculoskeletal: Spinal degenerative change without acute or destructive bone process. IMPRESSION: 1. No CT evidence for acute intra-abdominal pathology, specifically no current evidence of acute pancreatitis. 2. Probable hepatic steatosis. 3. Stable left hepatic lobe hemangioma. Electronically Signed   By: Zetta Bills M.D.   On: 10/03/2019 11:34    Procedures Procedures (including critical care time)  Medications Ordered in ED Medications  sodium chloride flush (NS)  0.9 % injection 3 mL (has no administration in time range)  sodium chloride (PF) 0.9 % injection (has no administration in time range)  LORazepam (ATIVAN) injection 1 mg (1 mg Intravenous Given 10/03/19 0911)  morphine 4 MG/ML injection 4 mg (4 mg Intravenous Given 10/03/19 0912)  sodium chloride 0.9 % bolus 1,000 mL (0 mLs Intravenous Stopped 10/03/19 0939)  iohexol (OMNIPAQUE) 300 MG/ML solution 100 mL (100 mLs Intravenous Contrast Given 10/03/19 1110)     Initial Impression / Assessment and Plan / ED Course  I have reviewed the triage vital signs and the nursing notes.  Pertinent labs & imaging results that were available during my care of the patient were reviewed by me and considered in my medical decision making (see chart for details).        BP (!) 165/104   Pulse 88   Temp 99 F (37.2 C) (Oral)   Resp 18   Ht 5\' 10"  (1.778 m)   Wt 81.6 kg   SpO2 94%   BMI 25.83 kg/m    Final Clinical Impressions(s) / ED Diagnoses   Final diagnoses:  Generalized abdominal pain    ED Discharge Orders    None     9:05 AM Patient complaint of abdominal pain and back pain, history of alcohol abuse, appears to be tremulous but report last alcohol use was yesterday therefore I have low suspicion for alcohol withdrawal causing his pain.  He will benefit from work-up to rule out pancreatitis or other acute abdominal pathology.  Will obtain abdominal pelvic CT scan for further evaluation.  Will provide symptomatic treatment.  11:44 AM Patient is no longer tremulous.  He is resting comfortably.  Urine without signs of urinary tract infection, mildly elevated lipase of 72, mildly depressed white count of 3.1, abdominal pelvis CT scan show no acute pathology.  Given his earlier tremors, and a low-grade fever of 99, will obtain COVID-19 testing.  Otherwise I believe patient is stable for discharge. Care discussed with DR. Delo.   Mato Klay was evaluated in Emergency Department on  10/03/2019 for the symptoms described in the history of present illness. He was evaluated in the context of the global COVID-19 pandemic, which necessitated consideration that the patient might be at risk for infection with the SARS-CoV-2 virus that causes COVID-19. Institutional protocols and algorithms that pertain to the evaluation of patients at risk for COVID-19 are in a state of rapid change  based on information released by regulatory bodies including the CDC and federal and state organizations. These policies and algorithms were followed during the patient's care in the ED.    Domenic Moras, PA-C 10/03/19 1326    Veryl Speak, MD 10/04/19 [phone]

## 2019-10-03 NOTE — ED Notes (Signed)
Pt ambulatory in lobby/triage

## 2019-10-03 NOTE — ED Notes (Signed)
ED Provider at bedside. 

## 2019-10-03 NOTE — ED Notes (Signed)
Pt screaming from room and being disruptive. Pt asked to please stop yelling.

## 2019-10-03 NOTE — ED Notes (Signed)
An After Visit Summary was printed and given to the patient. Discharge instructions given and no further questions at this time.  

## 2019-10-03 NOTE — ED Notes (Signed)
[facility] called  

## 2019-10-03 NOTE — Discharge Instructions (Signed)
Your abdominal and pelvis CT scan is without any concerning finding.  Please follow up with your doctor for further care.  Avoid alcohol use as it may contribute to your condition.  A Covid-19 test have been obtained.  If positive, you will be notify in the next several days. Return if you have any concerns.

## 2019-10-04 ENCOUNTER — Telehealth: Payer: Self-pay | Admitting: Nurse Practitioner

## 2019-10-04 NOTE — Telephone Encounter (Signed)
Called to Discuss with patient about Covid symptoms and the use of bamlanivimab, a monoclonal antibody infusion for those with mild to moderate Covid symptoms and at a high risk of hospitalization.     Pt is qualified for this infusion at the [facility] due to co-morbid conditions and/or a member of an at-risk group.     Unable to reach pt  

## 2019-10-04 NOTE — Telephone Encounter (Signed)
Called to Discuss with patient about Covid symptoms and the use of bamlanivimab, a monoclonal antibody infusion for those with mild to moderate Covid symptoms and at a high risk of hospitalization.     Pt is qualified for this infusion at the [facility] infusion center due to co-morbid conditions and/or a member of an at-risk group.     Patient is currently not having any symptoms.

## 2019-11-02 ENCOUNTER — Ambulatory Visit: Payer: Medicare Other | Admitting: Podiatry

## 2020-01-03 ENCOUNTER — Other Ambulatory Visit: Payer: Self-pay

## 2020-01-03 ENCOUNTER — Encounter: Payer: Self-pay | Admitting: Podiatry

## 2020-01-03 ENCOUNTER — Ambulatory Visit (INDEPENDENT_AMBULATORY_CARE_PROVIDER_SITE_OTHER): Payer: Medicare Other | Admitting: Podiatry

## 2020-01-03 DIAGNOSIS — M79676 Pain in unspecified toe(s): Secondary | ICD-10-CM

## 2020-01-03 DIAGNOSIS — B353 Tinea pedis: Secondary | ICD-10-CM | POA: Diagnosis not present

## 2020-01-03 DIAGNOSIS — B351 Tinea unguium: Secondary | ICD-10-CM

## 2020-01-03 MED ORDER — CLOTRIMAZOLE 1 % EX CREA: TOPICAL_CREAM | CUTANEOUS | 1 refills | Status: DC

## 2020-01-03 NOTE — Patient Instructions (Addendum)

## 2020-01-08 NOTE — Progress Notes (Signed)
Subjective: Kenneth Roberts presents today for follow up of painful mycotic nails b/l that are difficult to trim. Pain interferes with ambulation. Aggravating factors include wearing enclosed shoe gear. Pain is relieved with periodic professional debridement.   Allergies  Allergen Reactions  . Gadolinium Derivatives Other (See Comments)    unknown  . Prilosec [Omeprazole] Itching     Objective: There were no vitals filed for this visit.  Pt 79 y.o. year old male  in NAD. AAO x 3.   Vascular Examination:  Capillary refill time to digits immediate b/l. Palpable DP pulses b/l. Palpable PT pulses b/l. Pedal hair present b/l. Skin temperature gradient within normal limits b/l.  Dermatological Examination: Pedal skin with normal turgor, texture and tone bilaterally. No open wounds bilaterally. No interdigital macerations bilaterally. Toenails 1-5 b/l elongated, dystrophic, thickened, crumbly with subungual debris and tenderness to dorsal palpation.   Diffuse scaling noted peripherally and plantarly b/l feet with mild foot odor.  No interdigital macerations.  No blisters, no weeping. No signs of secondary bacterial infection noted.  Musculoskeletal: Normal muscle strength 5/5 to all lower extremity muscle groups bilaterally, no pain crepitus or joint limitation noted with ROM b/l, bunion deformity noted b/l and hammertoes noted to the  2-5 bilaterally  Neurological: Protective sensation intact 5/5 intact bilaterally with 10g monofilament b/l Vibratory sensation intact b/l Proprioception intact bilaterally  Assessment: 1. Pain due to onychomycosis of toenail   2. Tinea pedis of both feet    Plan: -Toenails 1-5 b/l were debrided in length and girth with sterile nail nippers and dremel without iatrogenic bleeding.  -Discussed tinea pedis and treatment options. Rx for Clotrimazole Cream 1% to be applied to both feet and between toes bid x 4 weeks. Discussed principles to avoid  re-infection. -Patient to continue soft, supportive shoe gear daily. -Patient to report any pedal injuries to medical professional immediately. -Patient/POA to call should there be question/concern in the interim.  Return in about 10 weeks (around 03/13/2020) for nail trim.

## 2020-01-09 ENCOUNTER — Ambulatory Visit: Payer: Medicare Other

## 2020-01-30 ENCOUNTER — Other Ambulatory Visit: Payer: Self-pay

## 2020-01-30 ENCOUNTER — Ambulatory Visit (INDEPENDENT_AMBULATORY_CARE_PROVIDER_SITE_OTHER): Payer: Medicare Other | Admitting: Podiatry

## 2020-01-30 ENCOUNTER — Encounter: Payer: Self-pay | Admitting: Podiatry

## 2020-01-30 ENCOUNTER — Ambulatory Visit (INDEPENDENT_AMBULATORY_CARE_PROVIDER_SITE_OTHER): Payer: Medicare Other

## 2020-01-30 ENCOUNTER — Other Ambulatory Visit: Payer: Self-pay | Admitting: Podiatry

## 2020-01-30 VITALS — Temp 97.6°F

## 2020-01-30 DIAGNOSIS — M79671 Pain in right foot: Secondary | ICD-10-CM

## 2020-01-30 DIAGNOSIS — M779 Enthesopathy, unspecified: Secondary | ICD-10-CM

## 2020-01-30 DIAGNOSIS — M1000 Idiopathic gout, unspecified site: Secondary | ICD-10-CM

## 2020-01-30 NOTE — Progress Notes (Signed)
Subjective:   Patient ID: Kenneth Roberts, male   DOB: 79 y.o.   MRN: [id number]   HPI Patient states he is developed acute pain of his right first metatarsal joint and he does not remember injury and does get his nails taken care of   ROS      Objective:  Physical Exam  Neurovascular status found to be intact muscle strength found to be adequate with patient found to have exquisite discomfort around the first MPJ right fluid buildup with mild redness noted around the joint surface but no acute redness noted currently     Assessment:  Inflammatory capsulitis first MPJ right with acute nature problem given the possibility for A strong possibility     Plan:  H&P reviewed condition sterile prep done and injected around the first MPJ 3 mg Kenalog 5 mg Xylocaine advised on support shoes and gave him instructions on gout and educated on gout foods to be careful and if symptoms were to worsen will have to get blood work and may need to consider medication for gout if we see this becomes chronic.  If he has any abnormal response to this or does not improve he is to let us know immediately  X-rays were negative for signs of fracture or indications of hallux limitus rigidus deformity

## 2020-01-30 NOTE — Patient Instructions (Signed)
 Gout  Gout is painful swelling of your joints. Gout is a type of arthritis. It is caused by having too much uric acid in your body. Uric acid is a chemical that is made when your body breaks down substances called purines. If your body has too much uric acid, sharp crystals can form and build up in your joints. This causes pain and swelling. Gout attacks can happen quickly and be very painful (acute gout). Over time, the attacks can affect more joints and happen more often (chronic gout). What are the causes?  Too much uric acid in your blood. This can happen because: ? Your kidneys do not remove enough uric acid from your blood. ? Your body makes too much uric acid. ? You eat too many foods that are high in purines. These foods include organ meats, some seafood, and beer.  Trauma or stress. What increases the risk?  Having a family history of gout.  Being male and middle-aged.  Being male and having gone through menopause.  Being very overweight (obese).  Drinking alcohol, especially beer.  Not having enough water in the body (being dehydrated).  Losing weight too quickly.  Having an organ transplant.  Having lead poisoning.  Taking certain medicines.  Having kidney disease.  Having a skin condition called psoriasis. What are the signs or symptoms? An attack of acute gout usually happens in just one joint. The most common place is the big toe. Attacks often start at night. Other joints that may be affected include joints of the feet, ankle, knee, fingers, wrist, or elbow. Symptoms of an attack may include:  Very bad pain.  Warmth.  Swelling.  Stiffness.  Shiny, red, or purple skin.  Tenderness. The affected joint may be very painful to touch.  Chills and fever. Chronic gout may cause symptoms more often. More joints may be involved. You may also have white or yellow lumps (tophi) on your hands or feet or in other areas near your joints. How is this  treated?  Treatment for this condition has two phases: treating an acute attack and preventing future attacks.  Acute gout treatment may include: ? NSAIDs. ? Steroids. These are taken by mouth or injected into a joint. ? Colchicine. This medicine relieves pain and swelling. It can be given by mouth or through an IV tube.  Preventive treatment may include: ? Taking small doses of NSAIDs or colchicine daily. ? Using a medicine that reduces uric acid levels in your blood. ? Making changes to your diet. You may need to see a food expert (dietitian) about what to eat and drink to prevent gout. Follow these instructions at home: During a gout attack   If told, put ice on the painful area: ? Put ice in a plastic bag. ? Place a towel between your skin and the bag. ? Leave the ice on for 20 minutes, 2-3 times a day.  Raise (elevate) the painful joint above the level of your heart as often as you can.  Rest the joint as much as possible. If the joint is in your leg, you may be given crutches.  Follow instructions from your doctor about what you cannot eat or drink. Avoiding future gout attacks  Eat a low-purine diet. Avoid foods and drinks such as: ? Liver. ? Kidney. ? Anchovies. ? Asparagus. ? Herring. ? Mushrooms. ? Mussels. ? Beer.  Stay at a healthy weight. If you want to lose weight, talk with your doctor. Do not lose   weight too fast.  Start or continue an exercise plan as told by your doctor. Eating and drinking  Drink enough fluids to keep your pee (urine) pale yellow.  If you drink alcohol: ? Limit how much you use to:  0-1 drink a day for women.  0-2 drinks a day for men. ? Be aware of how much alcohol is in your drink. In the [place] one drink equals one 12 oz bottle of beer (355 mL), one 5 oz glass of wine (148 mL), or one 1 oz glass of hard liquor (44 mL). General instructions  Take over-the-counter and prescription medicines only as told by your doctor.  Do  not drive or use heavy machinery while taking prescription pain medicine.  Return to your normal activities as told by your doctor. Ask your doctor what activities are safe for you.  Keep all follow-up visits as told by your doctor. This is important. Contact a doctor if:  You have another gout attack.  You still have symptoms of a gout attack after 10 days of treatment.  You have problems (side effects) because of your medicines.  You have chills or a fever.  You have burning pain when you pee (urinate).  You have pain in your lower back or belly. Get help right away if:  You have very bad pain.  Your pain cannot be controlled.  You cannot pee. Summary  Gout is painful swelling of the joints.  The most common site of pain is the big toe, but it can affect other joints.  Medicines and avoiding some foods can help to prevent and treat gout attacks. This information is not intended to replace advice given to you by your health care provider. Make sure you discuss any questions you have with your health care provider. Document Revised: 05/12/2018 Document Reviewed: 05/12/2018 Elsevier Patient Education  2020 [employer]  

## 2020-02-13 ENCOUNTER — Telehealth: Payer: Self-pay

## 2020-02-13 NOTE — Telephone Encounter (Signed)
Crystal spoke with patient and scheduled an appointment with Dr. Paulla Dolly for his gout

## 2020-02-13 NOTE — Telephone Encounter (Signed)
Patient left a message that his gout was not getting better and the pain is getting worse. He also said his toe was turning "black". I called back but got his voicemail. I left a message strongly advising to call back to make an appointment.

## 2020-02-17 ENCOUNTER — Other Ambulatory Visit: Payer: Self-pay

## 2020-02-17 ENCOUNTER — Ambulatory Visit (INDEPENDENT_AMBULATORY_CARE_PROVIDER_SITE_OTHER): Payer: Medicare Other | Admitting: Podiatry

## 2020-02-17 DIAGNOSIS — M19071 Primary osteoarthritis, right ankle and foot: Secondary | ICD-10-CM

## 2020-02-17 DIAGNOSIS — M79671 Pain in right foot: Secondary | ICD-10-CM | POA: Diagnosis not present

## 2020-02-17 DIAGNOSIS — M1000 Idiopathic gout, unspecified site: Secondary | ICD-10-CM | POA: Diagnosis not present

## 2020-02-17 MED ORDER — COLCHICINE 0.6 MG PO TABS: 0.6000 mg | ORAL_TABLET | Freq: Every day | ORAL | 1 refills | Status: DC

## 2020-02-21 ENCOUNTER — Encounter: Payer: Self-pay | Admitting: Podiatry

## 2020-02-21 ENCOUNTER — Telehealth: Payer: Self-pay | Admitting: *Deleted

## 2020-02-21 DIAGNOSIS — M79671 Pain in right foot: Secondary | ICD-10-CM

## 2020-02-21 DIAGNOSIS — M19071 Primary osteoarthritis, right ankle and foot: Secondary | ICD-10-CM

## 2020-02-21 DIAGNOSIS — M779 Enthesopathy, unspecified: Secondary | ICD-10-CM

## 2020-02-21 DIAGNOSIS — M1000 Idiopathic gout, unspecified site: Secondary | ICD-10-CM

## 2020-02-21 NOTE — Telephone Encounter (Signed)
-----   Message from Felipa Furnace, DPM sent at 02/21/2020  8:29 AM EDT ----- Regarding: Rheumatology follow-up Hi Prudence Heiny,  [facility] referral for rheumatology follow-up for this patient for gout medication/long-term treatment options.  Thanks Lennette Bihari

## 2020-02-21 NOTE — Progress Notes (Signed)
Subjective:  Patient ID: Kenneth Roberts, male    DOB: 19-Jul-1941,  MRN: [id number]  Chief Complaint  Patient presents with  . Foot Pain    pt is here for some right foot pain, pt states that it is painful to the touch. Pt also states that the right foot gout is flaring up as well. pt states that the foot pain.     79 y.o. male presents with the above complaint.  Patient presents with pain to the right first metatarsophalangeal joint.  Patient states that this is likely due to gout.  He has another gout flareup.  He has not seen a rheumatologist.  Patient is also concerned that there is some swelling to the right side.  The pain scale is 10 out of 10.  He would like to know if there is something that could be done that could give her the pain.  He does not see a rheumatologist.  He denies any other acute complaints.  He does have alcohol intake on a daily basis as well as diet including red meat.   Review of Systems: Negative except as noted in the HPI. Denies N/V/F/Ch.  Past Medical History:  Diagnosis Date  . Adenomatous colon polyp 01/2009  . Anemia   . BPH (benign prostatic hypertrophy)   . Cataract   . Chronic headaches   . Colon polyps   . Fatty liver   . GERD (gastroesophageal reflux disease)   . Heartburn   . Hepatic hemangioma   . Hiatal hernia   . Hypertension   . Internal hemorrhoids   . Leukopenia     Current Outpatient Medications:  .  amLODipine (NORVASC) 5 MG tablet, Take 5 mg by mouth daily., Disp: , Rfl: 3 .  augmented betamethasone dipropionate (DIPROLENE-AF) 0.05 % ointment, , Disp: , Rfl:  .  clotrimazole (LOTRIMIN) 1 % cream, Apply to both feet and between toes twice daily for 4 weeks., Disp: 45 g, Rfl: 1 .  dorzolamide-timolol (COSOPT) 22.3-6.8 MG/ML ophthalmic solution, , Disp: , Rfl:  .  gabapentin (NEURONTIN) 100 MG capsule, Take 100 mg by mouth at bedtime., Disp: , Rfl:  .  gabapentin (NEURONTIN) 300 MG capsule, Take 300 mg by mouth daily., Disp: , Rfl:   .  latanoprost (XALATAN) 0.005 % ophthalmic solution, Place 1 drop into both eyes at bedtime., Disp: , Rfl:  .  losartan (COZAAR) 25 MG tablet, Take 25 mg by mouth daily., Disp: , Rfl:  .  omeprazole (PRILOSEC) 20 MG capsule, Take 20 mg by mouth every morning., Disp: , Rfl:  .  tamsulosin (FLOMAX) 0.4 MG CAPS capsule, Take 0.4 mg by mouth daily., Disp: , Rfl:  .  terbinafine (LAMISIL) 250 MG tablet, Take 250 mg by mouth daily., Disp: , Rfl:  .  triamcinolone cream (KENALOG) 0.5 %, , Disp: , Rfl:  .  colchicine 0.6 MG tablet, Take 1 tablet (0.6 mg total) by mouth daily., Disp: 30 tablet, Rfl: 1  Social History   Tobacco Use  Smoking Status Former Smoker  . Quit date: 02/16/1981  . Years since quitting: 39.0  Smokeless Tobacco Never Used    Allergies  Allergen Reactions  . Gadolinium Derivatives Other (See Comments)    unknown  . Prilosec [Omeprazole] Itching   Objective:  There were no vitals filed for this visit. There is no height or weight on file to calculate BMI. Constitutional Well developed. Well nourished.  Vascular Dorsalis pedis pulses palpable bilaterally. Posterior tibial pulses palpable bilaterally.  Capillary refill normal to all digits.  No cyanosis or clubbing noted. Pedal hair growth normal.  Neurologic Normal speech. Oriented to person, place, and time. Epicritic sensation to light touch grossly present bilaterally.  Dermatologic Nails well groomed and normal in appearance. No open wounds. No skin lesions.  Orthopedic:  Pain on palpation of the right metatarsophalangeal joint.  Pain with range of motion.  Mild intra-articular pain noted.  Active and passive range of motion painful.  No flexor or extensor tendinitis.  No pain at the sesamoidal complex.   Radiographs: None Assessment:   1. Arthritis of first metatarsophalangeal (MTP) joint of right foot   2. Pain in right foot   3. Idiopathic gout, unspecified chronicity, unspecified site    Plan:    Patient was evaluated and treated and all questions answered.  Right first metatarsophalangeal joint arthritis/gout flare -I explained to the patient the etiology of gout flare and various treatment options were extensively discussed.  Patient states that he does have a history of gout and the pain appears to be similar to this.  Patient would like to know if there is any treatment options for this.  I believe he will benefit from a steroid injection to help decrease the local inflammatory component of gout. -A steroid injection was performed at right first metatarsophalangeal joint using 1% plain Lidocaine and 10 mg of Kenalog. This was well tolerated. -I believe he will benefit from a rheumatology follow-up for long-term management of gout. -I discussed with the patient decreasing consumption of alcohol as well as diet restriction.  Patient states understanding -Colchicine was dispensed  No follow-ups on file.

## 2020-02-21 NOTE — Telephone Encounter (Signed)
Faxed referral, clinicals and demographics to [facility].

## 2020-03-22 ENCOUNTER — Encounter (HOSPITAL_COMMUNITY): Payer: Self-pay

## 2020-03-22 ENCOUNTER — Other Ambulatory Visit: Payer: Self-pay

## 2020-03-22 ENCOUNTER — Ambulatory Visit (HOSPITAL_COMMUNITY)
Admission: EM | Admit: 2020-03-22 | Discharge: 2020-03-22 | Disposition: A | Discharge status: Home / Self Care | Payer: Medicare Other | Attending: Emergency Medicine | Admitting: Emergency Medicine

## 2020-03-22 DIAGNOSIS — L299 Pruritus, unspecified: Secondary | ICD-10-CM | POA: Diagnosis not present

## 2020-03-22 MED ORDER — METHYLPREDNISOLONE 4 MG PO TBPK: ORAL_TABLET | Freq: Every day | ORAL | 0 refills | Status: DC

## 2020-03-22 NOTE — ED Provider Notes (Signed)
HPI  SUBJECTIVE:  Kenneth Roberts is a 79 y.o. male who presents with burning pins-and-needles pain, itching, over his "whole body".  This is present with sweating and heat.  Is better with cool air.  He has tried an unknown salve for this.  He is experiencing  this pain only in hyperpigmented areas where he states the shingles were.  He states that these symptoms are left over from shingles that he had 8 months ago.  States that the shingles "never left".  He states that he had shingles over his entire body except his face.  He was seen at [facility] twice for this.  He denies any vesicular rash currently.   Patient was seen in May 2019 with zoster in the left T4-T5 distribution.  Presented again to the ED in January 2020 describing a diffuse burning rash.  Was given topical and oral steroids.  Patient states that he has seen his doctor for this and was given an unknown salve which he states is not helping.  Past medical history of hypertension, shingles, Covid in November 2020.  No history of diabetes, chronic kidney disease, to be immunocompromise, eczema, asthma, allergies.  PMD: On [employer].  Past Medical History:  Diagnosis Date  . Adenomatous colon polyp 01/2009  . Anemia   . BPH (benign prostatic hypertrophy)   . Cataract   . Chronic headaches   . Colon polyps   . Fatty liver   . GERD (gastroesophageal reflux disease)   . Heartburn   . Hepatic hemangioma   . Hiatal hernia   . Hypertension   . Internal hemorrhoids   . Leukopenia     Past Surgical History:  Procedure Laterality Date  . CATARACT EXTRACTION     Left eye  . COLONOSCOPY      Family History  Problem Relation Age of Onset  . Stomach cancer Mother   . Diabetes Father   . Diabetes Brother   . Diabetes Sister   . Colon cancer Neg Hx   . Colon polyps Neg Hx   . Esophageal cancer Neg Hx     Social History   Tobacco Use  . Smoking status: Former Smoker    Quit date: 02/16/1981    Years since quitting: 39.1   . Smokeless tobacco: Never Used  Substance Use Topics  . Alcohol use: Yes    Alcohol/week: 2.0 - 4.0 standard drinks    Types: 2 - 4 Glasses of wine per week    Comment: 4-5 drinks week  . Drug use: No    No current facility-administered medications for this encounter.  Current Outpatient Medications:  .  amLODipine (NORVASC) 5 MG tablet, Take 5 mg by mouth daily., Disp: , Rfl: 3 .  augmented betamethasone dipropionate (DIPROLENE-AF) 0.05 % ointment, , Disp: , Rfl:  .  clotrimazole (LOTRIMIN) 1 % cream, Apply to both feet and between toes twice daily for 4 weeks., Disp: 45 g, Rfl: 1 .  colchicine 0.6 MG tablet, Take 1 tablet (0.6 mg total) by mouth daily., Disp: 30 tablet, Rfl: 1 .  dorzolamide-timolol (COSOPT) 22.3-6.8 MG/ML ophthalmic solution, , Disp: , Rfl:  .  finasteride (PROSCAR) 5 MG tablet, Take 5 mg by mouth daily., Disp: , Rfl:  .  gabapentin (NEURONTIN) 100 MG capsule, Take 100 mg by mouth at bedtime., Disp: , Rfl:  .  gabapentin (NEURONTIN) 300 MG capsule, Take 300 mg by mouth daily., Disp: , Rfl:  .  latanoprost (XALATAN) 0.005 % ophthalmic solution,  Place 1 drop into both eyes at bedtime., Disp: , Rfl:  .  losartan (COZAAR) 25 MG tablet, Take 25 mg by mouth daily., Disp: , Rfl:  .  methylPREDNISolone (MEDROL DOSEPAK) 4 MG TBPK tablet, Take by mouth daily. Follow package instructions, Disp: 21 tablet, Rfl: 0 .  omeprazole (PRILOSEC) 20 MG capsule, Take 20 mg by mouth every morning., Disp: , Rfl:  .  pantoprazole (PROTONIX) 40 MG tablet, Take 40 mg by mouth daily., Disp: , Rfl:  .  tamsulosin (FLOMAX) 0.4 MG CAPS capsule, Take 0.4 mg by mouth daily., Disp: , Rfl:  .  terbinafine (LAMISIL) 250 MG tablet, Take 250 mg by mouth daily., Disp: , Rfl:  .  triamcinolone cream (KENALOG) 0.5 %, , Disp: , Rfl:   Allergies  Allergen Reactions  . Gadolinium Derivatives Other (See Comments)    unknown  . Prilosec [Omeprazole] Itching     ROS  As noted in HPI.   Physical  Exam  BP 130/86 (BP Location: Right Arm)   Pulse 94   Temp 98.8 F (37.1 C) (Oral)   Resp 18   Wt 81.6 kg   SpO2 100%   BMI 25.83 kg/m   Constitutional: Well developed, well nourished, no acute distress Eyes:  EOMI, conjunctiva normal bilaterally HENT: Normocephalic, atraumatic,mucus membranes moist Respiratory: Normal inspiratory effort Cardiovascular: Normal rate GI: nondistended skin: Nontender areas of hyperpigmentation over lower extremities, arms without excoriations.  This is where patient states he itches.  No lesions seen on the trunk.  No vesicles anywhere over entire body. Musculoskeletal: no deformities Neurologic: Alert & oriented x 3, no focal neuro deficits Psychiatric: Speech and behavior appropriate   ED Course   Medications - No data to display  No orders of the defined types were placed in this encounter.   No results found for this or any previous visit (from the past 24 hour(s)). No results found.  ED Clinical Impression  1. Pruritus      ED Assessment/Plan  Previous ER records reviewed.  Additional medical history obtained.  As noted in HPI  Patient with multiple areas of hyperpigmentation which could be left over from previous bouts of shingles although documentation states it was solely on his left chest.  There does not appear to be any active shingles.  No evidence of infection.  will try a Medrol Dosepak, Claritin or Zyrtec.  Follow-up with his primary care physician on [facility] with this does not help for next steps and possible referral to dermatology.   Normal kidney function as of November 2020  Discussed labs, imaging, MDM, treatment plan, and plan for follow-up with patient. patient agrees with plan.   Meds ordered this encounter  Medications  . methylPREDNISolone (MEDROL DOSEPAK) 4 MG TBPK tablet    Sig: Take by mouth daily. Follow package instructions    Dispense:  21 tablet    Refill:  0    *This clinic note was  created using Dragon dictation software. Therefore, there may be occasional mistakes despite careful proofreading.   ?    Melynda Ripple, MD 03/23/20 [phone]

## 2020-03-22 NOTE — ED Triage Notes (Signed)
Pt is here with Shingles that recently has been bothering him this started a week ago. Pt has not taken anything to relieve discomfort.

## 2020-03-22 NOTE — Discharge Instructions (Addendum)
Try the Medrol dose pack.  Take Claritin or Zyrtec.  This should help with your itching and burning.  Follow-up with your doctor if this does not help.  You may need up seeing a dermatologist.  Go to www.goodrx.com to look up your medications. This will give you a list of where you can find your prescriptions at the most affordable prices. Or ask the pharmacist what the cash price is, or if they have any other discount programs available to help make your medication more affordable. This can be less expensive than what you would pay with insurance.

## 2020-03-30 ENCOUNTER — Ambulatory Visit (INDEPENDENT_AMBULATORY_CARE_PROVIDER_SITE_OTHER): Payer: Medicare Other | Admitting: Podiatry

## 2020-03-30 ENCOUNTER — Encounter: Payer: Self-pay | Admitting: Podiatry

## 2020-03-30 ENCOUNTER — Other Ambulatory Visit: Payer: Self-pay

## 2020-03-30 DIAGNOSIS — B351 Tinea unguium: Secondary | ICD-10-CM

## 2020-03-30 DIAGNOSIS — M79671 Pain in right foot: Secondary | ICD-10-CM | POA: Diagnosis not present

## 2020-03-30 DIAGNOSIS — B353 Tinea pedis: Secondary | ICD-10-CM

## 2020-03-30 DIAGNOSIS — L84 Corns and callosities: Secondary | ICD-10-CM | POA: Diagnosis not present

## 2020-03-30 DIAGNOSIS — M79676 Pain in unspecified toe(s): Secondary | ICD-10-CM

## 2020-03-30 DIAGNOSIS — M79672 Pain in left foot: Secondary | ICD-10-CM

## 2020-03-30 MED ORDER — CLOTRIMAZOLE-BETAMETHASONE 1-0.05 % EX CREA: TOPICAL_CREAM | CUTANEOUS | 1 refills | Status: DC

## 2020-03-30 NOTE — Progress Notes (Signed)
Subjective: Kenneth Roberts is a 79 y.o. male patient seen today follow up tinea pedis b/l feet and painful mycotic nails b/l that are difficult to trim. Pain interferes with ambulation. Aggravating factors include wearing enclosed shoe gear. Pain is relieved with periodic professional debridement.  Patient states he doesn't think Clotrimazole Cream alone is working.  Patient Active Problem List   Diagnosis Date Noted  . Hiatal hernia 12/25/2016  . Routine general medical examination at a health care facility 12/22/2015  . Precordial chest pain 12/20/2015  . 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 Lumbago 01/30/2014  . HLD (hyperlipidemia) 07/21/2013  . Hemorrhoids 04/14/2012  . BPH (benign prostatic hyperplasia) 04/14/2012  . PERSONAL HX COLONIC POLYPS 01/20/2011  . Essential hypertension 04/27/2007  . GERD 04/27/2007    Current Outpatient Medications on File Prior to Visit  Medication Sig Dispense Refill  . amLODipine (NORVASC) 5 MG tablet Take 5 mg by mouth daily.  3  . augmented betamethasone dipropionate (DIPROLENE-AF) 0.05 % ointment     . colchicine 0.6 MG tablet Take 1 tablet (0.6 mg total) by mouth daily. 30 tablet 1  . dorzolamide-timolol (COSOPT) 22.3-6.8 MG/ML ophthalmic solution     . finasteride (PROSCAR) 5 MG tablet Take 5 mg by mouth daily.    Marland Kitchen gabapentin (NEURONTIN) 100 MG capsule Take 100 mg by mouth at bedtime.    . gabapentin (NEURONTIN) 300 MG capsule Take 300 mg by mouth daily.    Marland Kitchen latanoprost (XALATAN) 0.005 % ophthalmic solution Place 1 drop into both eyes at bedtime.    Marland Kitchen losartan (COZAAR) 25 MG tablet Take 25 mg by mouth daily.    . methylPREDNISolone (MEDROL DOSEPAK) 4 MG TBPK tablet Take by mouth daily. Follow package instructions 21 tablet 0  . omeprazole (PRILOSEC) 20 MG capsule Take 20 mg by mouth every morning.    . pantoprazole (PROTONIX) 40 MG tablet Take 40 mg by mouth daily.    . tamsulosin (FLOMAX) 0.4 MG CAPS capsule Take 0.4 mg by mouth  daily.    Marland Kitchen terbinafine (LAMISIL) 250 MG tablet Take 250 mg by mouth daily.    Marland Kitchen triamcinolone cream (KENALOG) 0.5 %      No current facility-administered medications on file prior to visit.    Allergies  Allergen Reactions  . Gadolinium Derivatives Other (See Comments)    unknown  . Prilosec [Omeprazole] Itching    Objective: Physical Exam  General: Kenneth Roberts is a pleasant 79 y.o. African American male, in NAD. AAO x 3.   Vascular:  Capillary refill time to digits immediate b/l. Palpable DP pulses b/l. Palpable PT pulses b/l. Pedal hair present b/l. Skin temperature gradient within normal limits b/l. No edema noted b/l. No pain with calf compression b/l.  Dermatological:  Pedal skin with normal turgor, texture and tone bilaterally. No open wounds bilaterally. No interdigital macerations bilaterally. Toenails 1-5 b/l elongated, dystrophic, thickened, crumbly with subungual debris and tenderness to dorsal palpation. No improvement in tinea pedis. Scaling remains on plantar and peripheral aspect of both feet.. Hyperkeratotic lesion(s) submet head 5 left foot and submet head 5 right foot.  No erythema, no edema, no drainage, no flocculence.  Musculoskeletal:  Normal muscle strength 5/5 to all lower extremity muscle groups bilaterally. No pain crepitus or joint limitation noted with ROM b/l. Hallux valgus with bunion deformity noted b/l. Hammertoes noted to the 2-5 bilaterally.  Neurological:  Protective sensation intact 5/5 intact bilaterally with 10g monofilament b/l. Vibratory sensation intact b/l.  Assessment and  Plan:  1. Pain due to onychomycosis of toenail   2. Callus   3. Pain in both feet   4. Tinea pedis of both feet    -Examined patient. -Toenails 1-5 b/l were debrided in length and girth with sterile nail nippers and dremel without iatrogenic bleeding.  -Callus(es) submet head 5 left foot and submet head 5 right foot pared utilizing sterile scalpel blade without  complication or incident. Total number debrided =2. -Patient to continue soft, supportive shoe gear daily. -Patient to report any pedal injuries to medical professional immediately. -Discontinue Clotrimazole Cream. Start Lotrisone Cream 1%/0.05% to feet and between toes bid for 4 weeks. Patient advised to spray shoes with disinfectant spray and clean bathtub/shower with bleach based cleanser. -Patient/POA to call should there be question/concern in the interim.  Return in about 10 weeks (around 06/08/2020) for nail trim.  Marzetta Board, DPM

## 2020-03-30 NOTE — Patient Instructions (Signed)

## 2020-06-13 ENCOUNTER — Other Ambulatory Visit: Payer: Self-pay

## 2020-06-13 ENCOUNTER — Ambulatory Visit (INDEPENDENT_AMBULATORY_CARE_PROVIDER_SITE_OTHER): Payer: Medicare Other | Admitting: Podiatry

## 2020-06-13 ENCOUNTER — Encounter: Payer: Self-pay | Admitting: Podiatry

## 2020-06-13 DIAGNOSIS — M79676 Pain in unspecified toe(s): Secondary | ICD-10-CM | POA: Diagnosis not present

## 2020-06-13 DIAGNOSIS — M216X9 Other acquired deformities of unspecified foot: Secondary | ICD-10-CM

## 2020-06-13 DIAGNOSIS — B351 Tinea unguium: Secondary | ICD-10-CM

## 2020-06-13 DIAGNOSIS — Q828 Other specified congenital malformations of skin: Secondary | ICD-10-CM

## 2020-06-13 NOTE — Progress Notes (Signed)
This patient returns to the office for evaluation and treatment of long thick painful nails .  This patient is unable to trim his own nails since the patient cannot reach his feet.  Patient says the nails are painful walking and wearing his shoes.  He has painful callus on the outside bottom of his left foot.  He returns for preventive foot care services.  General Appearance  Alert, conversant and in no acute stress.  Vascular  Dorsalis pedis and posterior tibial  pulses are palpable  bilaterally.  Capillary return is within normal limits  bilaterally. Temperature is within normal limits  bilaterally.  Neurologic  Senn-Weinstein monofilament wire test within normal limits  bilaterally. Muscle power within normal limits bilaterally.  Nails Thick disfigured discolored nails with subungual debris  from hallux to fifth toes bilaterally. No evidence of bacterial infection or drainage bilaterally.  Orthopedic  No limitations of motion  feet .  No crepitus or effusions noted.  No bony pathology or digital deformities noted.  Skin  normotropic skin with no porokeratosis noted bilaterally.  No signs of infections or ulcers noted.  Resolved tinea pedis   Onychomycosis  Pain in toes right foot  Pain in toes left foot  Debridement  of nails  1-5  B/L with a nail nipper.  Nails were then filed using a dremel tool with no incidents. Debride porokeratosis with # 15 blade.     RTC  10 weeks   Shajuana Mclucas DPM  

## 2020-09-14 ENCOUNTER — Encounter: Payer: Self-pay | Admitting: Podiatry

## 2020-09-14 ENCOUNTER — Ambulatory Visit (INDEPENDENT_AMBULATORY_CARE_PROVIDER_SITE_OTHER): Payer: Medicare Other | Admitting: Podiatry

## 2020-09-14 ENCOUNTER — Other Ambulatory Visit: Payer: Self-pay

## 2020-09-14 DIAGNOSIS — B351 Tinea unguium: Secondary | ICD-10-CM | POA: Diagnosis not present

## 2020-09-14 DIAGNOSIS — Q828 Other specified congenital malformations of skin: Secondary | ICD-10-CM | POA: Diagnosis not present

## 2020-09-14 DIAGNOSIS — M79676 Pain in unspecified toe(s): Secondary | ICD-10-CM | POA: Diagnosis not present

## 2020-09-14 NOTE — Progress Notes (Signed)
This patient returns to the office for evaluation and treatment of long thick painful nails .  This patient is unable to trim his own nails since the patient cannot reach his feet.  Patient says the nails are painful walking and wearing his shoes.  He has painful callus on the outside bottom of his left foot.  He returns for preventive foot care services.  General Appearance  Alert, conversant and in no acute stress.  Vascular  Dorsalis pedis and posterior tibial  pulses are palpable  bilaterally.  Capillary return is within normal limits  bilaterally. Temperature is within normal limits  bilaterally.  Neurologic  Senn-Weinstein monofilament wire test within normal limits  bilaterally. Muscle power within normal limits bilaterally.  Nails Thick disfigured discolored nails with subungual debris  from hallux to fifth toes bilaterally. No evidence of bacterial infection or drainage bilaterally.  Orthopedic  No limitations of motion  feet .  No crepitus or effusions noted.  No bony pathology or digital deformities noted.  Skin  normotropic skin with no porokeratosis noted bilaterally.  No signs of infections or ulcers noted.  Resolved tinea pedis   Onychomycosis  Pain in toes right foot  Pain in toes left foot  Debridement  of nails  1-5  B/L with a nail nipper.  Nails were then filed using a dremel tool with no incidents. Debride porokeratosis with # 15 blade.     RTC  10 weeks   Gardiner Barefoot DPM

## 2020-09-28 ENCOUNTER — Other Ambulatory Visit: Payer: Self-pay

## 2020-09-28 ENCOUNTER — Emergency Department (HOSPITAL_COMMUNITY)
Admission: EM | Admit: 2020-09-28 | Discharge: 2020-09-28 | Disposition: A | Discharge status: Home / Self Care | Payer: Medicare Other | Attending: Emergency Medicine | Admitting: Emergency Medicine

## 2020-09-28 DIAGNOSIS — R21 Rash and other nonspecific skin eruption: Secondary | ICD-10-CM | POA: Insufficient documentation

## 2020-09-28 DIAGNOSIS — I10 Essential (primary) hypertension: Secondary | ICD-10-CM | POA: Insufficient documentation

## 2020-09-28 DIAGNOSIS — Z87891 Personal history of nicotine dependence: Secondary | ICD-10-CM | POA: Insufficient documentation

## 2020-09-28 DIAGNOSIS — Z79899 Other long term (current) drug therapy: Secondary | ICD-10-CM | POA: Insufficient documentation

## 2020-09-28 MED ORDER — PREDNISONE 20 MG PO TABS
60.0000 mg | ORAL_TABLET | Freq: Once | ORAL | Status: AC
  Administered 2020-09-28: 60 mg via ORAL
  Filled 2020-09-28: qty 3

## 2020-09-28 MED ORDER — PREDNISONE 20 MG PO TABS: ORAL_TABLET | ORAL | 0 refills | Status: DC

## 2020-09-28 NOTE — Discharge Instructions (Signed)
Try to change your soap laundry detergent and lotions to something that is dye and perfume free.  Please follow-up with your family doctor in the office.  As this is been a persistent problem for you they may want to refer you to a dermatologist.

## 2020-09-28 NOTE — ED Provider Notes (Signed)
[facility] EMERGENCY DEPARTMENT Provider Note   CSN: [id number] Arrival date & time: 09/28/20  7124     History No chief complaint on file.   Kenneth Roberts is a 79 y.o. male.  79 yo M with a chief complaints of a diffuse rash, worse to the trunk going on for the past few days.  Reminds him when he had shingles in the past.  Has been applying a salve without improvement.  He denies fever denies cough or congestion.  Denies any new soaps or detergents.  The history is provided by the patient.  Illness Severity:  Moderate Onset quality:  Gradual Duration:  4 days Timing:  Constant Progression:  Worsening Chronicity:  New Associated symptoms: rash   Associated symptoms: no abdominal pain, no chest pain, no congestion, no diarrhea, no fever, no headaches, no myalgias, no shortness of breath and no vomiting        Past Medical History:  Diagnosis Date  . Adenomatous colon polyp 01/2009  . Anemia   . BPH (benign prostatic hypertrophy)   . Cataract   . Chronic headaches   . Colon polyps   . Fatty liver   . GERD (gastroesophageal reflux disease)   . Heartburn   . Hepatic hemangioma   . Hiatal hernia   . Hypertension   . Internal hemorrhoids   . Leukopenia     Patient Active Problem List   Diagnosis Date Noted  . Hiatal hernia 12/25/2016  . Routine general medical examination at a health care facility 12/22/2015  . Precordial chest pain 12/20/2015  . 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 Lumbago 01/30/2014  . HLD (hyperlipidemia) 07/21/2013  . Hemorrhoids 04/14/2012  . BPH (benign prostatic hyperplasia) 04/14/2012  . PERSONAL HX COLONIC POLYPS 01/20/2011  . Essential hypertension 04/27/2007  . GERD 04/27/2007    Past Surgical History:  Procedure Laterality Date  . CATARACT EXTRACTION     Left eye  . COLONOSCOPY         Family History  Problem Relation Age of Onset  . Stomach cancer Mother   . Diabetes Father   . Diabetes  Brother   . Diabetes Sister   . Colon cancer Neg Hx   . Colon polyps Neg Hx   . Esophageal cancer Neg Hx     Social History   Tobacco Use  . Smoking status: Former Smoker    Quit date: 02/16/1981    Years since quitting: 39.6  . Smokeless tobacco: Never Used  Substance Use Topics  . Alcohol use: Yes    Alcohol/week: 2.0 - 4.0 standard drinks    Types: 2 - 4 Glasses of wine per week    Comment: 4-5 drinks week  . Drug use: No    Home Medications Prior to Admission medications   Medication Sig Start Date End Date Taking? Authorizing Provider  amLODipine (NORVASC) 5 MG tablet Take 5 mg by mouth daily. 05/21/18   [provider]  augmented betamethasone dipropionate (DIPROLENE-AF) 0.05 % ointment  12/24/19   [provider]  clotrimazole-betamethasone (LOTRISONE) cream Apply to both feet and between toes bid x 4 weeks. 03/30/20   Marzetta Board, DPM  colchicine 0.6 MG tablet Take 1 tablet (0.6 mg total) by mouth daily. 02/17/20   Felipa Furnace, DPM  dorzolamide-timolol (COSOPT) 22.3-6.8 MG/ML ophthalmic solution  11/28/19   [provider]  finasteride (PROSCAR) 5 MG tablet Take 5 mg by mouth daily. 03/09/20   [provider]  gabapentin (NEURONTIN) 100 MG capsule Take 100 mg by mouth at bedtime.    [provider]  gabapentin (NEURONTIN) 300 MG capsule Take 300 mg by mouth daily. 01/05/20   [provider]  latanoprost (XALATAN) 0.005 % ophthalmic solution Place 1 drop into both eyes at bedtime. 08/29/19   [provider]  losartan (COZAAR) 25 MG tablet Take 25 mg by mouth daily.    [provider]  losartan (COZAAR) 50 MG tablet Take 50 mg by mouth daily. 05/23/20   [provider]  methylPREDNISolone (MEDROL DOSEPAK) 4 MG TBPK tablet Take by mouth daily. Follow package instructions 03/22/20   Melynda Ripple, MD  omeprazole (PRILOSEC) 20 MG capsule Take 20 mg by mouth every morning. 08/25/19   [provider]  pantoprazole (PROTONIX) 40 MG tablet Take 40 mg by mouth daily. 03/09/20   [provider]  predniSONE (DELTASONE) 20 MG tablet 2 tabs po daily x 4 days 09/28/20   Deno Etienne, DO  tamsulosin (FLOMAX) 0.4 MG CAPS capsule Take 0.4 mg by mouth daily. 05/05/19   [provider]  terbinafine (LAMISIL) 250 MG tablet Take 250 mg by mouth daily.    [provider]  triamcinolone cream (KENALOG) 0.5 %  12/24/19   [provider]    Allergies    Gadolinium derivatives and Prilosec [omeprazole]  Review of Systems   Review of Systems  Constitutional: Negative for chills and fever.  HENT: Negative for congestion and facial swelling.   Eyes: Negative for discharge and visual disturbance.  Respiratory: Negative for shortness of breath.   Cardiovascular: Negative for chest pain and palpitations.  Gastrointestinal: Negative for abdominal pain, diarrhea and vomiting.  Musculoskeletal: Negative for arthralgias and myalgias.  Skin: Positive for rash. Negative for color change.  Neurological: Negative for tremors, syncope and headaches.  Psychiatric/Behavioral: Negative for confusion and dysphoric mood.    Physical Exam Updated Vital Signs There were no vitals taken for this visit.  Physical Exam Vitals and nursing note reviewed.  Constitutional:      Appearance: He is well-developed.  HENT:     Head: Normocephalic and atraumatic.  Eyes:     Pupils: Pupils are equal, round, and reactive to light.  Neck:     Vascular: No JVD.  Cardiovascular:     Rate and Rhythm: Normal rate and regular rhythm.     Heart sounds: No murmur heard.  No friction rub. No gallop.   Pulmonary:     Effort: No respiratory distress.     Breath sounds: No wheezing.  Abdominal:     General: There is no distension.     Tenderness: There is no guarding or rebound.  Musculoskeletal:        General: Normal range of motion.     Cervical back: Normal range of motion and neck  supple.  Skin:    Coloration: Skin is not pale.     Findings: Rash present.     Comments: Diffuse areas of erythema with some raised areas about the trunk.  Some purplish discoloration to the area on his right flank.  Pruritic.  Signs of excoriation.  Neurological:     Mental Status: He is alert and oriented to person, place, and time.  Psychiatric:        Behavior: Behavior normal.     ED Results / Procedures / Treatments   Labs (all labs ordered are listed, but only abnormal results are displayed) Labs Reviewed - No data to  display  EKG None  Radiology No results found.  Procedures Procedures (including critical care time)  Medications Ordered in ED Medications  predniSONE (DELTASONE) tablet 60 mg (has no administration in time range)    ED Course  I have reviewed the triage vital signs and the nursing notes.  Pertinent labs & imaging results that were available during my care of the patient were reviewed by me and considered in my medical decision making (see chart for details).    MDM Rules/Calculators/A&P                          79 yo M with a chief complaints of diffuse itchy rash.  Going on for the past few days.  I reviewed the patient's medical record and this is his 4th visit to an emergency setting for the same.  Discussed with him about following up with his family doctor and possibly being referred to dermatology if they felt it reasonable.  Suggest that he look at his soaps and detergents and change them if able.  We will do a burst of steroids here.  8:26 AM:  I have discussed the diagnosis/risks/treatment options with the patient and believe the pt to be eligible for discharge home to follow-up with PCP. We also discussed returning to the ED immediately if new or worsening sx occur. We discussed the sx which are most concerning (e.g., sudden worsening pain, fever, inability to tolerate by mouth) that necessitate immediate return. Medications administered to  the patient during their visit and any new prescriptions provided to the patient are listed below.  Medications given during this visit Medications  predniSONE (DELTASONE) tablet 60 mg (has no administration in time range)     The patient appears reasonably screen and/or stabilized for discharge and I doubt any other medical condition or other [facility] requiring further screening, evaluation, or treatment in the ED at this time prior to discharge.   Final Clinical Impression(s) / ED Diagnoses Final diagnoses:  Generalized rash    Rx / DC Orders ED Discharge Orders         Ordered    predniSONE (DELTASONE) 20 MG tablet        09/28/20 0821           Deno Etienne, DO 09/28/20 0827

## 2020-09-28 NOTE — ED Triage Notes (Signed)
Patient presents to ED with rash over entirety of torso, arms, and back.

## 2020-10-10 ENCOUNTER — Ambulatory Visit (INDEPENDENT_AMBULATORY_CARE_PROVIDER_SITE_OTHER): Payer: Medicare Other | Admitting: Podiatry

## 2020-10-10 ENCOUNTER — Other Ambulatory Visit: Payer: Self-pay

## 2020-10-10 ENCOUNTER — Ambulatory Visit (INDEPENDENT_AMBULATORY_CARE_PROVIDER_SITE_OTHER): Payer: Medicare Other

## 2020-10-10 ENCOUNTER — Encounter: Payer: Self-pay | Admitting: Podiatry

## 2020-10-10 DIAGNOSIS — R600 Localized edema: Secondary | ICD-10-CM

## 2020-10-10 DIAGNOSIS — M1000 Idiopathic gout, unspecified site: Secondary | ICD-10-CM | POA: Diagnosis not present

## 2020-10-10 DIAGNOSIS — M779 Enthesopathy, unspecified: Secondary | ICD-10-CM | POA: Diagnosis not present

## 2020-10-10 NOTE — Progress Notes (Signed)
Subjective:   Patient ID: Kenneth Roberts, male   DOB: 79 y.o.   MRN: [id number]   HPI Patient presents stating over the last few days he has developed swelling in both feet and has also had itching that he is taking hydroxyzine for.  States that the itching seems to really be bothering him and its been in his arms his torso and his back and he is being treated by his family physician and states the swelling has only been recent   ROS      Objective:  Physical Exam  Neurovascular status intact with patient found to have pitting edema of the right and left feet and into the ankles.  It is localized and I found negative Bevelyn Buckles' sign currently and he has no shortness of breath     Assessment:  Localized edema versus tendinitis or other inflammatory condition bilateral with not apparent systemic condition going on currentlyPossibility for this itching to be part of his problem and he will follow-up with family physician     Plan:  H&P condition reviewed and at this point I applied ankle compression stockings bilateral and advised if any pain in his legs should develop any shortness of breath to go straight to the emergency room I also want him following up with his family can disposition concerning the inflammation  X-rays were negative for signs of fracture or did not indicate a bony pathology

## 2020-11-06 ENCOUNTER — Encounter (HOSPITAL_COMMUNITY): Payer: Self-pay | Admitting: Emergency Medicine

## 2020-11-06 ENCOUNTER — Emergency Department (HOSPITAL_COMMUNITY): Payer: Medicare Other

## 2020-11-06 ENCOUNTER — Inpatient Hospital Stay (HOSPITAL_COMMUNITY)
Admission: EM | Admit: 2020-11-06 | Discharge: 2020-11-08 | DRG: 175 | Disposition: A | Discharge status: Home / Self Care | Payer: Medicare Other | Attending: Internal Medicine | Admitting: Internal Medicine

## 2020-11-06 ENCOUNTER — Other Ambulatory Visit: Payer: Self-pay

## 2020-11-06 DIAGNOSIS — J90 Pleural effusion, not elsewhere classified: Secondary | ICD-10-CM | POA: Diagnosis present

## 2020-11-06 DIAGNOSIS — I2693 Single subsegmental pulmonary embolism without acute cor pulmonale: Principal | ICD-10-CM | POA: Diagnosis present

## 2020-11-06 DIAGNOSIS — Z833 Family history of diabetes mellitus: Secondary | ICD-10-CM

## 2020-11-06 DIAGNOSIS — Z79899 Other long term (current) drug therapy: Secondary | ICD-10-CM

## 2020-11-06 DIAGNOSIS — Z7951 Long term (current) use of inhaled steroids: Secondary | ICD-10-CM

## 2020-11-06 DIAGNOSIS — Z888 Allergy status to other drugs, medicaments and biological substances status: Secondary | ICD-10-CM

## 2020-11-06 DIAGNOSIS — E785 Hyperlipidemia, unspecified: Secondary | ICD-10-CM | POA: Diagnosis present

## 2020-11-06 DIAGNOSIS — Z8616 Personal history of COVID-19: Secondary | ICD-10-CM

## 2020-11-06 DIAGNOSIS — J189 Pneumonia, unspecified organism: Secondary | ICD-10-CM | POA: Diagnosis present

## 2020-11-06 DIAGNOSIS — K760 Fatty (change of) liver, not elsewhere classified: Secondary | ICD-10-CM | POA: Diagnosis present

## 2020-11-06 DIAGNOSIS — G629 Polyneuropathy, unspecified: Secondary | ICD-10-CM | POA: Diagnosis present

## 2020-11-06 DIAGNOSIS — Z9842 Cataract extraction status, left eye: Secondary | ICD-10-CM

## 2020-11-06 DIAGNOSIS — I2699 Other pulmonary embolism without acute cor pulmonale: Secondary | ICD-10-CM | POA: Diagnosis present

## 2020-11-06 DIAGNOSIS — Z86010 Personal history of colonic polyps: Secondary | ICD-10-CM

## 2020-11-06 DIAGNOSIS — Z8619 Personal history of other infectious and parasitic diseases: Secondary | ICD-10-CM

## 2020-11-06 DIAGNOSIS — R0781 Pleurodynia: Secondary | ICD-10-CM | POA: Diagnosis not present

## 2020-11-06 DIAGNOSIS — R21 Rash and other nonspecific skin eruption: Secondary | ICD-10-CM | POA: Diagnosis present

## 2020-11-06 DIAGNOSIS — Z800 Family history of malignant neoplasm of digestive organs: Secondary | ICD-10-CM

## 2020-11-06 DIAGNOSIS — Z87891 Personal history of nicotine dependence: Secondary | ICD-10-CM

## 2020-11-06 DIAGNOSIS — K219 Gastro-esophageal reflux disease without esophagitis: Secondary | ICD-10-CM | POA: Diagnosis present

## 2020-11-06 DIAGNOSIS — Z20822 Contact with and (suspected) exposure to covid-19: Secondary | ICD-10-CM | POA: Diagnosis present

## 2020-11-06 DIAGNOSIS — N400 Enlarged prostate without lower urinary tract symptoms: Secondary | ICD-10-CM | POA: Diagnosis present

## 2020-11-06 DIAGNOSIS — I10 Essential (primary) hypertension: Secondary | ICD-10-CM | POA: Diagnosis present

## 2020-11-06 LAB — BASIC METABOLIC PANEL
Anion gap: 11 (ref 5–15)
BUN: 7 mg/dL — ABNORMAL LOW (ref 8–23)
CO2: 22 mmol/L (ref 22–32)
Calcium: 9.3 mg/dL (ref 8.9–10.3)
Chloride: 102 mmol/L (ref 98–111)
Creatinine, Ser: 0.97 mg/dL (ref 0.61–1.24)
GFR, Estimated: >60 mL/min (ref >60)
Glucose, Bld: 118 mg/dL — ABNORMAL HIGH (ref 70–99)
Potassium: 3.8 mmol/L (ref 3.5–5.1)
Sodium: 135 mmol/L (ref 135–145)

## 2020-11-06 LAB — RESP PANEL BY RT-PCR (FLU A&B, COVID) ARPGX2
Influenza A by PCR: NEGATIVE
Influenza B by PCR: NEGATIVE
SARS Coronavirus 2 by RT PCR: NEGATIVE

## 2020-11-06 LAB — CBC
HCT: 40.1 % (ref 39.0–52.0)
Hemoglobin: 13.5 g/dL (ref 13.0–17.0)
MCH: 32.1 pg (ref 26.0–34.0)
MCHC: 33.7 g/dL (ref 30.0–36.0)
MCV: 95.2 fL (ref 80.0–100.0)
Platelets: 196 10*3/uL (ref 150–400)
RBC: 4.21 MIL/uL — ABNORMAL LOW (ref 4.22–5.81)
RDW: 13.3 % (ref 11.5–15.5)
WBC: 6.6 10*3/uL (ref 4.0–10.5)
nRBC: 0 % (ref 0.0–0.2)

## 2020-11-06 LAB — TROPONIN I (HIGH SENSITIVITY)
Troponin I (High Sensitivity): 4 ng/L (ref <18)
Troponin I (High Sensitivity): 4 ng/L (ref <18)

## 2020-11-06 MED ORDER — LOSARTAN POTASSIUM 50 MG PO TABS
50.0000 mg | ORAL_TABLET | Freq: Every day | ORAL | Status: DC
Start: 2020-11-07 — End: 2020-11-08
  Administered 2020-11-07 – 2020-11-08 (×2): 50 mg via ORAL
  Filled 2020-11-06 (×2): qty 1

## 2020-11-06 MED ORDER — APIXABAN 5 MG PO TABS
5.0000 mg | ORAL_TABLET | Freq: Two times a day (BID) | ORAL | Status: DC
  Administered 2020-11-06 – 2020-11-07 (×2): 5 mg via ORAL
  Filled 2020-11-06 (×2): qty 1

## 2020-11-06 MED ORDER — ONDANSETRON HCL 4 MG PO TABS: 4.0000 mg | ORAL_TABLET | Freq: Four times a day (QID) | ORAL | Status: DC | PRN

## 2020-11-06 MED ORDER — ONDANSETRON HCL 4 MG/2ML IJ SOLN
4.0000 mg | Freq: Once | INTRAMUSCULAR | Status: AC
  Administered 2020-11-06: 4 mg via INTRAVENOUS
  Filled 2020-11-06: qty 2

## 2020-11-06 MED ORDER — ONDANSETRON HCL 4 MG/2ML IJ SOLN: 4.0000 mg | Freq: Four times a day (QID) | INTRAMUSCULAR | Status: DC | PRN

## 2020-11-06 MED ORDER — AZITHROMYCIN 250 MG PO TABS
500.0000 mg | ORAL_TABLET | Freq: Once | ORAL | Status: AC
  Administered 2020-11-06: 500 mg via ORAL
  Filled 2020-11-06: qty 2

## 2020-11-06 MED ORDER — PANTOPRAZOLE SODIUM 40 MG PO TBEC
40.0000 mg | DELAYED_RELEASE_TABLET | Freq: Every day | ORAL | Status: DC
  Administered 2020-11-07 – 2020-11-08 (×2): 40 mg via ORAL
  Filled 2020-11-06 (×2): qty 1

## 2020-11-06 MED ORDER — SODIUM CHLORIDE 0.9 % IV SOLN
1.0000 g | INTRAVENOUS | Status: DC
  Administered 2020-11-07: 1 g via INTRAVENOUS
  Filled 2020-11-06: qty 10

## 2020-11-06 MED ORDER — MORPHINE SULFATE (PF) 4 MG/ML IV SOLN
4.0000 mg | Freq: Once | INTRAVENOUS | Status: AC
Start: 2020-11-06 — End: 2020-11-06
  Administered 2020-11-06: 4 mg via INTRAVENOUS
  Filled 2020-11-06: qty 1

## 2020-11-06 MED ORDER — IBUPROFEN 600 MG PO TABS: 600.0000 mg | ORAL_TABLET | Freq: Four times a day (QID) | ORAL | Status: DC | PRN

## 2020-11-06 MED ORDER — SODIUM CHLORIDE 0.9 % IV SOLN
500.0000 mg | INTRAVENOUS | Status: DC
  Administered 2020-11-07 – 2020-11-08 (×2): 500 mg via INTRAVENOUS
  Filled 2020-11-06 (×2): qty 500

## 2020-11-06 MED ORDER — ACETAMINOPHEN 500 MG PO TABS
1000.0000 mg | ORAL_TABLET | Freq: Once | ORAL | Status: AC
  Administered 2020-11-06: 1000 mg via ORAL
  Filled 2020-11-06: qty 2

## 2020-11-06 MED ORDER — SODIUM CHLORIDE 0.9 % IV SOLN
1.0000 g | Freq: Once | INTRAVENOUS | Status: AC
  Administered 2020-11-06: 1 g via INTRAVENOUS
  Filled 2020-11-06: qty 10

## 2020-11-06 MED ORDER — TAMSULOSIN HCL 0.4 MG PO CAPS
0.4000 mg | ORAL_CAPSULE | Freq: Every day | ORAL | Status: DC
Start: 2020-11-07 — End: 2020-11-08
  Administered 2020-11-07 – 2020-11-08 (×2): 0.4 mg via ORAL
  Filled 2020-11-06 (×2): qty 1

## 2020-11-06 MED ORDER — MORPHINE SULFATE (PF) 4 MG/ML IV SOLN
4.0000 mg | Freq: Once | INTRAVENOUS | Status: AC
  Administered 2020-11-06: 4 mg via INTRAVENOUS
  Filled 2020-11-06: qty 1

## 2020-11-06 MED ORDER — HYDROCODONE-ACETAMINOPHEN 5-325 MG PO TABS
1.0000 | ORAL_TABLET | Freq: Four times a day (QID) | ORAL | Status: DC | PRN
  Administered 2020-11-06: 1 via ORAL
  Filled 2020-11-06: qty 1

## 2020-11-06 MED ORDER — FINASTERIDE 5 MG PO TABS
5.0000 mg | ORAL_TABLET | Freq: Every day | ORAL | Status: DC
  Administered 2020-11-07 – 2020-11-08 (×2): 5 mg via ORAL
  Filled 2020-11-06 (×2): qty 1

## 2020-11-06 MED ORDER — AMLODIPINE BESYLATE 5 MG PO TABS
5.0000 mg | ORAL_TABLET | Freq: Every day | ORAL | Status: DC
Start: 2020-11-07 — End: 2020-11-08
  Administered 2020-11-07 – 2020-11-08 (×2): 5 mg via ORAL
  Filled 2020-11-06 (×2): qty 1

## 2020-11-06 MED ORDER — IOHEXOL 350 MG/ML SOLN
75.0000 mL | Freq: Once | INTRAVENOUS | Status: AC | PRN
  Administered 2020-11-06: 75 mL via INTRAVENOUS

## 2020-11-06 NOTE — ED Triage Notes (Signed)
To ED via [facility]= Chest pain started last night, hurts all the time, no change with movement or resp-  Received ASA 324mg  and NTG x 1 with no relief

## 2020-11-06 NOTE — ED Provider Notes (Signed)
[facility] EMERGENCY DEPARTMENT Provider Note   CSN: [id number] Arrival date & time: 11/06/20  0931     History Chief Complaint  Patient presents with  . Chest Pain    Kenneth Roberts is a 80 y.o. male.  Patient is a 80 year old male with a history of anemia, hypertension, GERD and hyperlipidemia who is presenting today with a complaint of chest pain.  Patient reports the pain started last night at rest.  He reports he was talking to his daughter when he developed this sudden sharp pins-and-needles pain in his upper chest that radiated to his neck and between his shoulder blades.  It seems to be exacerbated by certain positions and taking a deep breath.  He has had an occasional cough which he does not feel is getting any worse and has been nonproductive.  He denies any issues with eating or drinking and it does not affect the pain.  Last night he even had to sit in the chair to sleep because he reported the cover being pulled up over his chest made it hurt worse.  He denies fever, headache, abdominal pain, nausea or vomiting.  He has been seen several times more recently for a rash reports he did have steroids for that and his doctor has been giving him some medication for pain but it did not work so he stopped taking it.  He cannot recall what that medication was.  He denies any prior history of lung disease or known heart disease.  He does not use inhalers at home and reports smoking sensation 40 years ago.  He reports currently the pain is always there but it comes in waves and get stronger and sometimes takes his breath away.  He has noticed some swelling in bilateral feet but denies any unilateral calf pain or any recent long travel or immobilization.  The history is provided by the patient and medical records.  Chest Pain      Past Medical History:  Diagnosis Date  . Adenomatous colon polyp 01/2009  . Anemia   . BPH (benign prostatic hypertrophy)   . Cataract   .  Chronic headaches   . Colon polyps   . Fatty liver   . GERD (gastroesophageal reflux disease)   . Heartburn   . Hepatic hemangioma   . Hiatal hernia   . Hypertension   . Internal hemorrhoids   . Leukopenia     Patient Active Problem List   Diagnosis Date Noted  . Hiatal hernia 12/25/2016  . Routine general medical examination at a health care facility 12/22/2015  . Precordial chest pain 12/20/2015  . 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 Lumbago 01/30/2014  . HLD (hyperlipidemia) 07/21/2013  . Hemorrhoids 04/14/2012  . BPH (benign prostatic hyperplasia) 04/14/2012  . PERSONAL HX COLONIC POLYPS 01/20/2011  . Essential hypertension 04/27/2007  . GERD 04/27/2007    Past Surgical History:  Procedure Laterality Date  . CATARACT EXTRACTION     Left eye  . COLONOSCOPY         Family History  Problem Relation Age of Onset  . Stomach cancer Mother   . Diabetes Father   . Diabetes Brother   . Diabetes Sister   . Colon cancer Neg Hx   . Colon polyps Neg Hx   . Esophageal cancer Neg Hx     Social History   Tobacco Use  . Smoking status: Former Smoker    Quit date: 02/16/1981    Years since quitting:  39.7  . Smokeless tobacco: Never Used  Substance Use Topics  . Alcohol use: Yes    Alcohol/week: 2.0 - 4.0 standard drinks    Types: 2 - 4 Glasses of wine per week    Comment: 4-5 drinks week  . Drug use: No    Home Medications Prior to Admission medications   Medication Sig Start Date End Date Taking? Authorizing Provider  amLODipine (NORVASC) 5 MG tablet Take 5 mg by mouth daily. 05/21/18   [provider]  augmented betamethasone dipropionate (DIPROLENE-AF) 0.05 % ointment  12/24/19   [provider]  clotrimazole-betamethasone (LOTRISONE) cream Apply to both feet and between toes bid x 4 weeks. 03/30/20   Marzetta Board, DPM  colchicine 0.6 MG tablet Take 1 tablet (0.6 mg total) by mouth daily. 02/17/20   Felipa Furnace, DPM   dorzolamide-timolol (COSOPT) 22.3-6.8 MG/ML ophthalmic solution  11/28/19   [provider]  finasteride (PROSCAR) 5 MG tablet Take 5 mg by mouth daily. 03/09/20   [provider]  gabapentin (NEURONTIN) 100 MG capsule Take 100 mg by mouth at bedtime.    [provider]  gabapentin (NEURONTIN) 300 MG capsule Take 300 mg by mouth daily. 01/05/20   [provider]  latanoprost (XALATAN) 0.005 % ophthalmic solution Place 1 drop into both eyes at bedtime. 08/29/19   [provider]  losartan (COZAAR) 25 MG tablet Take 25 mg by mouth daily.    [provider]  losartan (COZAAR) 50 MG tablet Take 50 mg by mouth daily. 05/23/20   [provider]  methylPREDNISolone (MEDROL DOSEPAK) 4 MG TBPK tablet Take by mouth daily. Follow package instructions 03/22/20   Melynda Ripple, MD  omeprazole (PRILOSEC) 20 MG capsule Take 20 mg by mouth every morning. 08/25/19   [provider]  pantoprazole (PROTONIX) 40 MG tablet Take 40 mg by mouth daily. 03/09/20   [provider]  predniSONE (DELTASONE) 20 MG tablet 2 tabs po daily x 4 days 09/28/20   Deno Etienne, DO  tamsulosin (FLOMAX) 0.4 MG CAPS capsule Take 0.4 mg by mouth daily. 05/05/19   [provider]  terbinafine (LAMISIL) 250 MG tablet Take 250 mg by mouth daily.    [provider]  triamcinolone cream (KENALOG) 0.5 %  12/24/19   [provider]    Allergies    Gadolinium derivatives and Prilosec [omeprazole]  Review of Systems   Review of Systems  Cardiovascular: Positive for chest pain.  All other systems reviewed and are negative.   Physical Exam Updated Vital Signs BP 135/90 (BP Location: Right Arm)   Pulse 90   Temp 98.7 F (37.1 C) (Oral)   Resp 16   Ht 5\' 11"  (1.803 m)   Wt 81.6 kg   SpO2 100%   BMI 25.10 kg/m   Physical Exam Vitals and nursing note reviewed.  Constitutional:      General: He is not in acute distress.     Appearance: He is well-developed, normal weight and well-nourished.     Comments: Appears comfortable and is speaking in full sentences and then will grimace and grab his chest when he is trying to breathe.  HENT:     Head: Normocephalic and atraumatic.     Nose: Nose normal.     Mouth/Throat:     Mouth: Oropharynx is clear and moist. Mucous membranes are moist.  Eyes:     Extraocular Movements: EOM normal.     Conjunctiva/sclera: Conjunctivae normal.  Pupils: Pupils are equal, round, and reactive to light.  Cardiovascular:     Rate and Rhythm: Normal rate and regular rhythm.     Pulses: Normal pulses and intact distal pulses.     Heart sounds: No murmur heard.   Pulmonary:     Effort: Pulmonary effort is normal. No respiratory distress.     Breath sounds: Normal breath sounds. No wheezing or rales.  Chest:     Chest wall: No tenderness.  Abdominal:     General: There is no distension.     Palpations: Abdomen is soft.     Tenderness: There is no abdominal tenderness. There is no guarding or rebound.  Musculoskeletal:        General: No tenderness or edema. Normal range of motion.     Cervical back: Normal range of motion and neck supple. No tenderness. No spinous process tenderness or muscular tenderness.     Comments: Minimal edema noted in bilateral ankles.  No back pain or neck pain  Lymphadenopathy:     Cervical: No cervical adenopathy.  Skin:    General: Skin is warm and dry.     Capillary Refill: Capillary refill takes less than 2 seconds.     Findings: Rash present. No erythema.     Comments: Excoriated rash noted on the upper chest and on the lower extremities.  No vesicles, drainage or erythema  Neurological:     General: No focal deficit present.     Mental Status: He is alert and oriented to person, place, and time. Mental status is at baseline.  Psychiatric:        Mood and Affect: Mood and affect and mood normal.        Behavior: Behavior normal.         Thought Content: Thought content normal.     ED Results / Procedures / Treatments   Labs (all labs ordered are listed, but only abnormal results are displayed) Labs Reviewed  BASIC METABOLIC PANEL - Abnormal; Notable for the following components:      Result Value   Glucose, Bld 118 (*)    BUN 7 (*)    All other components within normal limits  CBC - Abnormal; Notable for the following components:   RBC 4.21 (*)    All other components within normal limits  RESP PANEL BY RT-PCR (FLU A&B, COVID) [id number]  TROPONIN I (HIGH SENSITIVITY)  TROPONIN I (HIGH SENSITIVITY)    EKG EKG Interpretation  Date/Time:  Tuesday November 06 2020 09:26:29 EST Ventricular Rate:  105 PR Interval:  128 QRS Duration: 82 QT Interval:  358 QTC Calculation: 473 R Axis:   39 Text Interpretation: Sinus tachycardia Otherwise normal ECG No significant change since last tracing Confirmed by Gwyneth Sprout ([id number]) on 11/06/2020 3:50:46 PM   Radiology DG Chest 2 View  Result Date: 11/06/2020 CLINICAL DATA:  Chest pain EXAM: CHEST - 2 VIEW COMPARISON:  03/18/2018 FINDINGS: The heart size and mediastinal contours are within normal limits. Low lung volumes with mild streaky bibasilar opacities. No pleural effusion or pneumothorax. The visualized skeletal structures are unremarkable. IMPRESSION: Low lung volumes with mild streaky bibasilar opacities, likely atelectasis. A superimposed infectious process would be difficult to exclude. Electronically Signed   By: Duanne Guess D.O.   On: 11/06/2020 10:03    Procedures Procedures (including critical care time)  Medications Ordered in ED Medications  morphine 4 MG/ML injection 4 mg (has no administration in time range)  ondansetron (ZOFRAN) injection  4 mg (has no administration in time range)    ED Course  I have reviewed the triage vital signs and the nursing notes.  Pertinent labs & imaging results that were available during my care of the patient were  reviewed by me and considered in my medical decision making (see chart for details).    MDM Rules/Calculators/A&P                          Patient presenting today with sudden onset of chest pain that is been persistent since last night and reports worsening.  Occasional cough but denies any sputum production or fever.  Pain does seem to be pleuritic in nature and related to certain positions.  It is not reproduced with palpation.  Patient was tachycardic upon arrival but has improved.  Pain is not typical for ACS and EKG without evidence of pericarditis. Heart score low risk at 3.  Low suspicion for myocarditis at this time.  Concern for possible pneumonia versus PE versus COVID versus pleurisy.  She has no abdominal complaints and abdomen is negative with low suspicion for acute abdominal pathology.  Symptoms are not classic for GERD.  BMP within normal limits, CBC within normal limits, EKG was sinus tachycardia but no other acute findings, troponin within normal limits x1 with a level 4 and chest x-ray showing low lung volumes with mild streaky bibasilar opacities which could be atelectasis or possible superimposed infection.  Patient's oxygen saturation is 100% on room air and he speaking in full sentences but does intermittently appear uncomfortable.  We will get a delta troponin, CTA to rule out PE or other acute lung pathology.  Patient given pain control.  Also Covid testing pending.  7:43 PM Delta troponin is negative, Covid is negative, CT of the chest shows a small right upper lobe PE without evidence of heart strain as well as bibasilar patchy densities concerning for pneumonia.  Given patient's new cough and symptoms this is concerning.  Patient started on antibiotics as well as Eliquis.  Will admit for further care.  At this time he is in no acute distress.  MDM Number of Diagnoses or Management Options   Amount and/or Complexity of Data Reviewed Clinical lab tests: ordered and  reviewed Tests in the radiology section of CPT: ordered and reviewed Tests in the medicine section of CPT: ordered and reviewed Decide to obtain previous medical records or to obtain history from someone other than the patient: yes Obtain history from someone other than the patient: yes Review and summarize past medical records: yes Discuss the patient with other providers: yes Independent visualization of images, tracings, or specimens: yes  Risk of Complications, Morbidity, and/or Mortality Presenting problems: moderate Diagnostic procedures: moderate Management options: moderate  Patient Progress Patient progress: stable    Final Clinical Impression(s) / ED Diagnoses Final diagnoses:  Community acquired pneumonia, unspecified laterality  Single subsegmental pulmonary embolism without acute cor pulmonale ([place])    Rx / DC Orders ED Discharge Orders    None       Blanchie Dessert, MD 11/06/20 2323

## 2020-11-06 NOTE — ED Notes (Signed)
Admitting at bedside 

## 2020-11-06 NOTE — ED Notes (Signed)
This rn provided sandwich tray at this time, pt complaining because kitchen closes at 2000 and hot meal tray not available, pt explained delay.

## 2020-11-06 NOTE — H&P (Addendum)
History and Physical   Kenneth Roberts [id number] DOB: 04/05/1941 DOA: 11/06/2020  PCP: Dr. Jaclyn Shaggy Outpatient Specialists: [facility]  Patient coming from: home via EMS  I have personally briefly reviewed patient's old medical records in [facility] EMR.  Chief Concern: chest tenderness  HPI: Kenneth Roberts is a 80 y.o. male with medical history significant for hypertension, peripheral neuropathy, history of shingles, presented to the emergency department for chief concerns of chest discomfort.  Patient states that the chest pain started when he finished eating dinner, he has never had this pain before. The pain was sharp, 10/10 like someone stabbing him with a knife. The pain persisted and is worst with movements. He states the pain is not with palpation and he states he feels it 'on the inside'. He endorses shortness of breath.   He denies headache, vision changes.  He endorsed right lower back leg pain a couple of days prior to patient coming in.  He denies weight changes, international travel, plain travel.   ROS: Constitutional: no weight change, no fever ENT/Mouth: no sore throat, no rhinorrhea Eyes: no eye pain, no vision changes Cardiovascular: no chest pain, no dyspnea,  no edema, no palpitations Respiratory: + cough, no sputum, no wheezing Gastrointestinal: no nausea, no vomiting, no diarrhea, no constipation Genitourinary: no urinary incontinence, no dysuria, no hematuria Musculoskeletal: no arthralgias, no myalgias Skin: no skin lesions, no pruritus, Neuro: + weakness, no loss of consciousness, no syncope Psych: no anxiety, no depression, + decrease appetite Heme/Lymph: no bruising, no bleeding  ED Course: Discussed with ED provider, requesting hospitalization for pain control.  Assessment/Plan  Principal Problem:   Pulmonary embolism on right [facility]) Active Problems:   Essential hypertension   GERD   BPH (benign prostatic hyperplasia)    HLD (hyperlipidemia)   Pleuritic chest pain   Community acquired pneumonia   Right upper lobe pulmonary embolism-suspect secondary to possible DVT of the right lower extremity -Eliquis 5 mg twice daily started by ED provider -Bilateral ultrasound of the lower extremity to assess for DVT  Possible CAP-azithromycin and ceftriaxone  Chest discomfort -possible pericarditis versus myocarditis, echo ordered -Troponin high-sensitivity is 4x2 -Ibuprofen 600 mg PO every 6 hours as needed for pain  Hypertension-resumed home antihypertensives amlodipine 5 mg, losartan 50 mg daily  Chart reviewed.   DVT prophylaxis: On Eliquis 5 mg twice daily p.o. Code Status: Full code Diet: Heart healthy Family Communication: Patient states not needed Disposition Plan: Pending clinical course Consults called: None at this time Admission status: Observation with telemetry  Past Medical History:  Diagnosis Date  . Adenomatous colon polyp 01/2009  . Anemia   . BPH (benign prostatic hypertrophy)   . Cataract   . Chronic headaches   . Colon polyps   . Fatty liver   . GERD (gastroesophageal reflux disease)   . Heartburn   . Hepatic hemangioma   . Hiatal hernia   . Hypertension   . Internal hemorrhoids   . Leukopenia    Past Surgical History:  Procedure Laterality Date  . CATARACT EXTRACTION     Left eye  . COLONOSCOPY     Social History:  reports that he quit smoking about 39 years ago. He has never used smokeless tobacco. He reports current alcohol use of about 2.0 - 4.0 standard drinks of alcohol per week. He reports that he does not use drugs.  Allergies  Allergen Reactions  . Gadolinium Derivatives Other (See Comments)    unknown  . Prilosec [Omeprazole]  Itching   Family History  Problem Relation Age of Onset  . Stomach cancer Mother   . Diabetes Father   . Diabetes Brother   . Diabetes Sister   . Colon cancer Neg Hx   . Colon polyps Neg Hx   . Esophageal cancer Neg Hx    Family  history: Family history reviewed and not pertinent  Prior to Admission medications   Medication Sig Start Date End Date Taking? Authorizing Provider  amLODipine (NORVASC) 5 MG tablet Take 5 mg by mouth daily. 05/21/18   [provider]  augmented betamethasone dipropionate (DIPROLENE-AF) 0.05 % ointment  12/24/19   [provider]  clotrimazole-betamethasone (LOTRISONE) cream Apply to both feet and between toes bid x 4 weeks. 03/30/20   Marzetta Board, DPM  colchicine 0.6 MG tablet Take 1 tablet (0.6 mg total) by mouth daily. 02/17/20   Felipa Furnace, DPM  dorzolamide-timolol (COSOPT) 22.3-6.8 MG/ML ophthalmic solution  11/28/19   [provider]  finasteride (PROSCAR) 5 MG tablet Take 5 mg by mouth daily. 03/09/20   [provider]  gabapentin (NEURONTIN) 100 MG capsule Take 100 mg by mouth at bedtime.    [provider]  gabapentin (NEURONTIN) 300 MG capsule Take 300 mg by mouth daily. 01/05/20   [provider]  latanoprost (XALATAN) 0.005 % ophthalmic solution Place 1 drop into both eyes at bedtime. 08/29/19   [provider]  losartan (COZAAR) 25 MG tablet Take 25 mg by mouth daily.    [provider]  losartan (COZAAR) 50 MG tablet Take 50 mg by mouth daily. 05/23/20   [provider]  methylPREDNISolone (MEDROL DOSEPAK) 4 MG TBPK tablet Take by mouth daily. Follow package instructions 03/22/20   Melynda Ripple, MD  omeprazole (PRILOSEC) 20 MG capsule Take 20 mg by mouth every morning. 08/25/19   [provider]  pantoprazole (PROTONIX) 40 MG tablet Take 40 mg by mouth daily. 03/09/20   [provider]  predniSONE (DELTASONE) 20 MG tablet 2 tabs po daily x 4 days 09/28/20   Deno Etienne, DO  tamsulosin (FLOMAX) 0.4 MG CAPS capsule Take 0.4 mg by mouth daily. 05/05/19   [provider]  terbinafine (LAMISIL) 250 MG tablet Take 250 mg by mouth daily.    [provider]  triamcinolone  cream (KENALOG) 0.5 %  12/24/19   [provider]   Physical Exam: Vitals:   11/06/20 1745 11/06/20 1830 11/06/20 1921 11/06/20 2019  BP: (!) 158/101 (!) 144/93 (!) 153/91 (!) 145/89  Pulse: 73 72 77 89  Resp: 20 18 20 19   Temp:   98.2 F (36.8 C)   TempSrc:   Oral   SpO2: 100% 95% 96% 96%  Weight:      Height:       Constitutional: appears age-appropriate, NAD, calm, comfortable Eyes: PERRL, lids and conjunctivae normal ENMT: Mucous membranes are moist. Posterior pharynx clear of any exudate or lesions. Age-appropriate dentition. Hearing appropriate Neck: normal, supple, no masses, no thyromegaly Respiratory: clear to auscultation bilaterally, no wheezing, no crackles. Normal respiratory effort. No accessory muscle use.  Cardiovascular: Regular rate and rhythm, no murmurs / rubs / gallops. No extremity edema. 2+ pedal pulses. No carotid bruits.  Abdomen: no tenderness, no masses palpated, no hepatosplenomegaly. Bowel sounds positive.  Musculoskeletal: no clubbing / cyanosis. No joint deformity upper and lower extremities. Good ROM, no contractures, no atrophy. Normal muscle tone.  Skin: no rashes, lesions, ulcers. No induration Neurologic: Sensation intact.  Strength 5/5 in all 4.  Psychiatric: Normal judgment and insight. Alert and oriented x 3. Normal mood.   EKG: independently reviewed, showing sinus tachycardia, rate of 105, QTc 473, LVH  Chest x-ray on Admission: I personally reviewed and I agree with radiologist reading as below.  DG Chest 2 View  Result Date: 11/06/2020 CLINICAL DATA:  Chest pain EXAM: CHEST - 2 VIEW COMPARISON:  03/18/2018 FINDINGS: The heart size and mediastinal contours are within normal limits. Low lung volumes with mild streaky bibasilar opacities. No pleural effusion or pneumothorax. The visualized skeletal structures are unremarkable. IMPRESSION: Low lung volumes with mild streaky bibasilar opacities, likely atelectasis. A superimposed infectious  process would be difficult to exclude. Electronically Signed   By: Davina Poke D.O.   On: 11/06/2020 10:03   CT Angio Chest PE W and/or Wo Contrast  Result Date: 11/06/2020 CLINICAL DATA:  80 year old male with concern for pulmonary embolism. EXAM: CT ANGIOGRAPHY CHEST WITH CONTRAST TECHNIQUE: Multidetector CT imaging of the chest was performed using the standard protocol during bolus administration of intravenous contrast. Multiplanar CT image reconstructions and MIPs were obtained to evaluate the vascular anatomy. CONTRAST:  45mL OMNIPAQUE IOHEXOL 350 MG/ML SOLN COMPARISON:  Chest CT dated 11/06/2020. FINDINGS: Cardiovascular: There is no cardiomegaly or pericardial effusion. Mild atherosclerotic calcification of the thoracic aorta. There is a small pulmonary artery involving the right upper lobe lobar branch point. Faint linear density in the right lower lobe branches likely represent scarring sequela of prior PE. No CT evidence of right heart straining. Mediastinum/Nodes: No hilar or mediastinal adenopathy. The esophagus the thyroid gland are grossly unremarkable. No mediastinal fluid collection. Lungs/Pleura: Bibasilar patchy and streaky densities may represent atelectasis but concerning for pneumonia. Clinical correlation is recommended. There is a small pleural effusion. No pneumothorax. The central airways are patent. Upper Abdomen: Probable fatty liver. Indeterminate nodular density in the left upper abdomen, possibly splenic tissue. This was present on the prior CT of 2008. Musculoskeletal: No chest wall abnormality. No acute or significant osseous findings. Review of the MIP images confirms the above findings. IMPRESSION: 1. Small right upper lobe lobar pulmonary artery embolus. No CT evidence of right heart straining. 2. Bibasilar patchy and streaky densities may represent atelectasis but concerning for pneumonia. Clinical correlation is recommended. 3. Small right pleural effusion. 4. Aortic  Atherosclerosis (ICD10-I70.0). These results were called by telephone at the time of interpretation on 11/06/2020 at 6:28 pm to provider [facility] , who verbally acknowledged these results. Electronically Signed   By: Anner Crete M.D.   On: 11/06/2020 18:43   Labs on Admission: I have personally reviewed following labs  CBC: Recent Labs  Lab 11/06/20 0946  WBC 6.6  HGB 13.5  HCT 40.1  MCV 95.2  PLT 123456   Basic Metabolic Panel: Recent Labs  Lab 11/06/20 0946  NA 135  K 3.8  CL 102  CO2 22  GLUCOSE 118*  BUN 7*  CREATININE 0.97  CALCIUM 9.3   Urine analysis:    Component Value Date/Time   COLORURINE YELLOW 10/03/2019 0356   APPEARANCEUR CLEAR 10/03/2019 0356   LABSPEC 1.012 10/03/2019 0356   PHURINE 8.0 10/03/2019 0356   GLUCOSEU NEGATIVE 10/03/2019 0356   GLUCOSEU NEGATIVE 12/20/2015 1047   [place] 10/03/2019 [place] 10/03/2019 0356   BILIRUBINUR NEG 03/25/2013 1442   KETONESUR 5 (A) 10/03/2019 0356   PROTEINUR 30 (A) 10/03/2019 0356   UROBILINOGEN 0.2 12/20/2015 1047   NITRITE NEGATIVE 10/03/2019 0356  LEUKOCYTESUR NEGATIVE 10/03/2019 0356   Kenneth Roberts N Sana Tessmer D.O. [facility]  If 7PM-7AM, please contact overnight-coverage provider If 7AM-7PM, please contact day coverage provider [email or web address]  11/06/2020, 9:08 PM

## 2020-11-06 NOTE — ED Notes (Signed)
At change of shift Pt resting comfortably in stretcher, c/o chest pain reporting having blood clot in chest but no other complaints. Pt sinus rhythm on monitor, spo2 96% on room air. Other vss. Call bell in reach, side rails up

## 2020-11-07 ENCOUNTER — Observation Stay (HOSPITAL_BASED_OUTPATIENT_CLINIC_OR_DEPARTMENT_OTHER): Payer: Medicare Other

## 2020-11-07 ENCOUNTER — Observation Stay (HOSPITAL_COMMUNITY): Payer: Medicare Other

## 2020-11-07 DIAGNOSIS — Z20822 Contact with and (suspected) exposure to covid-19: Secondary | ICD-10-CM | POA: Diagnosis present

## 2020-11-07 DIAGNOSIS — Z7951 Long term (current) use of inhaled steroids: Secondary | ICD-10-CM | POA: Diagnosis not present

## 2020-11-07 DIAGNOSIS — Z87891 Personal history of nicotine dependence: Secondary | ICD-10-CM | POA: Diagnosis not present

## 2020-11-07 DIAGNOSIS — I2693 Single subsegmental pulmonary embolism without acute cor pulmonale: Secondary | ICD-10-CM | POA: Diagnosis not present

## 2020-11-07 DIAGNOSIS — Z833 Family history of diabetes mellitus: Secondary | ICD-10-CM | POA: Diagnosis not present

## 2020-11-07 DIAGNOSIS — M7989 Other specified soft tissue disorders: Secondary | ICD-10-CM | POA: Diagnosis not present

## 2020-11-07 DIAGNOSIS — J90 Pleural effusion, not elsewhere classified: Secondary | ICD-10-CM | POA: Diagnosis present

## 2020-11-07 DIAGNOSIS — G629 Polyneuropathy, unspecified: Secondary | ICD-10-CM | POA: Diagnosis present

## 2020-11-07 DIAGNOSIS — R0781 Pleurodynia: Secondary | ICD-10-CM | POA: Diagnosis present

## 2020-11-07 DIAGNOSIS — J189 Pneumonia, unspecified organism: Secondary | ICD-10-CM | POA: Diagnosis present

## 2020-11-07 DIAGNOSIS — Z800 Family history of malignant neoplasm of digestive organs: Secondary | ICD-10-CM | POA: Diagnosis not present

## 2020-11-07 DIAGNOSIS — I10 Essential (primary) hypertension: Secondary | ICD-10-CM | POA: Diagnosis present

## 2020-11-07 DIAGNOSIS — Z9842 Cataract extraction status, left eye: Secondary | ICD-10-CM | POA: Diagnosis not present

## 2020-11-07 DIAGNOSIS — E785 Hyperlipidemia, unspecified: Secondary | ICD-10-CM | POA: Diagnosis present

## 2020-11-07 DIAGNOSIS — N400 Enlarged prostate without lower urinary tract symptoms: Secondary | ICD-10-CM | POA: Diagnosis present

## 2020-11-07 DIAGNOSIS — R079 Chest pain, unspecified: Secondary | ICD-10-CM | POA: Diagnosis not present

## 2020-11-07 DIAGNOSIS — I2699 Other pulmonary embolism without acute cor pulmonale: Secondary | ICD-10-CM | POA: Diagnosis not present

## 2020-11-07 DIAGNOSIS — K219 Gastro-esophageal reflux disease without esophagitis: Secondary | ICD-10-CM | POA: Diagnosis present

## 2020-11-07 DIAGNOSIS — Z79899 Other long term (current) drug therapy: Secondary | ICD-10-CM | POA: Diagnosis not present

## 2020-11-07 DIAGNOSIS — Z86010 Personal history of colonic polyps: Secondary | ICD-10-CM | POA: Diagnosis not present

## 2020-11-07 DIAGNOSIS — Z8616 Personal history of COVID-19: Secondary | ICD-10-CM | POA: Diagnosis not present

## 2020-11-07 DIAGNOSIS — R21 Rash and other nonspecific skin eruption: Secondary | ICD-10-CM | POA: Diagnosis present

## 2020-11-07 DIAGNOSIS — K760 Fatty (change of) liver, not elsewhere classified: Secondary | ICD-10-CM | POA: Diagnosis present

## 2020-11-07 DIAGNOSIS — Z888 Allergy status to other drugs, medicaments and biological substances status: Secondary | ICD-10-CM | POA: Diagnosis not present

## 2020-11-07 DIAGNOSIS — Z8619 Personal history of other infectious and parasitic diseases: Secondary | ICD-10-CM | POA: Diagnosis not present

## 2020-11-07 LAB — CBC
HCT: 40.7 % (ref 39.0–52.0)
Hemoglobin: 14.1 g/dL (ref 13.0–17.0)
MCH: 32.6 pg (ref 26.0–34.0)
MCHC: 34.6 g/dL (ref 30.0–36.0)
MCV: 94 fL (ref 80.0–100.0)
Platelets: 187 10*3/uL (ref 150–400)
RBC: 4.33 MIL/uL (ref 4.22–5.81)
RDW: 13.3 % (ref 11.5–15.5)
WBC: 6.7 10*3/uL (ref 4.0–10.5)
nRBC: 0 % (ref 0.0–0.2)

## 2020-11-07 LAB — ECHOCARDIOGRAM COMPLETE
Area-P 1/2: 3.77 cm2
Height: 71 in
S' Lateral: 2.7 cm
Weight: 3038.24 oz

## 2020-11-07 LAB — BASIC METABOLIC PANEL
Anion gap: 13 (ref 5–15)
BUN: 9 mg/dL (ref 8–23)
CO2: 21 mmol/L — ABNORMAL LOW (ref 22–32)
Calcium: 8.8 mg/dL — ABNORMAL LOW (ref 8.9–10.3)
Chloride: 101 mmol/L (ref 98–111)
Creatinine, Ser: 0.94 mg/dL (ref 0.61–1.24)
GFR, Estimated: >60 mL/min (ref >60)
Glucose, Bld: 88 mg/dL (ref 70–99)
Potassium: 3.7 mmol/L (ref 3.5–5.1)
Sodium: 135 mmol/L (ref 135–145)

## 2020-11-07 LAB — TSH: TSH: 1.938 u[IU]/mL (ref 0.350–4.500)

## 2020-11-07 MED ORDER — APIXABAN 5 MG PO TABS: 5.0000 mg | ORAL_TABLET | Freq: Two times a day (BID) | ORAL | Status: DC

## 2020-11-07 MED ORDER — MELATONIN 3 MG PO TABS: 3.0000 mg | ORAL_TABLET | Freq: Every evening | ORAL | Status: DC | PRN

## 2020-11-07 MED ORDER — APIXABAN (ELIQUIS) EDUCATION KIT FOR DVT/PE PATIENTS
PACK | Freq: Once | Status: AC
  Filled 2020-11-07: qty 1

## 2020-11-07 MED ORDER — MORPHINE SULFATE (PF) 2 MG/ML IV SOLN
2.0000 mg | INTRAVENOUS | Status: AC | PRN
  Administered 2020-11-07 – 2020-11-08 (×4): 2 mg via INTRAVENOUS
  Filled 2020-11-07: qty 1
  Filled 2020-11-07: qty 2
  Filled 2020-11-07 (×2): qty 1

## 2020-11-07 MED ORDER — APIXABAN 5 MG PO TABS
10.0000 mg | ORAL_TABLET | Freq: Two times a day (BID) | ORAL | Status: DC
  Administered 2020-11-07 – 2020-11-08 (×2): 10 mg via ORAL
  Filled 2020-11-07 (×2): qty 2

## 2020-11-07 NOTE — Discharge Instructions (Addendum)
Community-Acquired Pneumonia, Adult Pneumonia is an infection of the lungs. It causes swelling in the airways of the lungs. Mucus and fluid may also build up inside the airways. One type of pneumonia can happen while a person is in a hospital. A different type can happen when a person is not in a hospital (community-acquired pneumonia).  What are the causes?  This condition is caused by germs (viruses, bacteria, or fungi). Some types of germs can be passed from one person to another. This can happen when you breathe in droplets from the cough or sneeze of an infected person. What increases the risk? You are more likely to develop this condition if you:  Have a long-term (chronic) disease, such as: ? Chronic obstructive pulmonary disease (COPD). ? Asthma. ? Cystic fibrosis. ? Congestive heart failure. ? Diabetes. ? Kidney disease.  Have HIV.  Have sickle cell disease.  Have had your spleen removed.  Do not take good care of your teeth and mouth (poor dental hygiene).  Have a medical condition that increases the risk of breathing in droplets from your own mouth and nose.  Have a weakened body defense system (immune system).  Are a smoker.  Travel to areas where the germs that cause this illness are common.  Are around certain animals or the places they live. What are the signs or symptoms?  A dry cough.  A wet (productive) cough.  Fever.  Sweating.  Chest pain. This often happens when breathing deeply or coughing.  Fast breathing or trouble breathing.  Shortness of breath.  Shaking chills.  Feeling tired (fatigue).  Muscle aches. How is this treated? Treatment for this condition depends on many things. Most adults can be treated at home. In some cases, treatment must happen in a hospital. Treatment may include:  Medicines given by mouth or through an IV tube.  Being given extra oxygen.  Respiratory therapy. In rare cases, treatment for very bad  pneumonia may include:  Using a machine to help you breathe.  Having a procedure to remove fluid from around your lungs. Follow these instructions at home: Medicines  Take over-the-counter and prescription medicines only as told by your doctor. ? Only take cough medicine if you are losing sleep.  If you were prescribed an antibiotic medicine, take it as told by your doctor. Do not stop taking the antibiotic even if you start to feel better. General instructions   Sleep with your head and neck raised (elevated). You can do this by sleeping in a recliner or by putting a few pillows under your head.  Rest as needed. Get at least 8 hours of sleep each night.  Drink enough water to keep your pee (urine) pale yellow.  Eat a healthy diet that includes plenty of vegetables, fruits, whole grains, low-fat dairy products, and lean protein.  Do not use any products that contain nicotine or tobacco. These include cigarettes, e-cigarettes, and chewing tobacco. If you need help quitting, ask your doctor.  Keep all follow-up visits as told by your doctor. This is important. How is this prevented? A shot (vaccine) can help prevent pneumonia. Shots are often suggested for:  People older than 78 years of age.  People older than 78 years of age who: ? Are having cancer treatment. ? Have long-term (chronic) lung disease. ? Have problems with their body's defense system. You may also prevent pneumonia if you take these actions:  Get the flu (influenza) shot every year.  Go to the dentist  as often as told.  Wash your hands often. If you cannot use soap and water, use hand sanitizer. Contact a doctor if:  You have a fever.  You lose sleep because your cough medicine does not help. Get help right away if:  You are short of breath and it gets worse.  You have more chest pain.  Your sickness gets worse. This is very serious if: ? You are an older adult. ? Your body's defense system is  weak.  You cough up blood. Summary  Pneumonia is an infection of the lungs.  Most adults can be treated at home. Some will need treatment in a hospital.  Drink enough water to keep your pee pale yellow.  Get at least 8 hours of sleep each night. This information is not intended to replace advice given to you by your health care provider. Make sure you discuss any questions you have with your health care provider. Document Revised: 02/09/2019 Document Reviewed: 06/17/2018 Elsevier Patient Education  2020 [employer]  Pulmonary Embolism  A pulmonary embolism (PE) is a sudden blockage or decrease of blood flow in one or both lungs. Most blockages come from a blood clot that forms in the vein of a lower leg, thigh, or arm (deep vein thrombosis, DVT) and travels to the lungs. A clot is blood that has thickened into a gel or solid. PE is a dangerous and life-threatening condition that needs to be treated right away. What are the causes? This condition is usually caused by a blood clot that forms in a vein and moves to the lungs. In rare cases, it may be caused by air, fat, part of a tumor, or other tissue that moves through the veins and into the lungs. What increases the risk? The following factors may make you more likely to develop this condition:  Experiencing a traumatic injury, such as breaking a hip or leg.  Having: ? A spinal cord injury. ? Orthopedic surgery, especially hip or knee replacement. ? Any major surgery. ? A stroke. ? DVT. ? Blood clots or blood clotting disease. ? Long-term (chronic) lung or heart disease. ? Cancer treated with chemotherapy. ? A central venous catheter.  Taking medicines that contain estrogen. These include birth control pills and hormone replacement therapy.  Being: ? Pregnant. ? In the period of time after your baby is delivered (postpartum). ? Older than age 78. ? Overweight. ? A smoker, especially if you have other risks. What are  the signs or symptoms? Symptoms of this condition usually start suddenly and include:  Shortness of breath during activity or at rest.  Coughing, coughing up blood, or coughing up blood-tinged mucus.  Chest pain that is often worse with deep breaths.  Rapid or irregular heartbeat.  Feeling light-headed or dizzy.  Fainting.  Feeling anxious.  Fever.  Sweating.  Pain and swelling in a leg. This is a symptom of DVT, which can lead to PE. How is this diagnosed? This condition may be diagnosed based on:  Your medical history.  A physical exam.  Blood tests.  CT pulmonary angiogram. This test checks blood flow in and around your lungs.  Ventilation-perfusion scan, also called a lung VQ scan. This test measures air flow and blood flow to the lungs.  An ultrasound of the legs. How is this treated? Treatment for this condition depends on many factors, such as the cause of your PE, your risk for bleeding or developing more clots, and other medical conditions you have.  Treatment aims to remove, dissolve, or stop blood clots from forming or growing larger. Treatment may include:  Medicines, such as: ? Blood thinning medicines (anticoagulants) to stop clots from forming. ? Medicines that dissolve clots (thrombolytics).  Procedures, such as: ? Using a flexible tube to remove a blood clot (embolectomy) or to deliver medicine to destroy it (catheter-directed thrombolysis). ? Inserting a filter into a large vein that carries blood to the heart (inferior vena cava). This filter (vena cava filter) catches blood clots before they reach the lungs. ? Surgery to remove the clot (surgical embolectomy). This is rare. You may need a combination of immediate, long-term (up to 3 months after diagnosis), and extended (more than 3 months after diagnosis) treatments. Your treatment may continue for several months (maintenance therapy). You and your health care provider will work together to choose  the treatment program that is best for you. Follow these instructions at home: Medicines  Take over-the-counter and prescription medicines only as told by your health care provider.  If you are taking an anticoagulant medicine: ? Take the medicine every day at the same time each day. ? Understand what foods and drugs interact with your medicine. ? Understand the side effects of this medicine, including excessive bruising or bleeding. Ask your health care provider or pharmacist about other side effects. General instructions  Wear a medical alert bracelet or carry a medical alert card that says you have had a PE and lists what medicines you take.  Ask your health care provider when you may return to your normal activities. Avoid sitting or lying for a long time without moving.  Maintain a healthy weight. Ask your health care provider what weight is healthy for you.  Do not use any products that contain nicotine or tobacco, such as cigarettes, e-cigarettes, and chewing tobacco. If you need help quitting, ask your health care provider.  Talk with your health care provider about any travel plans. It is important to make sure that you are still able to take your medicine while on trips.  Keep all follow-up visits as told by your health care provider. This is important. Contact a health care provider if:  You missed a dose of your blood thinner medicine. Get help right away if:  You have: ? New or increased pain, swelling, warmth, or redness in an arm or leg. ? Numbness or tingling in an arm or leg. ? Shortness of breath during activity or at rest. ? A fever. ? Chest pain. ? A rapid or irregular heartbeat. ? A severe headache. ? Vision changes. ? A serious fall or accident, or you hit your head. ? Stomach (abdominal) pain. ? Blood in your vomit, stool, or urine. ? A cut that will not stop bleeding.  You cough up blood.  You feel light-headed or dizzy.  You cannot move your arms  or legs.  You are confused or have memory loss. These symptoms may represent a serious problem that is an emergency. Do not wait to see if the symptoms will go away. Get medical help right away. Call your local emergency services (911 in the [place] Do not drive yourself to the hospital. Summary  A pulmonary embolism (PE) is a sudden blockage or decrease of blood flow in one or both lungs. PE is a dangerous and life-threatening condition that needs to be treated right away.  Treatments for this condition usually include medicines to thin your blood (anticoagulants) or medicines to break apart blood clots (thrombolytics).  If you are given blood thinners, it is important to take the medicine every day at the same time each day.  Understand what foods and drugs interact with any medicines that you are taking.  If you have signs of PE or DVT, call your local emergency services (911 in the [place] This information is not intended to replace advice given to you by your health care provider. Make sure you discuss any questions you have with your health care provider. Document Revised: 07/28/2018 Document Reviewed: 07/28/2018 Elsevier Patient Education  2020 [employer]. Information on my medicine - ELIQUIS (apixaban)  Why was Eliquis prescribed for you? Eliquis was prescribed to treat blood clots that may have been found in the veins of your legs (deep vein thrombosis) or in your lungs (pulmonary embolism) and to reduce the risk of them occurring again.  What do You need to know about Eliquis ? The starting dose is 10 mg (two 5 mg tablets) taken TWICE daily for the FIRST SEVEN (7) DAYS, then on 1/12  the dose is reduced to ONE 5 mg tablet taken TWICE daily.  Eliquis may be taken with or without food.   Try to take the dose about the same time in the morning and in the evening. If you have difficulty swallowing the tablet whole please discuss with your pharmacist how to take the medication  safely.  Take Eliquis exactly as prescribed and DO NOT stop taking Eliquis without talking to the doctor who prescribed the medication.  Stopping may increase your risk of developing a new blood clot.  Refill your prescription before you run out.  After discharge, you should have regular check-up appointments with your healthcare provider that is prescribing your Eliquis.    What do you do if you miss a dose? If a dose of ELIQUIS is not taken at the scheduled time, take it as soon as possible on the same day and twice-daily administration should be resumed. The dose should not be doubled to make up for a missed dose.  Important Safety Information A possible side effect of Eliquis is bleeding. You should call your healthcare provider right away if you experience any of the following: ? Bleeding from an injury or your nose that does not stop. ? Unusual colored urine (red or dark brown) or unusual colored stools (red or black). ? Unusual bruising for unknown reasons. ? A serious fall or if you hit your head (even if there is no bleeding).  Some medicines may interact with Eliquis and might increase your risk of bleeding or clotting while on Eliquis. To help avoid this, consult your healthcare provider or pharmacist prior to using any new prescription or non-prescription medications, including herbals, vitamins, non-steroidal anti-inflammatory drugs (NSAIDs) and supplements.  This website has more information on Eliquis (apixaban): [email or web address]

## 2020-11-07 NOTE — Progress Notes (Signed)
PROGRESS NOTE  Kenneth Roberts UUV:[id number] DOB: February 11, 1941 DOA: 11/06/2020 PCP: Pcp, No  HPI/Recap of past 24 hours: Kenneth Roberts is a 80 y.o. male with medical history significant for  essential hypertension, peripheral neuropathy, history of shingles, presented to the emergency department for chief concerns of chest discomfort, sharp pain 10 out of 10, worse with movement and with inspiration.  Work-up in the ED revealed small right upper lobe lobar pulmonary artery embolus- with no right heart strain on CT.  He was started on Eliquis in the ED.  11/07/20: Patient was seen and examined at his bedside.  He reports persistent left-sided pleuritic pain, worse with taking a deep breath.  He received IV morphine with mild improvement.  Assessment/Plan: Principal Problem:   Pulmonary embolism on right [facility]) Active Problems:   Essential hypertension   GERD   BPH (benign prostatic hyperplasia)   HLD (hyperlipidemia)   Pleuritic chest pain   Community acquired pneumonia  Right upper lobe pulmonary embolism, unclear etiology No right heart strain on CT scan Bilateral lower extremity Doppler ultrasound and 2D echo have been completed, pending results Troponin is negative x2. Started on Eliquis 5 mg twice daily in the ED, continue Maintain O2 saturation greater than 90%  Community-acquired pneumonia, POA Bibasilar infiltrates noted on CT scan with small right pleural effusion Reports prior history of Covid 19 infection about a year ago COVID-19 screening test negative on 11/06/2020 He is currently on Rocephin and azithromycin, continue for now Obtain CBC and procalcitonin level in the morning  Chest discomfort in the setting of acute pulmonary embolism Management as stated above. IV morphine 2 to 4 mg every 4 hours as needed for moderate to severe pain.  Add bowel regimen. Continue to closely monitor on telemetry  Essential hypertension BP is currently at goal Continue home  amlodipine 5 mg daily and losartan 50 mg daily  BPH Continue home Proscar and Flomax Monitor urine output  GERD Continue home PPI, Protonix 40 mg daily  Code Status: Full code  Family Communication: None at bedside  Disposition Plan: Likely will DC tomorrow 11/08/2018 22 months pleuritic chest pain has improved.   Consultants:    Procedures:  2D echo  Antimicrobials:  Rocephin  IV azithromycin  DVT prophylaxis:  Eliquis  Status is: Observation   Dispo: The patient is from: Home.              Anticipated d/c is to: Home.              Anticipated d/c date is: 11/08/2020              Patient currently not stable for discharge due to persistent pleuritic chest pain.  Awaiting 2D echo and bilateral lower extremities Doppler ultrasound results.        Objective: Vitals:   11/06/20 2352 11/07/20 0005 11/07/20 0434 11/07/20 0835  BP:  (!) 164/89 (!) 147/90 120/87  Pulse:  66 81 80  Resp:  20 20 18   Temp: 97.6 F (36.4 C) 97.9 F (36.6 C) 98.8 F (37.1 C)   TempSrc: Oral Oral Oral   SpO2:  99% 96% 95%  Weight:  86.1 kg 86.1 kg   Height:  5\' 11"  (1.803 m)      Intake/Output Summary (Last 24 hours) at 11/07/2020 1046 Last data filed at 11/06/2020 2049 Gross per 24 hour  Intake 100 ml  Output --  Net 100 ml   Filed Weights   11/06/20 0936 11/07/20 0005 11/07/20  0434  Weight: 81.6 kg 86.1 kg 86.1 kg    Exam:  . General: 80 y.o. year-old male well developed well nourished in no acute distress.  Alert and oriented x3. . Cardiovascular: Regular rate and rhythm with no rubs or gallops.  No thyromegaly or JVD noted.   Marland Kitchen Respiratory: Clear to auscultation with no wheezes or rales. Good inspiratory effort. . Abdomen: Soft nontender nondistended with normal bowel sounds x4 quadrants. . Musculoskeletal: Trace lower extremity edema bilaterally.   . Skin: No ulcerative lesions noted or rashes . Psychiatry: Mood is appropriate for condition and setting   Data  Reviewed: CBC: Recent Labs  Lab 11/06/20 0946 11/07/20 0455  WBC 6.6 6.7  HGB 13.5 14.1  HCT 40.1 40.7  MCV 95.2 94.0  PLT 196 [id number]   Basic Metabolic Panel: Recent Labs  Lab 11/06/20 0946 11/07/20 0455  NA 135 135  K 3.8 3.7  CL 102 101  CO2 22 21*  GLUCOSE 118* 88  BUN 7* 9  CREATININE 0.97 0.94  CALCIUM 9.3 8.8*   GFR: Estimated Creatinine Clearance: 70.1 mL/min (by C-G formula based on SCr of 0.94 mg/dL). Liver Function Tests: No results for input(s): AST, ALT, ALKPHOS, BILITOT, PROT, ALBUMIN in the last 168 hours. No results for input(s): LIPASE, AMYLASE in the last 168 hours. No results for input(s): AMMONIA in the last 168 hours. Coagulation Profile: No results for input(s): INR, PROTIME in the last 168 hours. Cardiac Enzymes: No results for input(s): CKTOTAL, CKMB, CKMBINDEX, TROPONINI in the last 168 hours. BNP (last 3 results) No results for input(s): PROBNP in the last 8760 hours. HbA1C: No results for input(s): HGBA1C in the last 72 hours. CBG: No results for input(s): GLUCAP in the last 168 hours. Lipid Profile: No results for input(s): CHOL, HDL, LDLCALC, TRIG, CHOLHDL, LDLDIRECT in the last 72 hours. Thyroid Function Tests: Recent Labs    11/07/20 0455  TSH 1.938   Anemia Panel: No results for input(s): VITAMINB12, FOLATE, FERRITIN, TIBC, IRON, RETICCTPCT in the last 72 hours. Urine analysis:    Component Value Date/Time   COLORURINE YELLOW 10/03/2019 0356   APPEARANCEUR CLEAR 10/03/2019 0356   LABSPEC 1.012 10/03/2019 0356   PHURINE 8.0 10/03/2019 0356   GLUCOSEU NEGATIVE 10/03/2019 0356   GLUCOSEU NEGATIVE 12/20/2015 1047   HGBUR NEGATIVE 10/03/2019 0356   BILIRUBINUR NEGATIVE 10/03/2019 0356   BILIRUBINUR NEG 03/25/2013 1442   KETONESUR 5 (A) 10/03/2019 0356   PROTEINUR 30 (A) 10/03/2019 0356   UROBILINOGEN 0.2 12/20/2015 1047   NITRITE NEGATIVE 10/03/2019 0356   LEUKOCYTESUR NEGATIVE 10/03/2019 0356   Sepsis  Labs: @LABRCNTIP (procalcitonin:4,lacticidven:4)  ) Recent Results (from the past 240 hour(s))  Resp Panel by RT-PCR (Flu A&B, Covid) Nasopharyngeal Swab     Status: None   Collection Time: 11/06/20  4:14 PM   Specimen: Nasopharyngeal Swab; Nasopharyngeal(NP) swabs in vial transport medium  Result Value Ref Range Status   SARS Coronavirus 2 by RT PCR NEGATIVE NEGATIVE Final    Comment: (NOTE) SARS-CoV-2 target nucleic acids are NOT DETECTED.  The SARS-CoV-2 RNA is generally detectable in upper respiratory specimens during the acute phase of infection. The lowest concentration of SARS-CoV-2 viral copies this assay can detect is 138 copies/mL. A negative result does not preclude SARS-Cov-2 infection and should not be used as the sole basis for treatment or other patient management decisions. A negative result may occur with  improper specimen collection/handling, submission of specimen other than nasopharyngeal swab, presence of viral mutation(s) within  the areas targeted by this assay, and inadequate number of viral copies(<138 copies/mL). A negative result must be combined with clinical observations, patient history, and epidemiological information. The expected result is Negative.  Fact Sheet for Patients:  EntrepreneurPulse.com.au  Fact Sheet for Healthcare Providers:  IncredibleEmployment.be  This test is no t yet approved or cleared by the [place] FDA and  has been authorized for detection and/or diagnosis of SARS-CoV-2 by FDA under an Emergency Use Authorization (EUA). This EUA will remain  in effect (meaning this test can be used) for the duration of the COVID-19 declaration under Section 564(b)(1) of the Act, 21 U.S.C.section 360bbb-3(b)(1), unless the authorization is terminated  or revoked sooner.       Influenza A by PCR NEGATIVE NEGATIVE Final   Influenza B by PCR NEGATIVE NEGATIVE Final    Comment: (NOTE) The Xpert  Xpress SARS-CoV-2/FLU/RSV plus assay is intended as an aid in the diagnosis of influenza from Nasopharyngeal swab specimens and should not be used as a sole basis for treatment. Nasal washings and aspirates are unacceptable for Xpert Xpress SARS-CoV-2/FLU/RSV testing.  Fact Sheet for Patients: EntrepreneurPulse.com.au  Fact Sheet for Healthcare Providers: IncredibleEmployment.be  This test is not yet approved or cleared by the [place] FDA and has been authorized for detection and/or diagnosis of SARS-CoV-2 by FDA under an Emergency Use Authorization (EUA). This EUA will remain in effect (meaning this test can be used) for the duration of the COVID-19 declaration under Section 564(b)(1) of the Act, 21 U.S.C. section 360bbb-3(b)(1), unless the authorization is terminated or revoked.  Performed at [facility], [place] [address], [place], [place] [address]       Studies: CT Angio Chest PE W and/or Wo Contrast  Result Date: 11/06/2020 CLINICAL DATA:  80 year old male with concern for pulmonary embolism. EXAM: CT ANGIOGRAPHY CHEST WITH CONTRAST TECHNIQUE: Multidetector CT imaging of the chest was performed using the standard protocol during bolus administration of intravenous contrast. Multiplanar CT image reconstructions and MIPs were obtained to evaluate the vascular anatomy. CONTRAST:  52mL OMNIPAQUE IOHEXOL 350 MG/ML SOLN COMPARISON:  Chest CT dated 11/06/2020. FINDINGS: Cardiovascular: There is no cardiomegaly or pericardial effusion. Mild atherosclerotic calcification of the thoracic aorta. There is a small pulmonary artery involving the right upper lobe lobar branch point. Faint linear density in the right lower lobe branches likely represent scarring sequela of prior PE. No CT evidence of right heart straining. Mediastinum/Nodes: No hilar or mediastinal adenopathy. The esophagus the thyroid gland are grossly unremarkable. No mediastinal fluid  collection. Lungs/Pleura: Bibasilar patchy and streaky densities may represent atelectasis but concerning for pneumonia. Clinical correlation is recommended. There is a small pleural effusion. No pneumothorax. The central airways are patent. Upper Abdomen: Probable fatty liver. Indeterminate nodular density in the left upper abdomen, possibly splenic tissue. This was present on the prior CT of 2008. Musculoskeletal: No chest wall abnormality. No acute or significant osseous findings. Review of the MIP images confirms the above findings. IMPRESSION: 1. Small right upper lobe lobar pulmonary artery embolus. No CT evidence of right heart straining. 2. Bibasilar patchy and streaky densities may represent atelectasis but concerning for pneumonia. Clinical correlation is recommended. 3. Small right pleural effusion. 4. Aortic Atherosclerosis (ICD10-I70.0). These results were called by telephone at the time of interpretation on 11/06/2020 at 6:28 pm to provider [facility] , who verbally acknowledged these results. Electronically Signed   By: Anner Crete M.D.   On: 11/06/2020 18:43    Scheduled Meds: .  amLODipine  5 mg Oral Daily  . apixaban  5 mg Oral BID  . finasteride  5 mg Oral Daily  . losartan  50 mg Oral Daily  . pantoprazole  40 mg Oral Daily  . tamsulosin  0.4 mg Oral Daily    Continuous Infusions: . azithromycin 500 mg (11/07/20 0944)  . cefTRIAXone (ROCEPHIN)  IV 1 g (11/07/20 0856)     LOS: 0 days     Kayleen Memos, MD [facility] Pager [phone]  If 7PM-7AM, please contact night-coverage [email or web address] Password [facility] 11/07/2020, 10:46 AM

## 2020-11-07 NOTE — TOC Benefit Eligibility Note (Signed)
Transition of Care [facility]) Benefit Eligibility Note    Patient Details  Name: Kenneth Roberts MRN: [id number] Date of Birth: 1941/09/02   Medication/Dose: Everlene Balls  5 MG BID        Prescription Coverage [facility] [place]  Spoke with Person/Company/Phone Number:: VEE Z. @  [employer] #  [phone]  Co-Pay: Alvester Morin  Prior Approval: No  Deductible:  (LOWE INCOME SUBSIDY LEVEL-2)  Additional Notes: SECONDARY INS: MEDICAID OF [place] : EFF-DATE: 07-04-2017  CO-PAY- $4.00  P/A- Ricky Ala Phone Number: 11/07/2020, 1:58 PM

## 2020-11-07 NOTE — Progress Notes (Signed)
  Echocardiogram 2D Echocardiogram with 3D has been performed.  Leta Jungling M 11/07/2020, 10:52 AM

## 2020-11-08 ENCOUNTER — Other Ambulatory Visit (HOSPITAL_COMMUNITY): Payer: Self-pay | Admitting: Internal Medicine

## 2020-11-08 DIAGNOSIS — I2699 Other pulmonary embolism without acute cor pulmonale: Secondary | ICD-10-CM | POA: Diagnosis not present

## 2020-11-08 LAB — PROCALCITONIN: Procalcitonin: <0.1 ng/mL

## 2020-11-08 MED ORDER — AMLODIPINE BESYLATE 5 MG PO TABS: 5.0000 mg | ORAL_TABLET | Freq: Every day | ORAL | 0 refills | Status: DC

## 2020-11-08 MED ORDER — FINASTERIDE 5 MG PO TABS: 5.0000 mg | ORAL_TABLET | Freq: Every day | ORAL | 0 refills | Status: DC

## 2020-11-08 MED ORDER — TAMSULOSIN HCL 0.4 MG PO CAPS: 0.4000 mg | ORAL_CAPSULE | Freq: Every day | ORAL | 0 refills | Status: DC

## 2020-11-08 MED ORDER — LOSARTAN POTASSIUM 50 MG PO TABS
50.0000 mg | ORAL_TABLET | Freq: Every day | ORAL | 0 refills | Status: DC
Start: 2020-11-09 — End: 2020-11-09

## 2020-11-08 MED ORDER — CEFDINIR 300 MG PO CAPS: 300.0000 mg | ORAL_CAPSULE | Freq: Two times a day (BID) | ORAL | 0 refills | Status: DC

## 2020-11-08 MED ORDER — APIXABAN (ELIQUIS) VTE STARTER PACK (10MG AND 5MG): ORAL_TABLET | ORAL | 0 refills | Status: DC

## 2020-11-08 MED ORDER — CEFDINIR 300 MG PO CAPS
300.0000 mg | ORAL_CAPSULE | Freq: Two times a day (BID) | ORAL | Status: DC
  Administered 2020-11-08: 300 mg via ORAL
  Filled 2020-11-08 (×2): qty 1

## 2020-11-08 NOTE — Care Management (Signed)
11-08-20 Bus pass provided to patient to get him home. Awaiting medications to be delivered via TOC. Gala Lewandowsky, RN,BSN Case Manager

## 2020-11-08 NOTE — Progress Notes (Signed)
Pt received discharge instructions and does not have any additional questions or concerns at this time. Pt encouraged to take medications as prescribed and follow up on his appointments. Pt is ready for discharge.

## 2020-11-08 NOTE — Evaluation (Signed)
Physical Therapy Evaluation Patient Details Name: Kenneth Roberts MRN: [id number] DOB: 09/01/1941 Today's Date: 11/08/2020   History of Present Illness  Pt adm with chest pain and found to have rt PE. PMH - HTN, peripheral neuropathy.  Clinical Impression  Pt doing well with mobility and no further PT needed.  Ready for dc from PT standpoint. Pt with SpO2 99% on RA.       Follow Up Recommendations No PT follow up    Equipment Recommendations  None recommended by PT    Recommendations for Other Services       Precautions / Restrictions Precautions Precautions: None      Mobility  Bed Mobility Overal bed mobility: Independent                  Transfers Overall transfer level: Independent Equipment used: None                Ambulation/Gait Ambulation/Gait assistance: Independent Gait Distance (Feet): 250 Feet Assistive device: None Gait Pattern/deviations: WFL(Within Functional Limits) Gait velocity: normal Gait velocity interpretation: >2.62 ft/sec, indicative of community ambulatory General Gait Details: STeady gait  Stairs            Wheelchair Mobility    Modified Rankin (Stroke Patients Only)       Balance Overall balance assessment: No apparent balance deficits (not formally assessed)                                           Pertinent Vitals/Pain Pain Assessment: No/denies pain    Home Living Family/patient expects to be discharged to:: Private residence Living Arrangements: Alone             Home Equipment: None      Prior Function Level of Independence: Independent               Hand Dominance        Extremity/Trunk Assessment   Upper Extremity Assessment Upper Extremity Assessment: Overall WFL for tasks assessed    Lower Extremity Assessment Lower Extremity Assessment: Overall WFL for tasks assessed       Communication   Communication: No difficulties  Cognition  Arousal/Alertness: Awake/alert Behavior During Therapy: WFL for tasks assessed/performed Overall Cognitive Status: Within Functional Limits for tasks assessed                                        General Comments General comments (skin integrity, edema, etc.): SpO2 99% with amb    Exercises     Assessment/Plan    PT Assessment Patent does not need any further PT services  PT Problem List         PT Treatment Interventions      PT Goals (Current goals can be found in the Care Plan section)  Acute Rehab PT Goals PT Goal Formulation: All assessment and education complete, DC therapy    Frequency     Barriers to discharge        Co-evaluation               AM-PAC PT "6 Clicks" Mobility  Outcome Measure Help needed turning from your back to your side while in a flat bed without using bedrails?: None Help needed moving from lying on your back to sitting on the  side of a flat bed without using bedrails?: None Help needed moving to and from a bed to a chair (including a wheelchair)?: None Help needed standing up from a chair using your arms (e.g., wheelchair or bedside chair)?: None Help needed to walk in hospital room?: None Help needed climbing 3-5 steps with a railing? : None 6 Click Score: 24    End of Session   Activity Tolerance: Patient tolerated treatment well Patient left: Other (comment) (in bathroom)   PT Visit Diagnosis: Other abnormalities of gait and mobility (R26.89)    Time: 3893-7342 PT Time Calculation (min) (ACUTE ONLY): 13 min   Charges:   PT Evaluation $PT Eval Low Complexity: 1 Low          [facility] PT Acute Rehabilitation Services Pager [phone] Office [phone]   Angelina Ok [facility] 11/08/2020, 10:39 AM

## 2020-11-08 NOTE — Progress Notes (Signed)
Pt extremely intolerant of PIV start. Stopped procedure. Dr. Delma Post in and ok'd pt to not have a PIV. Primary RN aware.

## 2020-11-08 NOTE — Discharge Summary (Signed)
Discharge Summary  Kenneth Roberts [id number] DOB: 1941/02/27  PCP: Pcp, No  Admit date: 11/06/2020 Discharge date: 11/08/2020  Time spent: 35 minutes  Recommendations for Outpatient Follow-up:  1. Follow-up with your PCP in 1 to 2 weeks. 2. Follow-up with your pulmonologist in 1 to 2 weeks. 3. Take your medications as prescribed 4. Continue fall precautions  Discharge Diagnoses:  Active Hospital Problems   Diagnosis Date Noted  . Pulmonary embolism on right ([place]) 11/06/2020  . Acute pulmonary embolism ([place]) 11/07/2020  . Pleuritic chest pain 11/06/2020  . Community acquired pneumonia 11/06/2020  . HLD (hyperlipidemia) 07/21/2013  . BPH (benign prostatic hyperplasia) 04/14/2012  . Essential hypertension 04/27/2007  . GERD 04/27/2007    Resolved Hospital Problems  No resolved problems to display.    Discharge Condition: Stable  Diet recommendation: Resume previous diet  Vitals:   11/08/20 0416 11/08/20 1103  BP: (!) 148/95   Pulse: 84   Resp: 18   Temp: (!) 97.3 F (36.3 C)   SpO2: 95% 93%    History of present illness:  Kenneth Roberts a 80 y.o.malewith medical history significant for essential hypertension,peripheral neuropathy, history of shingles, presented to the emergency department for chief concerns of chest discomfort, sharp pain 10 out of 10, worse with movement and with inspiration.  Work-up in the ED revealed small right upper lobe lobar pulmonary artery embolus- with no right heart strain on CT.  He was started on Eliquis in the ED.  A 2D echo was completed on 11/07/2020 showing normal LVEF 60 to 65% with grade 1 diastolic dysfunction.  He also had bilateral lower extremity Doppler ultrasound which were negative for DVT.  Patient was also treated with IV antibiotics Rocephin and IV azithromycin due to concern for early bibasilar pneumonia.  His COVID-19 screening test was negative.  He was switched to p.o. cefdinir to complete 8 days.  11/08/20:   Patient was seen and examined at his bedside.  Vital signs and labs reviewed and are stable.  He is eager to go home.   Hospital Course:  Principal Problem:   Pulmonary embolism on right [facility]) Active Problems:   Essential hypertension   GERD   BPH (benign prostatic hyperplasia)   HLD (hyperlipidemia)   Pleuritic chest pain   Community acquired pneumonia   Acute pulmonary embolism (HCC)  Right upper lobe pulmonary embolism likely secondary to a sedentary lifestyle No right heart strain on CT scan or on 2D echo Bilateral lower extremity Doppler ultrasound and 2D echo have been completed, results as stated above. Troponin is negative x2. O2 saturation 93% on room air Continue Eliquis  Follow-up with your PCP and pulmonary  Community-acquired pneumonia, POA Bibasilar infiltrates noted on CT scan with small right pleural effusion Reports prior history of Covid 19 infection about a year ago COVID-19 screening test negative on 11/06/2020 Received Rocephin and azithromycin. Switched to cefdinir on 11/08/2020 x 8 days. Follow-up with your PCP and pulmonary  Chest discomfort in the setting of acute pulmonary embolism Analgesics as needed  Essential hypertension BP is currently at goal Continue home amlodipine 5 mg daily and losartan 50 mg daily Follow-up with your PCP  BPH Continue home Proscar and Flomax  GERD Continue home PPI, Protonix 40 mg daily  Code Status: Full code    Consultants:  None.  Procedures:  2D echo  Antimicrobials:  Rocephin  IV azithromycin  Cefdinir from 11/08/2020  DVT prophylaxis:  Eliquis  Discharge Exam: BP (!) 148/95 (BP Location: Left  Arm)   Pulse 84   Temp (!) 97.3 F (36.3 C) (Oral)   Resp 18   Ht 5\' 11"  (1.803 m)   Wt 86.3 kg   SpO2 93%   BMI 26.54 kg/m  . General: 80 y.o. year-old male well developed well nourished in no acute distress.  Alert and oriented x3. . Cardiovascular: Regular rate and rhythm with no  rubs or gallops.  No thyromegaly or JVD noted.   Marland Kitchen Respiratory: Clear to auscultation with no wheezes or rales. Good inspiratory effort. . Abdomen: Soft nontender nondistended with normal bowel sounds x4 quadrants. . Musculoskeletal: Trace lower extremity edema bilaterally.   Marland Kitchen Psychiatry: Mood is appropriate for condition and setting  Discharge Instructions You were cared for by a hospitalist during your hospital stay. If you have any questions about your discharge medications or the care you received while you were in the hospital after you are discharged, you can call the unit and asked to speak with the hospitalist on call if the hospitalist that took care of you is not available. Once you are discharged, your primary care physician will handle any further medical issues. Please note that NO REFILLS for any discharge medications will be authorized once you are discharged, as it is imperative that you return to your primary care physician (or establish a relationship with a primary care physician if you do not have one) for your aftercare needs so that they can reassess your need for medications and monitor your lab values.   Allergies as of 11/08/2020      Reactions   Gadolinium Derivatives Other (See Comments)   unknown   Prilosec [omeprazole] Itching      Medication List    TAKE these medications   amLODipine 5 MG tablet Commonly known as: NORVASC Take 1 tablet (5 mg total) by mouth daily.   Apixaban Starter Pack (10mg  and 5mg ) Commonly known as: ELIQUIS STARTER PACK Take as directed on package: start with two-5mg  tablets twice daily for 7 days. On day 8, switch to one-5mg  tablet twice daily.   cefdinir 300 MG capsule Commonly known as: OMNICEF Take 1 capsule (300 mg total) by mouth 2 (two) times daily for 8 days.   dorzolamide-timolol 22.3-6.8 MG/ML ophthalmic solution Commonly known as: COSOPT Place 1 drop into both eyes 2 (two) times daily.   finasteride 5 MG  tablet Commonly known as: PROSCAR Take 1 tablet (5 mg total) by mouth daily.   hydrOXYzine 25 MG tablet Commonly known as: ATARAX/VISTARIL Take 25 mg by mouth 2 (two) times daily.   latanoprost 0.005 % ophthalmic solution Commonly known as: XALATAN Place 1 drop into both eyes at bedtime.   losartan 50 MG tablet Commonly known as: COZAAR Take 1 tablet (50 mg total) by mouth daily. Start taking on: November 09, 2020 What changed: when to take this   pantoprazole 40 MG tablet Commonly known as: PROTONIX Take 40 mg by mouth daily.   tamsulosin 0.4 MG Caps capsule Commonly known as: FLOMAX Take 1 capsule (0.4 mg total) by mouth daily.      Allergies  Allergen Reactions  . Gadolinium Derivatives Other (See Comments)    unknown  . Prilosec [Omeprazole] Itching    Follow-up Information    Ken Caryl. Call in 1 day(s).   Why: Please call for a post hospital follow-up appointment. Contact information: [place] [phone]       Charlott Rakes, MD.  Call in 1 day(s).   Specialty: Family Medicine Why: Please call for a post hospital follow-up appointment. Contact information: [address] [place] [address] [phone]        Ivonne Andrew, NP. Call in 1 day(s).   Specialty: Pulmonary Disease Why: Please call for a post hospital follow-up appointment. Contact information: [address] [place] [address] [phone]                The results of significant diagnostics from this hospitalization (including imaging, microbiology, ancillary and laboratory) are listed below for reference.    Significant Diagnostic Studies: DG Chest 2 View  Result Date: 11/06/2020 CLINICAL DATA:  Chest pain EXAM: CHEST - 2 VIEW COMPARISON:  03/18/2018 FINDINGS: The heart size and mediastinal contours are within normal limits. Low lung volumes with mild streaky bibasilar opacities. No  pleural effusion or pneumothorax. The visualized skeletal structures are unremarkable. IMPRESSION: Low lung volumes with mild streaky bibasilar opacities, likely atelectasis. A superimposed infectious process would be difficult to exclude. Electronically Signed   By: Duanne Guess D.O.   On: 11/06/2020 10:03   CT Angio Chest PE W and/or Wo Contrast  Result Date: 11/06/2020 CLINICAL DATA:  80 year old male with concern for pulmonary embolism. EXAM: CT ANGIOGRAPHY CHEST WITH CONTRAST TECHNIQUE: Multidetector CT imaging of the chest was performed using the standard protocol during bolus administration of intravenous contrast. Multiplanar CT image reconstructions and MIPs were obtained to evaluate the vascular anatomy. CONTRAST:  54mL OMNIPAQUE IOHEXOL 350 MG/ML SOLN COMPARISON:  Chest CT dated 11/06/2020. FINDINGS: Cardiovascular: There is no cardiomegaly or pericardial effusion. Mild atherosclerotic calcification of the thoracic aorta. There is a small pulmonary artery involving the right upper lobe lobar branch point. Faint linear density in the right lower lobe branches likely represent scarring sequela of prior PE. No CT evidence of right heart straining. Mediastinum/Nodes: No hilar or mediastinal adenopathy. The esophagus the thyroid gland are grossly unremarkable. No mediastinal fluid collection. Lungs/Pleura: Bibasilar patchy and streaky densities may represent atelectasis but concerning for pneumonia. Clinical correlation is recommended. There is a small pleural effusion. No pneumothorax. The central airways are patent. Upper Abdomen: Probable fatty liver. Indeterminate nodular density in the left upper abdomen, possibly splenic tissue. This was present on the prior CT of 2008. Musculoskeletal: No chest wall abnormality. No acute or significant osseous findings. Review of the MIP images confirms the above findings. IMPRESSION: 1. Small right upper lobe lobar pulmonary artery embolus. No CT evidence of  right heart straining. 2. Bibasilar patchy and streaky densities may represent atelectasis but concerning for pneumonia. Clinical correlation is recommended. 3. Small right pleural effusion. 4. Aortic Atherosclerosis (ICD10-I70.0). These results were called by telephone at the time of interpretation on 11/06/2020 at 6:28 pm to provider [facility] , who verbally acknowledged these results. Electronically Signed   By: Elgie Collard M.D.   On: 11/06/2020 18:43   DG Foot 2 Views Left  Result Date: 10/10/2020 Please see detailed radiograph report in office note.  DG Foot 2 Views Right  Result Date: 10/10/2020 Please see detailed radiograph report in office note.  ECHOCARDIOGRAM COMPLETE  Result Date: 11/07/2020    ECHOCARDIOGRAM REPORT   Patient Name:   Kenneth Roberts Date of Exam: 11/07/2020 Medical Rec #:  [id number]       Height:       71.0 in Accession #:    [id number]      Weight:       189.9 lb Date  of Birth:  1941-08-21       BSA:          2.063 m Patient Age:    29 years        BP:           120/87 mmHg Patient Gender: M               HR:           80 bpm. Exam Location:  Inpatient Procedure: 2D Echo, 3D Echo, Color Doppler, Cardiac Doppler and Strain Analysis Indications:    Chest Pain R07.9  History:        Patient has no prior history of Echocardiogram examinations.                 Right pulmonary embolism. Pneumonia.  Sonographer:    Darlina Sicilian RDCS Referring Phys: [phone] AMY N [facility] IMPRESSIONS  1. Left ventricular ejection fraction, by estimation, is 60 to 65%. The left ventricle has normal function. The left ventricle has no regional wall motion abnormalities. There is mild concentric left ventricular hypertrophy. Left ventricular diastolic parameters are consistent with Grade I diastolic dysfunction (impaired relaxation). The average left ventricular global longitudinal strain is -18.6 %. The global longitudinal strain is normal.  2. Right ventricular systolic function is normal. The  right ventricular size is normal.  3. The mitral valve is normal in structure. No evidence of mitral valve regurgitation. No evidence of mitral stenosis.  4. The aortic valve is normal in structure. Aortic valve regurgitation is not visualized. No aortic stenosis is present.  5. The inferior vena cava is normal in size with greater than 50% respiratory variability, suggesting right atrial pressure of 3 mmHg. FINDINGS  Left Ventricle: Left ventricular ejection fraction, by estimation, is 60 to 65%. The left ventricle has normal function. The left ventricle has no regional wall motion abnormalities. The average left ventricular global longitudinal strain is -18.6 %. The global longitudinal strain is normal. The left ventricular internal cavity size was normal in size. There is mild concentric left ventricular hypertrophy. Left ventricular diastolic parameters are consistent with Grade I diastolic dysfunction (impaired relaxation). Normal left ventricular filling pressure. Right Ventricle: The right ventricular size is normal. No increase in right ventricular wall thickness. Right ventricular systolic function is normal. Left Atrium: Left atrial size was normal in size. Right Atrium: Right atrial size was normal in size. Pericardium: There is no evidence of pericardial effusion. Mitral Valve: The mitral valve is normal in structure. No evidence of mitral valve regurgitation. No evidence of mitral valve stenosis. Tricuspid Valve: The tricuspid valve is normal in structure. Tricuspid valve regurgitation is not demonstrated. No evidence of tricuspid stenosis. Aortic Valve: The aortic valve is normal in structure. Aortic valve regurgitation is not visualized. No aortic stenosis is present. Pulmonic Valve: The pulmonic valve was normal in structure. Pulmonic valve regurgitation is trivial. No evidence of pulmonic stenosis. Aorta: The aortic root is normal in size and structure. Venous: The inferior vena cava is normal in  size with greater than 50% respiratory variability, suggesting right atrial pressure of 3 mmHg. IAS/Shunts: No atrial level shunt detected by color flow Doppler.  LEFT VENTRICLE PLAX 2D LVIDd:         3.70 cm  Diastology LVIDs:         2.70 cm  LV e' medial:    4.62 cm/s LV PW:         1.20 cm  LV E/e' medial:  13.1 LV IVS:        1.30 cm  LV e' lateral:   6.65 cm/s LVOT diam:     2.00 cm  LV E/e' lateral: 9.1 LV SV:         60 LV SV Index:   29       2D Longitudinal Strain LVOT Area:     3.14 cm 2D Strain GLS (A2C):   -20.3 %                         2D Strain GLS (A3C):   -17.0 %                         2D Strain GLS (A4C):   -18.4 %                         2D Strain GLS Avg:     -18.6 %                          3D Volume EF:                         3D EF:        51 %                         LV EDV:       121 ml                         LV ESV:       59 ml                         LV SV:        62 ml RIGHT VENTRICLE RV S prime:     16.50 cm/s TAPSE (M-mode): 2.2 cm LEFT ATRIUM             Index       RIGHT ATRIUM           Index LA diam:        2.80 cm 1.36 cm/m  RA Area:     12.50 cm LA Vol (A2C):   31.6 ml 15.32 ml/m RA Volume:   26.00 ml  12.61 ml/m LA Vol (A4C):   40.3 ml 19.54 ml/m LA Biplane Vol: 37.2 ml 18.04 ml/m  AORTIC VALVE LVOT Vmax:   89.40 cm/s LVOT Vmean:  59.700 cm/s LVOT VTI:    0.191 m  AORTA Ao Root diam: 2.90 cm Ao Asc diam:  3.20 cm MITRAL VALVE MV Area (PHT): 3.77 cm    SHUNTS MV Decel Time: 201 msec    Systemic VTI:  0.19 m MV E velocity: 60.70 cm/s  Systemic Diam: 2.00 cm MV A velocity: 73.40 cm/s MV E/A ratio:  0.83 Fransico Him MD Electronically signed by Fransico Him MD Signature Date/Time: 11/07/2020/10:56:31 AM    Final    VAS [place] LOWER EXTREMITY VENOUS (DVT)  Result Date: 11/07/2020  Lower Venous DVT Study Indications: Swelling.  Comparison Study: No previous exam Performing Technologist: Vonzell Schlatter RVT  Examination Guidelines: A complete evaluation includes B-mode imaging,  spectral Doppler, color Doppler, and power Doppler as needed of all accessible portions of each vessel. Bilateral testing is considered  an integral part of a complete examination. Limited examinations for reoccurring indications may be performed as noted. The reflux portion of the exam is performed with the patient in reverse Trendelenburg.  +---------+---------------+---------+-----------+----------+--------------+ RIGHT    CompressibilityPhasicitySpontaneityPropertiesThrombus Aging +---------+---------------+---------+-----------+----------+--------------+ CFV      Full           Yes      Yes                                 +---------+---------------+---------+-----------+----------+--------------+ SFJ      Full                                                        +---------+---------------+---------+-----------+----------+--------------+ FV Prox  Full                                                        +---------+---------------+---------+-----------+----------+--------------+ FV Mid   Full                                                        +---------+---------------+---------+-----------+----------+--------------+ FV DistalFull                                                        +---------+---------------+---------+-----------+----------+--------------+ PFV      Full                                                        +---------+---------------+---------+-----------+----------+--------------+ POP      Full           Yes      Yes                                 +---------+---------------+---------+-----------+----------+--------------+ PTV      Full                                                        +---------+---------------+---------+-----------+----------+--------------+ PERO     Full                                                        +---------+---------------+---------+-----------+----------+--------------+    +---------+---------------+---------+-----------+----------+--------------+ LEFT     CompressibilityPhasicitySpontaneityPropertiesThrombus Aging +---------+---------------+---------+-----------+----------+--------------+ CFV  Full           Yes      Yes                                 +---------+---------------+---------+-----------+----------+--------------+ SFJ      Full                                                        +---------+---------------+---------+-----------+----------+--------------+ FV Prox  Full                                                        +---------+---------------+---------+-----------+----------+--------------+ FV Mid   Full                                                        +---------+---------------+---------+-----------+----------+--------------+ FV DistalFull                                                        +---------+---------------+---------+-----------+----------+--------------+ PFV      Full                                                        +---------+---------------+---------+-----------+----------+--------------+ POP      Full           Yes      Yes                                 +---------+---------------+---------+-----------+----------+--------------+ PTV      Full                                                        +---------+---------------+---------+-----------+----------+--------------+ PERO     Full                                                        +---------+---------------+---------+-----------+----------+--------------+     Summary: RIGHT: - There is no evidence of deep vein thrombosis in the lower extremity.  - No cystic structure found in the popliteal fossa.  LEFT: - There is no evidence of deep vein thrombosis in the lower extremity.  - No cystic structure found in the popliteal fossa.  *See table(s) above for measurements and observations.  Electronically signed  by Jamelle Haring on 11/07/2020 at 12:28:16 PM.    Final     Microbiology: Recent Results (from the past 240 hour(s))  Resp Panel by RT-PCR (Flu A&B, Covid) Nasopharyngeal Swab     Status: None   Collection Time: 11/06/20  4:14 PM   Specimen: Nasopharyngeal Swab; Nasopharyngeal(NP) swabs in vial transport medium  Result Value Ref Range Status   SARS Coronavirus 2 by RT PCR NEGATIVE NEGATIVE Final    Comment: (NOTE) SARS-CoV-2 target nucleic acids are NOT DETECTED.  The SARS-CoV-2 RNA is generally detectable in upper respiratory specimens during the acute phase of infection. The lowest concentration of SARS-CoV-2 viral copies this assay can detect is 138 copies/mL. A negative result does not preclude SARS-Cov-2 infection and should not be used as the sole basis for treatment or other patient management decisions. A negative result may occur with  improper specimen collection/handling, submission of specimen other than nasopharyngeal swab, presence of viral mutation(s) within the areas targeted by this assay, and inadequate number of viral copies(<138 copies/mL). A negative result must be combined with clinical observations, patient history, and epidemiological information. The expected result is Negative.  Fact Sheet for Patients:  EntrepreneurPulse.com.au  Fact Sheet for Healthcare Providers:  IncredibleEmployment.be  This test is no t yet approved or cleared by the [place] FDA and  has been authorized for detection and/or diagnosis of SARS-CoV-2 by FDA under an Emergency Use Authorization (EUA). This EUA will remain  in effect (meaning this test can be used) for the duration of the COVID-19 declaration under Section 564(b)(1) of the Act, 21 U.S.C.section 360bbb-3(b)(1), unless the authorization is terminated  or revoked sooner.       Influenza A by PCR NEGATIVE NEGATIVE Final   Influenza B by PCR NEGATIVE NEGATIVE Final    Comment:  (NOTE) The Xpert Xpress SARS-CoV-2/FLU/RSV plus assay is intended as an aid in the diagnosis of influenza from Nasopharyngeal swab specimens and should not be used as a sole basis for treatment. Nasal washings and aspirates are unacceptable for Xpert Xpress SARS-CoV-2/FLU/RSV testing.  Fact Sheet for Patients: EntrepreneurPulse.com.au  Fact Sheet for Healthcare Providers: IncredibleEmployment.be  This test is not yet approved or cleared by the [place] FDA and has been authorized for detection and/or diagnosis of SARS-CoV-2 by FDA under an Emergency Use Authorization (EUA). This EUA will remain in effect (meaning this test can be used) for the duration of the COVID-19 declaration under Section 564(b)(1) of the Act, 21 U.S.C. section 360bbb-3(b)(1), unless the authorization is terminated or revoked.  Performed at [facility], [place] [address] [place], [place] [address]      Labs: Basic Metabolic Panel: Recent Labs  Lab 11/06/20 0946 11/07/20 0455  NA 135 135  K 3.8 3.7  CL 102 101  CO2 22 21*  GLUCOSE 118* 88  BUN 7* 9  CREATININE 0.97 0.94  CALCIUM 9.3 8.8*   Liver Function Tests: No results for input(s): AST, ALT, ALKPHOS, BILITOT, PROT, ALBUMIN in the last 168 hours. No results for input(s): LIPASE, AMYLASE in the last 168 hours. No results for input(s): AMMONIA in the last 168 hours. CBC: Recent Labs  Lab 11/06/20 0946 11/07/20 0455  WBC 6.6 6.7  HGB 13.5 14.1  HCT 40.1 40.7  MCV 95.2 94.0  PLT 196 187   Cardiac Enzymes: No results for input(s): CKTOTAL, CKMB, CKMBINDEX, TROPONINI in the last 168 hours. BNP: BNP (last 3 results) No results for input(s): BNP in the  last 8760 hours.  ProBNP (last 3 results) No results for input(s): PROBNP in the last 8760 hours.  CBG: No results for input(s): GLUCAP in the last 168 hours.     Signed:  Kayleen Memos, MD [facility] 11/08/2020, 12:29 PM

## 2020-11-08 NOTE — Progress Notes (Signed)
   11/08/20 1000  Clinical Encounter Type  Visited With Patient  Visit Type Initial  Referral From Nurse  Consult/Referral To Chaplain  Spiritual Encounters  Spiritual Needs Prayer  Stress Factors  Patient Stress Factors None identified  Family Stress Factors None identified  Patient was on consult list to see a chaplain. He does not remember asking for one! We had a good visit and he is going home today.

## 2020-11-23 ENCOUNTER — Ambulatory Visit: Payer: Medicare Other | Admitting: Podiatry

## 2020-12-04 ENCOUNTER — Ambulatory Visit (INDEPENDENT_AMBULATORY_CARE_PROVIDER_SITE_OTHER): Payer: Medicare Other | Admitting: Nurse Practitioner

## 2020-12-04 VITALS — BP 142/84 | HR 68 | Temp 97.5°F | Ht 71.0 in | Wt 193.0 lb

## 2020-12-04 DIAGNOSIS — J189 Pneumonia, unspecified organism: Secondary | ICD-10-CM

## 2020-12-04 DIAGNOSIS — I2699 Other pulmonary embolism without acute cor pulmonale: Secondary | ICD-10-CM

## 2020-12-04 NOTE — Progress Notes (Signed)
@Patient  ID: Kenneth Roberts, male    DOB: [birth year], 80 y.o.   MRN: [id number]  Chief Complaint  Patient presents with  . Hospitalization Follow-up    In hospital for pneumonia was found to have PE, patient thought he was coming here for Pulmonary    Referring provider: No ref. provider found   80 y.o.malewith medical history significant foressential hypertension,peripheral neuropathy, history of shingles.   HPI  Patient presents today for post COVID care clinic visit/ED follow-up.  Patient was admitted to the hospital on 11/06/2020 through 11/08/2020 for pneumonia.  Patient did not have Covid.  Patient does not currently have a PCP.  We will get him an appointment set up to follow-up with PCP before and visit today.  Patient was treated with Rocephin, azithromycin, cefdinir.  Patient was found to have PE with no right heart strain noted.  Patient was started on Eliquis.  He states that he is compliant with his medication.  Overall patient states that he is doing well. Denies f/c/s, n/v/d, hemoptysis, PND, chest pain or edema.       Allergies  Allergen Reactions  . Gadolinium Derivatives Other (See Comments)    unknown  . Prilosec [Omeprazole] Itching    Immunization History  Administered Date(s) Administered  . Pneumococcal Conjugate-13 12/20/2015  . Td 04/28/2005  . Tdap 12/20/2015  . Zoster Recombinat (Shingrix) 09/22/2018    Past Medical History:  Diagnosis Date  . Adenomatous colon polyp 01/2009  . Anemia   . BPH (benign prostatic hypertrophy)   . Cataract   . Chronic headaches   . Colon polyps   . Fatty liver   . GERD (gastroesophageal reflux disease)   . Heartburn   . Hepatic hemangioma   . Hiatal hernia   . Hypertension   . Internal hemorrhoids   . Leukopenia     Tobacco History: Social History   Tobacco Use  Smoking Status Former Smoker  . Quit date: 02/16/1981  . Years since quitting: 39.8  Smokeless Tobacco Never Used   Counseling given:  Yes   Outpatient Encounter Medications as of 12/04/2020  Medication Sig  . amLODipine (NORVASC) 5 MG tablet Take 1 tablet (5 mg total) by mouth daily.  . APIXABAN (ELIQUIS) VTE STARTER PACK (10MG  AND 5MG ) Take as directed on package: start with two-5mg  tablets twice daily for 7 days. On day 8, switch to one-5mg  tablet twice daily.  . dorzolamide-timolol (COSOPT) 22.3-6.8 MG/ML ophthalmic solution Place 1 drop into both eyes 2 (two) times daily.  . finasteride (PROSCAR) 5 MG tablet Take 1 tablet (5 mg total) by mouth daily.  . hydrOXYzine (ATARAX/VISTARIL) 25 MG tablet Take 25 mg by mouth 2 (two) times daily.  Marland Kitchen latanoprost (XALATAN) 0.005 % ophthalmic solution Place 1 drop into both eyes at bedtime.  . pantoprazole (PROTONIX) 40 MG tablet Take 40 mg by mouth daily.  Marland Kitchen losartan (COZAAR) 50 MG tablet Take 1 tablet (50 mg total) by mouth daily. (Patient not taking: Reported on 12/04/2020)  . tamsulosin (FLOMAX) 0.4 MG CAPS capsule Take 1 capsule (0.4 mg total) by mouth daily. (Patient not taking: Reported on 12/04/2020)   No facility-administered encounter medications on file as of 12/04/2020.     Review of Systems  Review of Systems  Constitutional: Negative.  Negative for fatigue and fever.  HENT: Negative.   Respiratory: Positive for cough and shortness of breath.   Cardiovascular: Negative.  Negative for chest pain, palpitations and leg swelling.  Gastrointestinal: Negative.  Allergic/Immunologic: Negative.   Neurological: Negative.   Psychiatric/Behavioral: Negative.        Physical Exam  BP (!) 142/84 (BP Location: Left Arm)   Pulse 68   Temp (!) 97.5 F (36.4 C)   Ht 5\' 11"  (1.803 m)   Wt 193 lb 0.1 oz (87.5 kg)   SpO2 96%   BMI 26.92 kg/m   Wt Readings from Last 5 Encounters:  12/04/20 193 lb 0.1 oz (87.5 kg)  11/08/20 190 lb 4.8 oz (86.3 kg)  03/22/20 180 lb (81.6 kg)  10/03/19 180 lb (81.6 kg)  09/01/19 193 lb 5 oz (87.7 kg)     Physical Exam Vitals and nursing  note reviewed.  Constitutional:      General: He is not in acute distress.    Appearance: He is well-developed and well-nourished.  Cardiovascular:     Rate and Rhythm: Normal rate and regular rhythm.  Pulmonary:     Effort: Pulmonary effort is normal.     Breath sounds: Normal breath sounds.  Skin:    General: Skin is warm and dry.  Neurological:     Mental Status: He is alert and oriented to person, place, and time.  Psychiatric:        Mood and Affect: Mood and affect and mood normal.        Behavior: Behavior normal.       Imaging: DG Chest 2 View  Result Date: 11/06/2020 CLINICAL DATA:  Chest pain EXAM: CHEST - 2 VIEW COMPARISON:  03/18/2018 FINDINGS: The heart size and mediastinal contours are within normal limits. Low lung volumes with mild streaky bibasilar opacities. No pleural effusion or pneumothorax. The visualized skeletal structures are unremarkable. IMPRESSION: Low lung volumes with mild streaky bibasilar opacities, likely atelectasis. A superimposed infectious process would be difficult to exclude. Electronically Signed   By: Davina Poke D.O.   On: 11/06/2020 10:03   CT Angio Chest PE W and/or Wo Contrast  Result Date: 11/06/2020 CLINICAL DATA:  80 year old male with concern for pulmonary embolism. EXAM: CT ANGIOGRAPHY CHEST WITH CONTRAST TECHNIQUE: Multidetector CT imaging of the chest was performed using the standard protocol during bolus administration of intravenous contrast. Multiplanar CT image reconstructions and MIPs were obtained to evaluate the vascular anatomy. CONTRAST:  16mL OMNIPAQUE IOHEXOL 350 MG/ML SOLN COMPARISON:  Chest CT dated 11/06/2020. FINDINGS: Cardiovascular: There is no cardiomegaly or pericardial effusion. Mild atherosclerotic calcification of the thoracic aorta. There is a small pulmonary artery involving the right upper lobe lobar branch point. Faint linear density in the right lower lobe branches likely represent scarring sequela of prior  PE. No CT evidence of right heart straining. Mediastinum/Nodes: No hilar or mediastinal adenopathy. The esophagus the thyroid gland are grossly unremarkable. No mediastinal fluid collection. Lungs/Pleura: Bibasilar patchy and streaky densities may represent atelectasis but concerning for pneumonia. Clinical correlation is recommended. There is a small pleural effusion. No pneumothorax. The central airways are patent. Upper Abdomen: Probable fatty liver. Indeterminate nodular density in the left upper abdomen, possibly splenic tissue. This was present on the prior CT of 2008. Musculoskeletal: No chest wall abnormality. No acute or significant osseous findings. Review of the MIP images confirms the above findings. IMPRESSION: 1. Small right upper lobe lobar pulmonary artery embolus. No CT evidence of right heart straining. 2. Bibasilar patchy and streaky densities may represent atelectasis but concerning for pneumonia. Clinical correlation is recommended. 3. Small right pleural effusion. 4. Aortic Atherosclerosis (ICD10-I70.0). These results were called by telephone at the  time of interpretation on 11/06/2020 at 6:28 pm to provider [facility] , who verbally acknowledged these results. Electronically Signed   By: Anner Crete M.D.   On: 11/06/2020 18:43   ECHOCARDIOGRAM COMPLETE  Result Date: 11/07/2020    ECHOCARDIOGRAM REPORT   Patient Name:   Kenneth Roberts Date of Exam: 11/07/2020 Medical Rec #:  [id number]       Height:       71.0 in Accession #:    [id number]      Weight:       189.9 lb Date of Birth:  [birth year]       BSA:          2.063 m Patient Age:    12 years        BP:           120/87 mmHg Patient Gender: M               HR:           80 bpm. Exam Location:  Inpatient Procedure: 2D Echo, 3D Echo, Color Doppler, Cardiac Doppler and Strain Analysis Indications:    Chest Pain R07.9  History:        Patient has no prior history of Echocardiogram examinations.                 Right pulmonary embolism.  Pneumonia.  Sonographer:    Darlina Sicilian RDCS Referring Phys: [phone] AMY N [facility] IMPRESSIONS  1. Left ventricular ejection fraction, by estimation, is 60 to 65%. The left ventricle has normal function. The left ventricle has no regional wall motion abnormalities. There is mild concentric left ventricular hypertrophy. Left ventricular diastolic parameters are consistent with Grade I diastolic dysfunction (impaired relaxation). The average left ventricular global longitudinal strain is -18.6 %. The global longitudinal strain is normal.  2. Right ventricular systolic function is normal. The right ventricular size is normal.  3. The mitral valve is normal in structure. No evidence of mitral valve regurgitation. No evidence of mitral stenosis.  4. The aortic valve is normal in structure. Aortic valve regurgitation is not visualized. No aortic stenosis is present.  5. The inferior vena cava is normal in size with greater than 50% respiratory variability, suggesting right atrial pressure of 3 mmHg. FINDINGS  Left Ventricle: Left ventricular ejection fraction, by estimation, is 60 to 65%. The left ventricle has normal function. The left ventricle has no regional wall motion abnormalities. The average left ventricular global longitudinal strain is -18.6 %. The global longitudinal strain is normal. The left ventricular internal cavity size was normal in size. There is mild concentric left ventricular hypertrophy. Left ventricular diastolic parameters are consistent with Grade I diastolic dysfunction (impaired relaxation). Normal left ventricular filling pressure. Right Ventricle: The right ventricular size is normal. No increase in right ventricular wall thickness. Right ventricular systolic function is normal. Left Atrium: Left atrial size was normal in size. Right Atrium: Right atrial size was normal in size. Pericardium: There is no evidence of pericardial effusion. Mitral Valve: The mitral valve is normal in structure.  No evidence of mitral valve regurgitation. No evidence of mitral valve stenosis. Tricuspid Valve: The tricuspid valve is normal in structure. Tricuspid valve regurgitation is not demonstrated. No evidence of tricuspid stenosis. Aortic Valve: The aortic valve is normal in structure. Aortic valve regurgitation is not visualized. No aortic stenosis is present. Pulmonic Valve: The pulmonic valve was normal in structure. Pulmonic valve regurgitation is trivial. No evidence of pulmonic  stenosis. Aorta: The aortic root is normal in size and structure. Venous: The inferior vena cava is normal in size with greater than 50% respiratory variability, suggesting right atrial pressure of 3 mmHg. IAS/Shunts: No atrial level shunt detected by color flow Doppler.  LEFT VENTRICLE PLAX 2D LVIDd:         3.70 cm  Diastology LVIDs:         2.70 cm  LV e' medial:    4.62 cm/s LV PW:         1.20 cm  LV E/e' medial:  13.1 LV IVS:        1.30 cm  LV e' lateral:   6.65 cm/s LVOT diam:     2.00 cm  LV E/e' lateral: 9.1 LV SV:         60 LV SV Index:   29       2D Longitudinal Strain LVOT Area:     3.14 cm 2D Strain GLS (A2C):   -20.3 %                         2D Strain GLS (A3C):   -17.0 %                         2D Strain GLS (A4C):   -18.4 %                         2D Strain GLS Avg:     -18.6 %                          3D Volume EF:                         3D EF:        51 %                         LV EDV:       121 ml                         LV ESV:       59 ml                         LV SV:        62 ml RIGHT VENTRICLE RV S prime:     16.50 cm/s TAPSE (M-mode): 2.2 cm LEFT ATRIUM             Index       RIGHT ATRIUM           Index LA diam:        2.80 cm 1.36 cm/m  RA Area:     12.50 cm LA Vol (A2C):   31.6 ml 15.32 ml/m RA Volume:   26.00 ml  12.61 ml/m LA Vol (A4C):   40.3 ml 19.54 ml/m LA Biplane Vol: 37.2 ml 18.04 ml/m  AORTIC VALVE LVOT Vmax:   89.40 cm/s LVOT Vmean:  59.700 cm/s LVOT VTI:    0.191 m  AORTA Ao Root diam:  2.90 cm Ao Asc diam:  3.20 cm MITRAL VALVE MV Area (PHT): 3.77 cm    SHUNTS MV Decel Time: 201 msec    Systemic VTI:  0.19  m MV E velocity: 60.70 cm/s  Systemic Diam: 2.00 cm MV A velocity: 73.40 cm/s MV E/A ratio:  0.83 Fransico Him MD Electronically signed by Fransico Him MD Signature Date/Time: 11/07/2020/10:56:31 AM    Final    VAS [place] LOWER EXTREMITY VENOUS (DVT)  Result Date: 11/07/2020  Lower Venous DVT Study Indications: Swelling.  Comparison Study: No previous exam Performing Technologist: Vonzell Schlatter RVT  Examination Guidelines: A complete evaluation includes B-mode imaging, spectral Doppler, color Doppler, and power Doppler as needed of all accessible portions of each vessel. Bilateral testing is considered an integral part of a complete examination. Limited examinations for reoccurring indications may be performed as noted. The reflux portion of the exam is performed with the patient in reverse Trendelenburg.  +---------+---------------+---------+-----------+----------+--------------+ RIGHT    CompressibilityPhasicitySpontaneityPropertiesThrombus Aging +---------+---------------+---------+-----------+----------+--------------+ CFV      Full           Yes      Yes                                 +---------+---------------+---------+-----------+----------+--------------+ SFJ      Full                                                        +---------+---------------+---------+-----------+----------+--------------+ FV Prox  Full                                                        +---------+---------------+---------+-----------+----------+--------------+ FV Mid   Full                                                        +---------+---------------+---------+-----------+----------+--------------+ FV DistalFull                                                        +---------+---------------+---------+-----------+----------+--------------+ PFV      Full                                                         +---------+---------------+---------+-----------+----------+--------------+ POP      Full           Yes      Yes                                 +---------+---------------+---------+-----------+----------+--------------+ PTV      Full                                                        +---------+---------------+---------+-----------+----------+--------------+  PERO     Full                                                        +---------+---------------+---------+-----------+----------+--------------+   +---------+---------------+---------+-----------+----------+--------------+ LEFT     CompressibilityPhasicitySpontaneityPropertiesThrombus Aging +---------+---------------+---------+-----------+----------+--------------+ CFV      Full           Yes      Yes                                 +---------+---------------+---------+-----------+----------+--------------+ SFJ      Full                                                        +---------+---------------+---------+-----------+----------+--------------+ FV Prox  Full                                                        +---------+---------------+---------+-----------+----------+--------------+ FV Mid   Full                                                        +---------+---------------+---------+-----------+----------+--------------+ FV DistalFull                                                        +---------+---------------+---------+-----------+----------+--------------+ PFV      Full                                                        +---------+---------------+---------+-----------+----------+--------------+ POP      Full           Yes      Yes                                 +---------+---------------+---------+-----------+----------+--------------+ PTV      Full                                                         +---------+---------------+---------+-----------+----------+--------------+ PERO     Full                                                        +---------+---------------+---------+-----------+----------+--------------+  Summary: RIGHT: - There is no evidence of deep vein thrombosis in the lower extremity.  - No cystic structure found in the popliteal fossa.  LEFT: - There is no evidence of deep vein thrombosis in the lower extremity.  - No cystic structure found in the popliteal fossa.  *See table(s) above for measurements and observations. Electronically signed by Jamelle Haring on 11/07/2020 at 12:28:16 PM.    Final      Assessment & Plan:   Community acquired pneumonia No right heart strain on CT scan or on 2D echo:  Continue Eliquis   Stay active  Deep breathing exercises  May take tylenol for fever or pain  May take mucinex twice daily     Community-acquired pneumonia:  Bibasilar infiltrates noted on CT scan with small right pleural effusion - will order repeat imaging today  Will order chest x ray:  [facility] [address], [place] [address] [phone] MON - FRI 8:00 AM - 4:00 PM - WALK IN  Essential hypertension BP is currently at goal Continue home amlodipine 5 mg daily and losartan 50 mg daily Follow-up with your PCP  BPH Continue home Proscar and Flomax Follow up wit PCP  GERD Continue home PPI, Protonix 40 mg daily Follow up with PCP  Follow up:  Follow up if needed      Fenton Foy, NP 12/04/2020

## 2020-12-04 NOTE — Patient Instructions (Addendum)
Right upper lobe pulmonary embolism No right heart strain on CT scan or on 2D echo:  Continue Eliquis   Stay active  Deep breathing exercises  May take tylenol for fever or pain  May take mucinex twice daily  Will order chest x ray:  [facility] [address], [place] [address] [phone] MON - FRI 8:00 AM - 4:00 PM - WALK IN   Community-acquired pneumonia:  Bibasilar infiltrates noted on CT scan with small right pleural effusion - will order repeat imaging today  Essential hypertension BP is currently at goal Continue home amlodipine 5 mg daily and losartan 50 mg daily Follow-up with your PCP  BPH Continue home Proscar and Flomax Follow up wit PCP  GERD Continue home PPI, Protonix 40 mg daily Follow up with PCP  Follow up:  Follow up if needed

## 2020-12-04 NOTE — Assessment & Plan Note (Signed)
No right heart strain on CT scan or on 2D echo:  Continue Eliquis   Stay active  Deep breathing exercises  May take tylenol for fever or pain  May take mucinex twice daily     Community-acquired pneumonia:  Bibasilar infiltrates noted on CT scan with small right pleural effusion - will order repeat imaging today  Will order chest x ray:  [facility] [address], [place] [address] [phone] MON - FRI 8:00 AM - 4:00 PM - WALK IN  Essential hypertension BP is currently at goal Continue home amlodipine 5 mg daily and losartan 50 mg daily Follow-up with your PCP  BPH Continue home Proscar and Flomax Follow up wit PCP  GERD Continue home PPI, Protonix 40 mg daily Follow up with PCP  Follow up:  Follow up if needed

## 2020-12-06 ENCOUNTER — Telehealth: Payer: Self-pay | Admitting: *Deleted

## 2020-12-06 NOTE — Telephone Encounter (Signed)
Pt returned [facility] call and stated he has had his covid vaccines and received his booster two weeks ago([employer])

## 2020-12-14 ENCOUNTER — Ambulatory Visit: Payer: Medicare Other | Admitting: Nurse Practitioner

## 2020-12-19 ENCOUNTER — Ambulatory Visit: Payer: Medicare Other | Admitting: Podiatry

## 2021-01-14 ENCOUNTER — Emergency Department (HOSPITAL_COMMUNITY): Payer: Medicare Other

## 2021-01-14 ENCOUNTER — Other Ambulatory Visit: Payer: Self-pay

## 2021-01-14 ENCOUNTER — Encounter (HOSPITAL_COMMUNITY): Payer: Self-pay

## 2021-01-14 ENCOUNTER — Emergency Department (HOSPITAL_COMMUNITY)
Admission: EM | Admit: 2021-01-14 | Discharge: 2021-01-14 | Disposition: A | Discharge status: Home / Self Care | Payer: Medicare Other | Source: Home / Self Care

## 2021-01-14 ENCOUNTER — Observation Stay (HOSPITAL_COMMUNITY)
Admission: EM | Admit: 2021-01-14 | Discharge: 2021-01-15 | Disposition: A | Discharge status: Home / Self Care | Payer: Medicare Other | Attending: Internal Medicine | Admitting: Internal Medicine

## 2021-01-14 DIAGNOSIS — I4581 Long QT syndrome: Secondary | ICD-10-CM | POA: Diagnosis not present

## 2021-01-14 DIAGNOSIS — E8729 Other acidosis: Secondary | ICD-10-CM | POA: Diagnosis present

## 2021-01-14 DIAGNOSIS — R1084 Generalized abdominal pain: Principal | ICD-10-CM | POA: Insufficient documentation

## 2021-01-14 DIAGNOSIS — F10939 Alcohol use, unspecified with withdrawal, unspecified: Secondary | ICD-10-CM

## 2021-01-14 DIAGNOSIS — E785 Hyperlipidemia, unspecified: Secondary | ICD-10-CM | POA: Diagnosis not present

## 2021-01-14 DIAGNOSIS — N400 Enlarged prostate without lower urinary tract symptoms: Secondary | ICD-10-CM | POA: Insufficient documentation

## 2021-01-14 DIAGNOSIS — F10239 Alcohol dependence with withdrawal, unspecified: Secondary | ICD-10-CM | POA: Insufficient documentation

## 2021-01-14 DIAGNOSIS — Z7901 Long term (current) use of anticoagulants: Secondary | ICD-10-CM | POA: Diagnosis not present

## 2021-01-14 DIAGNOSIS — Z5321 Procedure and treatment not carried out due to patient leaving prior to being seen by health care provider: Secondary | ICD-10-CM | POA: Insufficient documentation

## 2021-01-14 DIAGNOSIS — E872 Acidosis: Secondary | ICD-10-CM | POA: Insufficient documentation

## 2021-01-14 DIAGNOSIS — I10 Essential (primary) hypertension: Secondary | ICD-10-CM | POA: Insufficient documentation

## 2021-01-14 DIAGNOSIS — I2699 Other pulmonary embolism without acute cor pulmonale: Secondary | ICD-10-CM | POA: Insufficient documentation

## 2021-01-14 DIAGNOSIS — R531 Weakness: Secondary | ICD-10-CM | POA: Insufficient documentation

## 2021-01-14 DIAGNOSIS — Z87891 Personal history of nicotine dependence: Secondary | ICD-10-CM | POA: Insufficient documentation

## 2021-01-14 DIAGNOSIS — R112 Nausea with vomiting, unspecified: Secondary | ICD-10-CM | POA: Insufficient documentation

## 2021-01-14 DIAGNOSIS — R109 Unspecified abdominal pain: Secondary | ICD-10-CM | POA: Insufficient documentation

## 2021-01-14 DIAGNOSIS — Z20822 Contact with and (suspected) exposure to covid-19: Secondary | ICD-10-CM | POA: Diagnosis not present

## 2021-01-14 DIAGNOSIS — R9431 Abnormal electrocardiogram [ECG] [EKG]: Secondary | ICD-10-CM

## 2021-01-14 DIAGNOSIS — R251 Tremor, unspecified: Secondary | ICD-10-CM | POA: Insufficient documentation

## 2021-01-14 DIAGNOSIS — R7401 Elevation of levels of liver transaminase levels: Secondary | ICD-10-CM | POA: Diagnosis not present

## 2021-01-14 DIAGNOSIS — Z79899 Other long term (current) drug therapy: Secondary | ICD-10-CM | POA: Insufficient documentation

## 2021-01-14 LAB — CBC
HCT: 39.8 % (ref 39.0–52.0)
Hemoglobin: 14.2 g/dL (ref 13.0–17.0)
MCH: 31.8 pg (ref 26.0–34.0)
MCHC: 35.7 g/dL (ref 30.0–36.0)
MCV: 89.2 fL (ref 80.0–100.0)
Platelets: 120 10*3/uL — ABNORMAL LOW (ref 150–400)
RBC: 4.46 MIL/uL (ref 4.22–5.81)
RDW: 15.6 % — ABNORMAL HIGH (ref 11.5–15.5)
WBC: 5.6 10*3/uL (ref 4.0–10.5)
nRBC: 0 % (ref 0.0–0.2)

## 2021-01-14 LAB — COMPREHENSIVE METABOLIC PANEL
ALT: 47 U/L — ABNORMAL HIGH (ref 0–44)
AST: 74 U/L — ABNORMAL HIGH (ref 15–41)
Albumin: 5.1 g/dL — ABNORMAL HIGH (ref 3.5–5.0)
Alkaline Phosphatase: 42 U/L (ref 38–126)
Anion gap: 21 — ABNORMAL HIGH (ref 5–15)
BUN: 11 mg/dL (ref 8–23)
CO2: 20 mmol/L — ABNORMAL LOW (ref 22–32)
Calcium: 10.2 mg/dL (ref 8.9–10.3)
Chloride: 100 mmol/L (ref 98–111)
Creatinine, Ser: 1.07 mg/dL (ref 0.61–1.24)
GFR, Estimated: >60 mL/min (ref >60)
Glucose, Bld: 147 mg/dL — ABNORMAL HIGH (ref 70–99)
Potassium: 4.1 mmol/L (ref 3.5–5.1)
Sodium: 141 mmol/L (ref 135–145)
Total Bilirubin: 1.9 mg/dL — ABNORMAL HIGH (ref 0.3–1.2)
Total Protein: 8.7 g/dL — ABNORMAL HIGH (ref 6.5–8.1)

## 2021-01-14 LAB — URINALYSIS, ROUTINE W REFLEX MICROSCOPIC
Bacteria, UA: NONE SEEN
Bilirubin Urine: NEGATIVE
Glucose, UA: NEGATIVE mg/dL
Hgb urine dipstick: NEGATIVE
Ketones, ur: 80 mg/dL — AB
Leukocytes,Ua: NEGATIVE
Nitrite: NEGATIVE
Protein, ur: 30 mg/dL — AB
Specific Gravity, Urine: 1.015 (ref 1.005–1.030)
pH: 6 (ref 5.0–8.0)

## 2021-01-14 LAB — RAPID URINE DRUG SCREEN, HOSP PERFORMED
Amphetamines: NOT DETECTED
Barbiturates: NOT DETECTED
Benzodiazepines: NOT DETECTED
Cocaine: NOT DETECTED
Opiates: NOT DETECTED
Tetrahydrocannabinol: NOT DETECTED

## 2021-01-14 LAB — ETHANOL: Alcohol, Ethyl (B): <10 mg/dL (ref <10)

## 2021-01-14 LAB — LIPASE, BLOOD: Lipase: 40 U/L (ref 11–51)

## 2021-01-14 LAB — CBG MONITORING, ED: Glucose-Capillary: 139 mg/dL — ABNORMAL HIGH (ref 70–99)

## 2021-01-14 MED ORDER — IOHEXOL 300 MG/ML  SOLN
100.0000 mL | Freq: Once | INTRAMUSCULAR | Status: AC | PRN
  Administered 2021-01-14: 100 mL via INTRAVENOUS

## 2021-01-14 MED ORDER — LORAZEPAM 2 MG/ML IJ SOLN
1.0000 mg | Freq: Once | INTRAMUSCULAR | Status: AC
  Administered 2021-01-14: 1 mg via INTRAVENOUS
  Filled 2021-01-14: qty 1

## 2021-01-14 MED ORDER — ONDANSETRON 4 MG PO TBDP
4.0000 mg | ORAL_TABLET | Freq: Once | ORAL | Status: AC | PRN
  Administered 2021-01-14: 4 mg via ORAL
  Filled 2021-01-14: qty 1

## 2021-01-14 NOTE — ED Triage Notes (Signed)
Pt to ED by POV from home with c/o an aggressive tremor which he states he woke up with this morning, no hx of the same. Also reports  Abdominal pain and "heaviness of the feet". VS difficult to assess due to tremor. Arrives A+O, brought back to room with MD at bedside.

## 2021-01-14 NOTE — ED Triage Notes (Signed)
Per EMS- patient walked out of [facility] calling for an ambulance. patient c/o abdominal pain and N/V since this AM. patient does have tremors.

## 2021-01-14 NOTE — ED Provider Notes (Signed)
[facility] DEPT Provider Note   CSN: [id number] Arrival date & time: 01/14/21  1947     History Chief Complaint  Patient presents with  . Tremors    Kenneth Roberts is a 80 y.o. male.  Presented to ER with concern for abdominal pain, tremors.  Patient reports that since this morning he has been having constant lower abdominal pain as well as some associated nausea and vomiting.  Nonbloody nonbilious.  Also states that this afternoon he started having tremors in his arms and legs.  States this feels uncontrollable.  Also having a headache.  Moderate, not sudden onset.  Denies prior history of seizures.  States he occasionally uses alcohol.  Per review of chart, recent admission in January 2022, found to have right upper lobe pulmonary embolism, started on Eliquis, also with community-acquired pneumonia and completed course of antibiotics.  HPI     Past Medical History:  Diagnosis Date  . Adenomatous colon polyp 01/2009  . Anemia   . BPH (benign prostatic hypertrophy)   . Cataract   . Chronic headaches   . Colon polyps   . Fatty liver   . GERD (gastroesophageal reflux disease)   . Heartburn   . Hepatic hemangioma   . Hiatal hernia   . Hypertension   . Internal hemorrhoids   . Leukopenia     Patient Active Problem List   Diagnosis Date Noted  . Acute pulmonary embolism ([place]) 11/07/2020  . Pleuritic chest pain 11/06/2020  . Pulmonary embolism on right ([place]) 11/06/2020  . Community acquired pneumonia 11/06/2020  . Hiatal hernia 12/25/2016  . Routine general medical examination at a health care facility 12/22/2015  . Precordial chest pain 12/20/2015  . 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 Lumbago 01/30/2014  . HLD (hyperlipidemia) 07/21/2013  . Hemorrhoids 04/14/2012  . BPH (benign prostatic hyperplasia) 04/14/2012  . PERSONAL HX COLONIC POLYPS 01/20/2011  . Essential hypertension 04/27/2007  . GERD 04/27/2007    Past Surgical  History:  Procedure Laterality Date  . CATARACT EXTRACTION     Left eye  . COLONOSCOPY         Family History  Problem Relation Age of Onset  . Stomach cancer Mother   . Diabetes Father   . Diabetes Brother   . Diabetes Sister   . Colon cancer Neg Hx   . Colon polyps Neg Hx   . Esophageal cancer Neg Hx     Social History   Tobacco Use  . Smoking status: Former Smoker    Quit date: 02/16/1981    Years since quitting: 39.9  . Smokeless tobacco: Never Used  Vaping Use  . Vaping Use: Never used  Substance Use Topics  . Alcohol use: Yes    Alcohol/week: 0.0 standard drinks  . Drug use: No    Home Medications Prior to Admission medications   Medication Sig Start Date End Date Taking? Authorizing Provider  apixaban (ELIQUIS) 5 MG TABS tablet Take 5 mg by mouth 2 (two) times daily.   Yes [provider]  clotrimazole-betamethasone (LOTRISONE) cream Apply 1 application topically 2 (two) times daily. 01/05/21  Yes [provider]  dorzolamide-timolol (COSOPT) 22.3-6.8 MG/ML ophthalmic solution Place 1 drop into both eyes 2 (two) times daily. 11/28/19  Yes [provider]  finasteride (PROSCAR) 5 MG tablet Take 1 tablet (5 mg total) by mouth daily. 11/08/20 02/06/21 Yes Kayleen Memos, DO  hydrOXYzine (ATARAX/VISTARIL) 25 MG tablet Take 25 mg by mouth 2 (two)  times daily. 10/29/20  Yes [provider]  losartan (COZAAR) 50 MG tablet Take 1 tablet (50 mg total) by mouth daily. 11/09/20 02/07/21 Yes Hall, Carole N, DO  pantoprazole (PROTONIX) 40 MG tablet Take 40 mg by mouth daily. 03/09/20  Yes [provider]  amLODipine (NORVASC) 5 MG tablet Take 1 tablet (5 mg total) by mouth daily. Patient not taking: No sig reported 11/08/20 02/06/21  Kayleen Memos, DO  APIXABAN Arne [place]) VTE STARTER PACK (10MG  AND 5MG ) Take as directed on package: start with two-5mg  tablets twice daily for 7 days. On day 8, switch to one-5mg  tablet twice daily. Patient not taking:  No sig reported 11/08/20   Kayleen Memos, DO  tamsulosin (FLOMAX) 0.4 MG CAPS capsule Take 1 capsule (0.4 mg total) by mouth daily. Patient not taking: No sig reported 11/08/20 02/06/21  Kayleen Memos, DO    Allergies    Gadolinium derivatives and Prilosec [omeprazole]  Review of Systems   Review of Systems  Constitutional: Positive for fatigue. Negative for chills and fever.  HENT: Negative for ear pain and sore throat.   Eyes: Negative for pain and visual disturbance.  Respiratory: Negative for cough and shortness of breath.   Cardiovascular: Negative for chest pain and palpitations.  Gastrointestinal: Positive for abdominal pain. Negative for vomiting.  Genitourinary: Negative for dysuria and hematuria.  Musculoskeletal: Negative for arthralgias and back pain.  Skin: Negative for color change and rash.  Neurological: Positive for tremors. Negative for seizures and syncope.  All other systems reviewed and are negative.   Physical Exam Updated Vital Signs BP (!) 194/119   Pulse 95   Temp 97.9 F (36.6 C) (Oral)   Resp 20   Ht 5\' 10"  (1.778 m)   Wt 81.6 kg   SpO2 100%   BMI 25.81 kg/m   Physical Exam Vitals and nursing note reviewed.  Constitutional:      Appearance: He is well-developed.     Comments: Shaking in upper and lower extremities but alert and making eye contact and following commands  HENT:     Head: Normocephalic and atraumatic.  Eyes:     Conjunctiva/sclera: Conjunctivae normal.  Cardiovascular:     Rate and Rhythm: Normal rate and regular rhythm.     Heart sounds: No murmur heard.   Pulmonary:     Effort: Pulmonary effort is normal. No respiratory distress.     Breath sounds: Normal breath sounds.  Abdominal:     Palpations: Abdomen is soft.     Tenderness: There is no abdominal tenderness.  Musculoskeletal:     Cervical back: Neck supple.  Skin:    General: Skin is warm and dry.  Neurological:     Mental Status: He is alert.     Comments:  Alert, oriented x3, speech is clear, he is shaking in his upper and lower extremities, 5 out of 5 strength in upper lower extremities, pupils are equal round and reactive, extraocular movement is intact     ED Results / Procedures / Treatments   Labs (all labs ordered are listed, but only abnormal results are displayed) Labs Reviewed  CBG MONITORING, ED - Abnormal; Notable for the following components:      Result Value   Glucose-Capillary 139 (*)    All other components within normal limits  ETHANOL  RAPID URINE DRUG SCREEN, HOSP PERFORMED  CBC WITH DIFFERENTIAL/PLATELET  COMPREHENSIVE METABOLIC PANEL  LIPASE, BLOOD  URINALYSIS, ROUTINE W REFLEX MICROSCOPIC  EKG EKG Interpretation  Date/Time:  Monday January 14 2021 20:38:27 EDT Ventricular Rate:  86 PR Interval:    QRS Duration: 91 QT Interval:  460 QTC Calculation: 551 R Axis:   49 Text Interpretation: Sinus rhythm Left ventricular hypertrophy Prolonged QT interval Confirmed by Madalyn Rob [id number]) on 01/14/2021 8:47:59 PM   Radiology CT Head Wo Contrast  Result Date: 01/14/2021 CLINICAL DATA:  Subarachnoid hemorrhage [facility]) suspected Awoke with tremor. EXAM: CT HEAD WITHOUT CONTRAST TECHNIQUE: Contiguous axial images were obtained from the base of the skull through the vertex without intravenous contrast. COMPARISON:  Most recent head CT 11/25/2005, brain MRI 07/17/2017 FINDINGS: Brain: No evidence of acute infarction, hemorrhage, hydrocephalus, extra-axial collection or mass lesion/mass effect. Stable degree of atrophy and chronic small vessel ischemia. Vascular: No hyperdense vessel. Skull: No fracture or focal lesion. Sinuses/Orbits: No acute findings allowing for motion. Paranasal sinuses and mastoid air cells are clear. Other: Mild motion artifact limitations. IMPRESSION: 1. No acute intracranial abnormality. 2. Grossly stable atrophy and chronic small vessel ischemia from 2018 MRI. Electronically Signed   By: Keith Rake M.D.   On: 01/14/2021 22:50   CT ABDOMEN PELVIS W CONTRAST  Result Date: 01/14/2021 CLINICAL DATA:  Abdominal pain, acute, nonlocalized abd pain, LLQ and RLQ EXAM: CT ABDOMEN AND PELVIS WITH CONTRAST TECHNIQUE: Multidetector CT imaging of the abdomen and pelvis was performed using the standard protocol following bolus administration of intravenous contrast. CONTRAST:  159mL OMNIPAQUE IOHEXOL 300 MG/ML  SOLN COMPARISON:  CT abdomen pelvis 10/03/2019, CT angio chest 11/06/2020, CT abdomen pelvis 09/01/2003 report without images, CT abdomen pelvis 09/03/2006 FINDINGS: Lower chest: Thin walled cystic lesion at the right base likely benign in etiology. Linear atelectasis or scarring at bilateral bases. No acute abnormality. Hepatobiliary: The hepatic parenchyma is diffusely hypodense compared to the splenic parenchyma consistent with fatty infiltration. Similar-appearing 1.9 x 1.3 cm heterogeneous hyperdense lesion within the left hepatic lobe (2:12). No gallstones, gallbladder wall thickening, or pericholecystic fluid. No biliary dilatation. Pancreas: No focal lesion. Normal pancreatic contour. No surrounding inflammatory changes. No main pancreatic ductal dilatation. Spleen: Normal in size without focal abnormality. Adrenals/Urinary Tract: No adrenal nodule bilaterally. Bilateral kidneys enhance symmetrically. No hydronephrosis. No hydroureter. The urinary bladder is unremarkable. Stomach/Bowel: Stomach is within normal limits. No evidence of bowel wall thickening or dilatation. Appendix appears normal. Vascular/Lymphatic: No abdominal aorta or iliac aneurysm. No abdominal, pelvic, or inguinal lymphadenopathy. Reproductive: Prostate is unremarkable. Other: No intraperitoneal free fluid. No intraperitoneal free gas. No organized fluid collection. Musculoskeletal: No abdominal wall hernia or abnormality. No suspicious lytic or blastic osseous lesions. No acute displaced fracture. Multilevel degenerative  changes of the spine. IMPRESSION: 1. No acute intra-abdominal or intrapelvic abnormality. 2. Similar-appearing 1.9 cm hypodense lesion within left hepatic lobe consistent with a known hepatic hemangioma. Electronically Signed   By: Iven Finn M.D.   On: 01/14/2021 23:04    Procedures Procedures   Medications Ordered in ED Medications  LORazepam (ATIVAN) injection 1 mg (1 mg Intravenous Given 01/14/21 2048)  iohexol (OMNIPAQUE) 300 MG/ML solution 100 mL (100 mLs Intravenous Contrast Given 01/14/21 2212)    ED Course  I have reviewed the triage vital signs and the nursing notes.  Pertinent labs & imaging results that were available during my care of the patient were reviewed by me and considered in my medical decision making (see chart for details).    MDM Rules/Calculators/A&P  80 year old male presents to ER with concern for abdominal pain, tremors.  On exam patient initially appeared uncomfortable, trembling in upper and lower legs.  He was alert, following commands.  Doubt seizure activity.  Suspect anxiety.  Resolved with Ativan.  Obtain labs, CT imaging to further assess.  While awaiting labs, signed out to Dr. Betsey Holiday.  If within normal limits, anticipate discharge.  Final Clinical Impression(s) / ED Diagnoses Final diagnoses:  Generalized abdominal pain  Tremor    Rx / DC Orders ED Discharge Orders    None       Lucrezia Starch, MD 01/14/21 2350

## 2021-01-15 DIAGNOSIS — R9431 Abnormal electrocardiogram [ECG] [EKG]: Secondary | ICD-10-CM

## 2021-01-15 DIAGNOSIS — E8729 Other acidosis: Secondary | ICD-10-CM | POA: Diagnosis present

## 2021-01-15 DIAGNOSIS — E872 Acidosis: Secondary | ICD-10-CM | POA: Diagnosis not present

## 2021-01-15 DIAGNOSIS — F10239 Alcohol dependence with withdrawal, unspecified: Secondary | ICD-10-CM

## 2021-01-15 DIAGNOSIS — R1084 Generalized abdominal pain: Secondary | ICD-10-CM | POA: Diagnosis not present

## 2021-01-15 DIAGNOSIS — F10939 Alcohol use, unspecified with withdrawal, unspecified: Secondary | ICD-10-CM

## 2021-01-15 LAB — COMPREHENSIVE METABOLIC PANEL
ALT: 35 U/L (ref 0–44)
AST: 46 U/L — ABNORMAL HIGH (ref 15–41)
Albumin: 3.8 g/dL (ref 3.5–5.0)
Alkaline Phosphatase: 31 U/L — ABNORMAL LOW (ref 38–126)
Anion gap: 10 (ref 5–15)
BUN: 12 mg/dL (ref 8–23)
CO2: 25 mmol/L (ref 22–32)
Calcium: 9.1 mg/dL (ref 8.9–10.3)
Chloride: 103 mmol/L (ref 98–111)
Creatinine, Ser: 0.98 mg/dL (ref 0.61–1.24)
GFR, Estimated: >60 mL/min (ref >60)
Glucose, Bld: 98 mg/dL (ref 70–99)
Potassium: 3.6 mmol/L (ref 3.5–5.1)
Sodium: 138 mmol/L (ref 135–145)
Total Bilirubin: 1.3 mg/dL — ABNORMAL HIGH (ref 0.3–1.2)
Total Protein: 7.2 g/dL (ref 6.5–8.1)

## 2021-01-15 LAB — BASIC METABOLIC PANEL
Anion gap: 14 (ref 5–15)
BUN: 12 mg/dL (ref 8–23)
CO2: 23 mmol/L (ref 22–32)
Calcium: 8.9 mg/dL (ref 8.9–10.3)
Chloride: 101 mmol/L (ref 98–111)
Creatinine, Ser: 1.02 mg/dL (ref 0.61–1.24)
GFR, Estimated: >60 mL/min (ref >60)
Glucose, Bld: 88 mg/dL (ref 70–99)
Potassium: 3.6 mmol/L (ref 3.5–5.1)
Sodium: 138 mmol/L (ref 135–145)

## 2021-01-15 LAB — BETA-HYDROXYBUTYRIC ACID
Beta-Hydroxybutyric Acid: 3.36 mmol/L — ABNORMAL HIGH (ref 0.05–0.27)
Beta-Hydroxybutyric Acid: 0.87 mmol/L — ABNORMAL HIGH (ref 0.05–0.27)

## 2021-01-15 LAB — RESP PANEL BY RT-PCR (FLU A&B, COVID) ARPGX2
Influenza A by PCR: NEGATIVE
Influenza B by PCR: NEGATIVE
SARS Coronavirus 2 by RT PCR: NEGATIVE

## 2021-01-15 LAB — MAGNESIUM: Magnesium: 1.8 mg/dL (ref 1.7–2.4)

## 2021-01-15 LAB — PHOSPHORUS: Phosphorus: 2.6 mg/dL (ref 2.5–4.6)

## 2021-01-15 LAB — SALICYLATE LEVEL: Salicylate Lvl: <7 mg/dL — ABNORMAL LOW (ref 7.0–30.0)

## 2021-01-15 LAB — LACTIC ACID, PLASMA: Lactic Acid, Venous: 1.3 mmol/L (ref 0.5–1.9)

## 2021-01-15 MED ORDER — PANTOPRAZOLE SODIUM 40 MG PO TBEC
40.0000 mg | DELAYED_RELEASE_TABLET | Freq: Every day | ORAL | Status: DC
Start: 2021-01-15 — End: 2021-01-15
  Administered 2021-01-15: 40 mg via ORAL
  Filled 2021-01-15: qty 1

## 2021-01-15 MED ORDER — POTASSIUM CHLORIDE CRYS ER 20 MEQ PO TBCR
40.0000 meq | EXTENDED_RELEASE_TABLET | Freq: Once | ORAL | Status: AC
  Administered 2021-01-15: 40 meq via ORAL
  Filled 2021-01-15: qty 2

## 2021-01-15 MED ORDER — POLYETHYLENE GLYCOL 3350 17 G PO PACK: 17.0000 g | PACK | Freq: Every day | ORAL | Status: DC | PRN

## 2021-01-15 MED ORDER — LORAZEPAM 1 MG PO TABS: 0.0000 mg | ORAL_TABLET | Freq: Two times a day (BID) | ORAL | Status: DC

## 2021-01-15 MED ORDER — SODIUM CHLORIDE 0.9 % IV BOLUS
1000.0000 mL | Freq: Once | INTRAVENOUS | Status: AC
  Administered 2021-01-15: 1000 mL via INTRAVENOUS

## 2021-01-15 MED ORDER — LORAZEPAM 2 MG/ML IJ SOLN: 0.0000 mg | Freq: Two times a day (BID) | INTRAMUSCULAR | Status: DC

## 2021-01-15 MED ORDER — HYDROXYZINE HCL 25 MG PO TABS: 25.0000 mg | ORAL_TABLET | Freq: Two times a day (BID) | ORAL | Status: DC

## 2021-01-15 MED ORDER — LORAZEPAM 2 MG/ML IJ SOLN
0.0000 mg | Freq: Four times a day (QID) | INTRAMUSCULAR | Status: DC
  Administered 2021-01-15: 1 mg via INTRAVENOUS
  Filled 2021-01-15: qty 1

## 2021-01-15 MED ORDER — ACETAMINOPHEN 325 MG PO TABS: 650.0000 mg | ORAL_TABLET | Freq: Four times a day (QID) | ORAL | Status: DC | PRN

## 2021-01-15 MED ORDER — LOSARTAN POTASSIUM 25 MG PO TABS
50.0000 mg | ORAL_TABLET | Freq: Every day | ORAL | Status: DC
  Administered 2021-01-15: 50 mg via ORAL
  Filled 2021-01-15: qty 2

## 2021-01-15 MED ORDER — APIXABAN 5 MG PO TABS
5.0000 mg | ORAL_TABLET | Freq: Two times a day (BID) | ORAL | Status: DC
  Administered 2021-01-15: 5 mg via ORAL
  Filled 2021-01-15: qty 1

## 2021-01-15 MED ORDER — MORPHINE SULFATE (PF) 2 MG/ML IV SOLN: 2.0000 mg | INTRAVENOUS | Status: DC | PRN

## 2021-01-15 MED ORDER — ACETAMINOPHEN 650 MG RE SUPP: 650.0000 mg | Freq: Four times a day (QID) | RECTAL | Status: DC | PRN

## 2021-01-15 MED ORDER — HYDROCODONE-ACETAMINOPHEN 5-325 MG PO TABS: 1.0000 | ORAL_TABLET | ORAL | Status: DC | PRN

## 2021-01-15 MED ORDER — FINASTERIDE 5 MG PO TABS
5.0000 mg | ORAL_TABLET | Freq: Every day | ORAL | Status: DC
  Administered 2021-01-15: 5 mg via ORAL
  Filled 2021-01-15: qty 1

## 2021-01-15 MED ORDER — THIAMINE HCL 100 MG/ML IJ SOLN
100.0000 mg | Freq: Once | INTRAMUSCULAR | Status: AC
  Administered 2021-01-15: 100 mg via INTRAVENOUS
  Filled 2021-01-15: qty 2

## 2021-01-15 MED ORDER — MAGNESIUM SULFATE 2 GM/50ML IV SOLN
2.0000 g | Freq: Once | INTRAVENOUS | Status: AC
  Administered 2021-01-15: 2 g via INTRAVENOUS
  Filled 2021-01-15: qty 50

## 2021-01-15 MED ORDER — LORAZEPAM 1 MG PO TABS
0.0000 mg | ORAL_TABLET | Freq: Four times a day (QID) | ORAL | Status: DC
  Administered 2021-01-15: 1 mg via ORAL
  Filled 2021-01-15: qty 1

## 2021-01-15 MED ORDER — METOPROLOL TARTRATE 5 MG/5ML IV SOLN: 5.0000 mg | Freq: Four times a day (QID) | INTRAVENOUS | Status: DC | PRN

## 2021-01-15 NOTE — Discharge Instructions (Signed)
Abdominal Pain, Adult Many things can cause belly (abdominal) pain. Most times, belly pain is not dangerous. Many cases of belly pain can be watched and treated at home. Sometimes, though, belly pain is serious. Your doctor will try to find the cause of your belly pain. Follow these instructions at home: Medicines  Take over-the-counter and prescription medicines only as told by your doctor.  Do not take medicines that help you poop (laxatives) unless told by your doctor. General instructions  Watch your belly pain for any changes.  Drink enough fluid to keep your pee (urine) pale yellow.  Keep all follow-up visits as told by your doctor. This is important.   Contact a doctor if:  Your belly pain changes or gets worse.  You are not hungry, or you lose weight without trying.  You are having trouble pooping (constipated) or have watery poop (diarrhea) for more than 2-3 days.  You have pain when you pee or poop.  Your belly pain wakes you up at night.  Your pain gets worse with meals, after eating, or with certain foods.  You are vomiting and cannot keep anything down.  You have a fever.  You have blood in your pee. Get help right away if:  Your pain does not go away as soon as your doctor says it should.  You cannot stop vomiting.  Your pain is only in areas of your belly, such as the right side or the left lower part of the belly.  You have bloody or black poop, or poop that looks like tar.  You have very bad pain, cramping, or bloating in your belly.  You have signs of not having enough fluid or water in your body (dehydration), such as: ? Dark pee, very little pee, or no pee. ? Cracked lips. ? Dry mouth. ? Sunken eyes. ? Sleepiness. ? Weakness.  You have trouble breathing or chest pain. Summary  Many cases of belly pain can be watched and treated at home.  Watch your belly pain for any changes.  Take over-the-counter and prescription medicines only as  told by your doctor.  Contact a doctor if your belly pain changes or gets worse.  Get help right away if you have very bad pain, cramping, or bloating in your belly. This information is not intended to replace advice given to you by your health care provider. Make sure you discuss any questions you have with your health care provider. Document Revised: 02/28/2019 Document Reviewed: 02/28/2019 Elsevier Patient Education  2021 [employer].   Essential Tremor A tremor is trembling or shaking that a person cannot control. Most tremors affect the hands or arms. Tremors can also affect the head, vocal cords, legs, and other parts of the body. Essential tremor is a tremor without a known cause. Usually, it occurs while a person is trying to perform an action. It tends to get worse gradually as a person ages. What are the causes? The cause of this condition is not known. What increases the risk? You are more likely to develop this condition if:  You have a family member with essential tremor.  You are age 2 or older.  You take certain medicines. What are the signs or symptoms? The main sign of a tremor is a rhythmic shaking of certain parts of your body that is uncontrolled and unintentional. You may:  Have difficulty eating with a spoon or fork.  Have difficulty writing.  Nod your head up and down or side to  side.  Have a quivering voice. The shaking may:  Get worse over time.  Come and go.  Be more noticeable on one side of your body.  Get worse due to stress, fatigue, caffeine, and extreme heat or cold. How is this diagnosed? This condition may be diagnosed based on:  Your symptoms and medical history.  A physical exam. There is no single test to diagnose an essential tremor. However, your health care provider may order tests to rule out other causes of your condition. These may include:  Blood and urine tests.  Imaging studies of your brain, such as CT scan and  MRI.  A test that measures involuntary muscle movement (electromyogram).   How is this treated? Treatment for essential tremor depends on the severity of the condition.  Some tremors may go away without treatment.  Mild tremors may not need treatment if they do not affect your day-to-day life.  Severe tremors may need to be treated using one or more of the following options: ? Medicines. ? Lifestyle changes. ? Occupational or physical therapy. Follow these instructions at home: Lifestyle  Do not use any products that contain nicotine or tobacco, such as cigarettes and e-cigarettes. If you need help quitting, ask your health care provider.  Limit your caffeine intake as told by your health care provider.  Try to get 8 hours of sleep each night.  Find ways to manage your stress that fits your lifestyle and personality. Consider trying meditation or yoga.  Try to anticipate stressful situations and allow extra time to manage them.  If you are struggling emotionally with the effects of your tremor, consider working with a mental health provider.   General instructions  Take over-the-counter and prescription medicines only as told by your health care provider.  Avoid extreme heat and extreme cold.  Keep all follow-up visits as told by your health care provider. This is important. Visits may include physical therapy visits. Contact a health care provider if:  You experience any changes in the location or intensity of your tremors.  You start having a tremor after starting a new medicine.  You have tremor with other symptoms, such as: ? Numbness. ? Tingling. ? Pain. ? Weakness.  Your tremor gets worse.  Your tremor interferes with your daily life.  You feel down, blue, or sad for at least 2 weeks in a row.  Worrying about your tremor and what other people think about you interferes with your everyday life functions, including relationships, work, or  school. Summary  Essential tremor is a tremor without a known cause. Usually, it occurs when you are trying to perform an action.  You are more likely to develop this condition if you have a family member with essential tremor.  The main sign of a tremor is a rhythmic shaking of certain parts of your body that is uncontrolled and unintentional.  Treatment for essential tremor depends on the severity of the condition. This information is not intended to replace advice given to you by your health care provider. Make sure you discuss any questions you have with your health care provider. Document Revised: 07/13/2020 Document Reviewed: 07/13/2020 Elsevier Patient Education  2021 [employer].

## 2021-01-15 NOTE — Discharge Summary (Signed)
Physician Discharge Summary  Kenneth Roberts FYB:[id number] DOB: [birth year]   PCP: Pcp, No  Admit date: 01/14/2021 Discharge date: 01/15/2021 Length of Stay: 0 days   Code Status: Full Code  Admitted From:  Home Discharged to:   [place]:  None  Equipment/Devices:  None Discharge Condition:  Stable  Recommendations for Outpatient Follow-up   1. Encourage alcohol cessation  Hospital Summary   This is a 80 year old male with past medical history of GERD, hypertension, alcohol use, PE on Eliquis, BPH who presented to the ED with abdominal pain and tremors since morning of 3/14.  Associated with nonbloody nausea and vomiting.  Also with constant tremors in arms and legs which she felt were uncontrollable.  No history of seizures.  Upon arrival patient was alert and following commands and was given Ativan for suspected anxiety and his tremors had resolved.  Initially without known lab abnormalities since there was a delay in obtaining labs due to laboratory problems.  However, initial labs came back with anion gap acidosis which was treated with IV fluids.  Upon recheck lactic acid and beta hydroxybutyrate were drawn and anion gap had essentially closed with normal lactic acid however beta hydroxybutyric acid was moderately elevated and upon further questioning by ED physician he admitted to binge drinking over the weekend.  Currently, the patient states he feels much better but still with leg weakness.  He has not eaten anything yet and is awaiting his breakfast tray.  He denies any other complaints at this time.  ED Course: Afebrile, hypertensive, on room air.  Initial labs: Sodium 141, K4.1, CO2 20, glucose 147, BUN 11, creatinine 1.07, AG 21, alk phos 42, AST 74, ALT 47, total protein 8.7, T bili 1.9, WBC 5.6, platelets 120, UA 80 ketones, alcohol negative, beta hydroxybutyrate 3.36, COVID-19 and flu negative. notable Imaging: CT head without contrast-unremarkable.  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 Patient received Ativan, thiamine, 2 L NS bolus.   He had additional fluids, KCl, Mg, tolerated diet and had repeat lab work with near resolution of his lab abnormalities. He was also cleared by PT without any PT follow up.   Discharged in stable condition.  A & P   Principal Problem:   Alcoholic ketoacidosis Active Problems:   Essential hypertension   BPH (benign prostatic hyperplasia)   HLD (hyperlipidemia)   Pulmonary embolism on right (HCC)   Alcohol withdrawal (HCC)   Prolonged QT interval    1. Alcoholic ketoacidosis, resolved  2. Generalized tremors, likely alcohol withdrawal, resolved with ativan  3. Prolonged QT a. Electrolytes replaced b. No events on tele  4. Generalized weakness a. Likely from poor p.o. intake, alcohol withdrawal b. Cleared by PT and able to tolerate PO intake  5. Hypertension a. Continue home losartan  6. BPH a. Continue home tamsulosin and finasteride  7. History of PE a. Continue home Eliquis  8. Elevated LFTs a. Nearly resolved b. Outpatient follow up     Consultants  . none  Procedures  . none  Antibiotics   Anti-infectives (From admission, onward)   None       Subjective  Patient seen and examined at bedside this morning in no acute distress and resting comfortably.  No events overnight.  Tolerating diet and ambulating.  Denies any chest pain, shortness of breath, fever, nausea, vomiting, urinary or bowel complaints. Otherwise ROS negative    Objective   Discharge Exam:  Vitals:   01/15/21 0700 01/15/21 1006  BP: (!) 171/106 (!) 127/95  Pulse: 72 91  Resp: (!) 21 18  Temp:    SpO2: 100% 99%   Vitals:   01/15/21 0530 01/15/21 0600 01/15/21 0700 01/15/21 1006  BP: (!) 155/109 (!) 147/96 (!) 171/106 (!) 127/95  Pulse: 77 82 72 91  Resp: 15 (!) 23 (!) 21 18  Temp:      TempSrc:       SpO2: 96% 99% 100% 99%  Weight:      Height:        Physical Exam  See physical exam from this morning  The results of significant diagnostics from this hospitalization (including imaging, microbiology, ancillary and laboratory) are listed below for reference.     Microbiology: Recent Results (from the past 240 hour(s))  Resp Panel by RT-PCR (Flu A&B, Covid) Nasopharyngeal Swab     Status: None   Collection Time: 01/15/21  6:16 AM   Specimen: Nasopharyngeal Swab; Nasopharyngeal(NP) swabs in vial transport medium  Result Value Ref Range Status   SARS Coronavirus 2 by RT PCR NEGATIVE NEGATIVE Final    Comment: (NOTE) SARS-CoV-2 target nucleic acids are NOT DETECTED.  The SARS-CoV-2 RNA is generally detectable in upper respiratory specimens during the acute phase of infection. The lowest concentration of SARS-CoV-2 viral copies this assay can detect is 138 copies/mL. A negative result does not preclude SARS-Cov-2 infection and should not be used as the sole basis for treatment or other patient management decisions. A negative result may occur with  improper specimen collection/handling, submission of specimen other than nasopharyngeal swab, presence of viral mutation(s) within the areas targeted by this assay, and inadequate number of viral copies(<138 copies/mL). A negative result must be combined with clinical observations, patient history, and epidemiological information. The expected result is Negative.  Fact Sheet for Patients:  EntrepreneurPulse.com.au  Fact Sheet for Healthcare Providers:  IncredibleEmployment.be  This test is no t yet approved or cleared by the [place] FDA and  has been authorized for detection and/or diagnosis of SARS-CoV-2 by FDA under an Emergency Use Authorization (EUA). This EUA will remain  in effect (meaning this test can be used) for the duration of the COVID-19 declaration under Section 564(b)(1) of  the Act, 21 U.S.C.section 360bbb-3(b)(1), unless the authorization is terminated  or revoked sooner.       Influenza A by PCR NEGATIVE NEGATIVE Final   Influenza B by PCR NEGATIVE NEGATIVE Final    Comment: (NOTE) The Xpert Xpress SARS-CoV-2/FLU/RSV plus assay is intended as an aid in the diagnosis of influenza from Nasopharyngeal swab specimens and should not be used as a sole basis for treatment. Nasal washings and aspirates are unacceptable for Xpert Xpress SARS-CoV-2/FLU/RSV testing.  Fact Sheet for Patients: EntrepreneurPulse.com.au  Fact Sheet for Healthcare Providers: IncredibleEmployment.be  This test is not yet approved or cleared by the [place] FDA and has been authorized for detection and/or diagnosis of SARS-CoV-2 by FDA under an Emergency Use Authorization (EUA). This EUA will remain in effect (meaning this test can be used) for the duration of the COVID-19 declaration under Section 564(b)(1) of the Act, 21 U.S.C. section 360bbb-3(b)(1), unless the authorization is terminated or revoked.  Performed at [facility], [place] [address] [place],  [address]      Labs: BNP (last 3 results) No results for input(s): BNP in the last 8760 hours. Basic Metabolic Panel: Recent Labs  Lab 01/14/21 2050 01/15/21 0452 01/15/21 0457  01/15/21 1130  NA 141 138  --  138  K 4.1 3.6  --  3.6  CL 100 101  --  103  CO2 20* 23  --  25  GLUCOSE 147* 88  --  98  BUN 11 12  --  12  CREATININE 1.07 1.02  --  0.98  CALCIUM 10.2 8.9  --  9.1  MG  --   --  1.8  --   PHOS  --   --  2.6  --    Liver Function Tests: Recent Labs  Lab 01/14/21 2050 01/15/21 1130  AST 74* 46*  ALT 47* 35  ALKPHOS 42 31*  BILITOT 1.9* 1.3*  PROT 8.7* 7.2  ALBUMIN 5.1* 3.8   Recent Labs  Lab 01/14/21 2050  LIPASE 40   No results for input(s): AMMONIA in the last 168 hours. CBC: Recent Labs  Lab 01/14/21 2050  WBC 5.6   HGB 14.2  HCT 39.8  MCV 89.2  PLT 120*   Cardiac Enzymes: No results for input(s): CKTOTAL, CKMB, CKMBINDEX, TROPONINI in the last 168 hours. BNP: Invalid input(s): POCBNP CBG: Recent Labs  Lab 01/14/21 2025  GLUCAP 139*   D-Dimer No results for input(s): DDIMER in the last 72 hours. Hgb A1c No results for input(s): HGBA1C in the last 72 hours. Lipid Profile No results for input(s): CHOL, HDL, LDLCALC, TRIG, CHOLHDL, LDLDIRECT in the last 72 hours. Thyroid function studies No results for input(s): TSH, T4TOTAL, T3FREE, THYROIDAB in the last 72 hours.  Invalid input(s): FREET3 Anemia work up No results for input(s): VITAMINB12, FOLATE, FERRITIN, TIBC, IRON, RETICCTPCT in the last 72 hours. Urinalysis    Component Value Date/Time   COLORURINE YELLOW 01/14/2021 2211   APPEARANCEUR CLEAR 01/14/2021 2211   LABSPEC 1.015 01/14/2021 2211   PHURINE 6.0 01/14/2021 2211   GLUCOSEU NEGATIVE 01/14/2021 2211   GLUCOSEU NEGATIVE 12/20/2015 1047   [place] 01/14/2021 2211   BILIRUBINUR NEGATIVE 01/14/2021 2211   BILIRUBINUR NEG 03/25/2013 1442   KETONESUR 80 (A) 01/14/2021 2211   PROTEINUR 30 (A) 01/14/2021 2211   UROBILINOGEN 0.2 12/20/2015 1047   NITRITE NEGATIVE 01/14/2021 2211   LEUKOCYTESUR NEGATIVE 01/14/2021 2211   Sepsis Labs Invalid input(s): PROCALCITONIN,  WBC,  LACTICIDVEN Microbiology Recent Results (from the past 240 hour(s))  Resp Panel by RT-PCR (Flu A&B, Covid) Nasopharyngeal Swab     Status: None   Collection Time: 01/15/21  6:16 AM   Specimen: Nasopharyngeal Swab; Nasopharyngeal(NP) swabs in vial transport medium  Result Value Ref Range Status   SARS Coronavirus 2 by RT PCR NEGATIVE NEGATIVE Final    Comment: (NOTE) SARS-CoV-2 target nucleic acids are NOT DETECTED.  The SARS-CoV-2 RNA is generally detectable in upper respiratory specimens during the acute phase of infection. The lowest concentration of SARS-CoV-2 viral copies this assay can  detect is 138 copies/mL. A negative result does not preclude SARS-Cov-2 infection and should not be used as the sole basis for treatment or other patient management decisions. A negative result may occur with  improper specimen collection/handling, submission of specimen other than nasopharyngeal swab, presence of viral mutation(s) within the areas targeted by this assay, and inadequate number of viral copies(<138 copies/mL). A negative result must be combined with clinical observations, patient history, and epidemiological information. The expected result is Negative.  Fact Sheet for Patients:  EntrepreneurPulse.com.au  Fact Sheet for Healthcare Providers:  IncredibleEmployment.be  This test is no t yet approved or cleared by the [place] FDA  and  has been authorized for detection and/or diagnosis of SARS-CoV-2 by FDA under an Emergency Use Authorization (EUA). This EUA will remain  in effect (meaning this test can be used) for the duration of the COVID-19 declaration under Section 564(b)(1) of the Act, 21 U.S.C.section 360bbb-3(b)(1), unless the authorization is terminated  or revoked sooner.       Influenza A by PCR NEGATIVE NEGATIVE Final   Influenza B by PCR NEGATIVE NEGATIVE Final    Comment: (NOTE) The Xpert Xpress SARS-CoV-2/FLU/RSV plus assay is intended as an aid in the diagnosis of influenza from Nasopharyngeal swab specimens and should not be used as a sole basis for treatment. Nasal washings and aspirates are unacceptable for Xpert Xpress SARS-CoV-2/FLU/RSV testing.  Fact Sheet for Patients: BloggerCourse.com  Fact Sheet for Healthcare Providers: SeriousBroker.it  This test is not yet approved or cleared by the [place] FDA and has been authorized for detection and/or diagnosis of SARS-CoV-2 by FDA under an Emergency Use Authorization (EUA). This EUA will remain in  effect (meaning this test can be used) for the duration of the COVID-19 declaration under Section 564(b)(1) of the Act, 21 U.S.C. section 360bbb-3(b)(1), unless the authorization is terminated or revoked.  Performed at [facility], [address], [place], [place] [address]     Discharge Instructions     Discharge Instructions    Diet - low sodium heart healthy   Complete by: As directed    Discharge instructions   Complete by: As directed    You were seen in the ED for complications of alcohol called alcoholic ketoacidosis and withdrawal.   You had improvement in your symptoms, labs and were cleared by physical therapy.  Refrain from drinking alcohol as this can cause your symptoms to recur.   If you have any significant change or worsening of your symptoms do not hesitate to contact your primary care physician or return to the ED   Increase activity slowly   Complete by: As directed      Allergies as of 01/15/2021      Reactions   Gadolinium Derivatives Other (See Comments)   unknown   Prilosec [omeprazole] Itching      Medication List    STOP taking these medications   amLODipine 5 MG tablet Commonly known as: NORVASC   tamsulosin 0.4 MG Caps capsule Commonly known as: FLOMAX     TAKE these medications   apixaban 5 MG Tabs tablet Commonly known as: ELIQUIS Take 5 mg by mouth 2 (two) times daily. What changed: Another medication with the same name was removed. Continue taking this medication, and follow the directions you see here.   clotrimazole-betamethasone cream Commonly known as: LOTRISONE Apply 1 application topically 2 (two) times daily.   dorzolamide-timolol 22.3-6.8 MG/ML ophthalmic solution Commonly known as: COSOPT Place 1 drop into both eyes 2 (two) times daily.   finasteride 5 MG tablet Commonly known as: PROSCAR Take 1 tablet (5 mg total) by mouth daily.   hydrOXYzine 25 MG tablet Commonly known as:  ATARAX/VISTARIL Take 25 mg by mouth 2 (two) times daily.   losartan 50 MG tablet Commonly known as: COZAAR Take 1 tablet (50 mg total) by mouth daily.   pantoprazole 40 MG tablet Commonly known as: PROTONIX Take 40 mg by mouth daily.       Allergies  Allergen Reactions  . Gadolinium Derivatives Other (See Comments)    unknown  . Prilosec [Omeprazole] Itching     Dispo: The  patient is from: Home              Anticipated d/c is to: Home              Patient currently is medically stable to d/c.   Difficult to place patient No       Time coordinating discharge: under 30 minutes   SIGNED:   Harold Hedge, D.O. [facility] Pager: [phone]  01/15/2021, 12:56 PM

## 2021-01-15 NOTE — Evaluation (Addendum)
Physical Therapy Evaluation Patient Details Name: Kenneth Roberts MRN: [id number] DOB: 19-Sep-1941 Today's Date: 01/15/2021   History of Present Illness  This is a 80 year old male with past medical history of GERD, hypertension, alcohol use, PE on Eliquis, BPH who presented to the ED with abdominal pain and tremors since morning of 3/14.  Associated with nonbloody nausea and vomiting.  Also with constant tremors in arms and legs which she felt were uncontrollable. Patient found  To have alcoholic ketoacidosis.  Clinical Impression   The patient presents with decreased dynamic balance, required support to ambulate in room with and without device. Patient had much difficulty sitting up and required mod asistance. Noted posterior bias when ambulating  Without Rw and while standing to use urinal.  Patient lives alone with no support per patient. Patient uses bus for transportation. He stated that when he goes home he will walk to [place] to catch the bus. Patient  Currently would most like have difficulty getting there safely. Patient appears to be improving. Anticipate return to independent level as medical issue improve. Pt admitted with above diagnosis.   Pt currently with functional limitations due to the deficits listed below (see PT Problem List). Pt will benefit from skilled PT to increase their independence and safety with mobility to allow discharge to the venue listed below.       Follow Up Recommendations No PT follow up    Equipment Recommendations  None recommended by PT    Recommendations for Other Services       Precautions / Restrictions Precautions Precautions: Fall      Mobility  Bed Mobility Overal bed mobility: Needs Assistance Bed Mobility: Sit to Supine;Rolling;Sidelying to Sit;Supine to Sit Rolling: Min guard Sidelying to sit: Mod assist   Sit to supine: Min assist   General bed mobility comments: initially patient attempted to sit straight up and was unable.  Assisted patient to roll , pt. required modto max  assistance to sit upright from sidelying, required assistnace for legs to move over edge and then to scoot forward. Patient "stiff".    Transfers Overall transfer level: Needs assistance Equipment used: Rolling walker (2 wheeled);1 person hand held assist Transfers: Sit to/from Stand Sit to Stand: Mod assist         General transfer comment: initial standing required steady asisstance from gurney. Patient later stood from chair with min guard  Ambulation/Gait Ambulation/Gait assistance: Min assist Gait Distance (Feet): 20 Feet (then 30') Assistive device: Rolling walker (2 wheeled);None Gait Pattern/deviations: Staggering right;Staggering left;Step-through pattern;Decreased stride length Gait velocity: decr   General Gait Details: With RW patient required min guard assistnace. wightout RW, opatient required min  assistance with safety belt for balance.  Stairs            Wheelchair Mobility    Modified Rankin (Stroke Patients Only)       Balance Overall balance assessment: Needs assistance Sitting-balance support: Feet supported;No upper extremity supported Sitting balance-Leahy Scale: Fair Sitting balance - Comments: decreased ability to lean forward   Standing balance support: No upper extremity supported;During functional activity Standing balance-Leahy Scale: Poor Standing balance comment: required support at [place]. while standing to use urinal, patient propped oagainst gurney for balance, Noted posterior bias while ambulating without device.                             Pertinent Vitals/Pain Pain Assessment: Faces Faces Pain Scale: Hurts little more Pain Location: abdomen  Home Living Family/patient expects to be discharged to:: Private residence Living Arrangements: Alone   Type of Home: Apartment Home Access: [place]: One level Home Equipment: Environmental consultant - 2 wheels       Prior Function Level of Independence: Independent         Comments: takes bus for transportation     Hand Dominance        Extremity/Trunk Assessment   Upper Extremity Assessment Upper Extremity Assessment: Overall WFL for tasks assessed    Lower Extremity Assessment Lower Extremity Assessment: Generalized weakness    Cervical / Trunk Assessment Cervical / Trunk Assessment: Normal  Communication   Communication: No difficulties  Cognition Arousal/Alertness: Awake/alert Behavior During Therapy: WFL for tasks assessed/performed Overall Cognitive Status: No family/caregiver present to determine baseline cognitive functioning                                 General Comments: pt. states his BD is 03[birth year]19[birth year]43, epic has 11/26/42. patient mumbled something to explain that but was not completely clear      General Comments      Exercises     Assessment/Plan    PT Assessment Patient needs continued PT services  PT Problem List Decreased strength;Decreased mobility;Decreased activity tolerance;Decreased knowledge of use of DME;Decreased knowledge of precautions;Decreased range of motion       PT Treatment Interventions DME instruction;Therapeutic activities;Gait training;Therapeutic exercise;Patient/family education;Functional mobility training;Balance training    PT Goals (Current goals can be found in the Care Plan section)  Acute Rehab PT Goals Patient Stated Goal: to walk, go home PT Goal Formulation: With patient Time For Goal Achievement: 01/29/21 Potential to Achieve Goals: Good    Frequency Min 3X/week   Barriers to discharge Decreased caregiver support      Co-evaluation               AM-PAC PT "6 Clicks" Mobility  Outcome Measure Help needed turning from your back to your side while in a flat bed without using bedrails?: A Lot Help needed moving from lying on your back to sitting on the side of a flat bed without using bedrails?: A  Lot Help needed moving to and from a bed to a chair (including a wheelchair)?: A Lot Help needed standing up from a chair using your arms (e.g., wheelchair or bedside chair)?: A Lot Help needed to walk in hospital room?: A Lot Help needed climbing 3-5 steps with a railing? : A Lot 6 Click Score: 12    End of Session Equipment Utilized During Treatment: Gait belt Activity Tolerance: Patient tolerated treatment well Patient left: in bed;with call bell/phone within reach Nurse Communication: Mobility status PT Visit Diagnosis: Unsteadiness on feet (R26.81)    Time: 1000-1034 PT Time Calculation (min) (ACUTE ONLY): 34 min   Charges:   PT Evaluation $PT Eval Low Complexity: 1 Low PT Treatments $Gait Training: 8-22 mins        Tresa Endo PT Acute Rehabilitation Services Pager [phone] Office [phone]   Claretha Cooper 01/15/2021, 11:05 AM

## 2021-01-15 NOTE — ED Provider Notes (Addendum)
Patient signed out to me by Dr. Roslynn Amble.  Patient initially presented to the emergency department with "tremors".  Patient was given Ativan at arrival and his tremors resolved.  This did not resemble seizure activity.  There was a significant delay in obtaining labs secondary to laboratory problems.  Ultimately his initial labs came back with an anion gap acidosis.  This was treated with IV fluids and recheck.  Upon recheck, lactic acid as well as beta hydroxybutyric acid were drawn.  Patient has largely closed his anion gap, lactic acid is normal but he has moderately elevated beta hydroxybutyric acid.  Further questioning of the patient reveals that he did binge drink this past weekend, has not been eating anything while drinking.  Presentation consistent with alcoholic ketoacidosis.  Attempted to ambulate the patient to see if he could be discharged after another liter of fluids but he is unable to stand on his own, we will therefore admit patient for further treatment of alcoholic ketoacidosis.  Note: Initial labs did not cross into Epic        Orpah Greek, MD 01/15/21 1021    Orpah Greek, MD 01/15/21 [phone]

## 2021-01-15 NOTE — H&P (Signed)
History and Physical        Hospital Admission Note Date: 01/15/2021  Patient name: Kenneth Roberts Medical record number: [id number] Date of birth: 13-May-1941 Age: 80 y.o. Gender: male  PCP: Pcp, No   Chief Complaint    Chief Complaint  Patient presents with  . Tremors      HPI:   This is a 80 year old male with past medical history of GERD, hypertension, alcohol use, PE on Eliquis, BPH who presented to the ED with abdominal pain and tremors since morning of 3/14.  Associated with nonbloody nausea and vomiting.  Also with constant tremors in arms and legs which she felt were uncontrollable.  No history of seizures.  Upon arrival patient was alert and following commands and was given Ativan for suspected anxiety and his tremors had resolved.  Initially without known lab abnormalities since there was a delay in obtaining labs due to laboratory problems.  However, initial labs came back with anion gap acidosis which was treated with IV fluids.  Upon recheck lactic acid and beta hydroxybutyrate were drawn and anion gap had essentially closed with normal lactic acid however beta hydroxybutyric acid was moderately elevated and upon further questioning by ED physician he admitted to binge drinking over the weekend.  Currently, the patient states he feels much better but still with leg weakness.  He has not eaten anything yet and is awaiting his breakfast tray.  He denies any other complaints at this time.  ED Course: Afebrile, hypertensive, on room air.  Initial labs: Sodium 141, K4.1, CO2 20, glucose 147, BUN 11, creatinine 1.07, AG 21, alk phos 42, AST 74, ALT 47, total protein 8.7, T bili 1.9, WBC 5.6, platelets 120, UA 80 ketones, alcohol negative, beta hydroxybutyrate 3.36, COVID-19 and flu negative. notable Imaging: CT head without contrast-unremarkable.  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 Patient received Ativan, thiamine, 2 L NS bolus.    Vitals:   01/15/21 0600 01/15/21 0700  BP: (!) 147/96 (!) 171/106  Pulse: 82 72  Resp: (!) 23 (!) 21  Temp:    SpO2: 99% 100%     Review of Systems:  Review of Systems  All other systems reviewed and are negative.   Medical/Social/Family History   Past Medical History: Past Medical History:  Diagnosis Date  . Adenomatous colon polyp 01/2009  . Anemia   . BPH (benign prostatic hypertrophy)   . Cataract   . Chronic headaches   . Colon polyps   . Fatty liver   . GERD (gastroesophageal reflux disease)   . Heartburn   . Hepatic hemangioma   . Hiatal hernia   . Hypertension   . Internal hemorrhoids   . Leukopenia     Past Surgical History:  Procedure Laterality Date  . CATARACT EXTRACTION     Left eye  . COLONOSCOPY      Medications: Prior to Admission medications   Medication Sig Start Date End Date Taking? Authorizing Provider  apixaban (ELIQUIS) 5 MG TABS tablet Take 5 mg by mouth 2 (two) times daily.   Yes [provider]  clotrimazole-betamethasone (LOTRISONE) cream Apply 1 application topically 2 (  two) times daily. 01/05/21  Yes [provider]  dorzolamide-timolol (COSOPT) 22.3-6.8 MG/ML ophthalmic solution Place 1 drop into both eyes 2 (two) times daily. 11/28/19  Yes [provider]  finasteride (PROSCAR) 5 MG tablet Take 1 tablet (5 mg total) by mouth daily. 11/08/20 02/06/21 Yes Kayleen Memos, DO  hydrOXYzine (ATARAX/VISTARIL) 25 MG tablet Take 25 mg by mouth 2 (two) times daily. 10/29/20  Yes [provider]  losartan (COZAAR) 50 MG tablet Take 1 tablet (50 mg total) by mouth daily. 11/09/20 02/07/21 Yes Hall, Carole N, DO  pantoprazole (PROTONIX) 40 MG tablet Take 40 mg by mouth daily. 03/09/20  Yes [provider]  amLODipine (NORVASC) 5 MG tablet Take 1 tablet (5 mg total) by  mouth daily. Patient not taking: No sig reported 11/08/20 02/06/21  Kayleen Memos, DO  APIXABAN Arne [place]) VTE STARTER PACK ($RemoveBefor'10MG'FMNwbsefCqiM$  AND $Re'5MG'ZJP$ ) Take as directed on package: start with two-$RemoveBefore'5mg'xIKLvizrQPULn$  tablets twice daily for 7 days. On day 8, switch to one-$RemoveBefor'5mg'jQLzpnuLwCvC$  tablet twice daily. Patient not taking: No sig reported 11/08/20   Kayleen Memos, DO  tamsulosin (FLOMAX) 0.4 MG CAPS capsule Take 1 capsule (0.4 mg total) by mouth daily. Patient not taking: No sig reported 11/08/20 02/06/21  Kayleen Memos, DO    Allergies:   Allergies  Allergen Reactions  . Gadolinium Derivatives Other (See Comments)    unknown  . Prilosec [Omeprazole] Itching    Social History:  reports that he quit smoking about 39 years ago. He has never used smokeless tobacco. He reports current alcohol use. He reports that he does not use drugs.  Family History: Family History  Problem Relation Age of Onset  . Stomach cancer Mother   . Diabetes Father   . Diabetes Brother   . Diabetes Sister   . Colon cancer Neg Hx   . Colon polyps Neg Hx   . Esophageal cancer Neg Hx      Objective   Physical Exam: Blood pressure (!) 171/106, pulse 72, temperature 97.9 F (36.6 C), temperature source Oral, resp. rate (!) 21, height $RemoveBe'5\' 10"'RbGHxlWwO$  (1.778 m), weight 81.6 kg, SpO2 100 %.  Physical Exam Vitals and nursing note reviewed.  Constitutional:      Appearance: Normal appearance.  HENT:     Head: Normocephalic and atraumatic.  Eyes:     Conjunctiva/sclera: Conjunctivae normal.  Cardiovascular:     Rate and Rhythm: Normal rate and regular rhythm.  Pulmonary:     Effort: Pulmonary effort is normal.     Breath sounds: Normal breath sounds.  Abdominal:     General: Abdomen is flat.     Palpations: Abdomen is soft.  Musculoskeletal:        General: No swelling or tenderness.  Skin:    Coloration: Skin is not jaundiced or pale.  Neurological:     Mental Status: He is alert. Mental status is at baseline.  Psychiatric:        Mood and  Affect: Mood normal.        Behavior: Behavior normal.     LABS on Admission: I have personally reviewed all the labs and imaging below    Basic Metabolic Panel: Recent Labs  Lab 01/14/21 2050 01/15/21 0452 01/15/21 0457  NA 141 138  --   K 4.1 3.6  --   CL 100 101  --   CO2 20* 23  --   GLUCOSE 147* 88  --   BUN 11 12  --  CREATININE 1.07 1.02  --   CALCIUM 10.2 8.9  --   MG  --   --  1.8  PHOS  --   --  2.6   Liver Function Tests: Recent Labs  Lab 01/14/21 2050  AST 74*  ALT 47*  ALKPHOS 42  BILITOT 1.9*  PROT 8.7*  ALBUMIN 5.1*   Recent Labs  Lab 01/14/21 2050  LIPASE 40   No results for input(s): AMMONIA in the last 168 hours. CBC: Recent Labs  Lab 01/14/21 2050  WBC 5.6  HGB 14.2  HCT 39.8  MCV 89.2  PLT 120*   Cardiac Enzymes: No results for input(s): CKTOTAL, CKMB, CKMBINDEX, TROPONINI in the last 168 hours. BNP: Invalid input(s): POCBNP CBG: Recent Labs  Lab 01/14/21 2025  GLUCAP 139*    Radiological Exams on Admission:  CT Head Wo Contrast  Result Date: 01/14/2021 CLINICAL DATA:  Subarachnoid hemorrhage [facility]) suspected Awoke with tremor. EXAM: CT HEAD WITHOUT CONTRAST TECHNIQUE: Contiguous axial images were obtained from the base of the skull through the vertex without intravenous contrast. COMPARISON:  Most recent head CT 11/25/2005, brain MRI 07/17/2017 FINDINGS: Brain: No evidence of acute infarction, hemorrhage, hydrocephalus, extra-axial collection or mass lesion/mass effect. Stable degree of atrophy and chronic small vessel ischemia. Vascular: No hyperdense vessel. Skull: No fracture or focal lesion. Sinuses/Orbits: No acute findings allowing for motion. Paranasal sinuses and mastoid air cells are clear. Other: Mild motion artifact limitations. IMPRESSION: 1. No acute intracranial abnormality. 2. Grossly stable atrophy and chronic small vessel ischemia from 2018 MRI. Electronically Signed   By: Keith Rake M.D.   On: 01/14/2021  22:50   CT ABDOMEN PELVIS W CONTRAST  Result Date: 01/14/2021 CLINICAL DATA:  Abdominal pain, acute, nonlocalized abd pain, LLQ and RLQ EXAM: CT ABDOMEN AND PELVIS WITH CONTRAST TECHNIQUE: Multidetector CT imaging of the abdomen and pelvis was performed using the standard protocol following bolus administration of intravenous contrast. CONTRAST:  143mL OMNIPAQUE IOHEXOL 300 MG/ML  SOLN COMPARISON:  CT abdomen pelvis 10/03/2019, CT angio chest 11/06/2020, CT abdomen pelvis 09/01/2003 report without images, CT abdomen pelvis 09/03/2006 FINDINGS: Lower chest: Thin walled cystic lesion at the right base likely benign in etiology. Linear atelectasis or scarring at bilateral bases. No acute abnormality. Hepatobiliary: The hepatic parenchyma is diffusely hypodense compared to the splenic parenchyma consistent with fatty infiltration. Similar-appearing 1.9 x 1.3 cm heterogeneous hyperdense lesion within the left hepatic lobe (2:12). No gallstones, gallbladder wall thickening, or pericholecystic fluid. No biliary dilatation. Pancreas: No focal lesion. Normal pancreatic contour. No surrounding inflammatory changes. No main pancreatic ductal dilatation. Spleen: Normal in size without focal abnormality. Adrenals/Urinary Tract: No adrenal nodule bilaterally. Bilateral kidneys enhance symmetrically. No hydronephrosis. No hydroureter. The urinary bladder is unremarkable. Stomach/Bowel: Stomach is within normal limits. No evidence of bowel wall thickening or dilatation. Appendix appears normal. Vascular/Lymphatic: No abdominal aorta or iliac aneurysm. No abdominal, pelvic, or inguinal lymphadenopathy. Reproductive: Prostate is unremarkable. Other: No intraperitoneal free fluid. No intraperitoneal free gas. No organized fluid collection. Musculoskeletal: No abdominal wall hernia or abnormality. No suspicious lytic or blastic osseous lesions. No acute displaced fracture. Multilevel degenerative changes of the spine. IMPRESSION:  1. No acute intra-abdominal or intrapelvic abnormality. 2. Similar-appearing 1.9 cm hypodense lesion within left hepatic lobe consistent with a known hepatic hemangioma. Electronically Signed   By: Iven Finn M.D.   On: 01/14/2021 23:04      EKG: normal sinus rhythm, prolonged QT interval   A & P  Principal Problem:   Alcoholic ketoacidosis Active Problems:   Essential hypertension   BPH (benign prostatic hyperplasia)   HLD (hyperlipidemia)   Pulmonary embolism on right (HCC)   Alcohol withdrawal (HCC)   Prolonged QT interval   1. Alcoholic ketoacidosis a. Binge drinking over the weekend with ketones 3.36 and have not been rechecked after receiving IV fluids b. Encourage p.o. intake c. Additional IV fluid bolus d. Repeat lab work this afternoon and if improved and able to ambulate and eat then he can probably be discharged later today  2. Generalized tremors, likely alcohol withdrawal, resolved a. Noted on presentation, resolved after Ativan and not noted on personal exam b. CIWA protocol  3. Prolonged QT a. QTc 551 ms noted on presentation b. give potassium and magnesium for goal Mg> 2.0, K > 4.0 c. Hold QT prolonging agents  4. Generalized weakness a. Likely from poor p.o. intake, alcohol withdrawal b. Plan as above c. PT eval  5. Hypertension a. Continue home losartan  6. BPH a. Continue home tamsulosin and finasteride  7. History of PE a. Continue home Eliquis  8. Elevated LFTs a. Follow-up after IV fluids    DVT prophylaxis: Eliquis   Code Status: Full Code  Diet: Heart healthy Family Communication: Admission, patients condition and plan of care including tests being ordered have been discussed with the patient who indicates understanding and agrees with the plan and Code Status. Disposition Plan: The appropriate patient status for this patient is OBSERVATION. Observation status is judged to be reasonable and necessary in order to provide the  required intensity of service to ensure the patient's safety. The patient's presenting symptoms, physical exam findings, and initial radiographic and laboratory data in the context of their medical condition is felt to place them at decreased risk for further clinical deterioration. Furthermore, it is anticipated that the patient will be medically stable for discharge from the hospital within 2 midnights of admission. The following factors support the patient status of observation.   " The patient's presenting symptoms include generalized tremors. " The physical exam findings include unremarkable. " The initial radiographic and laboratory data are elevated ketones.    Status is: Observation  The patient remains OBS appropriate and will d/c before 2 midnights.  Dispo: The patient is from: Home              Anticipated d/c is to: Home              Patient currently is not medically stable to d/c.   Difficult to place patient No    Consultants  . None  Procedures  . None  Time Spent on Admission: 53 minutes    Harold Hedge, DO [facility]  01/15/2021, 9:04 AM

## 2021-02-06 ENCOUNTER — Other Ambulatory Visit: Payer: Self-pay

## 2021-02-06 ENCOUNTER — Encounter: Payer: Self-pay | Admitting: Podiatry

## 2021-02-06 ENCOUNTER — Ambulatory Visit (INDEPENDENT_AMBULATORY_CARE_PROVIDER_SITE_OTHER): Payer: Medicare Other | Admitting: Podiatry

## 2021-02-06 DIAGNOSIS — M79672 Pain in left foot: Secondary | ICD-10-CM

## 2021-02-06 DIAGNOSIS — M79676 Pain in unspecified toe(s): Secondary | ICD-10-CM

## 2021-02-06 DIAGNOSIS — M79671 Pain in right foot: Secondary | ICD-10-CM | POA: Diagnosis not present

## 2021-02-06 DIAGNOSIS — B351 Tinea unguium: Secondary | ICD-10-CM

## 2021-02-06 NOTE — Progress Notes (Signed)
This patient returns to the office for evaluation and treatment of long thick painful nails .  This patient is unable to trim his own nails since the patient cannot reach his feet.  Patient says the nails are painful walking and wearing his shoes.    He returns for preventive foot care services.  General Appearance  Alert, conversant and in no acute stress.  Vascular  Dorsalis pedis and posterior tibial  pulses are palpable  bilaterally.  Capillary return is within normal limits  bilaterally. Temperature is within normal limits  bilaterally.  Neurologic  Senn-Weinstein monofilament wire test within normal limits  bilaterally. Muscle power within normal limits bilaterally.  Nails Thick disfigured discolored nails with subungual debris  from hallux to fifth toes bilaterally. No evidence of bacterial infection or drainage bilaterally.  Orthopedic  No limitations of motion  feet .  No crepitus or effusions noted.  No bony pathology or digital deformities noted.  Skin  normotropic skin with no porokeratosis noted bilaterally.  No signs of infections or ulcers noted.  Resolved tinea pedis   Onychomycosis  Pain in toes right foot  Pain in toes left foot  Debridement  of nails  1-5  B/L with a nail nipper.  Nails were then filed using a dremel tool with no incidents. Patient is concerned about swollen right leg foot and big toe joint pain.  Told him to make an appointment with medical podiatrists.     RTC  10 weeks   Gardiner Barefoot DPM

## 2021-02-14 ENCOUNTER — Encounter: Payer: Self-pay | Admitting: Podiatry

## 2021-02-14 ENCOUNTER — Ambulatory Visit (INDEPENDENT_AMBULATORY_CARE_PROVIDER_SITE_OTHER): Payer: Medicare Other | Admitting: Podiatry

## 2021-02-14 ENCOUNTER — Other Ambulatory Visit: Payer: Self-pay

## 2021-02-14 DIAGNOSIS — R609 Edema, unspecified: Secondary | ICD-10-CM | POA: Diagnosis not present

## 2021-02-14 NOTE — Progress Notes (Signed)
Subjective:  Patient ID: Kenneth Roberts, male    DOB: 02-20-1941,  MRN: [id number]  Chief Complaint  Patient presents with  . Foot Pain    PT stated that he is here to have his right foot examined     80 y.o. male presents with the above complaint.  Patient presents with complaint of right leg swelling.  Patient states is more swollen than left-sided is doing much better now.  He states it came out of nowhere.  Just wanted get evaluated make sure that there is nothing going on.  He has not seen anyone else prior to see me for this.  He is normally treated office for arthritis and general foot care.  He denies any other acute complaints.  He does not wear compression socks or does not elevate.   Review of Systems: Negative except as noted in the HPI. Denies N/V/F/Ch.  Past Medical History:  Diagnosis Date  . Adenomatous colon polyp 01/2009  . Anemia   . BPH (benign prostatic hypertrophy)   . Cataract   . Chronic headaches   . Colon polyps   . Fatty liver   . GERD (gastroesophageal reflux disease)   . Heartburn   . Hepatic hemangioma   . Hiatal hernia   . Hypertension   . Internal hemorrhoids   . Leukopenia     Current Outpatient Medications:  .  amLODipine (NORVASC) 5 MG tablet, Take 1 tablet by mouth daily., Disp: , Rfl:  .  apixaban (ELIQUIS) 5 MG TABS tablet, Take 5 mg by mouth 2 (two) times daily., Disp: , Rfl:  .  cefdinir (OMNICEF) 300 MG capsule, TAKE 1 CAPSULE (300 MG TOTAL) BY MOUTH TWO TIMES DAILY FOR 8 DAYS., Disp: 16 capsule, Rfl: 0 .  clotrimazole-betamethasone (LOTRISONE) cream, Apply 1 application topically 2 (two) times daily., Disp: , Rfl:  .  dorzolamide-timolol (COSOPT) 22.3-6.8 MG/ML ophthalmic solution, Place 1 drop into both eyes 2 (two) times daily., Disp: , Rfl:  .  finasteride (PROSCAR) 5 MG tablet, TAKE 1 TABLET (5 MG TOTAL) BY MOUTH DAILY., Disp: 90 tablet, Rfl: 0 .  gabapentin (NEURONTIN) 300 MG capsule, Take 1 capsule by mouth daily., Disp: , Rfl:   .  hydrOXYzine (ATARAX/VISTARIL) 25 MG tablet, Take 25 mg by mouth 2 (two) times daily., Disp: , Rfl:  .  hydrOXYzine (ATARAX/VISTARIL) 50 MG tablet, Take 50 mg by mouth 3 (three) times daily., Disp: , Rfl:  .  losartan (COZAAR) 50 MG tablet, TAKE 1 TABLET (50 MG TOTAL) BY MOUTH DAILY., Disp: 90 tablet, Rfl: 0 .  pantoprazole (PROTONIX) 40 MG tablet, Take 40 mg by mouth daily., Disp: , Rfl:  .  tamsulosin (FLOMAX) 0.4 MG CAPS capsule, Take 0.4 mg by mouth daily., Disp: , Rfl:   Social History   Tobacco Use  Smoking Status Former Smoker  . Quit date: 02/16/1981  . Years since quitting: 40.0  Smokeless Tobacco Never Used    Allergies  Allergen Reactions  . Gadolinium Derivatives Other (See Comments)    unknown  . Prilosec [Omeprazole] Itching   Objective:  There were no vitals filed for this visit. There is no height or weight on file to calculate BMI. Constitutional Well developed. Well nourished.  Vascular Dorsalis pedis pulses palpable bilaterally. Posterior tibial pulses palpable bilaterally. Capillary refill normal to all digits.  No cyanosis or clubbing noted. Pedal hair growth normal.  Neurologic Normal speech. Oriented to person, place, and time. Epicritic sensation to light touch grossly present bilaterally.  Dermatologic  1+ pitting edema noted to the right lower extremity compared to the left side.  No ulceration noted.  No venous stasis noted.  No calf pain  Orthopedic: Normal joint ROM without pain or crepitus bilaterally. No visible deformities. No bony tenderness.   Radiographs: None Assessment:   1. Dependent edema    Plan:  Patient was evaluated and treated and all questions answered.  Right lower extremity dependent edema -I explained to the patient the etiology of edema versus treatment options were discussed.  Given the amount of swelling that is not present as encourage patient to wear compression socks as well as elevate very aggressively.  He  states understanding will do so immediately.  No follow-ups on file.

## 2021-02-19 ENCOUNTER — Ambulatory Visit (INDEPENDENT_AMBULATORY_CARE_PROVIDER_SITE_OTHER): Payer: Medicare Other | Admitting: Pulmonary Disease

## 2021-02-19 ENCOUNTER — Other Ambulatory Visit: Payer: Self-pay

## 2021-02-19 ENCOUNTER — Ambulatory Visit (INDEPENDENT_AMBULATORY_CARE_PROVIDER_SITE_OTHER): Payer: Medicare Other

## 2021-02-19 ENCOUNTER — Encounter: Payer: Self-pay | Admitting: Pulmonary Disease

## 2021-02-19 DIAGNOSIS — J189 Pneumonia, unspecified organism: Secondary | ICD-10-CM

## 2021-02-19 DIAGNOSIS — I2699 Other pulmonary embolism without acute cor pulmonale: Secondary | ICD-10-CM

## 2021-02-19 NOTE — Patient Instructions (Signed)
CXR today Complete course of eliquis for 6 months then stop & stay on aspirin 81 mg daily

## 2021-02-19 NOTE — Assessment & Plan Note (Signed)
He has completed 3 months of anticoagulation.  This was unprovoked PE, he had no risk factors.  There is no family history of VTE.  This was a small lobar PE without any evidence of RV strain Would recommend 3 to 6 months of anticoagulation Then transition to aspirin 81 mg daily If recurrence, then will need hypercoagulable work-up He is very concerned that Eliquis is causing his itching

## 2021-02-19 NOTE — Assessment & Plan Note (Addendum)
Appears resolved Chest x-ray today -independently reviewed shows no infiltrates or effusions

## 2021-02-19 NOTE — Progress Notes (Signed)
Subjective:    Patient ID: Kenneth Roberts, male    DOB: 17-Jan-1941, 80 y.o.   MRN: [id number]  HPI  80 year old never smoker referred by [facility] for evaluation of embolism. He had an ED visit 11/06/2020 for nonpleuritic nonexertional chest pain, COVID testing was negative, CT angiogram chest was performed which showed a small lobar right pulmonary embolism, bilateral lower extremity duplex was negative.  He was hospitalized for 2 days, treated with antibiotics for bibasilar pneumonia and discharged on Eliquis.  He was again evaluated 01/14/2021 for abdominal pain, labs showed increasing nongap acidosis.  History of drinking was obtained, he drinks 2 to 3 glasses of gin daily.  Ativan was given for tremors and he was discharged.  He now denies shortness of breath or chest pain.  He reports itching along his forearms and his chest and wonders if any medication is causing this.  He reports 3-4 loose stools daily.  He reports occasional right leg swelling Besides Eliquis he has been started on losartan and amlodipine for hypertension and wonders if any of these medications could be causing his symptoms  Significant tests/ events reviewed  CT chest 11/06/20 >> Small right upper lobe lobar pulmonary artery embolus. Bibasilar patchy and streaky densities may represent atelectasis but concerning for pneumonia.Small right pleural effusion   Past Medical History:  Diagnosis Date  . Adenomatous colon polyp 01/2009  . Anemia   . BPH (benign prostatic hypertrophy)   . Cataract   . Chronic headaches   . Colon polyps   . Fatty liver   . GERD (gastroesophageal reflux disease)   . Heartburn   . Hepatic hemangioma   . Hiatal hernia   . Hypertension   . Internal hemorrhoids   . Leukopenia   . Pulmonary embolism [facility])    Past Surgical History:  Procedure Laterality Date  . CATARACT EXTRACTION     Left eye  . COLONOSCOPY      Allergies  Allergen Reactions  . Gadolinium Derivatives  Other (See Comments)    unknown  . Prilosec [Omeprazole] Itching    Social History   Socioeconomic History  . Marital status: Married    Spouse name: Not on file  . Number of children: 8  . Years of education: Not on file  . Highest education level: Not on file  Occupational History  . Occupation: Retired    Fish farm manager: RETIRED  . Occupation: Retired  Tobacco Use  . Smoking status: Former Smoker    Packs/day: 0.25    Years: 35.00    Pack years: 8.75    Types: Cigarettes    Quit date: 02/16/1981    Years since quitting: 40.0  . Smokeless tobacco: Never Used  Vaping Use  . Vaping Use: Never used  Substance and Sexual Activity  . Alcohol use: Yes    Alcohol/week: 0.0 standard drinks  . Drug use: No  . Sexual activity: Yes    Birth control/protection: Condom  Other Topics Concern  . Not on file  Social History Narrative   ** Merged History Encounter **       Social Determinants of Health   Financial Resource Strain: Not on file  Food Insecurity: Not on file  Transportation Needs: Not on file  Physical Activity: Not on file  Stress: Not on file  Social Connections: Not on file  Intimate Partner Violence: Not on file    Family History  Problem Relation Age of Onset  . Stomach cancer Mother   .  Diabetes Father   . Diabetes Brother   . Diabetes Sister   . Colon cancer Neg Hx   . Colon polyps Neg Hx   . Esophageal cancer Neg Hx       Review of Systems Complains of itching all over her chest and forearms Leg swelling Joint stiffness   Constitutional: negative for anorexia, fevers and sweats  Eyes: negative for irritation, redness and visual disturbance  Ears, nose, mouth, throat, and face: negative for earaches, epistaxis, nasal congestion and sore throat  Respiratory: negative for cough, dyspnea on exertion, sputum and wheezing  Cardiovascular: negative for chest pain, dyspnea, lower extremity edema, orthopnea, palpitations and syncope  Gastrointestinal:  negative for abdominal pain, constipation, diarrhea, melena, nausea and vomiting  Genitourinary:negative for dysuria, frequency and hematuria  Hematologic/lymphatic: negative for bleeding, easy bruising and lymphadenopathy  Musculoskeletal:negative for arthralgias, muscle weakness and stiff joints  Neurological: negative for coordination problems, gait problems, headaches and weakness  Endocrine: negative for diabetic symptoms including polydipsia, polyuria and weight loss     Objective:   Physical Exam  Gen. Pleasant, well-nourished, in no distress, normal affect ENT - no pallor,icterus, no post nasal drip Neck: No JVD, no thyromegaly, no carotid bruits Lungs: no use of accessory muscles, no dullness to percussion, clear without rales or rhonchi  Cardiovascular: Rhythm regular, heart sounds  normal, no murmurs or gallops, no peripheral edema Abdomen: soft and non-tender, no hepatosplenomegaly, BS normal. Musculoskeletal: No deformities, no cyanosis or clubbing Neuro:  alert, non focal        Assessment & Plan:

## 2021-02-20 ENCOUNTER — Telehealth: Payer: Self-pay | Admitting: Pulmonary Disease

## 2021-02-20 NOTE — Telephone Encounter (Signed)
Called and spoke to pt. Informed him of the recs per Dr. Elsworth Soho. Pt verbalized understanding and denied any further questions or concerns at this time.

## 2021-02-20 NOTE — Telephone Encounter (Signed)
Since this was unprovoked PE, I would rather that he continue on it until July and then stop as we discussed during office visit

## 2021-02-20 NOTE — Telephone Encounter (Signed)
Rigoberto Noel, MD  02/20/2021 9:56 AM EDT      Pneumonia noted on his January chest x-ray has resolved   Called and spoke with pt letting him know the results of the cxr and he verbalized understanding. 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 Dr. Elsworth Soho, please advise.

## 2021-03-09 ENCOUNTER — Other Ambulatory Visit: Payer: Self-pay

## 2021-03-09 ENCOUNTER — Encounter (HOSPITAL_COMMUNITY): Payer: Self-pay

## 2021-03-09 ENCOUNTER — Emergency Department (HOSPITAL_COMMUNITY): Payer: Medicare Other

## 2021-03-09 ENCOUNTER — Emergency Department (HOSPITAL_COMMUNITY)
Admission: EM | Admit: 2021-03-09 | Discharge: 2021-03-10 | Disposition: A | Discharge status: Home / Self Care | Payer: Medicare Other | Attending: Emergency Medicine | Admitting: Emergency Medicine

## 2021-03-09 DIAGNOSIS — I10 Essential (primary) hypertension: Secondary | ICD-10-CM | POA: Insufficient documentation

## 2021-03-09 DIAGNOSIS — E872 Acidosis: Secondary | ICD-10-CM | POA: Diagnosis not present

## 2021-03-09 DIAGNOSIS — R109 Unspecified abdominal pain: Secondary | ICD-10-CM | POA: Diagnosis present

## 2021-03-09 DIAGNOSIS — Z87891 Personal history of nicotine dependence: Secondary | ICD-10-CM | POA: Diagnosis not present

## 2021-03-09 DIAGNOSIS — Z7901 Long term (current) use of anticoagulants: Secondary | ICD-10-CM | POA: Diagnosis not present

## 2021-03-09 DIAGNOSIS — E8729 Other acidosis: Secondary | ICD-10-CM

## 2021-03-09 DIAGNOSIS — Z79899 Other long term (current) drug therapy: Secondary | ICD-10-CM | POA: Insufficient documentation

## 2021-03-09 LAB — CBC WITH DIFFERENTIAL/PLATELET
Abs Immature Granulocytes: 0.02 10*3/uL (ref 0.00–0.07)
Basophils Absolute: 0.1 10*3/uL (ref 0.0–0.1)
Basophils Relative: 1 %
Eosinophils Absolute: 0 10*3/uL (ref 0.0–0.5)
Eosinophils Relative: 0 %
HCT: 41.4 % (ref 39.0–52.0)
Hemoglobin: 14.6 g/dL (ref 13.0–17.0)
Immature Granulocytes: 0 %
Lymphocytes Relative: 11 %
Lymphs Abs: 0.6 10*3/uL — ABNORMAL LOW (ref 0.7–4.0)
MCH: 31.6 pg (ref 26.0–34.0)
MCHC: 35.3 g/dL (ref 30.0–36.0)
MCV: 89.6 fL (ref 80.0–100.0)
Monocytes Absolute: 0.2 10*3/uL (ref 0.1–1.0)
Monocytes Relative: 4 %
Neutro Abs: 5 10*3/uL (ref 1.7–7.7)
Neutrophils Relative %: 84 %
Platelets: 159 10*3/uL (ref 150–400)
RBC: 4.62 MIL/uL (ref 4.22–5.81)
RDW: 14.6 % (ref 11.5–15.5)
WBC: 5.9 10*3/uL (ref 4.0–10.5)
nRBC: 0 % (ref 0.0–0.2)

## 2021-03-09 LAB — COMPREHENSIVE METABOLIC PANEL
ALT: 27 U/L (ref 0–44)
AST: 46 U/L — ABNORMAL HIGH (ref 15–41)
Albumin: 4.8 g/dL (ref 3.5–5.0)
Alkaline Phosphatase: 35 U/L — ABNORMAL LOW (ref 38–126)
Anion gap: 22 — ABNORMAL HIGH (ref 5–15)
BUN: 16 mg/dL (ref 8–23)
CO2: 18 mmol/L — ABNORMAL LOW (ref 22–32)
Calcium: 9.8 mg/dL (ref 8.9–10.3)
Chloride: 101 mmol/L (ref 98–111)
Creatinine, Ser: 1.21 mg/dL (ref 0.61–1.24)
GFR, Estimated: >60 mL/min (ref >60)
Glucose, Bld: 136 mg/dL — ABNORMAL HIGH (ref 70–99)
Potassium: 3.8 mmol/L (ref 3.5–5.1)
Sodium: 141 mmol/L (ref 135–145)
Total Bilirubin: 2.1 mg/dL — ABNORMAL HIGH (ref 0.3–1.2)
Total Protein: 8.6 g/dL — ABNORMAL HIGH (ref 6.5–8.1)

## 2021-03-09 LAB — URINALYSIS, ROUTINE W REFLEX MICROSCOPIC
Bacteria, UA: NONE SEEN
Bilirubin Urine: NEGATIVE
Glucose, UA: NEGATIVE mg/dL
Hgb urine dipstick: NEGATIVE
Ketones, ur: 80 mg/dL — AB
Leukocytes,Ua: NEGATIVE
Nitrite: NEGATIVE
Protein, ur: 30 mg/dL — AB
Specific Gravity, Urine: 1.016 (ref 1.005–1.030)
pH: 7 (ref 5.0–8.0)

## 2021-03-09 LAB — MAGNESIUM: Magnesium: 1.6 mg/dL — ABNORMAL LOW (ref 1.7–2.4)

## 2021-03-09 LAB — LIPASE, BLOOD: Lipase: 37 U/L (ref 11–51)

## 2021-03-09 MED ORDER — SODIUM CHLORIDE 0.9 % IV BOLUS
1000.0000 mL | Freq: Once | INTRAVENOUS | Status: AC
  Administered 2021-03-09: 1000 mL via INTRAVENOUS

## 2021-03-09 MED ORDER — IOHEXOL 300 MG/ML  SOLN
100.0000 mL | Freq: Once | INTRAMUSCULAR | Status: AC | PRN
  Administered 2021-03-09: 100 mL via INTRAVENOUS

## 2021-03-09 MED ORDER — MAGNESIUM SULFATE 2 GM/50ML IV SOLN
2.0000 g | Freq: Once | INTRAVENOUS | Status: AC
  Administered 2021-03-09: 2 g via INTRAVENOUS
  Filled 2021-03-09: qty 50

## 2021-03-09 NOTE — ED Provider Notes (Signed)
[facility] DEPT Provider Note   CSN: [id number] Arrival date & time: 03/09/21  2011     History Chief Complaint  Patient presents with  . Chills  . Abdominal Pain    Kenneth Roberts is a 80 y.o. male.  Patient presents with abdominal pain.  Describes as diffuse across his abdomen sharp in nature.  Started last night has been persistent all day today.  Associated with vomiting.  Denies any diarrhea.  Denies fevers or cough.        Past Medical History:  Diagnosis Date  . Adenomatous colon polyp 01/2009  . Anemia   . BPH (benign prostatic hypertrophy)   . Cataract   . Chronic headaches   . Colon polyps   . Fatty liver   . GERD (gastroesophageal reflux disease)   . Heartburn   . Hepatic hemangioma   . Hiatal hernia   . Hypertension   . Internal hemorrhoids   . Leukopenia   . Pulmonary embolism [facility])     Patient Active Problem List   Diagnosis Date Noted  . Alcoholic ketoacidosis 42/70/6237  . 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 Prolonged QT interval 01/15/2021  . Acute pulmonary embolism ([place]) 11/07/2020  . Pleuritic chest pain 11/06/2020  . Pulmonary embolism on right ([place]) 11/06/2020  . Community acquired pneumonia 11/06/2020  . Hiatal hernia 12/25/2016  . Routine general medical examination at a health care facility 12/22/2015  . Precordial chest pain 12/20/2015  . 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 Lumbago 01/30/2014  . HLD (hyperlipidemia) 07/21/2013  . Hemorrhoids 04/14/2012  . BPH (benign prostatic hyperplasia) 04/14/2012  . PERSONAL HX COLONIC POLYPS 01/20/2011  . Essential hypertension 04/27/2007  . GERD 04/27/2007    Past Surgical History:  Procedure Laterality Date  . CATARACT EXTRACTION     Left eye  . COLONOSCOPY         Family History  Problem Relation Age of Onset  . Stomach cancer Mother   . Diabetes Father   . Diabetes Brother   . Diabetes Sister   . Colon cancer Neg Hx   . Colon  polyps Neg Hx   . Esophageal cancer Neg Hx     Social History   Tobacco Use  . Smoking status: Former Smoker    Packs/day: 0.25    Years: 35.00    Pack years: 8.75    Types: Cigarettes    Quit date: 02/16/1981    Years since quitting: 40.0  . Smokeless tobacco: Never Used  Vaping Use  . Vaping Use: Never used  Substance Use Topics  . Alcohol use: Yes    Alcohol/week: 0.0 standard drinks  . Drug use: No    Home Medications Prior to Admission medications   Medication Sig Start Date End Date Taking? Authorizing Provider  amLODipine (NORVASC) 5 MG tablet Take 1 tablet by mouth daily. 01/22/21   [provider]  apixaban (ELIQUIS) 5 MG TABS tablet Take 5 mg by mouth 2 (two) times daily.    [provider]  clotrimazole-betamethasone (LOTRISONE) cream Apply 1 application topically 2 (two) times daily. 01/05/21   [provider]  dorzolamide-timolol (COSOPT) 22.3-6.8 MG/ML ophthalmic solution Place 1 drop into both eyes 2 (two) times daily. 11/28/19   [provider]  finasteride (PROSCAR) 5 MG tablet TAKE 1 TABLET (5 MG TOTAL) BY MOUTH DAILY. 11/08/20 11/08/21  Kayleen Memos, DO  gabapentin (NEURONTIN) 300 MG capsule Take 1 capsule by mouth daily. 01/22/21  [provider]  hydrOXYzine (ATARAX/VISTARIL) 50 MG tablet Take 50 mg by mouth 3 (three) times daily. 01/18/21   [provider]  losartan (COZAAR) 50 MG tablet TAKE 1 TABLET (50 MG TOTAL) BY MOUTH DAILY. 11/08/20 11/08/21  Kayleen Memos, DO  pantoprazole (PROTONIX) 40 MG tablet Take 40 mg by mouth daily. 03/09/20   [provider]  tamsulosin (FLOMAX) 0.4 MG CAPS capsule Take 0.4 mg by mouth daily. Patient not taking: Reported on 02/19/2021 01/22/21   [provider]    Allergies    Gadolinium derivatives and Prilosec [omeprazole]  Review of Systems   Review of Systems  Constitutional: Negative for fever.  HENT: Negative for ear pain and sore throat.   Eyes: Negative  for pain.  Respiratory: Negative for cough.   Cardiovascular: Negative for chest pain.  Gastrointestinal: Positive for abdominal pain.  Genitourinary: Negative for flank pain.  Musculoskeletal: Negative for back pain.  Skin: Negative for color change and rash.  Neurological: Negative for syncope.  All other systems reviewed and are negative.   Physical Exam Updated Vital Signs BP (!) 168/91   Pulse 87   Resp (!) 22   Ht 5\' 11"  (1.803 m)   Wt 85.4 kg   SpO2 100%   BMI 26.26 kg/m   Physical Exam Constitutional:      General: He is not in acute distress.    Appearance: He is well-developed.  HENT:     Head: Normocephalic.     Nose: Nose normal.  Eyes:     Extraocular Movements: Extraocular movements intact.  Cardiovascular:     Rate and Rhythm: Normal rate.  Pulmonary:     Effort: Pulmonary effort is normal.  Abdominal:     Tenderness: There is generalized abdominal tenderness and tenderness in the periumbilical area.  Skin:    Coloration: Skin is not jaundiced.  Neurological:     Mental Status: He is alert. Mental status is at baseline.     ED Results / Procedures / Treatments   Labs (all labs ordered are listed, but only abnormal results are displayed) Labs Reviewed  CBC WITH DIFFERENTIAL/PLATELET - Abnormal; Notable for the following components:      Result Value   Lymphs Abs 0.6 (*)    All other components within normal limits  COMPREHENSIVE METABOLIC PANEL - Abnormal; Notable for the following components:   CO2 18 (*)    Glucose, Bld 136 (*)    Total Protein 8.6 (*)    AST 46 (*)    Alkaline Phosphatase 35 (*)    Total Bilirubin 2.1 (*)    Anion gap 22 (*)    All other components within normal limits  URINALYSIS, ROUTINE W REFLEX MICROSCOPIC - Abnormal; Notable for the following components:   Ketones, ur 80 (*)    Protein, ur 30 (*)    All other components within normal limits  LIPASE, BLOOD  MAGNESIUM    EKG EKG  Interpretation  Date/Time:  Saturday Mar 09 2021 21:46:31 EDT Ventricular Rate:  78 PR Interval:  143 QRS Duration: 68 QT Interval:  480 QTC Calculation: 547 R Axis:   71 Text Interpretation: Sinus rhythm Probable left atrial enlargement Left ventricular hypertrophy Prolonged QT interval Confirmed by Thamas Jaegers ([id number]) on 03/09/2021 10:35:16 PM   Radiology No results found.  Procedures Procedures   Medications Ordered in ED Medications  iohexol (OMNIPAQUE) 300 MG/ML solution 100 mL (has no administration in time range)  sodium chloride 0.9 %  bolus 1,000 mL (0 mLs Intravenous Stopped 03/09/21 2233)    ED Course  I have reviewed the triage vital signs and the nursing notes.  Pertinent labs & imaging results that were available during my care of the patient were reviewed by me and considered in my medical decision making (see chart for details).    MDM Rules/Calculators/A&P                          Labs show white count 6 hemoglobin 14 chemistry unremarkable lipase normal as well.  Patient has a history of prolonged QT and appears consistent with prior EKGs today QTC over 500.  CT abdomen pelvis pending.  Patient was signed out to oncoming physician provider.  Final Clinical Impression(s) / ED Diagnoses Final diagnoses:  None    Rx / DC Orders ED Discharge Orders    None       Luna Fuse, MD 03/09/21 2252

## 2021-03-09 NOTE — ED Provider Notes (Signed)
  Provider Note MRN:  [id number]  Arrival date & time: 03/10/21    ED Course and Medical Decision Making  Assumed care from Dr. Almyra Free at shift change.  Suspect obstruction, awaiting CT scan.  QT is prolonged.  Clinical Course as of 03/10/21 8469  Sun Mar 10, 2021  0359 CT is without acute process.  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  Patient is overall feeling better and is tolerating p.o.  I consulted with Dr. Erlinda Hong of the hospitalist service, who agrees that if tolerating p.o. and BMP is improved patient would be a candidate for discharge.  Awaiting BMP.  Patient continues to clinically be doing well. [MB]    Clinical Course User Index [MB] Maudie Flakes, MD   BMP is much improved, gap is closed.  Patient continues to feel well and he is comfortable with plan for discharge.  Procedures  Final Clinical Impressions(s) / ED Diagnoses     ICD-10-CM   1. Abdominal pain, unspecified abdominal location  R10.9   2. 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  Your exam/testing today was overall reassuring.  Recommend cutting down on your drinking.  Can use the tigan medication for nausea as needed.  Please return to the Emergency Department if you experience any worsening of your condition.  Thank you for allowing [place] to be a part of your care.      Barth Kirks. Sedonia Small, [place] [email or web address]    Maudie Flakes, MD 03/10/21 [phone]

## 2021-03-09 NOTE — ED Triage Notes (Signed)
Pt to ED by EMS from home with c/o generalized Abdominal pain, chills, NV no diarrhea, cough, body aches and headache for the past few days. Arrives A+O, BP elevtaed, all other VSS, NADN.

## 2021-03-10 DIAGNOSIS — E872 Acidosis: Secondary | ICD-10-CM | POA: Diagnosis not present

## 2021-03-10 LAB — BASIC METABOLIC PANEL
Anion gap: 15 (ref 5–15)
BUN: 16 mg/dL (ref 8–23)
CO2: 22 mmol/L (ref 22–32)
Calcium: 9.5 mg/dL (ref 8.9–10.3)
Chloride: 103 mmol/L (ref 98–111)
Creatinine, Ser: 1.15 mg/dL (ref 0.61–1.24)
GFR, Estimated: >60 mL/min (ref >60)
Glucose, Bld: 108 mg/dL — ABNORMAL HIGH (ref 70–99)
Potassium: 3.8 mmol/L (ref 3.5–5.1)
Sodium: 140 mmol/L (ref 135–145)

## 2021-03-10 MED ORDER — LORAZEPAM 1 MG PO TABS
1.0000 mg | ORAL_TABLET | Freq: Once | ORAL | Status: AC
  Administered 2021-03-10: 1 mg via ORAL
  Filled 2021-03-10: qty 1

## 2021-03-10 MED ORDER — DEXTROSE 50 % IV SOLN
25.0000 mL | Freq: Once | INTRAVENOUS | Status: AC
  Administered 2021-03-10: 25 mL via INTRAVENOUS
  Filled 2021-03-10: qty 50

## 2021-03-10 MED ORDER — THIAMINE HCL 100 MG PO TABS
100.0000 mg | ORAL_TABLET | Freq: Once | ORAL | Status: AC
  Administered 2021-03-10: 100 mg via ORAL
  Filled 2021-03-10: qty 1

## 2021-03-10 MED ORDER — DEXTROSE 50 % IV SOLN: 1.0000 | Freq: Once | INTRAVENOUS | Status: DC

## 2021-03-10 MED ORDER — TRIMETHOBENZAMIDE HCL 300 MG PO CAPS: 300.0000 mg | ORAL_CAPSULE | Freq: Three times a day (TID) | ORAL | 0 refills | Status: DC | PRN

## 2021-03-10 NOTE — Discharge Instructions (Addendum)
You were evaluated in the Emergency Department and after careful evaluation, we did not find any emergent condition requiring admission or further testing in the hospital.  Your exam/testing today was overall reassuring.  Recommend cutting down on your drinking.  Can use the tigan medication for nausea as needed.  Please return to the Emergency Department if you experience any worsening of your condition.  Thank you for allowing [place] to be a part of your care.

## 2021-03-28 ENCOUNTER — Other Ambulatory Visit: Payer: Self-pay | Admitting: Internal Medicine

## 2021-03-29 LAB — COMPLETE METABOLIC PANEL WITH GFR
AG Ratio: 1.6 (calc) (ref 1.0–2.5)
ALT: 11 U/L (ref 9–46)
AST: 13 U/L (ref 10–35)
Albumin: 4.6 g/dL (ref 3.6–5.1)
Alkaline phosphatase (APISO): 29 U/L — ABNORMAL LOW (ref 35–144)
BUN: 10 mg/dL (ref 7–25)
CO2: 23 mmol/L (ref 20–32)
Calcium: 10 mg/dL (ref 8.6–10.3)
Chloride: 103 mmol/L (ref 98–110)
Creat: 0.96 mg/dL (ref 0.70–1.18)
GFR, Est African American: 87 mL/min/{1.73_m2} (ref >=60)
GFR, Est Non African American: 75 mL/min/{1.73_m2} (ref >=60)
Globulin: 2.8 g/dL (calc) (ref 1.9–3.7)
Glucose, Bld: 64 mg/dL — ABNORMAL LOW (ref 65–99)
Potassium: 4.5 mmol/L (ref 3.5–5.3)
Sodium: 139 mmol/L (ref 135–146)
Total Bilirubin: 0.8 mg/dL (ref 0.2–1.2)
Total Protein: 7.4 g/dL (ref 6.1–8.1)

## 2021-03-29 LAB — CBC
HCT: 42.3 % (ref 38.5–50.0)
Hemoglobin: 13.7 g/dL (ref 13.2–17.1)
MCH: 31.6 pg (ref 27.0–33.0)
MCHC: 32.4 g/dL (ref 32.0–36.0)
MCV: 97.5 fL (ref 80.0–100.0)
MPV: 10.9 fL (ref 7.5–12.5)
Platelets: 242 10*3/uL (ref 140–400)
RBC: 4.34 10*6/uL (ref 4.20–5.80)
RDW: 13.6 % (ref 11.0–15.0)
WBC: 3.5 10*3/uL — ABNORMAL LOW (ref 3.8–10.8)

## 2021-03-29 LAB — LIPID PANEL
Cholesterol: 200 mg/dL — ABNORMAL HIGH (ref <200)
HDL: 67 mg/dL (ref >=40)
LDL Cholesterol (Calc): 113 mg/dL (calc) — ABNORMAL HIGH
Non-HDL Cholesterol (Calc): 133 mg/dL (calc) — ABNORMAL HIGH (ref <130)
Total CHOL/HDL Ratio: 3 (calc) (ref <5.0)
Triglycerides: 94 mg/dL (ref <150)

## 2021-03-29 LAB — PSA: PSA: 0.2 ng/mL (ref <=4.00)

## 2021-03-29 LAB — TSH: TSH: 1.98 mIU/L (ref 0.40–4.50)

## 2021-04-10 ENCOUNTER — Encounter: Payer: Self-pay | Admitting: Podiatry

## 2021-04-10 ENCOUNTER — Ambulatory Visit (INDEPENDENT_AMBULATORY_CARE_PROVIDER_SITE_OTHER): Payer: Medicare Other | Admitting: Podiatry

## 2021-04-10 ENCOUNTER — Other Ambulatory Visit: Payer: Self-pay

## 2021-04-10 DIAGNOSIS — M216X9 Other acquired deformities of unspecified foot: Secondary | ICD-10-CM | POA: Diagnosis not present

## 2021-04-10 DIAGNOSIS — M79676 Pain in unspecified toe(s): Secondary | ICD-10-CM | POA: Diagnosis not present

## 2021-04-10 DIAGNOSIS — B351 Tinea unguium: Secondary | ICD-10-CM | POA: Diagnosis not present

## 2021-04-10 DIAGNOSIS — L84 Corns and callosities: Secondary | ICD-10-CM | POA: Diagnosis not present

## 2021-04-10 NOTE — Progress Notes (Signed)
This patient returns to the office for evaluation and treatment of long thick painful nails .  This patient is unable to trim his own nails since the patient cannot reach his feet.  Patient says the nails are painful walking and wearing his shoes.    He returns for preventive foot care services.  General Appearance  Alert, conversant and in no acute stress.  Vascular  Dorsalis pedis and posterior tibial  pulses are palpable  bilaterally.  Capillary return is within normal limits  bilaterally. Temperature is within normal limits  bilaterally.  Neurologic  Senn-Weinstein monofilament wire test within normal limits  bilaterally. Muscle power within normal limits bilaterally.  Nails Thick disfigured discolored nails with subungual debris  from hallux to fifth toes bilaterally. No evidence of bacterial infection or drainage bilaterally.  Orthopedic  No limitations of motion  feet .  No crepitus or effusions noted.  No bony pathology or digital deformities noted.  Prominent fifth metatarsal  B/L.  Skin  normotropic skin with no porokeratosis noted bilaterally.  No signs of infections or ulcers noted.  Mild peeling plantar aspect feet  B/L.  Asymptomatic callus sub 5th  B/L.  Onychomycosis  Pain in toes right foot  Pain in toes left foot  Debridement  of nails  1-5  B/L with a nail nipper.  Nails were then filed using a dremel tool with no incidents.  Marland Kitchen     RTC  10 weeks   Gardiner Barefoot DPM

## 2021-04-29 ENCOUNTER — Ambulatory Visit: Payer: Self-pay | Admitting: *Deleted

## 2021-04-29 NOTE — Telephone Encounter (Signed)
Pt received a letter over the weekend and per pt told to call [phone] concerning incidental finding on  03-09-2021  ct abd/pelvis with contrast        Advised pt to contact PCP to review CT results. Pt verbalizes understanding.

## 2021-05-16 ENCOUNTER — Encounter: Payer: Self-pay | Admitting: Podiatry

## 2021-05-16 ENCOUNTER — Telehealth: Payer: Self-pay | Admitting: Podiatry

## 2021-05-16 NOTE — Telephone Encounter (Signed)
Called patient lvm to reschedule 8/24 appt and sent reschedule letter  

## 2021-06-26 ENCOUNTER — Ambulatory Visit: Payer: Medicare Other | Admitting: Podiatry

## 2021-07-05 ENCOUNTER — Telehealth: Payer: Self-pay | Admitting: *Deleted

## 2021-07-05 NOTE — Telephone Encounter (Signed)
Patient returned call to office, someone has called him.   Returned call back to patient, no answer, left vmessage that he has an upcoming appointment 07/10/21, please call back to confirm.

## 2021-07-09 NOTE — Telephone Encounter (Signed)
error 

## 2021-07-10 ENCOUNTER — Other Ambulatory Visit: Payer: Self-pay

## 2021-07-10 ENCOUNTER — Ambulatory Visit: Payer: Medicare Other | Admitting: Podiatry

## 2021-07-10 ENCOUNTER — Ambulatory Visit (INDEPENDENT_AMBULATORY_CARE_PROVIDER_SITE_OTHER): Payer: Medicare Other | Admitting: Podiatry

## 2021-07-10 ENCOUNTER — Encounter: Payer: Self-pay | Admitting: Podiatry

## 2021-07-10 DIAGNOSIS — B351 Tinea unguium: Secondary | ICD-10-CM | POA: Diagnosis not present

## 2021-07-10 DIAGNOSIS — L84 Corns and callosities: Secondary | ICD-10-CM

## 2021-07-10 DIAGNOSIS — M79676 Pain in unspecified toe(s): Secondary | ICD-10-CM

## 2021-07-10 DIAGNOSIS — R600 Localized edema: Secondary | ICD-10-CM

## 2021-07-10 DIAGNOSIS — M216X9 Other acquired deformities of unspecified foot: Secondary | ICD-10-CM

## 2021-07-10 NOTE — Progress Notes (Signed)
This patient returns to the office for evaluation and treatment of long thick painful nails .  This patient is unable to trim his own nails since the patient cannot reach his feet.  Patient says the nails are painful walking and wearing his shoes.    He returns for preventive foot care services.  General Appearance  Alert, conversant and in no acute stress.  Vascular  Dorsalis pedis and posterior tibial  pulses are palpable  bilaterally.  Capillary return is within normal limits  bilaterally. Temperature is within normal limits  bilaterally.  Neurologic  Senn-Weinstein monofilament wire test within normal limits  bilaterally. Muscle power within normal limits bilaterally.  Nails Thick disfigured discolored nails with subungual debris  from hallux to fifth toes bilaterally. No evidence of bacterial infection or drainage bilaterally.  Orthopedic  No limitations of motion  feet .  No crepitus or effusions noted.  No bony pathology or digital deformities noted.  Prominent fifth metatarsal  B/L. Above ankle swelling  B/L.  Skin  normotropic skin with no porokeratosis noted bilaterally.  No signs of infections or ulcers noted.  Symptomatic callus sub 5th  B/L.  Onychomycosis  Pain in toes right foot  Pain in toes left foot  Debridement  of nails  1-5  B/L with a nail nipper.  Nails were then filed using a dremel tool with no incidents.  .Patient to be seen by medical podiatrists.     RTC  10 weeks   Gardiner Barefoot DPM

## 2021-07-17 ENCOUNTER — Ambulatory Visit (INDEPENDENT_AMBULATORY_CARE_PROVIDER_SITE_OTHER): Payer: Medicare Other | Admitting: Podiatry

## 2021-07-17 ENCOUNTER — Other Ambulatory Visit: Payer: Self-pay

## 2021-07-17 DIAGNOSIS — R609 Edema, unspecified: Secondary | ICD-10-CM

## 2021-07-17 NOTE — Progress Notes (Signed)
Subjective:  Patient ID: Kenneth Roberts, male    DOB: 1940-11-14,  MRN: [id number]  Chief Complaint  Patient presents with   Foot Pain    Swelling in feet and legs     80 y.o. male presents with the above complaint.  Patient presents with complaint of right leg swelling.  Patient states this started swollen again.  He was doing much better however came out of nowhere again.  He has not been wearing compression socks or doing any kind of elevation.  He denies any other acute complaints.  He has not seen me for this since April   Review of Systems: Negative except as noted in the HPI. Denies N/V/F/Ch.  Past Medical History:  Diagnosis Date   Adenomatous colon polyp 01/2009   Anemia    BPH (benign prostatic hypertrophy)    Cataract    Chronic headaches    Colon polyps    Fatty liver    GERD (gastroesophageal reflux disease)    Heartburn    Hepatic hemangioma    Hiatal hernia    Hypertension    Internal hemorrhoids    Leukopenia    Pulmonary embolism ([facility])     Current Outpatient Medications:    amLODipine (NORVASC) 5 MG tablet, Take 1 tablet by mouth daily., Disp: , Rfl:    apixaban (ELIQUIS) 5 MG TABS tablet, Take 5 mg by mouth 2 (two) times daily., Disp: , Rfl:    clotrimazole-betamethasone (LOTRISONE) cream, Apply 1 application topically 2 (two) times daily., Disp: , Rfl:    dorzolamide-timolol (COSOPT) 22.3-6.8 MG/ML ophthalmic solution, Place 1 drop into both eyes 2 (two) times daily., Disp: , Rfl:    finasteride (PROSCAR) 5 MG tablet, TAKE 1 TABLET (5 MG TOTAL) BY MOUTH DAILY., Disp: 90 tablet, Rfl: 0   gabapentin (NEURONTIN) 300 MG capsule, Take 1 capsule by mouth daily., Disp: , Rfl:    hydrOXYzine (ATARAX/VISTARIL) 50 MG tablet, Take 50 mg by mouth 3 (three) times daily., Disp: , Rfl:    losartan (COZAAR) 50 MG tablet, TAKE 1 TABLET (50 MG TOTAL) BY MOUTH DAILY., Disp: 90 tablet, Rfl: 0   pantoprazole (PROTONIX) 40 MG tablet, Take 40 mg by mouth daily., Disp: , Rfl:     tamsulosin (FLOMAX) 0.4 MG CAPS capsule, Take 0.4 mg by mouth daily., Disp: , Rfl:    traZODone (DESYREL) 100 MG tablet, Take 100 mg by mouth at bedtime., Disp: , Rfl:    trimethobenzamide (TIGAN) 300 MG capsule, Take 1 capsule (300 mg total) by mouth 3 (three) times daily as needed for nausea/vomiting., Disp: 20 capsule, Rfl: 0  Social History   Tobacco Use  Smoking Status Former   Packs/day: 0.25   Years: 35.00   Pack years: 8.75   Types: Cigarettes   Quit date: 02/16/1981   Years since quitting: 40.4  Smokeless Tobacco Never    Allergies  Allergen Reactions   Gadolinium Derivatives Other (See Comments)    unknown   Prilosec [Omeprazole] Itching   Objective:  There were no vitals filed for this visit. There is no height or weight on file to calculate BMI. Constitutional Well developed. Well nourished.  Vascular Dorsalis pedis pulses palpable bilaterally. Posterior tibial pulses palpable bilaterally. Capillary refill normal to all digits.  No cyanosis or clubbing noted. Pedal hair growth normal.  Neurologic Normal speech. Oriented to person, place, and time. Epicritic sensation to light touch grossly present bilaterally.  Dermatologic  1+ pitting edema noted to the right lower extremity compared  to the left side.  No ulceration noted.  No venous stasis noted.  No calf pain  Orthopedic: Normal joint ROM without pain or crepitus bilaterally. No visible deformities. No bony tenderness.   Radiographs: None Assessment:   1. Dependent edema     Plan:  Patient was evaluated and treated and all questions answered.  Right lower extremity dependent edema -I explained to the patient the etiology of edema versus treatment options were discussed.  I again we discussed that given the amount of swelling that is present as encourage patient to wear compression socks as well as elevate very aggressively.  He states understanding will do so immediately. -If any foot and ankle  issues arise in future come back and see me.  He states understanding.  No follow-ups on file.

## 2021-08-30 ENCOUNTER — Ambulatory Visit (INDEPENDENT_AMBULATORY_CARE_PROVIDER_SITE_OTHER): Payer: Medicare Other | Admitting: Gastroenterology

## 2021-08-30 ENCOUNTER — Inpatient Hospital Stay (HOSPITAL_COMMUNITY)
Admission: EM | Admit: 2021-08-30 | Discharge: 2021-09-02 | DRG: 177 | Disposition: A | Discharge status: Home / Self Care | Payer: Medicare Other | Source: Ambulatory Visit | Attending: Internal Medicine | Admitting: Internal Medicine

## 2021-08-30 ENCOUNTER — Encounter (HOSPITAL_COMMUNITY): Payer: Self-pay

## 2021-08-30 ENCOUNTER — Encounter: Payer: Self-pay | Admitting: Gastroenterology

## 2021-08-30 ENCOUNTER — Emergency Department (HOSPITAL_COMMUNITY): Payer: Medicare Other

## 2021-08-30 VITALS — BP 140/90 | HR 94 | Ht 71.0 in | Wt 182.8 lb

## 2021-08-30 DIAGNOSIS — R109 Unspecified abdominal pain: Secondary | ICD-10-CM | POA: Diagnosis not present

## 2021-08-30 DIAGNOSIS — Z789 Other specified health status: Secondary | ICD-10-CM

## 2021-08-30 DIAGNOSIS — Z800 Family history of malignant neoplasm of digestive organs: Secondary | ICD-10-CM

## 2021-08-30 DIAGNOSIS — F1090 Alcohol use, unspecified, uncomplicated: Secondary | ICD-10-CM

## 2021-08-30 DIAGNOSIS — D649 Anemia, unspecified: Secondary | ICD-10-CM | POA: Diagnosis present

## 2021-08-30 DIAGNOSIS — Z79899 Other long term (current) drug therapy: Secondary | ICD-10-CM | POA: Diagnosis not present

## 2021-08-30 DIAGNOSIS — Z8719 Personal history of other diseases of the digestive system: Secondary | ICD-10-CM | POA: Diagnosis not present

## 2021-08-30 DIAGNOSIS — F10239 Alcohol dependence with withdrawal, unspecified: Secondary | ICD-10-CM | POA: Diagnosis present

## 2021-08-30 DIAGNOSIS — N400 Enlarged prostate without lower urinary tract symptoms: Secondary | ICD-10-CM | POA: Diagnosis present

## 2021-08-30 DIAGNOSIS — Z86711 Personal history of pulmonary embolism: Secondary | ICD-10-CM

## 2021-08-30 DIAGNOSIS — J1282 Pneumonia due to coronavirus disease 2019: Secondary | ICD-10-CM | POA: Diagnosis present

## 2021-08-30 DIAGNOSIS — Z86010 Personal history of colonic polyps: Secondary | ICD-10-CM | POA: Diagnosis not present

## 2021-08-30 DIAGNOSIS — I10 Essential (primary) hypertension: Secondary | ICD-10-CM | POA: Diagnosis present

## 2021-08-30 DIAGNOSIS — K219 Gastro-esophageal reflux disease without esophagitis: Secondary | ICD-10-CM | POA: Diagnosis present

## 2021-08-30 DIAGNOSIS — Z7901 Long term (current) use of anticoagulants: Secondary | ICD-10-CM | POA: Diagnosis not present

## 2021-08-30 DIAGNOSIS — F10930 Alcohol use, unspecified with withdrawal, uncomplicated: Secondary | ICD-10-CM

## 2021-08-30 DIAGNOSIS — J189 Pneumonia, unspecified organism: Secondary | ICD-10-CM | POA: Diagnosis present

## 2021-08-30 DIAGNOSIS — R933 Abnormal findings on diagnostic imaging of other parts of digestive tract: Secondary | ICD-10-CM | POA: Diagnosis not present

## 2021-08-30 DIAGNOSIS — R9431 Abnormal electrocardiogram [ECG] [EKG]: Secondary | ICD-10-CM | POA: Diagnosis present

## 2021-08-30 DIAGNOSIS — Z833 Family history of diabetes mellitus: Secondary | ICD-10-CM | POA: Diagnosis not present

## 2021-08-30 DIAGNOSIS — U071 COVID-19: Secondary | ICD-10-CM | POA: Diagnosis present

## 2021-08-30 DIAGNOSIS — Y905 Blood alcohol level of 100-119 mg/100 ml: Secondary | ICD-10-CM | POA: Diagnosis present

## 2021-08-30 DIAGNOSIS — F10939 Alcohol use, unspecified with withdrawal, unspecified: Secondary | ICD-10-CM | POA: Diagnosis present

## 2021-08-30 DIAGNOSIS — Z87891 Personal history of nicotine dependence: Secondary | ICD-10-CM

## 2021-08-30 DIAGNOSIS — E8721 Acute metabolic acidosis: Secondary | ICD-10-CM | POA: Diagnosis present

## 2021-08-30 LAB — COMPREHENSIVE METABOLIC PANEL
ALT: 10 U/L (ref 0–44)
AST: 26 U/L (ref 15–41)
Albumin: 4.1 g/dL (ref 3.5–5.0)
Alkaline Phosphatase: 50 U/L (ref 38–126)
Anion gap: 20 — ABNORMAL HIGH (ref 5–15)
BUN: 10 mg/dL (ref 8–23)
CO2: 19 mmol/L — ABNORMAL LOW (ref 22–32)
Calcium: 9.2 mg/dL (ref 8.9–10.3)
Chloride: 101 mmol/L (ref 98–111)
Creatinine, Ser: 1 mg/dL (ref 0.61–1.24)
GFR, Estimated: >60 mL/min (ref >60)
Glucose, Bld: 108 mg/dL — ABNORMAL HIGH (ref 70–99)
Potassium: 3.9 mmol/L (ref 3.5–5.1)
Sodium: 140 mmol/L (ref 135–145)
Total Bilirubin: 0.7 mg/dL (ref 0.3–1.2)
Total Protein: 8.4 g/dL — ABNORMAL HIGH (ref 6.5–8.1)

## 2021-08-30 LAB — LIPASE, BLOOD: Lipase: 48 U/L (ref 11–51)

## 2021-08-30 LAB — TROPONIN I (HIGH SENSITIVITY)
Troponin I (High Sensitivity): 4 ng/L (ref <18)
Troponin I (High Sensitivity): 7 ng/L (ref <18)

## 2021-08-30 LAB — CBC WITH DIFFERENTIAL/PLATELET
Abs Immature Granulocytes: 0.03 10*3/uL (ref 0.00–0.07)
Basophils Absolute: 0.1 10*3/uL (ref 0.0–0.1)
Basophils Relative: 1 %
Eosinophils Absolute: 0.1 10*3/uL (ref 0.0–0.5)
Eosinophils Relative: 1 %
HCT: 35 % — ABNORMAL LOW (ref 39.0–52.0)
Hemoglobin: 12.3 g/dL — ABNORMAL LOW (ref 13.0–17.0)
Immature Granulocytes: 0 %
Lymphocytes Relative: 15 %
Lymphs Abs: 1.1 10*3/uL (ref 0.7–4.0)
MCH: 31.6 pg (ref 26.0–34.0)
MCHC: 35.1 g/dL (ref 30.0–36.0)
MCV: 90 fL (ref 80.0–100.0)
Monocytes Absolute: 0.6 10*3/uL (ref 0.1–1.0)
Monocytes Relative: 9 %
Neutro Abs: 5.2 10*3/uL (ref 1.7–7.7)
Neutrophils Relative %: 74 %
Platelets: 223 10*3/uL (ref 150–400)
RBC: 3.89 MIL/uL — ABNORMAL LOW (ref 4.22–5.81)
RDW: 15.1 % (ref 11.5–15.5)
WBC: 7 10*3/uL (ref 4.0–10.5)
nRBC: 0 % (ref 0.0–0.2)

## 2021-08-30 LAB — RAPID URINE DRUG SCREEN, HOSP PERFORMED
Amphetamines: NOT DETECTED
Barbiturates: NOT DETECTED
Benzodiazepines: NOT DETECTED
Cocaine: NOT DETECTED
Opiates: NOT DETECTED
Tetrahydrocannabinol: NOT DETECTED

## 2021-08-30 LAB — URINALYSIS, ROUTINE W REFLEX MICROSCOPIC
Bilirubin Urine: NEGATIVE
Glucose, UA: NEGATIVE mg/dL
Hgb urine dipstick: NEGATIVE
Ketones, ur: 20 mg/dL — AB
Leukocytes,Ua: NEGATIVE
Nitrite: NEGATIVE
Protein, ur: NEGATIVE mg/dL
Specific Gravity, Urine: 1.014 (ref 1.005–1.030)
pH: 7 (ref 5.0–8.0)

## 2021-08-30 LAB — LACTIC ACID, PLASMA
Lactic Acid, Venous: 9 mmol/L (ref 0.5–1.9)
Lactic Acid, Venous: 4.7 mmol/L (ref 0.5–1.9)

## 2021-08-30 LAB — RESP PANEL BY RT-PCR (FLU A&B, COVID) ARPGX2
Influenza A by PCR: NEGATIVE
Influenza B by PCR: NEGATIVE
SARS Coronavirus 2 by RT PCR: POSITIVE — AB

## 2021-08-30 LAB — ETHANOL: Alcohol, Ethyl (B): 116 mg/dL — ABNORMAL HIGH (ref <10)

## 2021-08-30 MED ORDER — ASCORBIC ACID 500 MG PO TABS
500.0000 mg | ORAL_TABLET | Freq: Every day | ORAL | Status: DC
  Administered 2021-08-30 – 2021-09-02 (×4): 500 mg via ORAL
  Filled 2021-08-30 (×4): qty 1

## 2021-08-30 MED ORDER — ACETAMINOPHEN 650 MG RE SUPP
650.0000 mg | Freq: Four times a day (QID) | RECTAL | Status: DC | PRN
  Filled 2021-08-30: qty 1

## 2021-08-30 MED ORDER — LOSARTAN POTASSIUM 50 MG PO TABS
50.0000 mg | ORAL_TABLET | Freq: Every day | ORAL | Status: DC
  Administered 2021-08-30 – 2021-09-02 (×4): 50 mg via ORAL
  Filled 2021-08-30: qty 1
  Filled 2021-08-30 (×2): qty 2
  Filled 2021-08-30: qty 1

## 2021-08-30 MED ORDER — LORAZEPAM 1 MG PO TABS
1.0000 mg | ORAL_TABLET | ORAL | Status: AC | PRN
  Administered 2021-09-01: 1 mg via ORAL
  Filled 2021-08-30 (×2): qty 1

## 2021-08-30 MED ORDER — SODIUM CHLORIDE 0.9 % IV SOLN
200.0000 mg | Freq: Once | INTRAVENOUS | Status: AC
  Administered 2021-08-30: 200 mg via INTRAVENOUS
  Filled 2021-08-30: qty 40

## 2021-08-30 MED ORDER — THIAMINE HCL 100 MG/ML IJ SOLN: 100.0000 mg | Freq: Every day | INTRAMUSCULAR | Status: DC

## 2021-08-30 MED ORDER — ONDANSETRON 4 MG PO TBDP
4.0000 mg | ORAL_TABLET | Freq: Once | ORAL | Status: DC
  Filled 2021-08-30: qty 1

## 2021-08-30 MED ORDER — ZINC SULFATE 220 (50 ZN) MG PO CAPS
220.0000 mg | ORAL_CAPSULE | Freq: Every day | ORAL | Status: DC
  Administered 2021-08-30 – 2021-09-02 (×4): 220 mg via ORAL
  Filled 2021-08-30 (×4): qty 1

## 2021-08-30 MED ORDER — HYDROCOD POLST-CPM POLST ER 10-8 MG/5ML PO SUER: 5.0000 mL | Freq: Two times a day (BID) | ORAL | Status: DC | PRN

## 2021-08-30 MED ORDER — ADULT MULTIVITAMIN W/MINERALS CH
1.0000 | ORAL_TABLET | Freq: Every day | ORAL | Status: DC
  Administered 2021-08-30 – 2021-09-02 (×4): 1 via ORAL
  Filled 2021-08-30 (×4): qty 1

## 2021-08-30 MED ORDER — IOHEXOL 350 MG/ML SOLN
100.0000 mL | Freq: Once | INTRAVENOUS | Status: AC | PRN
  Administered 2021-08-30: 80 mL via INTRAVENOUS

## 2021-08-30 MED ORDER — LACTATED RINGERS IV SOLN
INTRAVENOUS | Status: DC
Start: 2021-08-30 — End: 2021-09-01

## 2021-08-30 MED ORDER — LORAZEPAM 2 MG/ML IJ SOLN
1.0000 mg | Freq: Once | INTRAMUSCULAR | Status: AC
  Administered 2021-08-30: 1 mg via INTRAVENOUS
  Filled 2021-08-30: qty 1

## 2021-08-30 MED ORDER — LACTATED RINGERS IV BOLUS
1000.0000 mL | Freq: Once | INTRAVENOUS | Status: AC
  Administered 2021-08-30: 1000 mL via INTRAVENOUS

## 2021-08-30 MED ORDER — LORAZEPAM 1 MG PO TABS: 0.0000 mg | ORAL_TABLET | Freq: Two times a day (BID) | ORAL | Status: DC

## 2021-08-30 MED ORDER — LORAZEPAM 2 MG/ML IJ SOLN
1.0000 mg | INTRAMUSCULAR | Status: AC | PRN
  Administered 2021-08-30: 1 mg via INTRAVENOUS
  Administered 2021-08-31: 2 mg via INTRAVENOUS
  Administered 2021-09-01: 1 mg via INTRAVENOUS
  Filled 2021-08-30 (×3): qty 1

## 2021-08-30 MED ORDER — ACETAMINOPHEN 325 MG PO TABS
650.0000 mg | ORAL_TABLET | Freq: Four times a day (QID) | ORAL | Status: DC | PRN
  Administered 2021-08-31: 650 mg via ORAL
  Filled 2021-08-30 (×2): qty 2

## 2021-08-30 MED ORDER — FOLIC ACID 1 MG PO TABS
1.0000 mg | ORAL_TABLET | Freq: Every day | ORAL | Status: DC
  Administered 2021-08-30 – 2021-09-02 (×4): 1 mg via ORAL
  Filled 2021-08-30 (×4): qty 1

## 2021-08-30 MED ORDER — THIAMINE HCL 100 MG PO TABS
100.0000 mg | ORAL_TABLET | Freq: Every day | ORAL | Status: DC
  Administered 2021-08-30 – 2021-09-02 (×4): 100 mg via ORAL
  Filled 2021-08-30 (×4): qty 1

## 2021-08-30 MED ORDER — MAGNESIUM OXIDE -MG SUPPLEMENT 400 (240 MG) MG PO TABS
800.0000 mg | ORAL_TABLET | Freq: Once | ORAL | Status: AC
  Administered 2021-08-30: 800 mg via ORAL
  Filled 2021-08-30: qty 2

## 2021-08-30 MED ORDER — ENOXAPARIN SODIUM 40 MG/0.4ML IJ SOSY: 40.0000 mg | PREFILLED_SYRINGE | INTRAMUSCULAR | Status: DC

## 2021-08-30 MED ORDER — GUAIFENESIN-DM 100-10 MG/5ML PO SYRP: 10.0000 mL | ORAL_SOLUTION | ORAL | Status: DC | PRN

## 2021-08-30 MED ORDER — PANTOPRAZOLE SODIUM 40 MG PO TBEC
40.0000 mg | DELAYED_RELEASE_TABLET | Freq: Every day | ORAL | Status: DC
  Administered 2021-08-30 – 2021-09-02 (×4): 40 mg via ORAL
  Filled 2021-08-30 (×4): qty 1

## 2021-08-30 MED ORDER — KETOROLAC TROMETHAMINE 15 MG/ML IJ SOLN
15.0000 mg | Freq: Once | INTRAMUSCULAR | Status: AC
  Administered 2021-08-30: 15 mg via INTRAVENOUS
  Filled 2021-08-30: qty 1

## 2021-08-30 MED ORDER — LORAZEPAM 1 MG PO TABS
0.0000 mg | ORAL_TABLET | Freq: Four times a day (QID) | ORAL | Status: AC
  Administered 2021-08-30 – 2021-08-31 (×2): 2 mg via ORAL
  Administered 2021-09-01: 1 mg via ORAL
  Filled 2021-08-30 (×3): qty 2

## 2021-08-30 MED ORDER — REMDESIVIR 100 MG IV SOLR
100.0000 mg | Freq: Every day | INTRAVENOUS | Status: DC
  Administered 2021-08-31 – 2021-09-01 (×2): 100 mg via INTRAVENOUS
  Filled 2021-08-30 (×3): qty 20

## 2021-08-30 MED ORDER — SODIUM CHLORIDE 0.9 % IV SOLN
2.0000 g | Freq: Once | INTRAVENOUS | Status: AC
  Administered 2021-08-30: 2 g via INTRAVENOUS
  Filled 2021-08-30: qty 20

## 2021-08-30 MED ORDER — SODIUM CHLORIDE 0.9 % IV SOLN
2.0000 g | INTRAVENOUS | Status: DC
  Administered 2021-08-31 – 2021-09-01 (×2): 2 g via INTRAVENOUS
  Filled 2021-08-30 (×3): qty 20

## 2021-08-30 NOTE — Progress Notes (Signed)
HPI :  80 year old male here for routine follow-up visit, I last saw him in October 2020.  Recall that he has a history of chronic abdominal discomfort, bloating, dyspepsia, globus.  He also has a history of alcohol abuse.  See prior notes for details of his case, he has had an extensive evaluation to date with prior endoscopy, colonoscopy, capsule study, multiple CT scans and lab work, GES, ENT evaluation.  He has had trials of probiotics, TCAs, buspirone in the past.  At the last time I saw him in October 2020 his main complaint was abdominal bloating and continued abdominal discomfort.  I gave him a free sample of rifaximin and at that time he reported it helped him and provided some significant relief.  I have not seen him in about 2 years.  In review of the chart he has had multiple ED visits for severe abdominal pain, some while being intoxicated.  He has had 3 CT scans since have last seen him.  None of these exam showed any acute pathology however on the last CT scan in May of this year he had a 1.3 cm nodule at the tail of his pancreas for which a follow-up MRI was recommended.  It does not appear that he has had that yet.  He was also noted to have fatty liver and a suspected benign nodule of his liver.  He presents today for routine follow-up, however he is in acute distress.  He is tremulous, cannot stop shaking, cannot describe to me what he is feeling other than he has severe abdominal pain and is tender to palpation.  I tried getting him on the exam table and he is quite unsteady on his feet, did not want to lie down.  Had some retching in the office but no vomiting.  He states he drank a lot of alcohol last night but cannot quantify how much.  He cannot quantify how long he has been having abdominal pain or when this started other that he is in distress and is asking for pain medication.  He is unable to provide much of any history.  He was dropped off here today by public transport and has  no ride home.  He most recently had a colonoscopy in 2017 which did not show any adenomatous polyps.  He does have diverticulosis.   Most recent workup: Colonoscopy 02/26/16 - diverticulosis, hemorrhoids, 45mm ascending colon polyp which was benign inflammatory polyp Colonoscopy 01/02/2016 - poor prep EGD 5/16 - irregular z-line, small hiatal hernia, normal stomach and duodenum - biopsies normal, no BE or celiac Colonoscopy May 2016 - poor prep Colonoscopy 2010 - adenoma  CT scan 01/2016 - no cause for symptoms noted, benign hepatic hemagioma UGI series 2008-  - nonspecific dysmotility, 5mm tablet got stuck at aortic arch Gastric emptying study 06/03/2017 - normal CT scan abdomen / pelvis with contrast 07/23/2017 - 1.8cm hemangioma left hepatic lobe, mild DJD, no acute findings CT scan 07/17/19 - stable 2.2cm hepatic hemangioma, no concerning pathology   3 CTs for abdominal pain with ED visits since our last visit:  CT abdomen / pelvis with contrast 10/03/19 -IMPRESSION: 1. No CT evidence for acute intra-abdominal pathology, specifically no current evidence of acute pancreatitis. 2. Probable hepatic steatosis. 3. Stable left hepatic lobe hemangioma.   CT abdomen / pelvis with contrast 01/14/21 - IMPRESSION: 1. No acute intra-abdominal or intrapelvic abnormality. 2. Similar-appearing 1.9 cm hypodense lesion within left hepatic lobe consistent with a known hepatic hemangioma.  CT abdomen pelvis with contrast 03/09/21 - IMPRESSION: 1. No acute intra-abdominal or pelvic pathology. 2. A 13 mm indeterminate hypodense nodule in the tail of the pancreas. Further characterization with MRI without and with contrast on a nonemergent/outpatient basis recommended. 3. Fatty liver. 4. A 2 cm nodular enhancement in the left lobe of the liver similar to prior CT, likely cysts a hemangioma or vascular shunting. Attention on follow-up imaging recommended. 5. Aortic Atherosclerosis (ICD10-I70.0).   Past  Medical History:  Diagnosis Date   Adenomatous colon polyp 01/2009   Anemia    BPH (benign prostatic hypertrophy)    Cataract    Chronic headaches    Colon polyps    Fatty liver    GERD (gastroesophageal reflux disease)    Heartburn    Hepatic hemangioma    Hiatal hernia    Hypertension    Internal hemorrhoids    Leukopenia    Pulmonary embolism (HCC)      Past Surgical History:  Procedure Laterality Date   CATARACT EXTRACTION     Left eye   COLONOSCOPY     Family History  Problem Relation Age of Onset   Stomach cancer Mother    Diabetes Father    Diabetes Brother    Diabetes Sister    Colon cancer Neg Hx    Colon polyps Neg Hx    Esophageal cancer Neg Hx    Social History   Tobacco Use   Smoking status: Former    Packs/day: 0.25    Years: 35.00    Pack years: 8.75    Types: Cigarettes    Quit date: 02/16/1981    Years since quitting: 40.5   Smokeless tobacco: Never  Vaping Use   Vaping Use: Never used  Substance Use Topics   Alcohol use: Yes    Alcohol/week: 0.0 standard drinks   Drug use: No   Current Outpatient Medications  Medication Sig Dispense Refill   amLODipine (NORVASC) 5 MG tablet Take 1 tablet by mouth daily.     clotrimazole-betamethasone (LOTRISONE) cream Apply 1 application topically 2 (two) times daily.     dorzolamide-timolol (COSOPT) 22.3-6.8 MG/ML ophthalmic solution Place 1 drop into both eyes 2 (two) times daily.     finasteride (PROSCAR) 5 MG tablet TAKE 1 TABLET (5 MG TOTAL) BY MOUTH DAILY. 90 tablet 0   gabapentin (NEURONTIN) 300 MG capsule Take 1 capsule by mouth daily.     hydrOXYzine (ATARAX/VISTARIL) 50 MG tablet Take 50 mg by mouth 3 (three) times daily.     losartan (COZAAR) 50 MG tablet TAKE 1 TABLET (50 MG TOTAL) BY MOUTH DAILY. 90 tablet 0   pantoprazole (PROTONIX) 40 MG tablet Take 40 mg by mouth daily.     tamsulosin (FLOMAX) 0.4 MG CAPS capsule Take 0.4 mg by mouth daily.     traZODone (DESYREL) 100 MG tablet Take 100  mg by mouth at bedtime.     trimethobenzamide (TIGAN) 300 MG capsule Take 1 capsule (300 mg total) by mouth 3 (three) times daily as needed for nausea/vomiting. 20 capsule 0   No current facility-administered medications for this visit.   Allergies  Allergen Reactions   Gadolinium Derivatives Other (See Comments)    unknown   Prilosec [Omeprazole] Itching     Review of Systems: All systems reviewed and negative except where noted in HPI.    No results found.  Physical Exam: BP 140/90   Pulse 94   Ht 5\' 11"  (1.803 m)   Wt  182 lb 12.8 oz (82.9 kg)   BMI 25.50 kg/m  Constitutional: Pleasant, male in acute distress, wincing. Cardiovascular: Normal rate, regular rhythm.  Pulmonary/chest: Effort normal and breath sounds normal. No wheezing, rales or rhonchi. Abdominal: Soft, nondistended, significant abdominal TTP.  Neurological: Alert but seems confused, unable to answer simple questions Skin: Skin is warm and dry.     ASSESSMENT AND PLAN: 80 y/o male here for reassessment of the following issues:  Abdominal pain Alcohol use Abnormal CT scan abdomen Bloating  Patient known to me for chronic abdominal bloating / abdominal discomfort, has had extensive evaluation in the past for this. Presented for routine follow up visit today however presents in acute 10/10 pain, tremulous, unable to provide much history other than he hurts and has not been tolerating much PO. He has had ED visits in the past for acute abdominal pain, 3 since I have last seen, history of EtOH. States he drank quite a bit last night, may be acutely intoxicated, mental status appears altered, unclear if he may have pancreatitis given his symptoms but given his discomfort and presentation he needs more urgent evaluation in the ED, he is quite uncomfortable at this time. Unfortunately no family with him, no transport, we called EMS to transport him to the ED and called the ED staff to update them on his case. He has  had prior CT scan showing a pancreatic lesion several months ago, he needs an outpatient MRCP once he is more stable. He is in agreement with plan. EMS arrived to transport him to the hospital.   Jolly Mango, MD [facility]

## 2021-08-30 NOTE — H&P (Addendum)
History and Physical    Kenneth Roberts WCH:[id number] DOB: [birth year] DOA: 08/30/2021  PCP: Sonia Side., FNP  Patient coming from: Home  Chief Complaint: stomach  HPI: Kenneth Roberts is a 80 y.o. male with medical history significant of BPH, GERD, HTN, chronic abdominal pain, EtOH abuse. Presenting with abdominal pain. He reports that he's had a couple of days of shaking and chills. He didn't try any medications for it. He denies fevers. He reports that this morning he woke up with a sharp pain all over his stomach. It was constant. It made him feel nauseous, but he did not vomit. He decided to see his GI doc, but it was recommended that he come to the ED instead. He does report alcohol use within the last 24 hours. He denies any other aggravating or alleviating factors.    ED Course: He was noted to be tremulous. His EtOH level was 116. Drug screen was negative. His lactic acid was 9. He was given ativan and fluids. CT ab/pelvis showed LLL consolidation. He was started on rocephin. [facility] was called for admission.   Review of Systems:  Denies CP, dyspnea, palpitations, V/D, fever. Reports chills, shakes, fatigue, abdominal pain. Review of systems is otherwise negative for all not mentioned in HPI.   PMHx Past Medical History:  Diagnosis Date   Adenomatous colon polyp 01/2009   Anemia    BPH (benign prostatic hypertrophy)    Cataract    Chronic headaches    Colon polyps    Fatty liver    GERD (gastroesophageal reflux disease)    Heartburn    Hepatic hemangioma    Hiatal hernia    Hypertension    Internal hemorrhoids    Leukopenia    Pulmonary embolism (HCC)     PSHx Past Surgical History:  Procedure Laterality Date   CATARACT EXTRACTION     Left eye   COLONOSCOPY      SocHx  reports that he quit smoking about 40 years ago. His smoking use included cigarettes. He has a 8.75 pack-year smoking history. He has never used smokeless tobacco. He reports current alcohol use.  He reports that he does not use drugs.  Allergies  Allergen Reactions   Gadolinium Derivatives Other (See Comments)    unknown   Prilosec [Omeprazole] Itching    FamHx Family History  Problem Relation Age of Onset   Stomach cancer Mother    Diabetes Father    Diabetes Brother    Diabetes Sister    Colon cancer Neg Hx    Colon polyps Neg Hx    Esophageal cancer Neg Hx     Prior to Admission medications   Medication Sig Start Date End Date Taking? Authorizing Provider  amLODipine (NORVASC) 5 MG tablet Take 1 tablet by mouth daily. 01/22/21   [provider]  apixaban (ELIQUIS) 5 MG TABS tablet Take 5 mg by mouth 2 (two) times daily. Patient states not taking. He ran out Patient not taking: Reported on 08/30/2021    [provider]  clotrimazole-betamethasone (LOTRISONE) cream Apply 1 application topically 2 (two) times daily. 01/05/21   [provider]  dorzolamide-timolol (COSOPT) 22.3-6.8 MG/ML ophthalmic solution Place 1 drop into both eyes 2 (two) times daily. 11/28/19   [provider]  finasteride (PROSCAR) 5 MG tablet TAKE 1 TABLET (5 MG TOTAL) BY MOUTH DAILY. 11/08/20 11/08/21  Kayleen Memos, DO  gabapentin (NEURONTIN) 300 MG capsule Take 1 capsule by mouth daily. 01/22/21  [provider]  hydrOXYzine (ATARAX/VISTARIL) 50 MG tablet Take 50 mg by mouth 3 (three) times daily. 01/18/21   [provider]  losartan (COZAAR) 50 MG tablet TAKE 1 TABLET (50 MG TOTAL) BY MOUTH DAILY. 11/08/20 11/08/21  Kayleen Memos, DO  pantoprazole (PROTONIX) 40 MG tablet Take 40 mg by mouth daily. 03/09/20   [provider]  tamsulosin (FLOMAX) 0.4 MG CAPS capsule Take 0.4 mg by mouth daily. 01/22/21   [provider]  traZODone (DESYREL) 100 MG tablet Take 100 mg by mouth at bedtime. 03/28/21   [provider]  trimethobenzamide (TIGAN) 300 MG capsule Take 1 capsule (300 mg total) by mouth 3 (three) times daily as needed for  nausea/vomiting. 03/10/21   Maudie Flakes, MD    Physical Exam: Vitals:   08/30/21 1227 08/30/21 1230 08/30/21 1331 08/30/21 1345  BP: (!) 145/85 (!) 145/85  (!) 157/130  Pulse: 86 86 85 85  Resp:  (!) 22 15 13   Temp:      TempSrc:      SpO2:  95% 98% 99%    General: 81 y.o. male resting in bed in NAD Eyes: PERRL, normal sclera ENMT: Nares patent w/o discharge, orophaynx clear, dentition normal, ears w/o discharge/lesions/ulcers Neck: Supple, trachea midline Cardiovascular: RRR, +S1, S2, no m/g/r, equal pulses throughout Respiratory: decreased at bases, no w/r/r, normal WOB GI: BS+, NDNT, no masses noted, no organomegaly noted MSK: No e/c/c Neuro: A&O x 3, no focal deficits, he is tremulous Psyc: Flat affect, otherwise, cooperative  Labs on Admission: I have personally reviewed following labs and imaging studies  CBC: Recent Labs  Lab 08/30/21 1050  WBC 7.0  NEUTROABS 5.2  HGB 12.3*  HCT 35.0*  MCV 90.0  PLT 903   Basic Metabolic Panel: Recent Labs  Lab 08/30/21 1050  NA 140  K 3.9  CL 101  CO2 19*  GLUCOSE 108*  BUN 10  CREATININE 1.00  CALCIUM 9.2   GFR: Estimated Creatinine Clearance: 64.8 mL/min (by C-G formula based on SCr of 1 mg/dL). Liver Function Tests: Recent Labs  Lab 08/30/21 1050  AST 26  ALT 10  ALKPHOS 50  BILITOT 0.7  PROT 8.4*  ALBUMIN 4.1   Recent Labs  Lab 08/30/21 1050  LIPASE 48   No results for input(s): AMMONIA in the last 168 hours. Coagulation Profile: No results for input(s): INR, PROTIME in the last 168 hours. Cardiac Enzymes: No results for input(s): CKTOTAL, CKMB, CKMBINDEX, TROPONINI in the last 168 hours. BNP (last 3 results) No results for input(s): PROBNP in the last 8760 hours. HbA1C: No results for input(s): HGBA1C in the last 72 hours. CBG: No results for input(s): GLUCAP in the last 168 hours. Lipid Profile: No results for input(s): CHOL, HDL, LDLCALC, TRIG, CHOLHDL, LDLDIRECT in the last 72  hours. Thyroid Function Tests: No results for input(s): TSH, T4TOTAL, FREET4, T3FREE, THYROIDAB in the last 72 hours. Anemia Panel: No results for input(s): VITAMINB12, FOLATE, FERRITIN, TIBC, IRON, RETICCTPCT in the last 72 hours. Urine analysis:    Component Value Date/Time   COLORURINE YELLOW 03/09/2021 2052   APPEARANCEUR CLEAR 03/09/2021 2052   LABSPEC 1.016 03/09/2021 2052   PHURINE 7.0 03/09/2021 2052   GLUCOSEU NEGATIVE 03/09/2021 2052   GLUCOSEU NEGATIVE 12/20/2015 1047   HGBUR NEGATIVE 03/09/2021 2052   BILIRUBINUR NEGATIVE 03/09/2021 2052   BILIRUBINUR NEG 03/25/2013 1442   KETONESUR 80 (A) 03/09/2021 2052   PROTEINUR 30 (A) 03/09/2021 2052   UROBILINOGEN  0.2 12/20/2015 1047   NITRITE NEGATIVE 03/09/2021 2052   LEUKOCYTESUR NEGATIVE 03/09/2021 2052    Radiological Exams on Admission: CT ABDOMEN PELVIS W CONTRAST  Result Date: 08/30/2021 CLINICAL DATA:  Epigastric abdominal pain, concern for bowel ischemia, elevated lactate EXAM: CT ABDOMEN AND PELVIS WITH CONTRAST TECHNIQUE: Multidetector CT imaging of the abdomen and pelvis was performed using the standard protocol following bolus administration of intravenous contrast. CONTRAST:  52mL OMNIPAQUE IOHEXOL 350 MG/ML SOLN COMPARISON:  CT abdomen/pelvis 03/09/2021 FINDINGS: Lower chest: There are patchy and linear opacities in the lung bases. There is consolidative opacity in the anterior left base. The imaged heart is unremarkable. Hepatobiliary: The liver is hypoattenuating suggesting fatty infiltration. There is no defined focus of nodular enhancement in the left hepatic lobe measuring approximately 1.5 cm. This lesion has been present since 2017 and most likely reflects a hemangioma. There are no new focal lesions. The gallbladder is unremarkable. There is no biliary ductal dilatation. Pancreas: Unremarkable. Spleen: Unremarkable. Adrenals/Urinary Tract: The adrenals are unremarkable. A tiny hypodense lesion in the left kidney  is unchanged, too small to characterize but likely a small cyst. There are no other focal lesions. There are no stones. There is no hydronephrosis or hydroureter. The bladder is decompressed but grossly unremarkable. Stomach/Bowel: The stomach is unremarkable. There is no evidence of bowel obstruction. There is no abnormal bowel wall thickening or inflammatory change. There is no pneumatosis intestinalis. The appendix is normal. Vascular/Lymphatic: The abdominal aorta is nonaneurysmal. The celiac artery, SMA, and IMA are patent. The SMV and main portal and splenic veins are patent. There is no portal venous gas. There is no abdominal or pelvic lymphadenopathy. Reproductive: The prostate and seminal vesicles are unremarkable. Other: There is no ascites or free air. Musculoskeletal: A sclerotic lesion in the left iliac wing is unchanged. There is no acute osseous abnormality or suspicious osseous lesion. IMPRESSION: 1. No acute finding in the abdomen or pelvis. No evidence of bowel ischemia. Patent aorta and major branch vessels. 2. Fatty liver. 3. Patchy opacities in the lung bases probably predominantly reflect atelectasis, but there is a more consolidative appearing opacity in the anterior portion of the left lower lobe which is suspicious for pneumonia. Electronically Signed   By: Valetta Mole M.D.   On: 08/30/2021 13:34   DG Chest Portable 1 View  Result Date: 08/30/2021 CLINICAL DATA:  Shortness of breath EXAM: PORTABLE CHEST 1 VIEW COMPARISON:  02/19/2021 FINDINGS: Normal heart size and stable aortic tortuosity. Haziness at the lung bases with streaky density. There is no edema, consolidation, effusion, or pneumothorax. Artifact from EKG leads. IMPRESSION: Atelectatic type opacity at the lung bases. Electronically Signed   By: Jorje Guild M.D.   On: 08/30/2021 11:24    EKG: Independently reviewed. Sinus, no st elevation  Assessment/Plan EtOH withdrawal EtOH abuse     - admit to inpt,  progressive     - add CIWA protocol     - thiamine, MVI, folate     - fluids     - counseled on abstaining from EtOH  LLL PNA     - lactic acid was 9; after fluids, 4.7     - rocephin started; has prolonged QT - no zithro     - ok on RA     - continue abx, fluids, follow lactic acid  UPDATE: COVID POSITIVE - at this point we are treating him for this LLL PNA; will add remdesivir  Normocytic anemia     -  no evidence of bleed; check iron studies  HTN     - continue home regimen  Prolonged Qt     - K+ ok; check Mg2+; avoid QT prolonging meds  DVT prophylaxis: SCDs  Code Status: FULL  Family Communication: None at bedside  Consults called: None   Status is: Inpatient  Remains inpatient appropriate because: severity of illness   Jonnie Finner DO [facility]  If 7PM-7AM, please contact night-coverage [email or web address]  08/30/2021, 2:08 PM

## 2021-08-30 NOTE — ED Notes (Signed)
Pt is refusing all additional lab sticks. Pt encouraged to allow staff to stick . However pt states that he does not want to be poked. Pt informed this is detrimental to his care and that he needs to have lab work tracked. Pt informed that labs will need to be collected throughout his stay or he will need to sign out against medical advice as we cannot treat without appropriate information. Pt verbalized understanding.

## 2021-08-30 NOTE — Patient Instructions (Addendum)
If you are age 78 or older, your body mass index should be between 23-30. Your Body mass index is 25.5 kg/m. If this is out of the aforementioned range listed, please consider follow up with your Primary Care Provider.  If you are age 58 or younger, your body mass index should be between 19-25. Your Body mass index is 25.5 kg/m. If this is out of the aformentioned range listed, please consider follow up with your Primary Care Provider.   ________________________________________________________  The [facility] providers would like to encourage you to use [facility] to communicate with providers for non-urgent requests or questions.  Due to long hold times on the telephone, sending your provider a message by [facility] may be a faster and more efficient way to get a response.  Please allow 48 business hours for a response.  Please remember that this is for non-urgent requests.  _______________________________________________________  EMS has been called to transport you to the ED for evaluation and treatment.  Thank you for entrusting me with your care and for choosing [facility], Dr. Shadeland Cellar

## 2021-08-30 NOTE — ED Provider Notes (Signed)
Emergency Medicine Provider Triage Evaluation Note  Kenneth Roberts , a 80 y.o. male  was evaluated in triage.  Pt presented via EMS with concern for abdominal pain.  He presented PCP for regular checkup, never had severe abdominal pain, was tremulous, and appeared very ill so EMS was called.  Patient states last drink was last night when he is unable to tell me how regularly he drinks.  Does endorse history of alcohol withdrawal  Review of Systems  Positive: Abdominal pain, nausea, vomiting, tremulousness Negative: Chest pain, shortness of breath, palpitatio  Physical Exam  BP (!) 154/105 (BP Location: Left Arm)   Pulse 100   Temp 98.6 F (37 C) (Oral)   Resp 18   SpO2 100%  Gen:   Awake, tremulous, visibly uncomfortable, asking for help  resp:  Normal effort, tachypneic MSK:   Moves extremities without difficulty  Other:  Abdomen soft, nondistended, nontender.  Lungs CTA B.  Tachycardic with regular rhythm.  Medical Decision Making  Medically screening exam initiated at 10:59 AM.  Appropriate orders placed.  Marquavion Venhuizen was informed that the remainder of the evaluation will be completed by another provider, this initial triage assessment does not replace that evaluation, and the importance of remaining in the ED until their evaluation is complete.  Patient is very ill-appearing.  Initially was concern for possible dissection as patient was unable to answer any questions, however he can firms at this time that he has not had alcohol for least 12 hours and also has not been having any chest pain.  Does endorse upper abdominal pain and severe nausea.  Dissection study canceled, patient transported immediately to room 21.  Ativan ordered due to concern for possible developing DTs.  Attending made aware and presented to the bedside at time of transport.  This chart was dictated using voice recognition software, Dragon. Despite the best efforts of this provider to proofread and correct  errors, errors may still occur which can change documentation meaning.    Aura Dials 08/30/21 1101    Valarie Merino, MD 08/30/21 1329

## 2021-08-30 NOTE — ED Provider Notes (Signed)
[facility] DEPT Provider Note   CSN: [id number] Arrival date & time: 08/30/21  1040     History Chief Complaint  Patient presents with   Chest Pain    Kenneth Roberts is a 80 y.o. male with PMH alcohol abuse, fatty liver, previous PE, prolonged QT who presents the emergency department for evaluation of shaking and tremors.  Patient does have a history of alcohol withdrawal and has been admitted multiple times before.  He has never been admitted to the ICU or required intubation.  No history of alcoholic withdrawal seizures.  Patient states that he had at least 3 mixed drinks last night and this morning awoke with worsening abdominal pain and shaking.  He arrives hypertensive, tachycardic and tremulous.  Denies chest pain, cough, fever or congestion.   Chest Pain Associated symptoms: abdominal pain   Associated symptoms: no back pain, no cough, no fever, no palpitations, no shortness of breath and no vomiting       Past Medical History:  Diagnosis Date   Adenomatous colon polyp 01/2009   Anemia    BPH (benign prostatic hypertrophy)    Cataract    Chronic headaches    Colon polyps    Fatty liver    GERD (gastroesophageal reflux disease)    Heartburn    Hepatic hemangioma    Hiatal hernia    Hypertension    Internal hemorrhoids    Leukopenia    Pulmonary embolism [facility])     Patient Active Problem List   Diagnosis Date Noted   Alcoholic ketoacidosis 04/26/7627   Alcohol withdrawal ([place]) 01/15/2021   Prolonged QT interval 01/15/2021   Acute pulmonary embolism ([place]) 11/07/2020   Pleuritic chest pain 11/06/2020   Pulmonary embolism on right ([place]) 11/06/2020   Community acquired pneumonia 11/06/2020   Hiatal hernia 12/25/2016   Routine general medical examination at a health care facility 12/22/2015   Precordial chest pain 12/20/2015   Dyshidrotic hand dermatitis 04/13/2014   Lumbago 01/30/2014   HLD (hyperlipidemia) 07/21/2013    Hemorrhoids 04/14/2012   BPH (benign prostatic hyperplasia) 04/14/2012   PERSONAL HX COLONIC POLYPS 01/20/2011   Essential hypertension 04/27/2007   GERD 04/27/2007    Past Surgical History:  Procedure Laterality Date   CATARACT EXTRACTION     Left eye   COLONOSCOPY         Family History  Problem Relation Age of Onset   Stomach cancer Mother    Diabetes Father    Diabetes Brother    Diabetes Sister    Colon cancer Neg Hx    Colon polyps Neg Hx    Esophageal cancer Neg Hx     Social History   Tobacco Use   Smoking status: Former    Packs/day: 0.25    Years: 35.00    Pack years: 8.75    Types: Cigarettes    Quit date: 02/16/1981    Years since quitting: 40.5   Smokeless tobacco: Never  Vaping Use   Vaping Use: Never used  Substance Use Topics   Alcohol use: Yes    Comment: "been drinking a lot"   Drug use: No    Home Medications Prior to Admission medications   Medication Sig Start Date End Date Taking? Authorizing Provider  amLODipine (NORVASC) 5 MG tablet Take 1 tablet by mouth daily. 01/22/21   [provider]  apixaban (ELIQUIS) 5 MG TABS tablet Take 5 mg by mouth 2 (two) times daily. Patient states not taking. He ran  out Patient not taking: Reported on 08/30/2021    [provider]  clotrimazole-betamethasone (LOTRISONE) cream Apply 1 application topically 2 (two) times daily. 01/05/21   [provider]  dorzolamide-timolol (COSOPT) 22.3-6.8 MG/ML ophthalmic solution Place 1 drop into both eyes 2 (two) times daily. 11/28/19   [provider]  finasteride (PROSCAR) 5 MG tablet TAKE 1 TABLET (5 MG TOTAL) BY MOUTH DAILY. 11/08/20 11/08/21  Kayleen Memos, DO  gabapentin (NEURONTIN) 300 MG capsule Take 1 capsule by mouth daily. 01/22/21   [provider]  hydrOXYzine (ATARAX/VISTARIL) 50 MG tablet Take 50 mg by mouth 3 (three) times daily. 01/18/21   [provider]  losartan (COZAAR) 50 MG tablet TAKE 1 TABLET (50 MG  TOTAL) BY MOUTH DAILY. 11/08/20 11/08/21  Kayleen Memos, DO  pantoprazole (PROTONIX) 40 MG tablet Take 40 mg by mouth daily. 03/09/20   [provider]  tamsulosin (FLOMAX) 0.4 MG CAPS capsule Take 0.4 mg by mouth daily. 01/22/21   [provider]  traZODone (DESYREL) 100 MG tablet Take 100 mg by mouth at bedtime. 03/28/21   [provider]  trimethobenzamide (TIGAN) 300 MG capsule Take 1 capsule (300 mg total) by mouth 3 (three) times daily as needed for nausea/vomiting. 03/10/21   Maudie Flakes, MD    Allergies    Gadolinium derivatives and Prilosec [omeprazole]  Review of Systems   Review of Systems  Constitutional:  Negative for chills and fever.  HENT:  Negative for ear pain and sore throat.   Eyes:  Negative for pain and visual disturbance.  Respiratory:  Negative for cough and shortness of breath.   Cardiovascular:  Negative for chest pain and palpitations.  Gastrointestinal:  Positive for abdominal pain. Negative for vomiting.  Genitourinary:  Negative for dysuria and hematuria.  Musculoskeletal:  Negative for arthralgias and back pain.  Skin:  Negative for color change and rash.  Neurological:  Positive for tremors. Negative for seizures and syncope.  All other systems reviewed and are negative.  Physical Exam Updated Vital Signs BP (!) 148/87   Pulse 88   Temp 98.6 F (37 C) (Oral)   Resp (!) 21   SpO2 94%   Physical Exam Vitals and nursing note reviewed.  Constitutional:      Appearance: He is well-developed. He is ill-appearing.  HENT:     Head: Normocephalic and atraumatic.  Eyes:     Conjunctiva/sclera: Conjunctivae normal.  Cardiovascular:     Rate and Rhythm: Normal rate and regular rhythm.     Heart sounds: No murmur heard. Pulmonary:     Effort: Pulmonary effort is normal. No respiratory distress.     Breath sounds: Normal breath sounds.  Abdominal:     Palpations: Abdomen is soft.     Tenderness: There is abdominal tenderness  (Epigastric).  Musculoskeletal:     Cervical back: Neck supple.  Skin:    General: Skin is warm and dry.  Neurological:     Mental Status: He is alert.     Comments: Tremulous    ED Results / Procedures / Treatments   Labs (all labs ordered are listed, but only abnormal results are displayed) Labs Reviewed  CBC WITH DIFFERENTIAL/PLATELET - Abnormal; Notable for the following components:      Result Value   RBC 3.89 (*)    Hemoglobin 12.3 (*)    HCT 35.0 (*)    All other components within normal limits  COMPREHENSIVE METABOLIC PANEL - Abnormal; Notable for  the following components:   CO2 19 (*)    Glucose, Bld 108 (*)    Total Protein 8.4 (*)    Anion gap 20 (*)    All other components within normal limits  ETHANOL - Abnormal; Notable for the following components:   Alcohol, Ethyl (B) 116 (*)    All other components within normal limits  LACTIC ACID, PLASMA - Abnormal; Notable for the following components:   Lactic Acid, Venous 9.0 (*)    All other components within normal limits  LIPASE, BLOOD  LACTIC ACID, PLASMA  URINALYSIS, ROUTINE W REFLEX MICROSCOPIC  RAPID URINE DRUG SCREEN, HOSP PERFORMED  TROPONIN I (HIGH SENSITIVITY)    EKG None  Radiology DG Chest Portable 1 View  Result Date: 08/30/2021 CLINICAL DATA:  Shortness of breath EXAM: PORTABLE CHEST 1 VIEW COMPARISON:  02/19/2021 FINDINGS: Normal heart size and stable aortic tortuosity. Haziness at the lung bases with streaky density. There is no edema, consolidation, effusion, or pneumothorax. Artifact from EKG leads. IMPRESSION: Atelectatic type opacity at the lung bases. Electronically Signed   By: Jorje Guild M.D.   On: 08/30/2021 11:24    Procedures Procedures   Medications Ordered in ED Medications  LORazepam (ATIVAN) injection 1 mg (1 mg Intravenous Given 08/30/21 1106)    ED Course  I have reviewed the triage vital signs and the nursing notes.  Pertinent labs & imaging results that were  available during my care of the patient were reviewed by me and considered in my medical decision making (see chart for details).    MDM Rules/Calculators/A&P                           Patient seen emergency department for evaluation of abdominal pain, tremulousness and concern for alcohol drawl.  Physical exam reveals an ill-appearing tremulous patient that is tachycardic and hypertensive.  He received 1 mg IV Ativan and his symptoms improve dramatically.  Initial lab evaluation with a CO2 of 19, lactate of 9, initial high-sensitivity troponin unremarkable, hemoglobin 12.3, lipase normal at 48.  Patient's current alcohol level is 116.  Chest x-ray concerning for bilateral atelectasis of the bases.  ECG with normal sinus rhythm, evidence of LVH with a slightly prolonged QT at 523.  Patient given oral magnesium to protect against likely need for antiemetics and further QT prolongation.  Patient require admission for alcohol withdrawal.  Patient admitted. Final Clinical Impression(s) / ED Diagnoses Final diagnoses:  None    Rx / DC Orders ED Discharge Orders     None        Joshlynn Alfonzo, MD 08/30/21 1216

## 2021-08-30 NOTE — ED Triage Notes (Signed)
Per EMS-Patient was at his PCP's office today for a regular check up. PCP office called EMS for c/o abdominal pain and alcohol use. Patient states his last drink was last night. Abdomen soft. EMS states they were unable to get vital signs due to patient moving around.

## 2021-08-30 NOTE — ED Notes (Signed)
Pt having less trembling. States he had "3 or 4" mixed cocktails last night.

## 2021-08-31 DIAGNOSIS — J189 Pneumonia, unspecified organism: Secondary | ICD-10-CM

## 2021-08-31 DIAGNOSIS — F10930 Alcohol use, unspecified with withdrawal, uncomplicated: Secondary | ICD-10-CM | POA: Diagnosis not present

## 2021-08-31 DIAGNOSIS — D649 Anemia, unspecified: Secondary | ICD-10-CM

## 2021-08-31 DIAGNOSIS — U071 COVID-19: Secondary | ICD-10-CM

## 2021-08-31 LAB — MAGNESIUM: Magnesium: 1.7 mg/dL (ref 1.7–2.4)

## 2021-08-31 LAB — LACTIC ACID, PLASMA: Lactic Acid, Venous: 1.3 mmol/L (ref 0.5–1.9)

## 2021-08-31 LAB — PROTIME-INR
INR: 1 (ref 0.8–1.2)
Prothrombin Time: 12.8 seconds (ref 11.4–15.2)

## 2021-08-31 LAB — COMPREHENSIVE METABOLIC PANEL
ALT: 11 U/L (ref 0–44)
AST: 22 U/L (ref 15–41)
Albumin: 3.6 g/dL (ref 3.5–5.0)
Alkaline Phosphatase: 45 U/L (ref 38–126)
Anion gap: 16 — ABNORMAL HIGH (ref 5–15)
BUN: 11 mg/dL (ref 8–23)
CO2: 21 mmol/L — ABNORMAL LOW (ref 22–32)
Calcium: 8.7 mg/dL — ABNORMAL LOW (ref 8.9–10.3)
Chloride: 98 mmol/L (ref 98–111)
Creatinine, Ser: 0.85 mg/dL (ref 0.61–1.24)
GFR, Estimated: >60 mL/min (ref >60)
Glucose, Bld: 81 mg/dL (ref 70–99)
Potassium: 3.6 mmol/L (ref 3.5–5.1)
Sodium: 135 mmol/L (ref 135–145)
Total Bilirubin: 0.9 mg/dL (ref 0.3–1.2)
Total Protein: 7.4 g/dL (ref 6.5–8.1)

## 2021-08-31 LAB — CBC
HCT: 33 % — ABNORMAL LOW (ref 39.0–52.0)
Hemoglobin: 11.8 g/dL — ABNORMAL LOW (ref 13.0–17.0)
MCH: 31.7 pg (ref 26.0–34.0)
MCHC: 35.8 g/dL (ref 30.0–36.0)
MCV: 88.7 fL (ref 80.0–100.0)
Platelets: 167 10*3/uL (ref 150–400)
RBC: 3.72 MIL/uL — ABNORMAL LOW (ref 4.22–5.81)
RDW: 14.8 % (ref 11.5–15.5)
WBC: 4.4 10*3/uL (ref 4.0–10.5)
nRBC: 0 % (ref 0.0–0.2)

## 2021-08-31 LAB — IRON AND TIBC
Iron: 89 ug/dL (ref 45–182)
Saturation Ratios: 33 % (ref 17.9–39.5)
TIBC: 268 ug/dL (ref 250–450)
UIBC: 179 ug/dL

## 2021-08-31 LAB — MRSA NEXT GEN BY PCR, NASAL: MRSA by PCR Next Gen: NOT DETECTED

## 2021-08-31 LAB — PROCALCITONIN: Procalcitonin: <0.1 ng/mL

## 2021-08-31 LAB — CORTISOL-AM, BLOOD: Cortisol - AM: 16.7 ug/dL (ref 6.7–22.6)

## 2021-08-31 MED ORDER — ORAL CARE MOUTH RINSE
15.0000 mL | Freq: Two times a day (BID) | OROMUCOSAL | Status: DC
  Administered 2021-08-31 – 2021-09-02 (×4): 15 mL via OROMUCOSAL

## 2021-08-31 MED ORDER — OXYCODONE HCL 5 MG PO TABS
5.0000 mg | ORAL_TABLET | Freq: Four times a day (QID) | ORAL | Status: DC | PRN
Start: 2021-08-31 — End: 2021-09-02
  Administered 2021-08-31 – 2021-09-02 (×2): 5 mg via ORAL
  Filled 2021-08-31 (×2): qty 1

## 2021-08-31 MED ORDER — CHLORHEXIDINE GLUCONATE CLOTH 2 % EX PADS
6.0000 | MEDICATED_PAD | Freq: Every day | CUTANEOUS | Status: DC
  Administered 2021-09-01: 6 via TOPICAL

## 2021-08-31 MED ORDER — HYDRALAZINE HCL 25 MG PO TABS
25.0000 mg | ORAL_TABLET | ORAL | Status: DC | PRN
  Administered 2021-09-01 – 2021-09-02 (×3): 25 mg via ORAL
  Filled 2021-08-31 (×3): qty 1

## 2021-08-31 MED ORDER — LABETALOL HCL 5 MG/ML IV SOLN
10.0000 mg | INTRAVENOUS | Status: DC | PRN
  Administered 2021-08-31 – 2021-09-02 (×5): 10 mg via INTRAVENOUS
  Filled 2021-08-31 (×6): qty 4

## 2021-08-31 NOTE — Assessment & Plan Note (Addendum)
-   Confounding symptoms given probable superimposed consolidated pneumonia seen on CT.  Patient also has some GI symptoms.  Also with underlying alcohol abuse and hypertension, does have risk for progression of disease - Completed 3 days remdesivir in the hospital -No hypoxia at this time, hold off on steroids

## 2021-08-31 NOTE — Assessment & Plan Note (Addendum)
-   continue CIWA - etoh 116 on admission -Continue folic acid, thiamine, multivitamin

## 2021-08-31 NOTE — Progress Notes (Signed)
Progress Note    Kenneth Roberts   YHC:[id number]  DOB: 04-Mar-1941  DOA: 08/30/2021     1 Date of Service: 08/31/2021   Clinical Course Kenneth Roberts is a 80 yo male with PMH alcohol use, BPH, GERD, HTN, chronic abdominal pain who presented with chills, shaking, and ongoing abdominal pain.  He endorsed nausea but no vomiting on admission.  He was recommended to come to the hospital after discussing with his GI office on the phone. He was noted to be tremulous. His EtOH level was 116. Drug screen was negative. His lactic acid was 9. He was given ativan and fluids. CT ab/pelvis showed LLL consolidation but no other acute abnormalities. COVID-19 testing was also positive on admission. He was started on antibiotics for presumed pneumonia and remdesivir for COVID-19 treatment.  He was not hypoxic and did not warrant initiation of steroids.  Assessment and Plan * Community acquired pneumonia - Left lower lobe consolidation noted on CT abdomen/pelvis - Continue Rocephin - Holding off on azithromycin due to prolonged QTC  COVID-19 virus infection - Confounding symptoms given probable superimposed consolidated pneumonia seen on CT.  Patient also has some GI symptoms.  Also with underlying alcohol abuse and hypertension, does have risk for progression of disease - Continue remdesivir - Trend inflammatory markers and CMP -No hypoxia at this time, hold off on steroids  Alcohol withdrawal (HCC) - continue CIWA - etoh 116 on admission -Continue folic acid, thiamine, multivitamin  Normocytic anemia - Iron stores adequate  Essential hypertension - Continue losartan     Subjective:  Resting comfortably in bed when seen in the ER this morning.  Very soft-spoken but appeared comfortable and in no distress.  Objective Vitals:   08/31/21 0715 08/31/21 1000 08/31/21 1156 08/31/21 1157  BP: (!) 151/108 137/76 (!) 154/95   Pulse: 75 86 80   Resp: 17 18 17    Temp:    98.5 F (36.9 C)   TempSrc:    Oral  SpO2: 96% 98% 98%       Vital signs were reviewed and unremarkable.   Exam Physical Exam Constitutional:      Appearance: He is well-developed.  HENT:     Head: Normocephalic and atraumatic.     Mouth/Throat:     Mouth: Mucous membranes are moist.  Eyes:     Extraocular Movements: Extraocular movements intact.  Cardiovascular:     Rate and Rhythm: Normal rate and regular rhythm.     Heart sounds: Normal heart sounds.  Pulmonary:     Effort: Pulmonary effort is normal.     Breath sounds: Normal breath sounds.  Abdominal:     General: Bowel sounds are normal. There is no distension.     Palpations: Abdomen is soft.     Tenderness: There is no abdominal tenderness.  Musculoskeletal:        General: Normal range of motion.     Cervical back: Normal range of motion and neck supple.  Skin:    General: Skin is warm and dry.  Neurological:     General: No focal deficit present.     Mental Status: He is alert.  Psychiatric:        Mood and Affect: Mood normal.        Behavior: Behavior normal.     Labs / Other Information My review of labs, imaging, notes and other tests shows no new significant findings.    Disposition Plan: Status is: Inpatient  Remains inpatient appropriate because:  Ongoing treatment of above     Time spent: Greater than 50% of the 35 minute visit was spent in counseling/coordination of care for the patient as laid out in the A&P.  Kenneth Dee, MD [facility] 08/31/2021, 12:42 PM

## 2021-08-31 NOTE — Assessment & Plan Note (Signed)
Continue losartan. 

## 2021-08-31 NOTE — Assessment & Plan Note (Signed)
-   Iron stores adequate

## 2021-08-31 NOTE — Assessment & Plan Note (Addendum)
-   Left lower lobe consolidation noted on CT abdomen/pelvis - Continue Rocephin - Holding off on azithromycin due to prolonged QTC -Discharged with Augmentin to complete course

## 2021-08-31 NOTE — Hospital Course (Addendum)
Kenneth Roberts is a 80 yo male with PMH alcohol use, BPH, GERD, HTN, chronic abdominal pain who presented with chills, shaking, and ongoing abdominal pain.  He endorsed nausea but no vomiting on admission.  He was recommended to come to the hospital after discussing with his GI office on the phone. He was noted to be tremulous. His EtOH level was 116. Drug screen was negative. His lactic acid was 9. He was given ativan and fluids. CT ab/pelvis showed LLL consolidation but no other acute abnormalities. COVID-19 testing was also positive on admission. He was started on antibiotics for presumed pneumonia and remdesivir for COVID-19 treatment.  He was not hypoxic and did not warrant initiation of steroids. See below for further A&P.

## 2021-09-01 DIAGNOSIS — U071 COVID-19: Secondary | ICD-10-CM | POA: Diagnosis not present

## 2021-09-01 DIAGNOSIS — F10930 Alcohol use, unspecified with withdrawal, uncomplicated: Secondary | ICD-10-CM | POA: Diagnosis not present

## 2021-09-01 DIAGNOSIS — J189 Pneumonia, unspecified organism: Secondary | ICD-10-CM | POA: Diagnosis not present

## 2021-09-01 MED ORDER — DIPHENHYDRAMINE HCL 25 MG PO CAPS
25.0000 mg | ORAL_CAPSULE | Freq: Once | ORAL | Status: AC
  Administered 2021-09-01: 25 mg via ORAL
  Filled 2021-09-01: qty 1

## 2021-09-01 MED ORDER — ATROPINE SULFATE 1 MG/10ML IJ SOSY
PREFILLED_SYRINGE | INTRAMUSCULAR | Status: AC
  Filled 2021-09-01: qty 10

## 2021-09-01 NOTE — Progress Notes (Signed)
Patient's bedside monitor alarming HR trending down to 24 bpm. When RN entered patient's room, pt was using his urinal and was fully alert. BP WNL. Dr Lamonte Sakai notified by RN, and atropine in patient's room. This RN will continue to carefully monitor patient's cardiac monitor.

## 2021-09-01 NOTE — Progress Notes (Signed)
Progress Note    Kenneth Roberts   SVX:[id number]  DOB: [birth year]  DOA: 08/30/2021     2 Date of Service: 09/01/2021   Clinical Course Kenneth Roberts is a 80 yo male with PMH alcohol use, BPH, GERD, HTN, chronic abdominal pain who presented with chills, shaking, and ongoing abdominal pain.  He endorsed nausea but no vomiting on admission.  He was recommended to come to the hospital after discussing with his GI office on the phone. He was noted to be tremulous. His EtOH level was 116. Drug screen was negative. His lactic acid was 9. He was given ativan and fluids. CT ab/pelvis showed LLL consolidation but no other acute abnormalities. COVID-19 testing was also positive on admission. He was started on antibiotics for presumed pneumonia and remdesivir for COVID-19 treatment.  He was not hypoxic and did not warrant initiation of steroids.   Assessment and Plan * Community acquired pneumonia - Left lower lobe consolidation noted on CT abdomen/pelvis - Continue Rocephin - Holding off on azithromycin due to prolonged QTC  COVID-19 virus infection - Confounding symptoms given probable superimposed consolidated pneumonia seen on CT.  Patient also has some GI symptoms.  Also with underlying alcohol abuse and hypertension, does have risk for progression of disease - Continue remdesivir - Trend inflammatory markers and CMP -No hypoxia at this time, hold off on steroids  Alcohol withdrawal (HCC) - continue CIWA - etoh 116 on admission -Continue folic acid, thiamine, multivitamin  Normocytic anemia - Iron stores adequate  Essential hypertension - Continue losartan    Subjective:  No events overnight.  Still requiring some Ativan for elevated CIWA scores.  Objective Vitals:   09/01/21 1000 09/01/21 1100 09/01/21 1200 09/01/21 1242  BP: (!) 154/89 (!) 160/109  (!) 179/136  Pulse: 84 87 91 76  Resp: 13 18 (!) 21 (!) 22  Temp:      TempSrc:      SpO2: 97% 95% 95% 98%      Vital  signs were reviewed and unremarkable.   Exam Physical Exam Constitutional:      Appearance: He is well-developed.  HENT:     Head: Normocephalic and atraumatic.     Mouth/Throat:     Mouth: Mucous membranes are moist.  Eyes:     Extraocular Movements: Extraocular movements intact.  Cardiovascular:     Rate and Rhythm: Normal rate and regular rhythm.     Heart sounds: Normal heart sounds.  Pulmonary:     Effort: Pulmonary effort is normal.     Breath sounds: Normal breath sounds.  Abdominal:     General: Bowel sounds are normal. There is no distension.     Palpations: Abdomen is soft.     Tenderness: There is no abdominal tenderness.  Musculoskeletal:        General: Normal range of motion.     Cervical back: Normal range of motion and neck supple.  Skin:    General: Skin is warm and dry.  Neurological:     General: No focal deficit present.     Mental Status: He is alert.  Psychiatric:        Mood and Affect: Mood normal.        Behavior: Behavior normal.     Labs / Other Information My review of labs, imaging, notes and other tests shows no new significant findings.    Disposition Plan: Status is: Inpatient  Remains inpatient appropriate because: Ongoing treatment of above     Time  spent: Greater than 50% of the 35 minute visit was spent in counseling/coordination of care for the patient as laid out in the A&P.  Dwyane Dee, MD [facility] 09/01/2021, 1:04 PM

## 2021-09-01 NOTE — Progress Notes (Signed)
Patient refusing lab draws. Patient was educated on the necessity for blood draws for delivery of care. Patient still refuses. Pt is Alert and oriented x4. He complains about the number of  sticks. MD made aware.

## 2021-09-02 DIAGNOSIS — F10930 Alcohol use, unspecified with withdrawal, uncomplicated: Secondary | ICD-10-CM | POA: Diagnosis not present

## 2021-09-02 DIAGNOSIS — U071 COVID-19: Secondary | ICD-10-CM | POA: Diagnosis not present

## 2021-09-02 DIAGNOSIS — J189 Pneumonia, unspecified organism: Secondary | ICD-10-CM | POA: Diagnosis not present

## 2021-09-02 MED ORDER — ONDANSETRON HCL 4 MG/2ML IJ SOLN
4.0000 mg | Freq: Once | INTRAMUSCULAR | Status: AC
  Administered 2021-09-02: 4 mg via INTRAVENOUS
  Filled 2021-09-02: qty 2

## 2021-09-02 MED ORDER — AMOXICILLIN-POT CLAVULANATE 875-125 MG PO TABS
1.0000 | ORAL_TABLET | Freq: Two times a day (BID) | ORAL | Status: DC
  Administered 2021-09-02: 1 via ORAL
  Filled 2021-09-02: qty 1

## 2021-09-02 MED ORDER — HYDROXYZINE HCL 25 MG PO TABS
25.0000 mg | ORAL_TABLET | Freq: Once | ORAL | Status: AC
  Administered 2021-09-02: 25 mg via ORAL
  Filled 2021-09-02: qty 1

## 2021-09-02 MED ORDER — AMOXICILLIN-POT CLAVULANATE 875-125 MG PO TABS: 1.0000 | ORAL_TABLET | Freq: Two times a day (BID) | ORAL | 0 refills | Status: AC

## 2021-09-02 NOTE — Progress Notes (Signed)
Patient left ICU alert by wheelchair with this RN and Diane, NT. Discharged home with his brother driving him.

## 2021-09-02 NOTE — Progress Notes (Signed)
Sent Dr. Sabino Gasser a secure chat and made MD aware of BP of 185/114 and that patient is still pending discharge waiting for a ride home. MD gave order to go ahead and give PO hydralazine PRN order and to not give labetalol first as previously ordered per PRN orders.

## 2021-09-02 NOTE — Discharge Instructions (Signed)
Outpatient Substance Use Treatment Services   [facility] [address] [place], [place] [address]  [phone] Private insurance, Medicare A&B, and GCCN   [facility]  [address]  [place], [place] [address] [phone] Medicaid, Self Pay   [facility]      [address]  [place], [place] [phone] Medicaid and Private Insurance, Self Pay   [facility] [address]  [place], [place]  [phone] Private Insurance, and Self Pay  [facility]      [address]    [place], [place] [address]  [phone] or [phone] Private Insurance Only   [facility] [address]  [place], [place] [address] [phone] Medicaid, Medicare, Private Insurance  [facility] [address]  [place], [place] [address] [phone] Services are free or reduced  [facility]  [address] [phone]  Self Pay only, sliding scale  [facility]  [address]  [place], [place] [address] [phone] ([facility]) Self Pay, Medicaid Only   [facility] [address]  [place], [place] [address] [phone] Medicaid, Medicare, Private Insurance  Residential Substance Use Treatment Services   [facility]  [address]  [place], [place] [address]  [phone] or [phone] Detox (Medicare, Medicaid, private insurance, and self pay)  Residential Rehab 14 days (Medicare, Medicaid, private insurance, and self pay)   [facility]  [address] [place], [place]  [phone]  Male and Male Detox (Self Pay and Medicaid limited availability)  Rehab only Male (Medicaid and self pay only)   [facility]      [address]   [place], [place] [address]  [phone] or [phone] Detox and Residential Treatment Private Insurance Only   [facility]  [address]  [place], [place] [address]  [phone]  Treatment Only, must make assessment appointment, and must be sober for assessment appointment.  Self Pay Only, Medicare A&B, [place] Medicaid, [place] ID only! *Transportation assistance offered from [employer] [address]  [facility]     [address] [place], [place] [address] Walk in interviews M-Sat 8-4p No pending legal charges [phone]  [facility]:  [facility]  [address] [place], [place] [phone] (Self Pay, Medicaid)  [facility] [address] [place], [place] [address] [phone] Detox and Residential Treatment Medicare and Private Insurance  [facility] [address]  [place], [place] [address] 28 Day Women's Facility: [phone] 28 Day Men's Facility: [phone] Long-term Residential Program:  [phone] Males 25 and Over (No Insurance, upfront fee)  [facility]  [address] [place], [place] [address] [phone] Private Insurance with [employer], Private Pay  [facility] [address] [place], [place] [address] Local [phone] Private Insurance Only  [facility] [address]  [place], [place] [address]  [phone] (Males, upfront fee)  [facility] [place] [address]  [place], [address] [phone] Private Insurance   [facility] Locations  [facility]  [address]  [place], [place]  [phone] Christian Based Program for individuals experiencing homelessness Self Pay, No insurance  [facility]  Men's program: [facility] [address]  [place], [place] [address] [phone]  [facility]: [facility] [address]   [place], [place] [address] [phone] Christian Based Program for individuals  experiencing homelessness Self Pay, No insurance  [facility] [address]  [place], [place] [address]  [phone] Christian Based Program for individuals experiencing homelessness Self Pay, No insurance  [facility] [address]  [place], [place] [address] [phone] Christian Based Program for individuals experiencing homelessness Self Pay, No insurance  [facility] [address] [place], [place] [phone] Christian Based Program for males experiencing homelessness Self Pay, No insurance                    

## 2021-09-02 NOTE — Progress Notes (Addendum)
Patient refuses AM labs. Patient was educated on the need for labs to make safe decisions with care. Patient acknowledges the education and teaching but still refuses. MDs aware of pt refusals. Will monitor patient for the need of intervention.

## 2021-09-02 NOTE — TOC Transition Note (Signed)
Transition of Care [facility]) - CM/SW Discharge Note   Patient Details  Name: Kenneth Roberts MRN: [id number] Date of Birth: 1940-11-11  Transition of Care [facility]) CM/SW Contact:  Ross Ludwig, LCSW Phone Number: 09/02/2021, 9:16 AM   Clinical Narrative:     CSW received consult that patient needs substance abuse resources.  CSW added substance abuse resources to patient's AVS.  CSW signing off.   Final next level of care: Home/Self Care Barriers to Discharge: Barriers Resolved   Patient Goals and CMS Choice Patient states their goals for this hospitalization and ongoing recovery are:: To return back home.      Discharge Placement                       Discharge Plan and Services                                     Social Determinants of Health (SDOH) Interventions     Readmission Risk Interventions No flowsheet data found.

## 2021-09-02 NOTE — Progress Notes (Signed)
Oral hydralazine given and patient now states that his brother is on the way to pick him up to take him home and he can't wait. RN sent Dr. Sabino Gasser secure chat and asked if patient has to wait to be discharged for BP re-check. Dr. Sabino Gasser stated that patient can be discharged without waiting to re-check blood pressure but to inform patient he needs to see his PCP for follow up care. RN instructed patient to follow up with his primary care doctor and patient verbalized understanding. Discharge instructions given to and reviewed with patient. Patient verbalized understanding of all discharge instructions including to follow up with his primary care doctor, changes to home medications and new prescription.

## 2021-09-02 NOTE — Discharge Summary (Signed)
Physician Discharge Summary   Patient name: Kenneth Roberts  Admit date:     08/30/2021  Discharge date: 09/02/2021  Discharge Physician: Dwyane Dee   PCP: Sonia Side., FNP   Recommendations at discharge:  Continue routine care  Discharge Diagnoses Principal Problem:   Community acquired pneumonia Active Problems:   COVID-19 virus infection   Alcohol withdrawal ([place])   Essential hypertension   Normocytic anemia   Resolved Diagnoses Resolved Problems:   * No resolved hospital problems. [facility] Course   [name] is a 80 yo male with PMH alcohol use, BPH, GERD, HTN, chronic abdominal pain who presented with chills, shaking, and ongoing abdominal pain.  He endorsed nausea but no vomiting on admission.  He was recommended to come to the hospital after discussing with his GI office on the phone. He was noted to be tremulous. His EtOH level was 116. Drug screen was negative. His lactic acid was 9. He was given ativan and fluids. CT ab/pelvis showed LLL consolidation but no other acute abnormalities. COVID-19 testing was also positive on admission. He was started on antibiotics for presumed pneumonia and remdesivir for COVID-19 treatment.  He was not hypoxic and did not warrant initiation of steroids. See below for further A&P.   * Community acquired pneumonia - Left lower lobe consolidation noted on CT abdomen/pelvis - Continue Rocephin - Holding off on azithromycin due to prolonged QTC -Discharged with Augmentin to complete course  COVID-19 virus infection - Confounding symptoms given probable superimposed consolidated pneumonia seen on CT.  Patient also has some GI symptoms.  Also with underlying alcohol abuse and hypertension, does have risk for progression of disease - Completed 3 days remdesivir in the hospital -No hypoxia at this time, hold off on steroids  Alcohol withdrawal ([place]) - continue CIWA - etoh 116 on admission -Continue folic acid,  thiamine, multivitamin  Normocytic anemia - Iron stores adequate  Essential hypertension - Continue losartan      Condition at discharge: stable  Exam Physical Exam Constitutional:      Appearance: He is well-developed.  HENT:     Head: Normocephalic and atraumatic.     Mouth/Throat:     Mouth: Mucous membranes are moist.  Eyes:     Extraocular Movements: Extraocular movements intact.  Cardiovascular:     Rate and Rhythm: Normal rate and regular rhythm.     Heart sounds: Normal heart sounds.  Pulmonary:     Effort: Pulmonary effort is normal.     Breath sounds: Normal breath sounds.  Abdominal:     General: Bowel sounds are normal. There is no distension.     Palpations: Abdomen is soft.     Tenderness: There is no abdominal tenderness.  Musculoskeletal:        General: Normal range of motion.     Cervical back: Normal range of motion and neck supple.  Skin:    General: Skin is warm and dry.  Neurological:     General: No focal deficit present.     Mental Status: He is alert.  Psychiatric:        Mood and Affect: Mood normal.        Behavior: Behavior normal.     Disposition: Home  Discharge time: greater than 30 minutes.   Allergies as of 09/02/2021       Reactions   Gadolinium Derivatives Other (See Comments)   unknown   Prilosec [omeprazole] Itching  Medication List     STOP taking these medications    clotrimazole-betamethasone cream Commonly known as: LOTRISONE   finasteride 5 MG tablet Commonly known as: PROSCAR   hydrOXYzine 50 MG tablet Commonly known as: ATARAX/VISTARIL   traZODone 100 MG tablet Commonly known as: DESYREL   trimethobenzamide 300 MG capsule Commonly known as: Tigan       TAKE these medications    amoxicillin-clavulanate 875-125 MG tablet Commonly known as: AUGMENTIN Take 1 tablet by mouth every 12 (twelve) hours for 2 days.   dorzolamide-timolol 22.3-6.8 MG/ML ophthalmic solution Commonly known  as: COSOPT Place 1 drop into both eyes 2 (two) times daily.   losartan 50 MG tablet Commonly known as: COZAAR TAKE 1 TABLET (50 MG TOTAL) BY MOUTH DAILY. What changed: how much to take   pantoprazole 40 MG tablet Commonly known as: PROTONIX Take 40 mg by mouth daily.   tamsulosin 0.4 MG Caps capsule Commonly known as: FLOMAX Take 0.4 mg by mouth daily.        CT ABDOMEN PELVIS W CONTRAST  Result Date: 08/30/2021 CLINICAL DATA:  Epigastric abdominal pain, concern for bowel ischemia, elevated lactate EXAM: CT ABDOMEN AND PELVIS WITH CONTRAST TECHNIQUE: Multidetector CT imaging of the abdomen and pelvis was performed using the standard protocol following bolus administration of intravenous contrast. CONTRAST:  89mL OMNIPAQUE IOHEXOL 350 MG/ML SOLN COMPARISON:  CT abdomen/pelvis 03/09/2021 FINDINGS: Lower chest: There are patchy and linear opacities in the lung bases. There is consolidative opacity in the anterior left base. The imaged heart is unremarkable. Hepatobiliary: The liver is hypoattenuating suggesting fatty infiltration. There is no defined focus of nodular enhancement in the left hepatic lobe measuring approximately 1.5 cm. This lesion has been present since 2017 and most likely reflects a hemangioma. There are no new focal lesions. The gallbladder is unremarkable. There is no biliary ductal dilatation. Pancreas: Unremarkable. Spleen: Unremarkable. Adrenals/Urinary Tract: The adrenals are unremarkable. A tiny hypodense lesion in the left kidney is unchanged, too small to characterize but likely a small cyst. There are no other focal lesions. There are no stones. There is no hydronephrosis or hydroureter. The bladder is decompressed but grossly unremarkable. Stomach/Bowel: The stomach is unremarkable. There is no evidence of bowel obstruction. There is no abnormal bowel wall thickening or inflammatory change. There is no pneumatosis intestinalis. The appendix is normal.  Vascular/Lymphatic: The abdominal aorta is nonaneurysmal. The celiac artery, SMA, and IMA are patent. The SMV and main portal and splenic veins are patent. There is no portal venous gas. There is no abdominal or pelvic lymphadenopathy. Reproductive: The prostate and seminal vesicles are unremarkable. Other: There is no ascites or free air. Musculoskeletal: A sclerotic lesion in the left iliac wing is unchanged. There is no acute osseous abnormality or suspicious osseous lesion. IMPRESSION: 1. No acute finding in the abdomen or pelvis. No evidence of bowel ischemia. Patent aorta and major branch vessels. 2. Fatty liver. 3. Patchy opacities in the lung bases probably predominantly reflect atelectasis, but there is a more consolidative appearing opacity in the anterior portion of the left lower lobe which is suspicious for pneumonia. Electronically Signed   By: Valetta Mole M.D.   On: 08/30/2021 13:34   DG Chest Portable 1 View  Result Date: 08/30/2021 CLINICAL DATA:  Shortness of breath EXAM: PORTABLE CHEST 1 VIEW COMPARISON:  02/19/2021 FINDINGS: Normal heart size and stable aortic tortuosity. Haziness at the lung bases with streaky density. There is no edema, consolidation, effusion, or pneumothorax. Artifact  from EKG leads. IMPRESSION: Atelectatic type opacity at the lung bases. Electronically Signed   By: Jorje Guild M.D.   On: 08/30/2021 11:24   Results for orders placed or performed during the hospital encounter of 08/30/21  Resp Panel by RT-PCR (Flu A&B, Covid) Nasopharyngeal Swab     Status: Abnormal   Collection Time: 08/30/21  1:58 PM   Specimen: Nasopharyngeal Swab; Nasopharyngeal(NP) swabs in vial transport medium  Result Value Ref Range Status   SARS Coronavirus 2 by RT PCR POSITIVE (A) NEGATIVE Final    Comment: RESULT CALLED TO, READ BACK BY AND VERIFIED WITH: MCIVER,M. RN @1657  ON 08/30/2021 BY COHEN,K (NOTE) SARS-CoV-2 target nucleic acids are DETECTED.  The SARS-CoV-2 RNA is  generally detectable in upper respiratory specimens during the acute phase of infection. Positive results are indicative of the presence of the identified virus, but do not rule out bacterial infection or co-infection with other pathogens not detected by the test. Clinical correlation with patient history and other diagnostic information is necessary to determine patient infection status. The expected result is Negative.  Fact Sheet for Patients: EntrepreneurPulse.com.au  Fact Sheet for Healthcare Providers: IncredibleEmployment.be  This test is not yet approved or cleared by the [place] FDA and  has been authorized for detection and/or diagnosis of SARS-CoV-2 by FDA under an Emergency Use Authorization (EUA).  This EUA will remain in effect (meaning this t est can be used) for the duration of  the COVID-19 declaration under Section 564(b)(1) of the Act, 21 U.S.C. section 360bbb-3(b)(1), unless the authorization is terminated or revoked sooner.     Influenza A by PCR NEGATIVE NEGATIVE Final   Influenza B by PCR NEGATIVE NEGATIVE Final    Comment: (NOTE) The Xpert Xpress SARS-CoV-2/FLU/RSV plus assay is intended as an aid in the diagnosis of influenza from Nasopharyngeal swab specimens and should not be used as a sole basis for treatment. Nasal washings and aspirates are unacceptable for Xpert Xpress SARS-CoV-2/FLU/RSV testing.  Fact Sheet for Patients: EntrepreneurPulse.com.au  Fact Sheet for Healthcare Providers: IncredibleEmployment.be  This test is not yet approved or cleared by the [place] FDA and has been authorized for detection and/or diagnosis of SARS-CoV-2 by FDA under an Emergency Use Authorization (EUA). This EUA will remain in effect (meaning this test can be used) for the duration of the COVID-19 declaration under Section 564(b)(1) of the Act, 21 U.S.C. section 360bbb-3(b)(1),  unless the authorization is terminated or revoked.  Performed at [facility], [place] [address], [place] [address]   MRSA Next Gen by PCR, Nasal     Status: None   Collection Time: 08/31/21  1:20 PM   Specimen: Nasal Mucosa; Nasal Swab  Result Value Ref Range Status   MRSA by PCR Next Gen NOT DETECTED NOT DETECTED Final    Comment: (NOTE) The GeneXpert MRSA Assay (FDA approved for NASAL specimens only), is one component of a comprehensive MRSA colonization surveillance program. It is not intended to diagnose MRSA infection nor to guide or monitor treatment for MRSA infections. Test performance is not FDA approved in patients less than 5 years old. Performed at [facility], [place] [address], [place], [place] [address]     Signed:  Dwyane Dee MD.  [facility] 09/02/2021, 1:55 PM

## 2021-09-02 NOTE — Progress Notes (Signed)
Sent Dr. Sabino Gasser secure chat and made MD aware that patient having nausea after taking medication on empty stomach. Patient thought that he would be okay to take meds but now feeling nauseous. MD stated he would order zofran.

## 2021-10-11 ENCOUNTER — Ambulatory Visit (INDEPENDENT_AMBULATORY_CARE_PROVIDER_SITE_OTHER): Payer: Medicare Other | Admitting: Podiatry

## 2021-10-11 ENCOUNTER — Other Ambulatory Visit: Payer: Self-pay

## 2021-10-11 ENCOUNTER — Encounter: Payer: Self-pay | Admitting: Podiatry

## 2021-10-11 DIAGNOSIS — B351 Tinea unguium: Secondary | ICD-10-CM

## 2021-10-11 DIAGNOSIS — M79676 Pain in unspecified toe(s): Secondary | ICD-10-CM | POA: Diagnosis not present

## 2021-10-11 DIAGNOSIS — L84 Corns and callosities: Secondary | ICD-10-CM

## 2021-10-11 DIAGNOSIS — M216X9 Other acquired deformities of unspecified foot: Secondary | ICD-10-CM

## 2021-10-11 NOTE — Progress Notes (Signed)
This patient returns to the office for evaluation and treatment of long thick painful nails .  This patient is unable to trim his own nails since the patient cannot reach his feet.  Patient says the nails are painful walking and wearing his shoes.    He returns for preventive foot care services.  General Appearance  Alert, conversant and in no acute stress.  Vascular  Dorsalis pedis and posterior tibial  pulses are palpable  bilaterally.  Capillary return is within normal limits  bilaterally. Temperature is within normal limits  bilaterally.  Neurologic  Senn-Weinstein monofilament wire test within normal limits  bilaterally. Muscle power within normal limits bilaterally.  Nails Thick disfigured discolored nails with subungual debris  from hallux to fifth toes bilaterally. No evidence of bacterial infection or drainage bilaterally.  Orthopedic  No limitations of motion  feet .  No crepitus or effusions noted.  No bony pathology or digital deformities noted.  Prominent fifth metatarsal  B/L. Above ankle swelling  B/L.  Skin  normotropic skin with no porokeratosis noted bilaterally.  No signs of infections or ulcers noted.  Symptomatic callus sub 5th  B/L.  Onychomycosis  Pain in toes right foot  Pain in toes left foot  Debridement  of nails  1-5  B/L with a nail nipper.  Nails were then filed using a dremel tool with no incidents.  Marland Kitchen   RTC  10 weeks   Gardiner Barefoot DPM

## 2021-10-21 ENCOUNTER — Other Ambulatory Visit: Payer: Self-pay | Admitting: Internal Medicine

## 2021-10-21 DIAGNOSIS — K869 Disease of pancreas, unspecified: Secondary | ICD-10-CM

## 2021-10-26 ENCOUNTER — Encounter (HOSPITAL_COMMUNITY): Payer: Self-pay

## 2021-10-26 ENCOUNTER — Observation Stay (HOSPITAL_COMMUNITY)
Admission: EM | Admit: 2021-10-26 | Discharge: 2021-10-27 | Disposition: A | Discharge status: Home / Self Care | Payer: Medicare Other | Attending: Internal Medicine | Admitting: Internal Medicine

## 2021-10-26 ENCOUNTER — Emergency Department (HOSPITAL_COMMUNITY): Payer: Medicare Other

## 2021-10-26 DIAGNOSIS — J9601 Acute respiratory failure with hypoxia: Secondary | ICD-10-CM | POA: Diagnosis not present

## 2021-10-26 DIAGNOSIS — N390 Urinary tract infection, site not specified: Secondary | ICD-10-CM | POA: Diagnosis not present

## 2021-10-26 DIAGNOSIS — R1033 Periumbilical pain: Secondary | ICD-10-CM | POA: Diagnosis present

## 2021-10-26 DIAGNOSIS — N3090 Cystitis, unspecified without hematuria: Secondary | ICD-10-CM | POA: Diagnosis not present

## 2021-10-26 DIAGNOSIS — I10 Essential (primary) hypertension: Secondary | ICD-10-CM | POA: Insufficient documentation

## 2021-10-26 DIAGNOSIS — F10930 Alcohol use, unspecified with withdrawal, uncomplicated: Secondary | ICD-10-CM

## 2021-10-26 DIAGNOSIS — F10939 Alcohol use, unspecified with withdrawal, unspecified: Secondary | ICD-10-CM | POA: Diagnosis present

## 2021-10-26 DIAGNOSIS — F10239 Alcohol dependence with withdrawal, unspecified: Secondary | ICD-10-CM | POA: Diagnosis not present

## 2021-10-26 DIAGNOSIS — Z20822 Contact with and (suspected) exposure to covid-19: Secondary | ICD-10-CM | POA: Diagnosis not present

## 2021-10-26 DIAGNOSIS — Z87891 Personal history of nicotine dependence: Secondary | ICD-10-CM | POA: Diagnosis not present

## 2021-10-26 DIAGNOSIS — K2100 Gastro-esophageal reflux disease with esophagitis, without bleeding: Secondary | ICD-10-CM

## 2021-10-26 DIAGNOSIS — R0902 Hypoxemia: Secondary | ICD-10-CM | POA: Diagnosis present

## 2021-10-26 DIAGNOSIS — Z79899 Other long term (current) drug therapy: Secondary | ICD-10-CM | POA: Diagnosis not present

## 2021-10-26 DIAGNOSIS — Y900 Blood alcohol level of less than 20 mg/100 ml: Secondary | ICD-10-CM | POA: Insufficient documentation

## 2021-10-26 DIAGNOSIS — N400 Enlarged prostate without lower urinary tract symptoms: Secondary | ICD-10-CM | POA: Diagnosis present

## 2021-10-26 DIAGNOSIS — K219 Gastro-esophageal reflux disease without esophagitis: Secondary | ICD-10-CM | POA: Diagnosis present

## 2021-10-26 DIAGNOSIS — R4182 Altered mental status, unspecified: Secondary | ICD-10-CM | POA: Diagnosis not present

## 2021-10-26 LAB — RESP PANEL BY RT-PCR (FLU A&B, COVID) ARPGX2
Influenza A by PCR: NEGATIVE
Influenza B by PCR: NEGATIVE
SARS Coronavirus 2 by RT PCR: NEGATIVE

## 2021-10-26 LAB — COMPREHENSIVE METABOLIC PANEL
ALT: 17 U/L (ref 0–44)
AST: 40 U/L (ref 15–41)
Albumin: 4.3 g/dL (ref 3.5–5.0)
Alkaline Phosphatase: 41 U/L (ref 38–126)
Anion gap: 15 (ref 5–15)
BUN: 12 mg/dL (ref 8–23)
CO2: 15 mmol/L — ABNORMAL LOW (ref 22–32)
Calcium: 9.4 mg/dL (ref 8.9–10.3)
Chloride: 105 mmol/L (ref 98–111)
Creatinine, Ser: 0.99 mg/dL (ref 0.61–1.24)
GFR, Estimated: >60 mL/min (ref >60)
Glucose, Bld: 93 mg/dL (ref 70–99)
Potassium: 3.9 mmol/L (ref 3.5–5.1)
Sodium: 135 mmol/L (ref 135–145)
Total Bilirubin: 1.4 mg/dL — ABNORMAL HIGH (ref 0.3–1.2)
Total Protein: 8.1 g/dL (ref 6.5–8.1)

## 2021-10-26 LAB — CBC WITH DIFFERENTIAL/PLATELET
Abs Immature Granulocytes: 0.01 10*3/uL (ref 0.00–0.07)
Basophils Absolute: 0.1 10*3/uL (ref 0.0–0.1)
Basophils Relative: 1 %
Eosinophils Absolute: 0 10*3/uL (ref 0.0–0.5)
Eosinophils Relative: 0 %
HCT: 36.2 % — ABNORMAL LOW (ref 39.0–52.0)
Hemoglobin: 12.9 g/dL — ABNORMAL LOW (ref 13.0–17.0)
Immature Granulocytes: 0 %
Lymphocytes Relative: 19 %
Lymphs Abs: 0.9 10*3/uL (ref 0.7–4.0)
MCH: 30.6 pg (ref 26.0–34.0)
MCHC: 35.6 g/dL (ref 30.0–36.0)
MCV: 85.8 fL (ref 80.0–100.0)
Monocytes Absolute: 0.5 10*3/uL (ref 0.1–1.0)
Monocytes Relative: 10 %
Neutro Abs: 3.3 10*3/uL (ref 1.7–7.7)
Neutrophils Relative %: 70 %
Platelets: 225 10*3/uL (ref 150–400)
RBC: 4.22 MIL/uL (ref 4.22–5.81)
RDW: 16.3 % — ABNORMAL HIGH (ref 11.5–15.5)
WBC: 4.7 10*3/uL (ref 4.0–10.5)
nRBC: 0 % (ref 0.0–0.2)

## 2021-10-26 LAB — ETHANOL: Alcohol, Ethyl (B): <10 mg/dL (ref <10)

## 2021-10-26 LAB — TROPONIN I (HIGH SENSITIVITY)
Troponin I (High Sensitivity): 3 ng/L (ref <18)
Troponin I (High Sensitivity): 3 ng/L (ref <18)

## 2021-10-26 LAB — LIPASE, BLOOD: Lipase: 51 U/L (ref 11–51)

## 2021-10-26 LAB — MAGNESIUM: Magnesium: 1.7 mg/dL (ref 1.7–2.4)

## 2021-10-26 MED ORDER — LOSARTAN POTASSIUM 50 MG PO TABS
50.0000 mg | ORAL_TABLET | Freq: Every day | ORAL | Status: DC
  Administered 2021-10-27: 11:00:00 50 mg via ORAL
  Filled 2021-10-26: qty 1

## 2021-10-26 MED ORDER — SODIUM CHLORIDE 0.9 % IV SOLN
1.0000 g | INTRAVENOUS | Status: DC
  Administered 2021-10-26: 23:00:00 1 g via INTRAVENOUS
  Filled 2021-10-26: qty 10

## 2021-10-26 MED ORDER — ACETAMINOPHEN 650 MG RE SUPP: 650.0000 mg | Freq: Four times a day (QID) | RECTAL | Status: DC | PRN

## 2021-10-26 MED ORDER — ONDANSETRON HCL 4 MG PO TABS: 4.0000 mg | ORAL_TABLET | Freq: Four times a day (QID) | ORAL | Status: DC | PRN

## 2021-10-26 MED ORDER — DEXTROSE-NACL 5-0.9 % IV SOLN: INTRAVENOUS | Status: DC

## 2021-10-26 MED ORDER — LORAZEPAM 2 MG/ML IJ SOLN
1.0000 mg | Freq: Once | INTRAMUSCULAR | Status: AC
  Administered 2021-10-26: 14:00:00 1 mg via INTRAVENOUS

## 2021-10-26 MED ORDER — IOHEXOL 350 MG/ML SOLN
100.0000 mL | Freq: Once | INTRAVENOUS | Status: AC | PRN
  Administered 2021-10-26: 15:00:00 100 mL via INTRAVENOUS

## 2021-10-26 MED ORDER — MORPHINE SULFATE (PF) 4 MG/ML IV SOLN
4.0000 mg | Freq: Once | INTRAVENOUS | Status: AC
  Administered 2021-10-26: 13:00:00 4 mg via INTRAVENOUS
  Filled 2021-10-26: qty 1

## 2021-10-26 MED ORDER — LORAZEPAM 1 MG PO TABS: 1.0000 mg | ORAL_TABLET | Freq: Four times a day (QID) | ORAL | Status: DC | PRN

## 2021-10-26 MED ORDER — HYDROXYZINE HCL 25 MG PO TABS
50.0000 mg | ORAL_TABLET | Freq: Two times a day (BID) | ORAL | Status: DC
  Administered 2021-10-26 – 2021-10-27 (×2): 50 mg via ORAL
  Filled 2021-10-26 (×2): qty 2

## 2021-10-26 MED ORDER — CHLORDIAZEPOXIDE HCL 25 MG PO CAPS
50.0000 mg | ORAL_CAPSULE | Freq: Once | ORAL | Status: AC
  Administered 2021-10-26: 14:00:00 50 mg via ORAL

## 2021-10-26 MED ORDER — OXYBUTYNIN CHLORIDE 5 MG PO TABS
5.0000 mg | ORAL_TABLET | Freq: Every day | ORAL | Status: DC
  Administered 2021-10-27: 11:00:00 5 mg via ORAL
  Filled 2021-10-26: qty 1

## 2021-10-26 MED ORDER — TRAZODONE HCL 100 MG PO TABS
100.0000 mg | ORAL_TABLET | Freq: Every day | ORAL | Status: DC
  Administered 2021-10-26: 23:00:00 100 mg via ORAL
  Filled 2021-10-26: qty 1

## 2021-10-26 MED ORDER — MAGNESIUM SULFATE 2 GM/50ML IV SOLN
2.0000 g | Freq: Once | INTRAVENOUS | Status: AC
  Administered 2021-10-26: 23:00:00 2 g via INTRAVENOUS
  Filled 2021-10-26: qty 50

## 2021-10-26 MED ORDER — ACETAMINOPHEN 325 MG PO TABS
650.0000 mg | ORAL_TABLET | Freq: Four times a day (QID) | ORAL | Status: DC | PRN
  Administered 2021-10-26: 23:00:00 650 mg via ORAL
  Filled 2021-10-26: qty 2

## 2021-10-26 MED ORDER — PANTOPRAZOLE SODIUM 40 MG PO TBEC
40.0000 mg | DELAYED_RELEASE_TABLET | Freq: Every day | ORAL | Status: DC
  Administered 2021-10-27: 11:00:00 40 mg via ORAL
  Filled 2021-10-26: qty 1

## 2021-10-26 MED ORDER — DORZOLAMIDE HCL-TIMOLOL MAL 2-0.5 % OP SOLN
1.0000 [drp] | Freq: Two times a day (BID) | OPHTHALMIC | Status: DC
  Administered 2021-10-26 – 2021-10-27 (×2): 1 [drp] via OPHTHALMIC
  Filled 2021-10-26: qty 10

## 2021-10-26 MED ORDER — ENOXAPARIN SODIUM 40 MG/0.4ML IJ SOSY: 40.0000 mg | PREFILLED_SYRINGE | INTRAMUSCULAR | Status: DC

## 2021-10-26 MED ORDER — SODIUM CHLORIDE 0.9 % IV BOLUS
1000.0000 mL | Freq: Once | INTRAVENOUS | Status: AC
  Administered 2021-10-26: 13:00:00 1000 mL via INTRAVENOUS

## 2021-10-26 MED ORDER — ONDANSETRON HCL 4 MG/2ML IJ SOLN: 4.0000 mg | Freq: Four times a day (QID) | INTRAMUSCULAR | Status: DC | PRN

## 2021-10-26 MED ORDER — METOCLOPRAMIDE HCL 5 MG/ML IJ SOLN
10.0000 mg | Freq: Once | INTRAMUSCULAR | Status: AC
  Administered 2021-10-26: 13:00:00 10 mg via INTRAVENOUS
  Filled 2021-10-26: qty 2

## 2021-10-26 NOTE — ED Notes (Addendum)
Pulse ox drop to 89 while ambulating to bathroom from room.

## 2021-10-26 NOTE — ED Provider Notes (Signed)
[facility] DEPT Provider Note   CSN: [id number] Arrival date & time: 10/26/21  1215     History Chief Complaint  Patient presents with   Tremors   Abdominal Pain    Kenneth Roberts is a 80 y.o. male.  HPI  Patient with history of alcohol abuse, GERD, hypertension, previous PE not currently on anticoagulation presents due to abdominal pain.  The abdominal pain started acutely this morning, its been constant.  Is periumbilical, sometimes it radiates to the back.  There is nausea and multiple episodes of emesis this morning.  Denies any diarrhea, dysuria, hematuria, dark and tarry stool.  Patient's last alcoholic beverage was yesterday.  Denies any previous abdominal surgeries.  Was unable to take any of his home meds today secondary to emesis.  Past Medical History:  Diagnosis Date   Adenomatous colon polyp 01/2009   Anemia    BPH (benign prostatic hypertrophy)    Cataract    Chronic headaches    Colon polyps    Fatty liver    GERD (gastroesophageal reflux disease)    Heartburn    Hepatic hemangioma    Hiatal hernia    Hypertension    Internal hemorrhoids    Leukopenia    Pulmonary embolism [facility])     Patient Active Problem List   Diagnosis Date Noted   COVID-19 virus infection 08/31/2021   Normocytic anemia 58/52/7782   Alcoholic ketoacidosis 42/35/3614   Alcohol withdrawal ([place]) 01/15/2021   Prolonged QT interval 01/15/2021   Acute pulmonary embolism ([place]) 11/07/2020   Pleuritic chest pain 11/06/2020   Pulmonary embolism on right ([place]) 11/06/2020   Community acquired pneumonia 11/06/2020   Hiatal hernia 12/25/2016   Routine general medical examination at a health care facility 12/22/2015   Precordial chest pain 12/20/2015   Dyshidrotic hand dermatitis 04/13/2014   Lumbago 01/30/2014   HLD (hyperlipidemia) 07/21/2013   Hemorrhoids 04/14/2012   BPH (benign prostatic hyperplasia) 04/14/2012   PERSONAL HX COLONIC POLYPS 01/20/2011    Essential hypertension 04/27/2007   GERD 04/27/2007    Past Surgical History:  Procedure Laterality Date   CATARACT EXTRACTION     Left eye   COLONOSCOPY         Family History  Problem Relation Age of Onset   Stomach cancer Mother    Diabetes Father    Diabetes Brother    Diabetes Sister    Colon cancer Neg Hx    Colon polyps Neg Hx    Esophageal cancer Neg Hx     Social History   Tobacco Use   Smoking status: Former    Packs/day: 0.25    Years: 35.00    Pack years: 8.75    Types: Cigarettes    Quit date: 02/16/1981    Years since quitting: 40.7   Smokeless tobacco: Never  Vaping Use   Vaping Use: Never used  Substance Use Topics   Alcohol use: Yes    Comment: "been drinking a lot"   Drug use: No    Home Medications Prior to Admission medications   Medication Sig Start Date End Date Taking? Authorizing Provider  dorzolamide-timolol (COSOPT) 22.3-6.8 MG/ML ophthalmic solution Place 1 drop into both eyes 2 (two) times daily. 11/28/19   [provider]  losartan (COZAAR) 50 MG tablet TAKE 1 TABLET (50 MG TOTAL) BY MOUTH DAILY. Patient taking differently: Take 50 mg by mouth daily. 11/08/20 11/08/21  Kayleen Memos, DO  pantoprazole (PROTONIX) 40 MG tablet Take 40 mg  by mouth daily. 03/09/20   [provider]  tamsulosin (FLOMAX) 0.4 MG CAPS capsule Take 0.4 mg by mouth daily. 01/22/21   [provider]    Allergies    Gadolinium derivatives and Prilosec [omeprazole]  Review of Systems   Review of Systems  Constitutional:  Negative for fever.  HENT:  Negative for sore throat and trouble swallowing.   Respiratory:  Negative for shortness of breath.   Cardiovascular:  Negative for chest pain.  Gastrointestinal:  Positive for abdominal pain, nausea and vomiting. Negative for constipation and diarrhea.  Genitourinary:  Negative for dysuria and urgency.  Musculoskeletal:  Positive for back pain.  Neurological:  Negative for syncope.    Physical Exam Updated Vital Signs BP 136/89 (BP Location: Left Arm)    Pulse (!) 104    Temp 98.6 F (37 C) (Oral)    Resp (!) 22    SpO2 100%   Physical Exam Vitals and nursing note reviewed. Exam conducted with a chaperone present.  Constitutional:      Appearance: Normal appearance.  HENT:     Head: Normocephalic and atraumatic.  Eyes:     General: No scleral icterus.       Right eye: No discharge.        Left eye: No discharge.     Extraocular Movements: Extraocular movements intact.     Pupils: Pupils are equal, round, and reactive to light.  Cardiovascular:     Rate and Rhythm: Regular rhythm. Tachycardia present.     Pulses: Normal pulses.     Heart sounds: Normal heart sounds. No murmur heard.   No friction rub. No gallop.  Pulmonary:     Effort: Pulmonary effort is normal. No respiratory distress.     Breath sounds: Normal breath sounds.  Abdominal:     General: Abdomen is flat. Bowel sounds are normal. There is no distension.     Palpations: Abdomen is soft.     Tenderness: There is abdominal tenderness in the epigastric area and periumbilical area. There is guarding.  Skin:    General: Skin is warm and dry.     Coloration: Skin is not jaundiced.  Neurological:     Mental Status: He is alert. Mental status is at baseline.     Coordination: Coordination normal.   ED Results / Procedures / Treatments   Labs (all labs ordered are listed, but only abnormal results are displayed) Labs Reviewed  RESP PANEL BY RT-PCR (FLU A&B, COVID) ARPGX2  CBC WITH DIFFERENTIAL/PLATELET  COMPREHENSIVE METABOLIC PANEL  LIPASE, BLOOD  URINALYSIS, ROUTINE W REFLEX MICROSCOPIC    EKG None  Radiology No results found.  Procedures Procedures   Medications Ordered in ED Medications  metoCLOPramide (REGLAN) injection 10 mg (has no administration in time range)  sodium chloride 0.9 % bolus 1,000 mL (has no administration in time range)  morphine 4 MG/ML injection 4 mg (has  no administration in time range)    ED Course  I have reviewed the triage vital signs and the nursing notes.  Pertinent labs & imaging results that were available during my care of the patient were reviewed by me and considered in my medical decision making (see chart for details).  Clinical Course as of 10/26/21 1551  Sat Oct 26, 2021  1402 Patient desatted and was on 86% oxygen on room air.  Put on 2 L nasal cannula.  Improvement of saturation. [HS]    Clinical Course User Index [HS] Sherrill Raring, [place]  MDM Rules/Calculators/A&P                         Patient initially slightly tachycardic, he does have periumbilical abdominal pain which is not super focal.  There is voluntary guarding, no rigidity.  We will start with labs.  He does not appear to be in active withdrawal, he does have some type of tremor.  Reglan given given history of QT prolongation will avoid Zofran for the nausea.  Also given fluids.  Patient became acutely hypoxic after being given the Ativan and Librium and Reglan and morphine, its possible be sedated respiratory drive.  Put on 2 L out nasal cannula, oxygenation improved.  He is not tachypneic or tachycardic, will continue to monitor.  Given the sudden drop in hypoxia and his abdominal pain proceeded with CT chest and abdomen.  CTA chest abdomen negative for any PE or acute dissection.  There is some scarring to the lower lungs which appears chronic.  No new pneumonia, patient's symptoms have improved.  His nausea and vomiting is better.  He is no longer hypoxic  With ambulation patient became hypoxic at 88%.  Given the new hypoxia I think patient needs admission.  I will consult with hospitalist.     Final Clinical Impression(s) / ED Diagnoses Final diagnoses:  None    Rx / DC Orders ED Discharge Orders     None        Sherrill Raring, Hershal Coria 10/26/21 1705    Lacretia Leigh, MD 10/27/21 [phone]

## 2021-10-26 NOTE — H&P (Signed)
History and Physical    [name] SJG:[id number] DOB: 1941/07/19 DOA: 10/26/2021  PCP: Sonia Side., FNP   Patient coming from: Home   Chief Complaint:  abdominal pain.   HPI: Kenneth Roberts is a 80 y.o. male with medical history significant of BPH, GERD, HTN and history of pulmonary embolism, who presented with abdominal pain. Reports poor appetite for 24 hours, having his last meal about 48 hr ago. Today he had lower abdominal pain, burning in nature, worse with urination, associated with increase urinary frequency, and chills. Today her daughter found him ill looking appearing, had one episode of nausea and vomiting while having abdominal pain and she brought him to the hospital.   Patient lives by himself and drinks alcohol every days. He has history of tobacco abuse but quit many years ago.    ED Course: patient was noted having withdrawal symptoms, and was treated with lorazepam, chlordiazepoxide, metoclopramide and morphine. Patient became more sedated but was noted to be hypoxic. His oxymetry went down to 88% on room air on ambulation and was referred for admission and further evaluation.   Review of Systems:  1. General: No fevers, but positive chills, no weight gain or weight loss 2. ENT: No runny nose or sore throat, no hearing disturbances 3. Pulmonary: No dyspnea, cough, wheezing, or hemoptysis 4. Cardiovascular: No angina, claudication, lower extremity edema, pnd or orthopnea 5. Gastrointestinal: positive abdominal pain, nausea and vomiting, as mentioned in HPI, no diarrhea 6. Hematology: No easy bruisability or frequent infections 7. Urology: positive dysuria and  increased urinary frequency 8. Dermatology: No rashes. 9. Neurology: No seizures or paresthesias 10. Musculoskeletal: No joint pain or deformities  Past Medical History:  Diagnosis Date   Adenomatous colon polyp 01/2009   Anemia    BPH (benign prostatic hypertrophy)    Cataract    Chronic  headaches    Colon polyps    Fatty liver    GERD (gastroesophageal reflux disease)    Heartburn    Hepatic hemangioma    Hiatal hernia    Hypertension    Internal hemorrhoids    Leukopenia    Pulmonary embolism ([place])     Past Surgical History:  Procedure Laterality Date   CATARACT EXTRACTION     Left eye   COLONOSCOPY       reports that he quit smoking about 40 years ago. His smoking use included cigarettes. He has a 8.75 pack-year smoking history. He has never used smokeless tobacco. He reports current alcohol use. He reports that he does not use drugs.  Allergies  Allergen Reactions   Gadolinium Derivatives Other (See Comments)    unknown   Prilosec [Omeprazole] Itching    Family History  Problem Relation Age of Onset   Stomach cancer Mother    Diabetes Father    Diabetes Brother    Diabetes Sister    Colon cancer Neg Hx    Colon polyps Neg Hx    Esophageal cancer Neg Hx      Prior to Admission medications   Medication Sig Start Date End Date Taking? Authorizing Provider  dorzolamide-timolol (COSOPT) 22.3-6.8 MG/ML ophthalmic solution Place 1 drop into both eyes 2 (two) times daily. 11/28/19  Yes [provider]  hydrOXYzine (ATARAX) 50 MG tablet Take 50 mg by mouth 2 (two) times daily. 09/10/21  Yes [provider]  losartan (COZAAR) 50 MG tablet TAKE 1 TABLET (50 MG TOTAL) BY MOUTH DAILY. Patient taking differently: Take 50 mg by  mouth daily. 11/08/20 11/08/21 Yes Hall, Carole N, DO  oxybutynin (DITROPAN) 5 MG tablet Take 5 mg by mouth daily. 09/10/21  Yes [provider]  pantoprazole (PROTONIX) 40 MG tablet Take 40 mg by mouth daily. 03/09/20  Yes [provider]  traZODone (DESYREL) 100 MG tablet Take 100 mg by mouth at bedtime.   Yes [provider]    Physical Exam: Vitals:   10/26/21 1517 10/26/21 1530 10/26/21 1545 10/26/21 1600  BP: 134/86 (!) 145/88 (!) 146/85 (!) 156/88  Pulse: 86 87 82 96  Resp: 18 17 15 17    Temp:      TempSrc:      SpO2: 99% 100% 100% 99%    Vitals:   10/26/21 1517 10/26/21 1530 10/26/21 1545 10/26/21 1600  BP: 134/86 (!) 145/88 (!) 146/85 (!) 156/88  Pulse: 86 87 82 96  Resp: 18 17 15 17   Temp:      TempSrc:      SpO2: 99% 100% 100% 99%   General: deconditioned and ill looking appearing, sedated  Neurology: somnolent but easy to arouse, follows commands and responds to questions, no tremors.  Head and Neck. Head normocephalic. Neck supple with no adenopathy or thyromegaly.   E ENT: mild pallor, no icterus, oral mucosa moist Cardiovascular: No JVD. S1-S2 present, rhythmic, no gallops, rubs, or murmurs. No lower extremity edema. Pulmonary: positive breath sounds bilaterally, with no wheezing, rhonchi or rales. Gastrointestinal. Abdomen not distended, tender to palpation at the pelvic region, no rebound or guarding, no masses Skin. No rashes Musculoskeletal: no joint deformities    Labs on Admission: I have personally reviewed following labs and imaging studies  CBC: Recent Labs  Lab 10/26/21 1303  WBC 4.7  NEUTROABS 3.3  HGB 12.9*  HCT 36.2*  MCV 85.8  PLT 761   Basic Metabolic Panel: Recent Labs  Lab 10/26/21 1303 10/26/21 1310  NA 135  --   K 3.9  --   CL 105  --   CO2 15*  --   GLUCOSE 93  --   BUN 12  --   CREATININE 0.99  --   CALCIUM 9.4  --   MG  --  1.7   GFR: CrCl cannot be calculated (Unknown ideal weight.). Liver Function Tests: Recent Labs  Lab 10/26/21 1303  AST 40  ALT 17  ALKPHOS 41  BILITOT 1.4*  PROT 8.1  ALBUMIN 4.3   Recent Labs  Lab 10/26/21 1303  LIPASE 51   No results for input(s): AMMONIA in the last 168 hours. Coagulation Profile: No results for input(s): INR, PROTIME in the last 168 hours. Cardiac Enzymes: No results for input(s): CKTOTAL, CKMB, CKMBINDEX, TROPONINI in the last 168 hours. BNP (last 3 results) No results for input(s): PROBNP in the last 8760 hours. HbA1C: No results for input(s):  HGBA1C in the last 72 hours. CBG: No results for input(s): GLUCAP in the last 168 hours. Lipid Profile: No results for input(s): CHOL, HDL, LDLCALC, TRIG, CHOLHDL, LDLDIRECT in the last 72 hours. Thyroid Function Tests: No results for input(s): TSH, T4TOTAL, FREET4, T3FREE, THYROIDAB in the last 72 hours. Anemia Panel: No results for input(s): VITAMINB12, FOLATE, FERRITIN, TIBC, IRON, RETICCTPCT in the last 72 hours. Urine analysis:    Component Value Date/Time   COLORURINE YELLOW 08/30/2021 [place] 08/30/2021 1343   LABSPEC 1.014 08/30/2021 1343   PHURINE 7.0 08/30/2021 1343   GLUCOSEU NEGATIVE 08/30/2021 1343   GLUCOSEU NEGATIVE 12/20/2015 1047  HGBUR NEGATIVE 08/30/2021 [place] 08/30/2021 1343   BILIRUBINUR NEG 03/25/2013 1442   KETONESUR 20 (A) 08/30/2021 1343   PROTEINUR NEGATIVE 08/30/2021 1343   UROBILINOGEN 0.2 12/20/2015 1047   NITRITE NEGATIVE 08/30/2021 1343   [place] 08/30/2021 1343    Radiological Exams on Admission: CT Head Wo Contrast  Result Date: 10/26/2021 CLINICAL DATA:  Mental status changes. EXAM: CT HEAD WITHOUT CONTRAST TECHNIQUE: Contiguous axial images were obtained from the base of the skull through the vertex without intravenous contrast. COMPARISON:  01/14/2021 FINDINGS: Brain: There is no evidence for acute hemorrhage, hydrocephalus, mass lesion, or abnormal extra-axial fluid collection. No definite CT evidence for acute infarction. Diffuse loss of parenchymal volume is consistent with atrophy. Patchy low attenuation in the deep hemispheric and periventricular white matter is nonspecific, but likely reflects chronic microvascular ischemic demyelination. Vascular: No hyperdense vessel or unexpected calcification. Skull: No evidence for fracture. No worrisome lytic or sclerotic lesion. Sinuses/Orbits: The visualized paranasal sinuses and mastoid air cells are clear. Visualized portions of the globes and  intraorbital fat are unremarkable. Other: None. IMPRESSION: 1. No acute intracranial abnormality. 2. Atrophy with chronic small vessel white matter ischemic disease. Electronically Signed   By: Misty Stanley M.D.   On: 10/26/2021 14:53   CT Angio Chest/Abd/Pel for Dissection W and/or W/WO  Result Date: 10/26/2021 CLINICAL DATA:  Abdominal pain, vomiting, lethargy, shortness of breath, evaluate for acute aortic syndrome. EXAM: CT ANGIOGRAPHY CHEST, ABDOMEN AND PELVIS TECHNIQUE: Non-contrast CT of the chest was initially obtained. Multidetector CT imaging through the chest, abdomen and pelvis was performed using the standard protocol during bolus administration of intravenous contrast. Multiplanar reconstructed images and MIPs were obtained and reviewed to evaluate the vascular anatomy. CONTRAST:  142mL OMNIPAQUE IOHEXOL 350 MG/ML SOLN COMPARISON:  CT abdomen/pelvis dated 08/30/2021. CTA chest dated 11/06/2020. FINDINGS: CTA CHEST FINDINGS Cardiovascular: On unenhanced CT, there is no evidence of intramural hematoma. Following contrast administration, there is preferential opacification of the thoracic aorta. No evidence of thoracic aortic aneurysm or dissection. Mild atherosclerotic calcifications of the aortic arch. Although not tailored for evaluation of the pulmonary arteries, there is no evidence of pulmonary embolism to the lobar level. The heart is normal in size.  No pericardial effusion. Mediastinum/Nodes: No suspicious mediastinal lymphadenopathy. Visualized thyroid is unremarkable. Lungs/Pleura: Mild biapical pleural-parenchymal scarring. Mild centrilobular and paraseptal emphysematous changes, upper lung predominant. Mild dependent patchy opacities in the bilateral lower lobes, favoring atelectasis. Additional mild scarring/atelectasis in the anterior left upper lobe, unchanged. No suspicious pulmonary nodules. No pleural effusion or pneumothorax. Musculoskeletal: No focal osseous lesions. Review of  the MIP images confirms the above findings. CTA ABDOMEN AND PELVIS FINDINGS VASCULAR Aorta: No evidence abdominal aortic aneurysm or dissection.  Patent. Celiac: Patent. SMA: Patent. Renals: Patent bilaterally. IMA: Patent. Inflow: Patent bilaterally.  Atherosclerotic calcifications. Veins: Grossly unremarkable. Review of the MIP images confirms the above findings. NON-VASCULAR Hepatobiliary: Liver is within normal limits. Gallbladder is notable for layering sludge versus noncalcified gallstones (series 6/image 115). No associated inflammatory changes. No intrahepatic or extrahepatic ductal dilatation. Pancreas: Within normal limits. Spleen: Within normal limits. Adrenals/Urinary Tract: Adrenal glands are within normal limits. Kidneys are within normal limits.  No hydronephrosis. Bladder is within normal limits. Stomach/Bowel: Stomach is within normal limits. No evidence of bowel obstruction. Normal appendix (series 6/image 136). Left colon is decompressed. Lymphatic: No suspicious abdominopelvic lymphadenopathy. Reproductive: Prostate is unremarkable. Other: No abdominopelvic ascites. Musculoskeletal: Moderate degenerative changes of the lumbar spine. Review of  the MIP images confirms the above findings. IMPRESSION: No evidence of thoracoabdominal aortic aneurysm or dissection. No evidence of bowel obstruction.  Normal appendix. Layering gallbladder sludge versus noncalcified gallstones, without associated inflammatory changes. Electronically Signed   By: Julian Hy M.D.   On: 10/26/2021 14:57    EKG: Independently reviewed. NA   CT chest with no pulmonary embolism, positive mild emphysema and scarring at the bases bilaterally.   Assessment/Plan Principal Problem:   Hypoxemia Active Problems:   Essential hypertension   GERD   BPH (benign prostatic hyperplasia)   Alcohol withdrawal (HCC)   Acute lower UTI  80 year old male with a past medical history for BPH, alcohol abuse and remote history  of smoking who presented with lower abdominal pain, associated with dysuria, increased urinary frequency, nausea, vomiting and chills.  In the emergency department he was noted to have alcohol withdrawal symptoms consistent with tremors.  He received medical therapy with benzodiazepines, opiates and antiemetics.  He developed acute hypoxic respiratory failure. On his initial physical examination blood pressure 145/88, heart rate 87, respiratory rate 17, oxygen saturation 99% on supplemental oxygen 2 L per nasal cannula. His lungs had no wheezing or rhonchi, heart S1-S2, present, rhythmic, abdomen tender to palpation in the pelvic area, no lower extremity edema.  Sodium 135, potassium 3.9, chloride 105, bicarb 15, glucose 93, BUN 12, creatinine 0.99, anion gap 15, magnesium 1.7, AST 40, ALT 17. White count 4.7, hemoglobin 0.9, hematocrit 36.2, platelets 225. SARS COVID-19 negative.  Urinalysis pending.  Mr. Chagnon will be admitted to the hospital working diagnosis of cystitis, urinary tract infection, complicated by alcohol withdrawal syndrome and acute hypoxic respiratory failure.  Acute urinary tract infection, cystitis.  BPH.  Patient has history of BPH with frank urinary symptoms, his urinalysis is pending.  Plan to start empiric antibiotic therapy with intravenous ceftriaxone, follow-up on cultures, cell count and temperature curve. Continue oxybutynin.  2.  Acute alcohol withdrawal syndrome/anion gap metabolic acidosis, hypomagnesemia..  Patient had mild withdrawal symptoms, he does have a anion gap metabolic acidosis. Possibly developing alcoholic ketosis.  Continue neurochecks per unit protocol, add lorazepam as needed, IV fluids with D5 normal saline at 75 ml per hour.  At 2 g magnesium sulfate intravenously, Follow-up renal function, and electrolytes in AM.  3.  Hypertension.  Continue blood pressure control with losartan.  4.  GERD.  Continue pantoprazole.  5.  Acute hypoxemic  respiratory failure.  Patient does have emphysema per CT chest.  Likely poor pulmonary reserve that now has been compromised by sedatives. No signs of pneumonia. Continue oximetry monitoring.   Status is: Observation  The patient remains OBS appropriate and will d/c before 2 midnights.   DVT prophylaxis: Enoxaparin   Code Status:    full  Family Communication:   I spoke with patient's daughter at the bedside, we talked in detail about patient's condition, plan of care and prognosis and all questions were addressed.     Consults called:  None   Admission status:  Observation    Monique Gift Gerome Apley MD [facility]   10/26/2021, 5:00 PM

## 2021-10-26 NOTE — ED Triage Notes (Addendum)
Pt arrived via EMS, from home, c/o generalized abd pain, vomiting, and tremulous. Started 10am today.    150/80 BP 78 HR 99% CBG 117

## 2021-10-27 ENCOUNTER — Other Ambulatory Visit: Payer: Self-pay

## 2021-10-27 DIAGNOSIS — I10 Essential (primary) hypertension: Secondary | ICD-10-CM | POA: Diagnosis not present

## 2021-10-27 DIAGNOSIS — N401 Enlarged prostate with lower urinary tract symptoms: Secondary | ICD-10-CM

## 2021-10-27 DIAGNOSIS — J9601 Acute respiratory failure with hypoxia: Secondary | ICD-10-CM | POA: Diagnosis not present

## 2021-10-27 DIAGNOSIS — R0902 Hypoxemia: Secondary | ICD-10-CM | POA: Diagnosis not present

## 2021-10-27 DIAGNOSIS — R3916 Straining to void: Secondary | ICD-10-CM

## 2021-10-27 DIAGNOSIS — F10930 Alcohol use, unspecified with withdrawal, uncomplicated: Secondary | ICD-10-CM | POA: Diagnosis not present

## 2021-10-27 LAB — CBC
HCT: 31.7 % — ABNORMAL LOW (ref 39.0–52.0)
Hemoglobin: 11.3 g/dL — ABNORMAL LOW (ref 13.0–17.0)
MCH: 31 pg (ref 26.0–34.0)
MCHC: 35.6 g/dL (ref 30.0–36.0)
MCV: 87.1 fL (ref 80.0–100.0)
Platelets: 170 10*3/uL (ref 150–400)
RBC: 3.64 MIL/uL — ABNORMAL LOW (ref 4.22–5.81)
RDW: 16.4 % — ABNORMAL HIGH (ref 11.5–15.5)
WBC: 3.7 10*3/uL — ABNORMAL LOW (ref 4.0–10.5)
nRBC: 0 % (ref 0.0–0.2)

## 2021-10-27 LAB — BASIC METABOLIC PANEL
Anion gap: 7 (ref 5–15)
BUN: 12 mg/dL (ref 8–23)
CO2: 23 mmol/L (ref 22–32)
Calcium: 8.5 mg/dL — ABNORMAL LOW (ref 8.9–10.3)
Chloride: 106 mmol/L (ref 98–111)
Creatinine, Ser: 0.91 mg/dL (ref 0.61–1.24)
GFR, Estimated: >60 mL/min (ref >60)
Glucose, Bld: 114 mg/dL — ABNORMAL HIGH (ref 70–99)
Potassium: 3.3 mmol/L — ABNORMAL LOW (ref 3.5–5.1)
Sodium: 136 mmol/L (ref 135–145)

## 2021-10-27 MED ORDER — CEPHALEXIN 500 MG PO CAPS: 500.0000 mg | ORAL_CAPSULE | Freq: Two times a day (BID) | ORAL | 0 refills | Status: AC

## 2021-10-27 MED ORDER — POTASSIUM CHLORIDE CRYS ER 20 MEQ PO TBCR
40.0000 meq | EXTENDED_RELEASE_TABLET | Freq: Once | ORAL | Status: AC
  Administered 2021-10-27: 11:00:00 40 meq via ORAL
  Filled 2021-10-27: qty 2

## 2021-10-27 MED ORDER — SODIUM CHLORIDE 0.9 % IV SOLN
1.0000 g | Freq: Once | INTRAVENOUS | Status: AC
  Administered 2021-10-27: 11:00:00 1 g via INTRAVENOUS
  Filled 2021-10-27: qty 10

## 2021-10-27 MED ORDER — CEPHALEXIN 500 MG PO CAPS: 500.0000 mg | ORAL_CAPSULE | Freq: Two times a day (BID) | ORAL | Status: DC

## 2021-10-27 NOTE — Plan of Care (Signed)
Patient care plan adequate for DC

## 2021-10-27 NOTE — Discharge Summary (Signed)
Physician Discharge Summary  [name] WLS:[id number] DOB: Jan 05, 1941 DOA: 10/26/2021  PCP: Sonia Side., FNP  Admit date: 10/26/2021 Discharge date: 10/27/2021  Admitted From: Home  Disposition:  Home   Recommendations for Outpatient Follow-up and new medication changes:  Follow up with Kenneth Folks FNP in 7 to 10 days  Continue antibiotic therapy with cephalexin for 2 more days   Home Health: no   Equipment/Devices: no    Discharge Condition: stable  CODE STATUS: full  Diet recommendation:  heart healthy   Brief/Interim Summary: [name] was admitted to the hospital with the working diagnosis of cystitis, urinary tract infection, complicated by alcohol withdrawal syndrome and acute hypoxic respiratory failure.  80 year old male with a past medical history for BPH, alcohol abuse and remote history of smoking who presented with lower abdominal pain, associated with dysuria, increased urinary frequency, nausea, vomiting and chills.  In the emergency department he was noted to have alcohol withdrawal symptoms consistent with tremors.  He received medical therapy with benzodiazepines, opiates and antiemetics.  He developed acute hypoxic respiratory failure. On his initial physical examination blood pressure 145/88, heart rate 87, respiratory rate 17, oxygen saturation 99% on supplemental oxygen 2 L per nasal cannula. His lungs had no wheezing or rhonchi, heart S1-S2, present, rhythmic, abdomen tender to palpation in the pelvic area, no lower extremity edema.   Sodium 135, potassium 3.9, chloride 105, bicarb 15, glucose 93, BUN 12, creatinine 0.99, anion gap 15, magnesium 1.7, AST 40, ALT 17. White count 4.7, hemoglobin 0.9, hematocrit 36.2, platelets 225. SARS COVID-19 negative.   Urine analysis pending.   Patient was started on empiric antibiotic therapy with significant improvement of his symptoms.   1. Acute urinary tract infection, cystitis. BPH. Patient was placed on  IV ceftriaxone with significant improvement of his symptoms. No further abdominal pain, nausea or vomiting Follow up wbc 3.7  Plan to continue antibiotic therapy for 2 more days and follow up with primary care as outpatient.  Continue with oxybutynin.   2. Acute alcohol withdrawal syndrome with positive anion gap metabolic acidosis. Hypomagnesemia. Hypokalemia Patient received IV fluids with isotonic saline with dextrose with good toleration, his follow up renal function showed serum cr of 0.91, K is 3,3 and serum bicarbonate at 23.  He will receive 40 meq Kcl before discharge and plan to follow up as outpatient.   3. HTN. Continue blood pressure control with losartan.   4. GERD continue with proton pump inhibitors.   5.  Acute hypoxemic respiratory failure.  Patient does have emphysema per CT chest.  Likely poor pulmonary reserve that now has been compromised by sedatives. No signs of pneumonia. At his discharge  his oxymetry is 100% on room air and he has no dyspnea.   Discharge Diagnoses:  Principal Problem:   Hypoxemia Active Problems:   Essential hypertension   GERD   BPH (benign prostatic hyperplasia)   Alcohol withdrawal (HCC)   Acute lower UTI    Discharge Instructions   Allergies as of 10/27/2021       Reactions   Gadolinium Derivatives Other (See Comments)   unknown   Prilosec [omeprazole] Itching        Medication List     TAKE these medications    cephALEXin 500 MG capsule Commonly known as: KEFLEX Take 1 capsule (500 mg total) by mouth every 12 (twelve) hours for 2 days. Start taking on: October 28, 2021   dorzolamide-timolol 22.3-6.8 MG/ML ophthalmic solution Commonly known as:  COSOPT Place 1 drop into both eyes 2 (two) times daily.   hydrOXYzine 50 MG tablet Commonly known as: ATARAX Take 50 mg by mouth 2 (two) times daily.   losartan 50 MG tablet Commonly known as: COZAAR TAKE 1 TABLET (50 MG TOTAL) BY MOUTH DAILY. What changed: how  much to take   oxybutynin 5 MG tablet Commonly known as: DITROPAN Take 5 mg by mouth daily.   pantoprazole 40 MG tablet Commonly known as: PROTONIX Take 40 mg by mouth daily.   traZODone 100 MG tablet Commonly known as: DESYREL Take 100 mg by mouth at bedtime.        Allergies  Allergen Reactions   Gadolinium Derivatives Other (See Comments)    unknown   Prilosec [Omeprazole] Itching      Procedures/Studies: CT Head Wo Contrast  Result Date: 10/26/2021 CLINICAL DATA:  Mental status changes. EXAM: CT HEAD WITHOUT CONTRAST TECHNIQUE: Contiguous axial images were obtained from the base of the skull through the vertex without intravenous contrast. COMPARISON:  01/14/2021 FINDINGS: Brain: There is no evidence for acute hemorrhage, hydrocephalus, mass lesion, or abnormal extra-axial fluid collection. No definite CT evidence for acute infarction. Diffuse loss of parenchymal volume is consistent with atrophy. Patchy low attenuation in the deep hemispheric and periventricular white matter is nonspecific, but likely reflects chronic microvascular ischemic demyelination. Vascular: No hyperdense vessel or unexpected calcification. Skull: No evidence for fracture. No worrisome lytic or sclerotic lesion. Sinuses/Orbits: The visualized paranasal sinuses and mastoid air cells are clear. Visualized portions of the globes and intraorbital fat are unremarkable. Other: None. IMPRESSION: 1. No acute intracranial abnormality. 2. Atrophy with chronic small vessel white matter ischemic disease. Electronically Signed   By: Misty Stanley M.D.   On: 10/26/2021 14:53   CT Angio Chest/Abd/Pel for Dissection W and/or W/WO  Result Date: 10/26/2021 CLINICAL DATA:  Abdominal pain, vomiting, lethargy, shortness of breath, evaluate for acute aortic syndrome. EXAM: CT ANGIOGRAPHY CHEST, ABDOMEN AND PELVIS TECHNIQUE: Non-contrast CT of the chest was initially obtained. Multidetector CT imaging through the chest,  abdomen and pelvis was performed using the standard protocol during bolus administration of intravenous contrast. Multiplanar reconstructed images and MIPs were obtained and reviewed to evaluate the vascular anatomy. CONTRAST:  19mL OMNIPAQUE IOHEXOL 350 MG/ML SOLN COMPARISON:  CT abdomen/pelvis dated 08/30/2021. CTA chest dated 11/06/2020. FINDINGS: CTA CHEST FINDINGS Cardiovascular: On unenhanced CT, there is no evidence of intramural hematoma. Following contrast administration, there is preferential opacification of the thoracic aorta. No evidence of thoracic aortic aneurysm or dissection. Mild atherosclerotic calcifications of the aortic arch. Although not tailored for evaluation of the pulmonary arteries, there is no evidence of pulmonary embolism to the lobar level. The heart is normal in size.  No pericardial effusion. Mediastinum/Nodes: No suspicious mediastinal lymphadenopathy. Visualized thyroid is unremarkable. Lungs/Pleura: Mild biapical pleural-parenchymal scarring. Mild centrilobular and paraseptal emphysematous changes, upper lung predominant. Mild dependent patchy opacities in the bilateral lower lobes, favoring atelectasis. Additional mild scarring/atelectasis in the anterior left upper lobe, unchanged. No suspicious pulmonary nodules. No pleural effusion or pneumothorax. Musculoskeletal: No focal osseous lesions. Review of the MIP images confirms the above findings. CTA ABDOMEN AND PELVIS FINDINGS VASCULAR Aorta: No evidence abdominal aortic aneurysm or dissection.  Patent. Celiac: Patent. SMA: Patent. Renals: Patent bilaterally. IMA: Patent. Inflow: Patent bilaterally.  Atherosclerotic calcifications. Veins: Grossly unremarkable. Review of the MIP images confirms the above findings. NON-VASCULAR Hepatobiliary: Liver is within normal limits. Gallbladder is notable for layering sludge versus noncalcified gallstones (series 6/image  115). No associated inflammatory changes. No intrahepatic or  extrahepatic ductal dilatation. Pancreas: Within normal limits. Spleen: Within normal limits. Adrenals/Urinary Tract: Adrenal glands are within normal limits. Kidneys are within normal limits.  No hydronephrosis. Bladder is within normal limits. Stomach/Bowel: Stomach is within normal limits. No evidence of bowel obstruction. Normal appendix (series 6/image 136). Left colon is decompressed. Lymphatic: No suspicious abdominopelvic lymphadenopathy. Reproductive: Prostate is unremarkable. Other: No abdominopelvic ascites. Musculoskeletal: Moderate degenerative changes of the lumbar spine. Review of the MIP images confirms the above findings. IMPRESSION: No evidence of thoracoabdominal aortic aneurysm or dissection. No evidence of bowel obstruction.  Normal appendix. Layering gallbladder sludge versus noncalcified gallstones, without associated inflammatory changes. Electronically Signed   By: Julian Hy M.D.   On: 10/26/2021 14:57       Subjective: Patient is feeling better, no nausea or vomiting, no further abdominal pain, his oxygenation has been 100% on room air.   Discharge Exam: Vitals:   10/27/21 0201 10/27/21 0528  BP: 119/66 125/68  Pulse: 90 75  Resp: 18 18  Temp: 98.7 F (37.1 C) 98.7 F (37.1 C)  SpO2: 98% 100%   Vitals:   10/26/21 2118 10/27/21 0201 10/27/21 0230 10/27/21 0528  BP: 138/80 119/66  125/68  Pulse: 85 90  75  Resp: 18 18  18   Temp: 98.5 F (36.9 C) 98.7 F (37.1 C)  98.7 F (37.1 C)  TempSrc: Oral Oral  Oral  SpO2: 100% 98%  100%  Weight:   89 kg   Height:   6' (1.829 m)     General: Not in pain or dyspnea  Neurology: Awake and alert, non focal  E ENT: no pallor, no icterus, oral mucosa moist Cardiovascular: No JVD. S1-S2 present, rhythmic, no gallops, rubs, or murmurs. No lower extremity edema. Pulmonary: positive breath sounds bilaterally, adequate air movement, no wheezing, rhonchi or rales. Gastrointestinal. Abdomen soft and non tender Skin. No  rashes Musculoskeletal: no joint deformities   The results of significant diagnostics from this hospitalization (including imaging, microbiology, ancillary and laboratory) are listed below for reference.     Microbiology: Recent Results (from the past 240 hour(s))  Resp Panel by RT-PCR (Flu A&B, Covid) Nasopharyngeal Swab     Status: None   Collection Time: 10/26/21 12:32 PM   Specimen: Nasopharyngeal Swab; Nasopharyngeal(NP) swabs in vial transport medium  Result Value Ref Range Status   SARS Coronavirus 2 by RT PCR NEGATIVE NEGATIVE Final    Comment: (NOTE) SARS-CoV-2 target nucleic acids are NOT DETECTED.  The SARS-CoV-2 RNA is generally detectable in upper respiratory specimens during the acute phase of infection. The lowest concentration of SARS-CoV-2 viral copies this assay can detect is 138 copies/mL. A negative result does not preclude SARS-Cov-2 infection and should not be used as the sole basis for treatment or other patient management decisions. A negative result may occur with  improper specimen collection/handling, submission of specimen other than nasopharyngeal swab, presence of viral mutation(s) within the areas targeted by this assay, and inadequate number of viral copies(<138 copies/mL). A negative result must be combined with clinical observations, patient history, and epidemiological information. The expected result is Negative.  Fact Sheet for Patients:  EntrepreneurPulse.com.au  Fact Sheet for Healthcare Providers:  IncredibleEmployment.be  This test is no t yet approved or cleared by the [place] FDA and  has been authorized for detection and/or diagnosis of SARS-CoV-2 by FDA under an Emergency Use Authorization (EUA). This EUA will remain  in effect (meaning this  test can be used) for the duration of the COVID-19 declaration under Section 564(b)(1) of the Act, 21 U.S.C.section 360bbb-3(b)(1), unless the  authorization is terminated  or revoked sooner.       Influenza A by PCR NEGATIVE NEGATIVE Final   Influenza B by PCR NEGATIVE NEGATIVE Final    Comment: (NOTE) The Xpert Xpress SARS-CoV-2/FLU/RSV plus assay is intended as an aid in the diagnosis of influenza from Nasopharyngeal swab specimens and should not be used as a sole basis for treatment. Nasal washings and aspirates are unacceptable for Xpert Xpress SARS-CoV-2/FLU/RSV testing.  Fact Sheet for Patients: EntrepreneurPulse.com.au  Fact Sheet for Healthcare Providers: IncredibleEmployment.be  This test is not yet approved or cleared by the [place] FDA and has been authorized for detection and/or diagnosis of SARS-CoV-2 by FDA under an Emergency Use Authorization (EUA). This EUA will remain in effect (meaning this test can be used) for the duration of the COVID-19 declaration under Section 564(b)(1) of the Act, 21 U.S.C. section 360bbb-3(b)(1), unless the authorization is terminated or revoked.  Performed at [facility], [place] [address] [place],  [address]      Labs: BNP (last 3 results) No results for input(s): BNP in the last 8760 hours. Basic Metabolic Panel: Recent Labs  Lab 10/26/21 1303 10/26/21 1310 10/27/21 0717  NA 135  --  136  K 3.9  --  3.3*  CL 105  --  106  CO2 15*  --  23  GLUCOSE 93  --  114*  BUN 12  --  12  CREATININE 0.99  --  0.91  CALCIUM 9.4  --  8.5*  MG  --  1.7  --    Liver Function Tests: Recent Labs  Lab 10/26/21 1303  AST 40  ALT 17  ALKPHOS 41  BILITOT 1.4*  PROT 8.1  ALBUMIN 4.3   Recent Labs  Lab 10/26/21 1303  LIPASE 51   No results for input(s): AMMONIA in the last 168 hours. CBC: Recent Labs  Lab 10/26/21 1303 10/27/21 0717  WBC 4.7 3.7*  NEUTROABS 3.3  --   HGB 12.9* 11.3*  HCT 36.2* 31.7*  MCV 85.8 87.1  PLT 225 170   Cardiac Enzymes: No results for input(s): CKTOTAL, CKMB,  CKMBINDEX, TROPONINI in the last 168 hours. BNP: Invalid input(s): POCBNP CBG: No results for input(s): GLUCAP in the last 168 hours. D-Dimer No results for input(s): DDIMER in the last 72 hours. Hgb A1c No results for input(s): HGBA1C in the last 72 hours. Lipid Profile No results for input(s): CHOL, HDL, LDLCALC, TRIG, CHOLHDL, LDLDIRECT in the last 72 hours. Thyroid function studies No results for input(s): TSH, T4TOTAL, T3FREE, THYROIDAB in the last 72 hours.  Invalid input(s): FREET3 Anemia work up No results for input(s): VITAMINB12, FOLATE, FERRITIN, TIBC, IRON, RETICCTPCT in the last 72 hours. Urinalysis    Component Value Date/Time   COLORURINE YELLOW 08/30/2021 1343   APPEARANCEUR CLEAR 08/30/2021 1343   LABSPEC 1.014 08/30/2021 1343   PHURINE 7.0 08/30/2021 1343   GLUCOSEU NEGATIVE 08/30/2021 1343   GLUCOSEU NEGATIVE 12/20/2015 1047   HGBUR NEGATIVE 08/30/2021 1343   BILIRUBINUR NEGATIVE 08/30/2021 1343   BILIRUBINUR NEG 03/25/2013 1442   KETONESUR 20 (A) 08/30/2021 1343   PROTEINUR NEGATIVE 08/30/2021 1343   UROBILINOGEN 0.2 12/20/2015 1047   NITRITE NEGATIVE 08/30/2021 1343   LEUKOCYTESUR NEGATIVE 08/30/2021 1343   Sepsis Labs Invalid input(s): PROCALCITONIN,  WBC,  LACTICIDVEN Microbiology Recent Results (from the past 240  hour(s))  Resp Panel by RT-PCR (Flu A&B, Covid) Nasopharyngeal Swab     Status: None   Collection Time: 10/26/21 12:32 PM   Specimen: Nasopharyngeal Swab; Nasopharyngeal(NP) swabs in vial transport medium  Result Value Ref Range Status   SARS Coronavirus 2 by RT PCR NEGATIVE NEGATIVE Final    Comment: (NOTE) SARS-CoV-2 target nucleic acids are NOT DETECTED.  The SARS-CoV-2 RNA is generally detectable in upper respiratory specimens during the acute phase of infection. The lowest concentration of SARS-CoV-2 viral copies this assay can detect is 138 copies/mL. A negative result does not preclude SARS-Cov-2 infection and should not be  used as the sole basis for treatment or other patient management decisions. A negative result may occur with  improper specimen collection/handling, submission of specimen other than nasopharyngeal swab, presence of viral mutation(s) within the areas targeted by this assay, and inadequate number of viral copies(<138 copies/mL). A negative result must be combined with clinical observations, patient history, and epidemiological information. The expected result is Negative.  Fact Sheet for Patients:  EntrepreneurPulse.com.au  Fact Sheet for Healthcare Providers:  IncredibleEmployment.be  This test is no t yet approved or cleared by the [place] FDA and  has been authorized for detection and/or diagnosis of SARS-CoV-2 by FDA under an Emergency Use Authorization (EUA). This EUA will remain  in effect (meaning this test can be used) for the duration of the COVID-19 declaration under Section 564(b)(1) of the Act, 21 U.S.C.section 360bbb-3(b)(1), unless the authorization is terminated  or revoked sooner.       Influenza A by PCR NEGATIVE NEGATIVE Final   Influenza B by PCR NEGATIVE NEGATIVE Final    Comment: (NOTE) The Xpert Xpress SARS-CoV-2/FLU/RSV plus assay is intended as an aid in the diagnosis of influenza from Nasopharyngeal swab specimens and should not be used as a sole basis for treatment. Nasal washings and aspirates are unacceptable for Xpert Xpress SARS-CoV-2/FLU/RSV testing.  Fact Sheet for Patients: EntrepreneurPulse.com.au  Fact Sheet for Healthcare Providers: IncredibleEmployment.be  This test is not yet approved or cleared by the [place] FDA and has been authorized for detection and/or diagnosis of SARS-CoV-2 by FDA under an Emergency Use Authorization (EUA). This EUA will remain in effect (meaning this test can be used) for the duration of the COVID-19 declaration under Section  564(b)(1) of the Act, 21 U.S.C. section 360bbb-3(b)(1), unless the authorization is terminated or revoked.  Performed at [facility], [place] [address] [place], [place] [address]      Time coordinating discharge: 45 minutes  SIGNED:   Tawni Millers, MD  [facility] 10/27/2021, 9:26 AM

## 2021-11-20 ENCOUNTER — Encounter: Payer: Self-pay | Admitting: Gastroenterology

## 2021-12-10 ENCOUNTER — Ambulatory Visit (INDEPENDENT_AMBULATORY_CARE_PROVIDER_SITE_OTHER): Payer: Medicare Other | Admitting: Gastroenterology

## 2021-12-10 ENCOUNTER — Encounter: Payer: Self-pay | Admitting: Gastroenterology

## 2021-12-10 VITALS — BP 124/76 | HR 84 | Ht 72.0 in | Wt 193.0 lb

## 2021-12-10 DIAGNOSIS — R140 Abdominal distension (gaseous): Secondary | ICD-10-CM | POA: Insufficient documentation

## 2021-12-10 DIAGNOSIS — R109 Unspecified abdominal pain: Secondary | ICD-10-CM | POA: Diagnosis not present

## 2021-12-10 NOTE — Progress Notes (Signed)
Of note if he has not had a fecal pancreatic elastase otherwise I may consider sending that as well.

## 2021-12-10 NOTE — Progress Notes (Signed)
Agree with assessment as outlined. Difficult situation, I think largely functional bloating disorder.  He needs to completely abstain from alcohol, he was admitted directly from clinic for alcohol intoxication and went through withdrawal in the hospital.  Next thing I would consider for him would be a trial of Cymbalta which has been used for refractory bloating and functional pain, but he would need to be compliant with it.  Can await trial of IBgard in the interim.

## 2021-12-10 NOTE — Patient Instructions (Signed)
Start IBgard twice daily.   Follow up with Dr. Havery Moros.  If you are age 80 or older, your body mass index should be between 23-30. Your Body mass index is 26.18 kg/m. If this is out of the aforementioned range listed, please consider follow up with your Primary Care Provider.  If you are age 71 or younger, your body mass index should be between 19-25. Your Body mass index is 26.18 kg/m. If this is out of the aformentioned range listed, please consider follow up with your Primary Care Provider.   ________________________________________________________  The [facility] providers would like to encourage you to use [facility] to communicate with providers for non-urgent requests or questions.  Due to long hold times on the telephone, sending your provider a message by [facility] may be a faster and more efficient way to get a response.  Please allow 48 business hours for a response.  Please remember that this is for non-urgent requests.  _______________________________________________________

## 2021-12-10 NOTE — Progress Notes (Signed)
12/10/2021 Kenneth Roberts [id number] 01-03-1941   HISTORY OF PRESENT ILLNESS: This is a 81 year old male who is a patient Kenneth Roberts.  He has had chronic complaints of abdominal discomfort and bloating.  Has history of alcohol abuse and actually at his last visit in October 2022 he was acutely intoxicated and sent to the emergency department.  He has had extensive evaluation to date with prior endoscopy, colonoscopy, capsule study, multiple CT scans and lab work, gastric emptying scan, etc.  He has had trials of probiotics, TCAs, buspirone, xifaxan in the past.  He is here again today with complaints of generalized abdominal bloating/distention and discomfort.  He says that the bloating is present all the time, does not change, and is not affected by eating.  He is a difficult historian.  He points to his abdomen and says "this is not supposed to be there".  He tells me that he is drinking "some beer".  He tells me that he has buddies who drink as much or more than him and do not have this look to with their abdomens.  Reports some loose stools, sometimes up to 5 bowel movements a day.  He says that he has been drinking some Pepto-Bismol.  That has caused his stools to be dark in color.  He asked me if we do anything with warts here and asked me where he would go to take care of the warts on his penis.  Most recent workup: Colonoscopy 02/26/16 - diverticulosis, hemorrhoids, 21mm ascending colon polyp which was benign inflammatory polyp Colonoscopy 01/02/2016 - poor prep EGD 5/16 - irregular z-line, small hiatal hernia, normal stomach and duodenum - biopsies normal, no BE or celiac Colonoscopy May 2016 - poor prep Colonoscopy 2010 - adenoma  CT scan 01/2016 - no cause for symptoms noted, benign hepatic hemagioma UGI series 2008-  - nonspecific dysmotility, 70mm tablet got stuck at aortic arch Gastric emptying study 06/03/2017 - normal CT scan abdomen / pelvis with contrast 07/23/2017 - 1.8cm  hemangioma left hepatic lobe, mild DJD, no acute findings CT scan 07/17/19 - stable 2.2cm hepatic hemangioma, no concerning pathology   3 CTs for abdominal pain with ED visits since our last visit:   CT abdomen / pelvis with contrast 10/03/19 -IMPRESSION: 1. No CT evidence for acute intra-abdominal pathology, specifically no current evidence of acute pancreatitis. 2. Probable hepatic steatosis. 3. Stable left hepatic lobe hemangioma.     CT abdomen / pelvis with contrast 01/14/21 - IMPRESSION: 1. No acute intra-abdominal or intrapelvic abnormality. 2. Similar-appearing 1.9 cm hypodense lesion within left hepatic lobe consistent with a known hepatic hemangioma.     CT abdomen pelvis with contrast 03/09/21 - IMPRESSION: 1. No acute intra-abdominal or pelvic pathology. 2. A 13 mm indeterminate hypodense nodule in the tail of the pancreas. Further characterization with MRI without and with contrast on a nonemergent/outpatient basis recommended. 3. Fatty liver. 4. A 2 cm nodular enhancement in the left lobe of the liver similar to prior CT, likely cysts a hemangioma or vascular shunting. Attention on follow-up imaging recommended. 5. Aortic Atherosclerosis (ICD10-I70.0).  CT of the abdomen/pelvis with contrast on 08/30/2021:  IMPRESSION: 1. No acute finding in the abdomen or pelvis. No evidence of bowel ischemia. Patent aorta and major branch vessels. 2. Fatty liver. 3. Patchy opacities in the lung bases probably predominantly reflect atelectasis, but there is a more consolidative appearing opacity in the anterior portion of the left lower lobe which is suspicious for pneumonia.  Past Medical History:  Diagnosis Date   Adenomatous colon polyp 01/2009   Anemia    BPH (benign prostatic hypertrophy)    Cataract    Chronic headaches    Colon polyps    Fatty liver    GERD (gastroesophageal reflux disease)    Heartburn    Hepatic hemangioma    Hiatal hernia    Hypertension     Internal hemorrhoids    Leukopenia    Pulmonary embolism ([place])    Past Surgical History:  Procedure Laterality Date   CATARACT EXTRACTION     Left eye   COLONOSCOPY      reports that he quit smoking about 40 years ago. His smoking use included cigarettes. He has a 8.75 pack-year smoking history. He has never used smokeless tobacco. He reports current alcohol use. He reports that he does not use drugs. family history includes Diabetes in his brother, father, and sister; Stomach cancer in his mother. Allergies  Allergen Reactions   Gadolinium Derivatives Other (See Comments)    unknown   Prilosec [Omeprazole] Itching      Outpatient Encounter Medications as of 12/10/2021  Medication Sig   dorzolamide-timolol (COSOPT) 22.3-6.8 MG/ML ophthalmic solution Place 1 drop into both eyes 2 (two) times daily.   losartan (COZAAR) 50 MG tablet TAKE 1 TABLET (50 MG TOTAL) BY MOUTH DAILY. (Patient taking differently: Take 50 mg by mouth daily.)   pantoprazole (PROTONIX) 40 MG tablet Take 40 mg by mouth daily.   traZODone (DESYREL) 100 MG tablet Take 100 mg by mouth at bedtime.   [DISCONTINUED] hydrOXYzine (ATARAX) 50 MG tablet Take 50 mg by mouth 2 (two) times daily.   [DISCONTINUED] oxybutynin (DITROPAN) 5 MG tablet Take 5 mg by mouth daily.   No facility-administered encounter medications on file as of 12/10/2021.    REVIEW OF SYSTEMS  : All other systems reviewed and negative except where noted in the History of Present Illness.   PHYSICAL EXAM: BP 124/76    Pulse 84    Ht 6' (1.829 m)    Wt 193 lb (87.5 kg)    BMI 26.18 kg/m  General: Well developed AA male in no acute distress Head: Normocephalic and atraumatic Eyes:  Sclerae anicteric, conjunctiva pink. Ears: Normal auditory acuity Lungs: Clear throughout to auscultation; no W/R/R. Heart: Regular rate and rhythm; no M/R/G. Abdomen: Soft, non-distended.  BS present.  Non-tender. Musculoskeletal: Symmetrical with no gross deformities   Skin: No lesions on visible extremities Extremities: No edema  Neurological: Alert oriented x 4, grossly non-focal Psychological:  Alert and cooperative. 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  Has tried probiotics and TCAs and buspirone in the past without much benefit and has tried Diplomatic Services operational officer as well.  Thought was that may be his alcohol use was playing into his symptoms.  He is asking about a colonoscopy.  I told him that I do not know if that would necessarily be of benefit being that CT scans have not shown any concerning issues.  Also with his advancing age and his alcohol use and not sure if it is a good idea to proceed.  I have asked him to try IBgard twice daily.  Unfortunately do not have samples today.  I am going to have him come back and follow-up with Dr.  Armbruster as I am not sure where else to go with his symptoms from here. *Genital warts: Advised that he would likely see urology or dermatology about this.  He can discuss further with his PCP whom he sees next week.   CC:  Sonia Side., FNP

## 2021-12-20 ENCOUNTER — Encounter: Payer: Self-pay | Admitting: Podiatry

## 2021-12-20 ENCOUNTER — Other Ambulatory Visit: Payer: Self-pay

## 2021-12-20 ENCOUNTER — Ambulatory Visit (INDEPENDENT_AMBULATORY_CARE_PROVIDER_SITE_OTHER): Payer: Medicare Other | Admitting: Podiatry

## 2021-12-20 DIAGNOSIS — L84 Corns and callosities: Secondary | ICD-10-CM | POA: Diagnosis not present

## 2021-12-20 DIAGNOSIS — B351 Tinea unguium: Secondary | ICD-10-CM

## 2021-12-20 DIAGNOSIS — M79676 Pain in unspecified toe(s): Secondary | ICD-10-CM

## 2021-12-20 DIAGNOSIS — M216X9 Other acquired deformities of unspecified foot: Secondary | ICD-10-CM

## 2021-12-20 NOTE — Progress Notes (Signed)
This patient returns to the office for evaluation and treatment of long thick painful nails .  This patient is unable to trim his own nails since the patient cannot reach his feet.  Patient says the nails are painful walking and wearing his shoes.    He returns for preventive foot care services.  General Appearance  Alert, conversant and in no acute stress.  Vascular  Dorsalis pedis and posterior tibial  pulses are palpable  bilaterally.  Capillary return is within normal limits  bilaterally. Temperature is within normal limits  bilaterally.  Neurologic  Senn-Weinstein monofilament wire test within normal limits  bilaterally. Muscle power within normal limits bilaterally.  Nails Thick disfigured discolored nails with subungual debris  from hallux to fifth toes bilaterally. No evidence of bacterial infection or drainage bilaterally.  Orthopedic  No limitations of motion  feet .  No crepitus or effusions noted.  No bony pathology or digital deformities noted.  Prominent fifth metatarsal  B/L. Above ankle swelling  B/L.  Skin  normotropic skin with no porokeratosis noted bilaterally.  No signs of infections or ulcers noted.  Symptomatic callus sub 5th  B/L.  Onychomycosis  Pain in toes right foot  Pain in toes left foot  Callus sub 5th  B/L.  Debridement  of nails  1-5  B/L with a nail nipper.  Nails were then filed using a dremel tool with no incidents.  Debride callus with # 15 blade and dremel tool..   RTC  10 weeks     Lajoyce Tamura DPM  

## 2022-01-14 ENCOUNTER — Ambulatory Visit: Payer: Medicare Other | Admitting: Gastroenterology

## 2022-02-28 ENCOUNTER — Ambulatory Visit: Payer: Medicare Other | Admitting: Podiatry

## 2022-03-03 ENCOUNTER — Telehealth: Payer: Self-pay | Admitting: Podiatry

## 2022-03-03 NOTE — Telephone Encounter (Signed)
Pt left 2 messages yesterda(4.30.2023) asking for a call back about his appt that was to be scheduled for Friday 5.5 @ 800.Marland Kitchen He called and he could hear [place] but we could not hear him. ? ?Upon checking I seen he is scheduled for 5.5.@ 815 and left message for pt with appt information. ?

## 2022-03-07 ENCOUNTER — Ambulatory Visit: Payer: Medicare Other | Admitting: Podiatry

## 2022-03-14 ENCOUNTER — Encounter: Payer: Self-pay | Admitting: Podiatry

## 2022-03-14 ENCOUNTER — Ambulatory Visit (INDEPENDENT_AMBULATORY_CARE_PROVIDER_SITE_OTHER): Payer: Medicare Other | Admitting: Podiatry

## 2022-03-14 DIAGNOSIS — M216X9 Other acquired deformities of unspecified foot: Secondary | ICD-10-CM | POA: Diagnosis not present

## 2022-03-14 DIAGNOSIS — R609 Edema, unspecified: Secondary | ICD-10-CM

## 2022-03-14 DIAGNOSIS — M79675 Pain in left toe(s): Secondary | ICD-10-CM | POA: Diagnosis not present

## 2022-03-14 DIAGNOSIS — M79674 Pain in right toe(s): Secondary | ICD-10-CM | POA: Diagnosis not present

## 2022-03-14 DIAGNOSIS — L84 Corns and callosities: Secondary | ICD-10-CM

## 2022-03-14 DIAGNOSIS — B351 Tinea unguium: Secondary | ICD-10-CM

## 2022-03-14 NOTE — Progress Notes (Signed)
This patient returns to the office for evaluation and treatment of long thick painful nails .  This patient is unable to trim his own nails since the patient cannot reach his feet.  Patient says the nails are painful walking and wearing his shoes.    He returns for preventive foot care services. ? ?General Appearance  Alert, conversant and in no acute stress. ? ?Vascular  Dorsalis pedis and posterior tibial  pulses are palpable  bilaterally.  Capillary return is within normal limits  bilaterally. Temperature is within normal limits  bilaterally. ? ?Neurologic  Senn-Weinstein monofilament wire test within normal limits  bilaterally. Muscle power within normal limits bilaterally. ? ?Nails Thick disfigured discolored nails with subungual debris  from hallux to fifth toes bilaterally. No evidence of bacterial infection or drainage bilaterally. ? ?Orthopedic  No limitations of motion  feet .  No crepitus or effusions noted.  No bony pathology or digital deformities noted.  Prominent fifth metatarsal  B/L. Above ankle swelling  B/L. ? ?Skin  normotropic skin with no porokeratosis noted bilaterally.  No signs of infections or ulcers noted.  Symptomatic callus sub 5th  B/L. ? ?Onychomycosis  Pain in toes right foot  Pain in toes left foot  Callus sub 5th  B/L. ? ?Debridement  of nails  1-5  B/L with a nail nipper.  Nails were then filed using a dremel tool with no incidents.  Debride callus with # 15 blade and dremel tool.Marland Kitchen   RTC  10 weeks  Patient is concerned about his swelling and he was told to check with his medical doctor. ? ? ?Gardiner Barefoot DPM  ?

## 2022-03-18 ENCOUNTER — Ambulatory Visit: Payer: Medicare Other | Admitting: Gastroenterology

## 2022-03-18 NOTE — Progress Notes (Deleted)
HPI :  81 year old male who is a patient Dr. Doyne Keel.  He has had chronic complaints of abdominal discomfort and bloating.  Has history of alcohol abuse and actually at his last visit in October 2022 he was acutely intoxicated and sent to the emergency department.  He has had extensive evaluation to date with prior endoscopy, colonoscopy, capsule study, multiple CT scans and lab work, gastric emptying scan, etc.  He has had trials of probiotics, TCAs, buspirone, xifaxan in the past.   He is here again today with complaints of generalized abdominal bloating/distention and discomfort.  He says that the bloating is present all the time, does not change, and is not affected by eating.  He is a difficult historian.  He points to his abdomen and says "this is not supposed to be there".  He tells me that he is drinking "some beer".  He tells me that he has buddies who drink as much or more than him and do not have this look to with their abdomens.  Reports some loose stools, sometimes up to 5 bowel movements a day.  He says that he has been drinking some Pepto-Bismol.  That has caused his stools to be dark in color.   He asked me if we do anything with warts here and asked me where he would go to take care of the warts on his penis.   Most recent workup: Colonoscopy 02/26/16 - diverticulosis, hemorrhoids, 15m ascending colon polyp which was benign inflammatory polyp Colonoscopy 01/02/2016 - poor prep EGD 5/16 - irregular z-line, small hiatal hernia, normal stomach and duodenum - biopsies normal, no BE or celiac Colonoscopy May 2016 - poor prep Colonoscopy 2010 - adenoma  CT scan 01/2016 - no cause for symptoms noted, benign hepatic hemagioma UGI series 2008-  - nonspecific dysmotility, 154mtablet got stuck at aortic arch Gastric emptying study 06/03/2017 - normal CT scan abdomen / pelvis with contrast 07/23/2017 - 1.8cm hemangioma left hepatic lobe, mild DJD, no acute findings CT scan 07/17/19 - stable  2.2cm hepatic hemangioma, no concerning pathology   3 CTs for abdominal pain with ED visits since our last visit:   CT abdomen / pelvis with contrast 10/03/19 -IMPRESSION: 1. No CT evidence for acute intra-abdominal pathology, specifically no current evidence of acute pancreatitis. 2. Probable hepatic steatosis. 3. Stable left hepatic lobe hemangioma.     CT abdomen / pelvis with contrast 01/14/21 - IMPRESSION: 1. No acute intra-abdominal or intrapelvic abnormality. 2. Similar-appearing 1.9 cm hypodense lesion within left hepatic lobe consistent with a known hepatic hemangioma.     CT abdomen pelvis with contrast 03/09/21 - IMPRESSION: 1. No acute intra-abdominal or pelvic pathology. 2. A 13 mm indeterminate hypodense nodule in the tail of the pancreas. Further characterization with MRI without and with contrast on a nonemergent/outpatient basis recommended. 3. Fatty liver. 4. A 2 cm nodular enhancement in the left lobe of the liver similar to prior CT, likely cysts a hemangioma or vascular shunting. Attention on follow-up imaging recommended. 5. Aortic Atherosclerosis (ICD10-I70.0).   CT of the abdomen/pelvis with contrast on 08/30/2021:  IMPRESSION: 1. No acute finding in the abdomen or pelvis. No evidence of bowel ischemia. Patent aorta and major branch vessels. 2. Fatty liver. 3. Patchy opacities in the lung bases probably predominantly reflect atelectasis, but there is a more consolidative appearing opacity in the anterior portion of the left lower lobe which is suspicious for pneumonia.    Generalized abdominal bloating and generalized abdominal discomfort  in the setting of alcohol abuse: This is thought to be a chronic functional disorder based on chronicity of symptoms and negative extensive work-up previously.  Has tried probiotics and TCAs and buspirone in the past without much benefit and has tried Diplomatic Services operational officer as well.  Thought was that may be his alcohol use was playing  into his symptoms.  He is asking about a colonoscopy.  I told him that I do not know if that would necessarily be of benefit being that CT scans have not shown any concerning issues.  Also with his advancing age and his alcohol use and not sure if it is a good idea to proceed.  I have asked him to try IBgard twice daily.  Unfortunately do not have samples today.  I am going to have him come back and follow-up with Dr. Havery Moros as I am not sure where else to go with his symptoms from here. *Genital warts: Advised that he would likely see urology or dermatology about this.  He can discuss further with his PCP whom he sees next week   Agree with assessment as outlined. Difficult situation, I think largely functional bloating disorder.  He needs to completely abstain from alcohol, he was admitted directly from clinic for alcohol intoxication and went through withdrawal in the hospital.  Next thing I would consider for him would be a trial of Cymbalta which has been used for refractory bloating and functional pain, but he would need to be compliant with it.  Can await trial of IBgard in the interim. Of note if he has not had a fecal pancreatic elastase otherwise I may consider sending that as well.       Past Medical History:  Diagnosis Date   Adenomatous colon polyp 01/2009   Anemia    BPH (benign prostatic hypertrophy)    Cataract    Chronic headaches    Colon polyps    Fatty liver    GERD (gastroesophageal reflux disease)    Heartburn    Hepatic hemangioma    Hiatal hernia    Hypertension    Internal hemorrhoids    Leukopenia    Pulmonary embolism ([facility])      Past Surgical History:  Procedure Laterality Date   CATARACT EXTRACTION     Left eye   COLONOSCOPY     Family History  Problem Relation Age of Onset   Stomach cancer Mother    Diabetes Father    Diabetes Brother    Diabetes Sister    Colon cancer Neg Hx    Colon polyps Neg Hx    Esophageal cancer Neg Hx    Social  History   Tobacco Use   Smoking status: Former    Packs/day: 0.25    Years: 35.00    Pack years: 8.75    Types: Cigarettes    Quit date: 02/16/1981    Years since quitting: 41.1   Smokeless tobacco: Never  Vaping Use   Vaping Use: Never used  Substance Use Topics   Alcohol use: Yes    Comment: "been drinking a lot"   Drug use: No   Current Outpatient Medications  Medication Sig Dispense Refill   dorzolamide-timolol (COSOPT) 22.3-6.8 MG/ML ophthalmic solution Place 1 drop into both eyes 2 (two) times daily.     losartan (COZAAR) 50 MG tablet TAKE 1 TABLET (50 MG TOTAL) BY MOUTH DAILY. (Patient taking differently: Take 50 mg by mouth daily.) 90 tablet 0   pantoprazole (PROTONIX) 40 MG tablet  Take 40 mg by mouth daily.     traZODone (DESYREL) 100 MG tablet Take 100 mg by mouth at bedtime.     No current facility-administered medications for this visit.   Allergies  Allergen Reactions   Gadolinium Derivatives Other (See Comments)    unknown   Prilosec [Omeprazole] Itching     Review of Systems: All systems reviewed and negative except where noted in HPI.    No results found.  Physical Exam: There were no vitals taken for this visit. Constitutional: Pleasant,well-developed, ***male in no acute distress. HEENT: Normocephalic and atraumatic. Conjunctivae are normal. No scleral icterus. Neck supple.  Cardiovascular: Normal rate, regular rhythm.  Pulmonary/chest: Effort normal and breath sounds normal. No wheezing, rales or rhonchi. Abdominal: Soft, nondistended, nontender. Bowel sounds active throughout. There are no masses palpable. No hepatomegaly. Extremities: no edema Lymphadenopathy: No cervical adenopathy noted. Neurological: Alert and oriented to person place and time. Skin: Skin is warm and dry. No rashes noted. Psychiatric: Normal mood and affect. Behavior is normal.   ASSESSMENT AND PLAN:  Sonia Side., FNP

## 2022-04-14 ENCOUNTER — Ambulatory Visit: Payer: Medicare Other | Admitting: Gastroenterology

## 2022-04-24 ENCOUNTER — Other Ambulatory Visit: Payer: Self-pay | Admitting: Internal Medicine

## 2022-04-30 LAB — CBC WITH DIFFERENTIAL/PLATELET
Absolute Monocytes: 673 cells/uL (ref 200–950)
Basophils Absolute: 40 cells/uL (ref 0–200)
Basophils Relative: 0.9 %
Eosinophils Absolute: 48 cells/uL (ref 15–500)
Eosinophils Relative: 1.1 %
HCT: 34.5 % — ABNORMAL LOW (ref 38.5–50.0)
Hemoglobin: 11.8 g/dL — ABNORMAL LOW (ref 13.2–17.1)
Lymphs Abs: 1219 cells/uL (ref 850–3900)
MCH: 31.6 pg (ref 27.0–33.0)
MCHC: 34.2 g/dL (ref 32.0–36.0)
MCV: 92.5 fL (ref 80.0–100.0)
MPV: 11.5 fL (ref 7.5–12.5)
Monocytes Relative: 15.3 %
Neutro Abs: 2420 cells/uL (ref 1500–7800)
Neutrophils Relative %: 55 %
Platelets: 200 10*3/uL (ref 140–400)
RBC: 3.73 10*6/uL — ABNORMAL LOW (ref 4.20–5.80)
RDW: 19.4 % — ABNORMAL HIGH (ref 11.0–15.0)
Total Lymphocyte: 27.7 %
WBC: 4.4 10*3/uL (ref 3.8–10.8)

## 2022-04-30 LAB — IRON, TOTAL/TOTAL IRON BINDING CAP

## 2022-04-30 LAB — RETICULOCYTES
ABS Retic: 52220 cells/uL (ref 25000–90000)
Retic Ct Pct: 1.4 %

## 2022-04-30 LAB — HEMOGLOBINOPATHY EVALUATION
Fetal Hemoglobin Testing: 1.1 % (ref 0.0–1.9)
HCT: 35.6 % — ABNORMAL LOW (ref 38.5–50.0)
Hemoglobin A2 - HGBRFX: 3 % (ref 2.2–3.2)
Hemoglobin: 11.5 g/dL — ABNORMAL LOW (ref 13.2–17.1)
Hgb A: 56.2 % — ABNORMAL LOW (ref >96.0)
Hgb S Quant: 39.7 % — ABNORMAL HIGH
MCH: 30.3 pg (ref 27.0–33.0)
MCV: 93.9 fL (ref 80.0–100.0)
RBC: 3.79 10*6/uL — ABNORMAL LOW (ref 4.20–5.80)
RDW: 18.5 % — ABNORMAL HIGH (ref 11.0–15.0)

## 2022-04-30 LAB — FERRITIN

## 2022-04-30 LAB — B12 AND FOLATE PANEL

## 2022-04-30 LAB — SICKLE CELL SCREEN: Sickle Solubility Test - HGBRFX: POSITIVE — AB

## 2022-05-02 ENCOUNTER — Ambulatory Visit: Payer: Medicare Other | Admitting: Physician Assistant

## 2022-05-23 ENCOUNTER — Ambulatory Visit: Payer: Medicare Other | Admitting: Podiatry

## 2022-06-02 ENCOUNTER — Ambulatory Visit (INDEPENDENT_AMBULATORY_CARE_PROVIDER_SITE_OTHER): Payer: Medicare Other | Admitting: Nurse Practitioner

## 2022-06-02 ENCOUNTER — Encounter: Payer: Self-pay | Admitting: Nurse Practitioner

## 2022-06-02 VITALS — BP 128/84 | HR 81 | Ht 70.0 in | Wt 186.0 lb

## 2022-06-02 DIAGNOSIS — R140 Abdominal distension (gaseous): Secondary | ICD-10-CM | POA: Diagnosis not present

## 2022-06-02 NOTE — Progress Notes (Signed)
Agree with assessment and plan as outlined.  

## 2022-06-02 NOTE — Progress Notes (Signed)
Chief Complaint:  bloating   Assessment &  Plan   81 yo male with chronic bloating and chronic abdominal pain refractory to probiotics, TCAs, buspirone. Unrevealing extensive evaluation including prior endoscopy, colonoscopy, capsule study, multiple CT scans and lab work, GES. Actually IBgard given at Feb 2023 visit works great but he has a hard time affording it.   Use the samples of IBgard given today. Then can try the samples of FDgard. If FDgard is effective then call us for more samples. Also, coupons given for IBgard.    HPI   Patient is a 81 year old male known to Dr. Havery Moros.  He has a past medical history of PE, HTN, Etoh abuse, GERD, colon polyps. See PMH for additional history.   Patient was last seen in February 2023 by Alonza Bogus, PA for evaluation of chronic abdominal pain / bloating. He has had an extensive evaluation with prior endoscopy, colonoscopy, capsule study, multiple CT scans and lab work, 4 hour gastric emptying scan, etc.  He has had trials of probiotics, TCAs, buspirone, xifaxan in the past. He was started in [place] .    Interval History:  He has felt much better on IBgard. No significant bloating or abdominal pain when he takes it. He is having 2-3 BMs a day. Unfortunately it is difficult for him to the IBgard. He has been out of the supplement for a few weeks and back to having postprandial bloating and generalized abdominal discomfort for which he has to take Mylanta and Pepto.   Endoscopies:   May 2016 EGD  irregular Z line. Biopsies pending to rule out Barrett's esophagus 2. small 1 cm reducible hiatal hernia 3. Small bowel biopsies to rule out villous atrophy Duodenum, Biopsy - BENIGN SMALL BOWEL MUCOSA. - NO EVIDENCE OF VILLOUS BLUNTING, SIGNIFICANT INFLAMMATION, DYSPLASIA OR MALIGNANCY. 2. Surgical [P], GE junction - SQUAMOCOLUMNAR MUCOSA WITH SCATTERED CHRONIC INFLAMMATION AND REACTIVE CHANGES. - NO INTESTINAL METAPLASIA, DYSPLASIA, OR  MALIGNANCY. - SEE COMMENT   April 2017 Colonoscopy  - One 5 mm polyp in the ascending colon, removed with a cold snare. Resected and retrieved. - Diverticulosis in the ascending colon. - Non-bleeding internal hemorrhoids. - The examination was otherwise normal  Diagnosis Surgical [P], ascending, polyp - INFLAMMATORY TYPE POLYP. - THERE IS NO EVIDENCE OF MALIGNANCY    Labs:     Latest Ref Rng & Units 04/24/2022   12:00 AM 10/27/2021    7:17 AM 10/26/2021    1:03 PM  CBC  WBC 3.8 - 10.8 Thousand/uL 4.4  3.7  4.7   Hemoglobin 13.2 - 17.1 g/dL 13.2 - 17.1 g/dL 11.8    11.5  11.3  12.9   Hematocrit 38.5 - 50.0 % 38.5 - 50.0 % 34.5    35.6  31.7  36.2   Platelets 140 - 400 Thousand/uL 200  170  225        Latest Ref Rng & Units 10/26/2021    1:03 PM 08/31/2021    3:12 AM 08/30/2021   10:50 AM  Hepatic Function  Total Protein 6.5 - 8.1 g/dL 8.1  7.4  8.4   Albumin 3.5 - 5.0 g/dL 4.3  3.6  4.1   AST 15 - 41 U/L 40  22  26   ALT 0 - 44 U/L _0 Alk Phosphatase 38 - 126 U/L 41  45  50   Total Bilirubin 0.3 - 1.2 mg/dL 1.4  0.9  0.7  Past Medical History:  Diagnosis Date   Adenomatous colon polyp 01/2009   Anemia    BPH (benign prostatic hypertrophy)    Cataract    Chronic headaches    Colon polyps    Fatty liver    GERD (gastroesophageal reflux disease)    Heartburn    Hepatic hemangioma    Hiatal hernia    Hypertension    Internal hemorrhoids    Leukopenia    Pulmonary embolism ([facility])     Past Surgical History:  Procedure Laterality Date   CATARACT EXTRACTION     Left eye   COLONOSCOPY      Current Medications, Allergies, Family History and Social History were reviewed in [facility] record.     Current Outpatient Medications  Medication Sig Dispense Refill   dorzolamide-timolol (COSOPT) 22.3-6.8 MG/ML ophthalmic solution Place 1 drop into both eyes 2 (two) times daily.     pantoprazole (PROTONIX) 40 MG tablet Take  40 mg by mouth daily.     traZODone (DESYREL) 100 MG tablet Take 100 mg by mouth at bedtime.     losartan (COZAAR) 50 MG tablet TAKE 1 TABLET (50 MG TOTAL) BY MOUTH DAILY. (Patient taking differently: Take 50 mg by mouth daily.) 90 tablet 0   No current facility-administered medications for this visit.    Review of Systems: No chest pain. No shortness of breath. No urinary complaints.    Physical Exam  Wt Readings from Last 3 Encounters:  06/02/22 186 lb (84.4 kg)  12/10/21 193 lb (87.5 kg)  10/27/21 196 lb 3.4 oz (89 kg)    BP 128/84   Pulse 81   Ht _0  (1.778 m)   Wt 186 lb (84.4 kg)   SpO2 98%   BMI 26.69 kg/m  Constitutional:  Generally well appearing male in no acute distress. Psychiatric: Pleasant. Normal mood and affect. Behavior is normal. EENT: Pupils normal.  Conjunctivae are normal. No scleral icterus. Neck supple.  Cardiovascular: Normal rate, regular rhythm. No edema Pulmonary/chest: Effort normal and breath sounds normal. No wheezing, rales or rhonchi. Abdominal: Soft, nondistended, nontender. Bowel sounds active throughout. There are no masses palpable. No hepatomegaly. Neurological: Alert and oriented to person place and time. Skin: Skin is warm and dry. No rashes noted.  Tye Savoy, NP  06/02/2022, 10:08 AM

## 2022-06-02 NOTE — Patient Instructions (Addendum)
If you are age 79 or older, your body mass index should be between 23-30. Your Body mass index is 26.69 kg/m. If this is out of the aforementioned range listed, please consider follow up with your Primary Care Provider.  If you are age 56 or younger, your body mass index should be between 19-25. Your Body mass index is 26.69 kg/m. If this is out of the aformentioned range listed, please consider follow up with your Primary Care Provider.   ________________________________________________________  The [facility] providers would like to encourage you to use [facility] to communicate with providers for non-urgent requests or questions.  Due to long hold times on the telephone, sending your provider a message by [facility] may be a faster and more efficient way to get a response.  Please allow 48 business hours for a response.  Please remember that this is for non-urgent requests.  _______________________________________________________   Use the samples of IBgard given today. Then can try the FDgard. If FDgard works just as well then call us for more samples. Remember to look in the sample box as some of them contain coupons.   Due to recent changes in healthcare laws, you may see the results of your imaging and laboratory studies on MyChart before your provider has had a chance to review them.  We understand that in some cases there may be results that are confusing or concerning to you. Not all laboratory results come back in the same time frame and the provider may be waiting for multiple results in order to interpret others.  Please give [place] 48 hours in order for your provider to thoroughly review all the results before contacting the office for clarification of your results.    Follow up with [place] as needed.   It was a pleasure to see you today!  Thank you for trusting me with your gastrointestinal care!

## 2022-06-03 ENCOUNTER — Encounter: Payer: Self-pay | Admitting: Podiatry

## 2022-06-03 ENCOUNTER — Ambulatory Visit (INDEPENDENT_AMBULATORY_CARE_PROVIDER_SITE_OTHER): Payer: Medicare Other | Admitting: Podiatry

## 2022-06-03 DIAGNOSIS — B351 Tinea unguium: Secondary | ICD-10-CM | POA: Diagnosis not present

## 2022-06-03 DIAGNOSIS — M216X9 Other acquired deformities of unspecified foot: Secondary | ICD-10-CM

## 2022-06-03 DIAGNOSIS — L84 Corns and callosities: Secondary | ICD-10-CM | POA: Diagnosis not present

## 2022-06-03 DIAGNOSIS — M79676 Pain in unspecified toe(s): Secondary | ICD-10-CM

## 2022-06-03 NOTE — Progress Notes (Signed)
This patient returns to the office for evaluation and treatment of long thick painful nails .  This patient is unable to trim his own nails since the patient cannot reach his feet.  Patient says the nails are painful walking and wearing his shoes.    He returns for preventive foot care services.  General Appearance  Alert, conversant and in no acute stress.  Vascular  Dorsalis pedis and posterior tibial  pulses are palpable  bilaterally.  Capillary return is within normal limits  bilaterally. Temperature is within normal limits  bilaterally.  Neurologic  Senn-Weinstein monofilament wire test within normal limits  bilaterally. Muscle power within normal limits bilaterally.  Nails Thick disfigured discolored nails with subungual debris  from hallux to fifth toes bilaterally. No evidence of bacterial infection or drainage bilaterally.  Orthopedic  No limitations of motion  feet .  No crepitus or effusions noted.  No bony pathology or digital deformities noted.  Prominent fifth metatarsal  B/L. Above ankle swelling  B/L.  Skin  normotropic skin with no porokeratosis noted bilaterally.  No signs of infections or ulcers noted.  Symptomatic callus sub 5th  B/L.  Onychomycosis  Pain in toes right foot  Pain in toes left foot  Callus sub 5th  B/L.  Debridement  of nails  1-5  B/L with a nail nipper.  Nails were then filed using a dremel tool with no incidents.  Debride callus with # 15 blade and dremel tool..   RTC  10 weeks     Jaquille Kau DPM  

## 2022-08-29 ENCOUNTER — Encounter: Payer: Self-pay | Admitting: Podiatry

## 2022-08-29 ENCOUNTER — Ambulatory Visit (INDEPENDENT_AMBULATORY_CARE_PROVIDER_SITE_OTHER): Payer: Medicare Other | Admitting: Podiatry

## 2022-08-29 DIAGNOSIS — M79676 Pain in unspecified toe(s): Secondary | ICD-10-CM | POA: Diagnosis not present

## 2022-08-29 DIAGNOSIS — B351 Tinea unguium: Secondary | ICD-10-CM | POA: Diagnosis not present

## 2022-08-29 DIAGNOSIS — L84 Corns and callosities: Secondary | ICD-10-CM

## 2022-08-29 DIAGNOSIS — M216X9 Other acquired deformities of unspecified foot: Secondary | ICD-10-CM

## 2022-08-29 NOTE — Progress Notes (Signed)
This patient returns to the office for evaluation and treatment of long thick painful nails .  This patient is unable to trim his own nails since the patient cannot reach his feet.  Patient says the nails are painful walking and wearing his shoes.    He returns for preventive foot care services.  General Appearance  Alert, conversant and in no acute stress.  Vascular  Dorsalis pedis and posterior tibial  pulses are palpable  bilaterally.  Capillary return is within normal limits  bilaterally. Temperature is within normal limits  bilaterally.  Neurologic  Senn-Weinstein monofilament wire test within normal limits  bilaterally. Muscle power within normal limits bilaterally.  Nails Thick disfigured discolored nails with subungual debris  from hallux to fifth toes bilaterally. No evidence of bacterial infection or drainage bilaterally.  Orthopedic  No limitations of motion  feet .  No crepitus or effusions noted.  No bony pathology or digital deformities noted.  Prominent fifth metatarsal  B/L. Above ankle swelling  B/L.  Skin  normotropic skin with no porokeratosis noted bilaterally.  No signs of infections or ulcers noted.  Symptomatic callus sub 5th  B/L.  Onychomycosis  Pain in toes right foot  Pain in toes left foot  Callus sub 5th  B/L.  Debridement  of nails  1-5  B/L with a nail nipper.  Nails were then filed using a dremel tool with no incidents.  Debride callus with # 15 blade and dremel tool.Marland Kitchen   RTC  10 weeks     Gardiner Barefoot DPM

## 2022-12-05 ENCOUNTER — Ambulatory Visit: Payer: Medicare Other | Admitting: Podiatry

## 2022-12-08 ENCOUNTER — Ambulatory Visit (INDEPENDENT_AMBULATORY_CARE_PROVIDER_SITE_OTHER): Payer: 59 | Admitting: Podiatry

## 2022-12-08 ENCOUNTER — Encounter: Payer: Self-pay | Admitting: Podiatry

## 2022-12-08 VITALS — BP 140/82 | HR 108

## 2022-12-08 DIAGNOSIS — M216X9 Other acquired deformities of unspecified foot: Secondary | ICD-10-CM

## 2022-12-08 DIAGNOSIS — L84 Corns and callosities: Secondary | ICD-10-CM | POA: Diagnosis not present

## 2022-12-08 DIAGNOSIS — B351 Tinea unguium: Secondary | ICD-10-CM

## 2022-12-08 DIAGNOSIS — M79676 Pain in unspecified toe(s): Secondary | ICD-10-CM

## 2022-12-08 NOTE — Progress Notes (Signed)
This patient returns to the office for evaluation and treatment of long thick painful nails .  This patient is unable to trim his own nails since the patient cannot reach his feet.  Patient says the nails are painful walking and wearing his shoes.    He returns for preventive foot care services.  General Appearance  Alert, conversant and in no acute stress.  Vascular  Dorsalis pedis and posterior tibial  pulses are palpable  bilaterally.  Capillary return is within normal limits  bilaterally. Temperature is within normal limits  bilaterally.  Neurologic  Senn-Weinstein monofilament wire test within normal limits  bilaterally. Muscle power within normal limits bilaterally.  Nails Thick disfigured discolored nails with subungual debris  from hallux to fifth toes bilaterally. No evidence of bacterial infection or drainage bilaterally.  Orthopedic  No limitations of motion  feet .  No crepitus or effusions noted.  No bony pathology or digital deformities noted.  Prominent fifth metatarsal  B/L. Above ankle swelling  B/L.  Skin  normotropic skin with no porokeratosis noted bilaterally.  No signs of infections or ulcers noted.  Symptomatic callus sub 5th  B/L.  Onychomycosis  Pain in toes right foot  Pain in toes left foot  Callus sub 5th  B/L.  Debridement  of nails  1-5  B/L with a nail nipper.  Nails were then filed using a dremel tool with no incidents.  Debride callus with # 15 blade and dremel tool.Marland Kitchen   RTC  10 weeks     Gardiner Barefoot DPM

## 2023-02-16 ENCOUNTER — Ambulatory Visit (INDEPENDENT_AMBULATORY_CARE_PROVIDER_SITE_OTHER): Payer: 59 | Admitting: Podiatry

## 2023-02-16 ENCOUNTER — Encounter: Payer: Self-pay | Admitting: Podiatry

## 2023-02-16 DIAGNOSIS — M79676 Pain in unspecified toe(s): Secondary | ICD-10-CM | POA: Diagnosis not present

## 2023-02-16 DIAGNOSIS — B351 Tinea unguium: Secondary | ICD-10-CM

## 2023-02-16 DIAGNOSIS — M216X9 Other acquired deformities of unspecified foot: Secondary | ICD-10-CM

## 2023-02-16 NOTE — Progress Notes (Signed)
This patient returns to the office for evaluation and treatment of long thick painful nails .  This patient is unable to trim his own nails since the patient cannot reach his feet.  Patient says the nails are painful walking and wearing his shoes.    He returns for preventive foot care services.  General Appearance  Alert, conversant and in no acute stress.  Vascular  Dorsalis pedis and posterior tibial  pulses are palpable  bilaterally.  Capillary return is within normal limits  bilaterally. Temperature is within normal limits  bilaterally.  Neurologic  Senn-Weinstein monofilament wire test within normal limits  bilaterally. Muscle power within normal limits bilaterally.  Nails Thick disfigured discolored nails with subungual debris  from hallux to fifth toes bilaterally. No evidence of bacterial infection or drainage bilaterally.  Orthopedic  No limitations of motion  feet .  No crepitus or effusions noted.  No bony pathology or digital deformities noted.  Prominent fifth metatarsal  B/L. Above ankle swelling  B/L.  Skin  normotropic skin with no porokeratosis noted bilaterally.  No signs of infections or ulcers noted.    Onychomycosis  Pain in toes right foot  Pain in toes left foot    Debridement  of nails  1-5  B/L with a nail nipper.  Nails were then filed using a dremel tool with no incidents.    RTC  10 weeks     Helane Gunther DPM

## 2023-04-27 ENCOUNTER — Encounter: Payer: Self-pay | Admitting: Podiatry

## 2023-04-27 ENCOUNTER — Ambulatory Visit (INDEPENDENT_AMBULATORY_CARE_PROVIDER_SITE_OTHER): Payer: 59 | Admitting: Podiatry

## 2023-04-27 DIAGNOSIS — L84 Corns and callosities: Secondary | ICD-10-CM

## 2023-04-27 DIAGNOSIS — M216X9 Other acquired deformities of unspecified foot: Secondary | ICD-10-CM | POA: Diagnosis not present

## 2023-04-27 DIAGNOSIS — M79676 Pain in unspecified toe(s): Secondary | ICD-10-CM

## 2023-04-27 DIAGNOSIS — B351 Tinea unguium: Secondary | ICD-10-CM | POA: Diagnosis not present

## 2023-04-27 NOTE — Progress Notes (Signed)
This patient returns to the office for evaluation and treatment of long thick painful nails .  This patient is unable to trim his own nails since the patient cannot reach his feet.  Patient says the nails are painful walking and wearing his shoes.    He returns for preventive foot care services.  General Appearance  Alert, conversant and in no acute stress.  Vascular  Dorsalis pedis and posterior tibial  pulses are palpable  bilaterally.  Capillary return is within normal limits  bilaterally. Temperature is within normal limits  bilaterally.  Neurologic  Senn-Weinstein monofilament wire test within normal limits  bilaterally. Muscle power within normal limits bilaterally.  Nails Thick disfigured discolored nails with subungual debris  from hallux to fifth toes bilaterally. No evidence of bacterial infection or drainage bilaterally.  Orthopedic  No limitations of motion  feet .  No crepitus or effusions noted.  No bony pathology or digital deformities noted.  Prominent fifth metatarsal  B/L. Above ankle swelling  B/L.  Skin  normotropic skin with no porokeratosis noted bilaterally.  No signs of infections or ulcers noted.  Callus sub 5th  B/L.  Onychomycosis  Pain in toes right foot  Pain in toes left foot  Callus  B/L.  Debridement  of nails  1-5  B/L with a nail nipper.  Nails were then filed using a dremel tool with no incidents.    RTC  10 weeks     Helane Gunther DPM

## 2023-04-30 ENCOUNTER — Other Ambulatory Visit: Payer: Self-pay | Admitting: Internal Medicine

## 2023-05-01 LAB — COMPLETE METABOLIC PANEL WITH GFR
AG Ratio: 1.6 (calc) (ref 1.0–2.5)
ALT: 13 U/L (ref 9–46)
AST: 20 U/L (ref 10–35)
Albumin: 4.2 g/dL (ref 3.6–5.1)
Alkaline phosphatase (APISO): 34 U/L — ABNORMAL LOW (ref 35–144)
BUN: 10 mg/dL (ref 7–25)
CO2: 23 mmol/L (ref 20–32)
Calcium: 9.4 mg/dL (ref 8.6–10.3)
Chloride: 107 mmol/L (ref 98–110)
Creat: 0.87 mg/dL (ref 0.70–1.22)
Globulin: 2.7 g/dL (calc) (ref 1.9–3.7)
Glucose, Bld: 74 mg/dL (ref 65–99)
Potassium: 4.3 mmol/L (ref 3.5–5.3)
Sodium: 145 mmol/L (ref 135–146)
Total Bilirubin: 0.4 mg/dL (ref 0.2–1.2)
Total Protein: 6.9 g/dL (ref 6.1–8.1)
eGFR: 87 mL/min/{1.73_m2} (ref >=60)

## 2023-05-01 LAB — TSH: TSH: 1.68 mIU/L (ref 0.40–4.50)

## 2023-05-01 LAB — CBC
HCT: 39.6 % (ref 38.5–50.0)
Hemoglobin: 13 g/dL — ABNORMAL LOW (ref 13.2–17.1)
MCH: 30.7 pg (ref 27.0–33.0)
MCHC: 32.8 g/dL (ref 32.0–36.0)
MCV: 93.6 fL (ref 80.0–100.0)
MPV: 9.9 fL (ref 7.5–12.5)
Platelets: 305 10*3/uL (ref 140–400)
RBC: 4.23 10*6/uL (ref 4.20–5.80)
RDW: 15.8 % — ABNORMAL HIGH (ref 11.0–15.0)
WBC: 3.6 10*3/uL — ABNORMAL LOW (ref 3.8–10.8)

## 2023-05-01 LAB — LIPID PANEL
Cholesterol: 146 mg/dL (ref <200)
HDL: 59 mg/dL (ref >=40)
LDL Cholesterol (Calc): 64 mg/dL (calc)
Non-HDL Cholesterol (Calc): 87 mg/dL (calc) (ref <130)
Total CHOL/HDL Ratio: 2.5 (calc) (ref <5.0)
Triglycerides: 157 mg/dL — ABNORMAL HIGH (ref <150)

## 2023-07-09 ENCOUNTER — Encounter: Payer: Self-pay | Admitting: Podiatry

## 2023-07-09 ENCOUNTER — Ambulatory Visit (INDEPENDENT_AMBULATORY_CARE_PROVIDER_SITE_OTHER): Payer: 59 | Admitting: Podiatry

## 2023-07-09 DIAGNOSIS — M79676 Pain in unspecified toe(s): Secondary | ICD-10-CM

## 2023-07-09 DIAGNOSIS — B351 Tinea unguium: Secondary | ICD-10-CM | POA: Diagnosis not present

## 2023-07-09 DIAGNOSIS — M216X9 Other acquired deformities of unspecified foot: Secondary | ICD-10-CM

## 2023-07-09 DIAGNOSIS — L84 Corns and callosities: Secondary | ICD-10-CM

## 2023-07-09 NOTE — Progress Notes (Signed)
This patient returns to the office for evaluation and treatment of long thick painful nails .  This patient is unable to trim his own nails since the patient cannot reach his feet.  Patient says the nails are painful walking and wearing his shoes.    He returns for preventive foot care services.  General Appearance  Alert, conversant and in no acute stress.  Vascular  Dorsalis pedis and posterior tibial  pulses are palpable  bilaterally.  Capillary return is within normal limits  bilaterally. Temperature is within normal limits  bilaterally.  Neurologic  Senn-Weinstein monofilament wire test within normal limits  bilaterally. Muscle power within normal limits bilaterally.  Nails Thick disfigured discolored nails with subungual debris  from hallux to fifth toes bilaterally. No evidence of bacterial infection or drainage bilaterally.  Orthopedic  No limitations of motion  feet .  No crepitus or effusions noted.  No bony pathology or digital deformities noted.  Prominent fifth metatarsal  B/L. Above ankle swelling  B/L.  Skin  normotropic skin with no porokeratosis noted bilaterally.  No signs of infections or ulcers noted.  Callus sub 5th  B/L.  Onychomycosis  Pain in toes right foot  Pain in toes left foot  Callus  B/L.  Debridement  of nails  1-5  B/L with a nail nipper.  Nails were then filed using a dremel tool with no incidents.    RTC  10 weeks  Addendum.  I came late from my Thursday meeting and he was talking with Morrie Sheldon about me being late.  I walked in and apologized for my lateness and he never responded.  Finally after the service he thanked me.   Helane Gunther DPM

## 2023-09-21 ENCOUNTER — Ambulatory Visit (INDEPENDENT_AMBULATORY_CARE_PROVIDER_SITE_OTHER): Payer: 59 | Admitting: Podiatry

## 2023-09-21 ENCOUNTER — Encounter: Payer: Self-pay | Admitting: Podiatry

## 2023-09-21 DIAGNOSIS — M79676 Pain in unspecified toe(s): Secondary | ICD-10-CM

## 2023-09-21 DIAGNOSIS — B351 Tinea unguium: Secondary | ICD-10-CM

## 2023-09-21 DIAGNOSIS — L84 Corns and callosities: Secondary | ICD-10-CM | POA: Diagnosis not present

## 2023-09-21 DIAGNOSIS — M216X9 Other acquired deformities of unspecified foot: Secondary | ICD-10-CM

## 2023-09-21 NOTE — Progress Notes (Signed)
This patient returns to the office for evaluation and treatment of long thick painful nails .  This patient is unable to trim his own nails since the patient cannot reach his feet.  Patient says the nails are painful walking and wearing his shoes.    He returns for preventive foot care services.  General Appearance  Alert, conversant and in no acute stress.  Vascular  Dorsalis pedis and posterior tibial  pulses are palpable  bilaterally.  Capillary return is within normal limits  bilaterally. Temperature is within normal limits  bilaterally.  Neurologic  Senn-Weinstein monofilament wire test within normal limits  bilaterally. Muscle power within normal limits bilaterally.  Nails Thick disfigured discolored nails with subungual debris  from hallux to fifth toes bilaterally. No evidence of bacterial infection or drainage bilaterally.  Orthopedic  No limitations of motion  feet .  No crepitus or effusions noted.  No bony pathology or digital deformities noted.  Prominent fifth metatarsal  B/L. Above ankle swelling  B/L.  Skin  normotropic skin with no porokeratosis noted bilaterally.  No signs of infections or ulcers noted.  Callus sub 5th  B/L.  Onychomycosis  Pain in toes right foot  Pain in toes left foot  Callus  B/L.  Debridement  of nails  1-5  B/L with a nail nipper.  Nails were then filed using a dremel tool with no incidents.    RTC  10 weeks  Addendum.  I came late from my Thursday meeting and he was talking with Morrie Sheldon about me being late.  I walked in and apologized for my lateness and he never responded.  Finally after the service he thanked me.   Helane Gunther DPM

## 2023-12-07 ENCOUNTER — Ambulatory Visit: Payer: 59 | Admitting: Podiatry

## 2023-12-09 ENCOUNTER — Encounter: Payer: Self-pay | Admitting: Podiatry

## 2023-12-09 ENCOUNTER — Ambulatory Visit (INDEPENDENT_AMBULATORY_CARE_PROVIDER_SITE_OTHER): Payer: 59 | Admitting: Podiatry

## 2023-12-09 DIAGNOSIS — B351 Tinea unguium: Secondary | ICD-10-CM | POA: Diagnosis not present

## 2023-12-09 DIAGNOSIS — M79676 Pain in unspecified toe(s): Secondary | ICD-10-CM | POA: Diagnosis not present

## 2023-12-09 NOTE — Progress Notes (Signed)
 This patient returns to the office for evaluation and treatment of long thick painful nails .  This patient is unable to trim his own nails since the patient cannot reach his feet.  Patient says the nails are painful walking and wearing his shoes.    He returns for preventive foot care services.  General Appearance  Alert, conversant and in no acute stress.  Vascular  Dorsalis pedis and posterior tibial  pulses are palpable  bilaterally.  Capillary return is within normal limits  bilaterally. Temperature is within normal limits  bilaterally.  Neurologic  Senn-Weinstein monofilament wire test within normal limits  bilaterally. Muscle power within normal limits bilaterally.  Nails Thick disfigured discolored nails with subungual debris  from hallux to fifth toes bilaterally. No evidence of bacterial infection or drainage bilaterally.  Orthopedic  No limitations of motion  feet .  No crepitus or effusions noted.  No bony pathology or digital deformities noted.  Prominent fifth metatarsal  B/L. Above ankle swelling  B/L.  Skin  normotropic skin with no porokeratosis noted bilaterally.  No signs of infections or ulcers noted.  Callus sub 5th  B/L asymptomatic.  Onychomycosis  Pain in toes right foot  Pain in toes left foot  Callus  B/L.  Debridement  of nails  1-5  B/L with a nail nipper.  Nails were then filed using a dremel tool with no incidents.    RTC  10 week   [name] DPM

## 2024-02-17 ENCOUNTER — Ambulatory Visit (INDEPENDENT_AMBULATORY_CARE_PROVIDER_SITE_OTHER): Payer: 59 | Admitting: Podiatry

## 2024-02-17 ENCOUNTER — Encounter: Payer: Self-pay | Admitting: Podiatry

## 2024-02-17 DIAGNOSIS — M79676 Pain in unspecified toe(s): Secondary | ICD-10-CM | POA: Diagnosis not present

## 2024-02-17 DIAGNOSIS — B351 Tinea unguium: Secondary | ICD-10-CM

## 2024-02-17 NOTE — Progress Notes (Signed)
 This patient returns to the office for evaluation and treatment of long thick painful nails .  This patient is unable to trim his own nails since the patient cannot reach his feet.  Patient says the nails are painful walking and wearing his shoes.    He returns for preventive foot care services.  General Appearance  Alert, conversant and in no acute stress.  Vascular  Dorsalis pedis and posterior tibial  pulses are palpable  bilaterally.  Capillary return is within normal limits  bilaterally. Temperature is within normal limits  bilaterally.  Neurologic  Senn-Weinstein monofilament wire test within normal limits  bilaterally. Muscle power within normal limits bilaterally.  Nails Thick disfigured discolored nails with subungual debris  from hallux to fifth toes bilaterally. No evidence of bacterial infection or drainage bilaterally.  Orthopedic  No limitations of motion  feet .  No crepitus or effusions noted.  No bony pathology or digital deformities noted.  Prominent fifth metatarsal  B/L.   Skin  normotropic skin with no porokeratosis noted bilaterally.  No signs of infections or ulcers noted.  Callus sub 5th  B/L asymptomatic.  Onychomycosis  Pain in toes right foot  Pain in toes left foot  Callus  B/L.  Debridement  of nails  1-5  B/L with a nail nipper.  Nails were then filed using a dremel tool with no incidents.    RTC  10 week   Ruffin Cotton DPM

## 2024-04-13 ENCOUNTER — Inpatient Hospital Stay (HOSPITAL_COMMUNITY)
Admission: EM | Admit: 2024-04-13 | Discharge: 2024-04-18 | DRG: 871 | Disposition: A | Discharge status: Home / Self Care | Attending: Internal Medicine | Admitting: Internal Medicine

## 2024-04-13 ENCOUNTER — Emergency Department (HOSPITAL_COMMUNITY)

## 2024-04-13 ENCOUNTER — Other Ambulatory Visit: Payer: Self-pay

## 2024-04-13 ENCOUNTER — Encounter (HOSPITAL_COMMUNITY): Payer: Self-pay | Admitting: Emergency Medicine

## 2024-04-13 DIAGNOSIS — I110 Hypertensive heart disease with heart failure: Secondary | ICD-10-CM | POA: Diagnosis present

## 2024-04-13 DIAGNOSIS — E785 Hyperlipidemia, unspecified: Secondary | ICD-10-CM | POA: Diagnosis present

## 2024-04-13 DIAGNOSIS — N400 Enlarged prostate without lower urinary tract symptoms: Secondary | ICD-10-CM | POA: Diagnosis present

## 2024-04-13 DIAGNOSIS — G4700 Insomnia, unspecified: Secondary | ICD-10-CM | POA: Diagnosis present

## 2024-04-13 DIAGNOSIS — S01111A Laceration without foreign body of right eyelid and periocular area, initial encounter: Secondary | ICD-10-CM | POA: Diagnosis present

## 2024-04-13 DIAGNOSIS — J9601 Acute respiratory failure with hypoxia: Secondary | ICD-10-CM | POA: Diagnosis present

## 2024-04-13 DIAGNOSIS — J810 Acute pulmonary edema: Secondary | ICD-10-CM | POA: Diagnosis not present

## 2024-04-13 DIAGNOSIS — Y92009 Unspecified place in unspecified non-institutional (private) residence as the place of occurrence of the external cause: Secondary | ICD-10-CM | POA: Diagnosis not present

## 2024-04-13 DIAGNOSIS — I10 Essential (primary) hypertension: Secondary | ICD-10-CM | POA: Diagnosis present

## 2024-04-13 DIAGNOSIS — R195 Other fecal abnormalities: Secondary | ICD-10-CM | POA: Diagnosis not present

## 2024-04-13 DIAGNOSIS — I82452 Acute embolism and thrombosis of left peroneal vein: Secondary | ICD-10-CM | POA: Diagnosis present

## 2024-04-13 DIAGNOSIS — Z79899 Other long term (current) drug therapy: Secondary | ICD-10-CM

## 2024-04-13 DIAGNOSIS — I2609 Other pulmonary embolism with acute cor pulmonale: Secondary | ICD-10-CM | POA: Diagnosis present

## 2024-04-13 DIAGNOSIS — W19XXXA Unspecified fall, initial encounter: Secondary | ICD-10-CM

## 2024-04-13 DIAGNOSIS — K219 Gastro-esophageal reflux disease without esophagitis: Secondary | ICD-10-CM | POA: Diagnosis present

## 2024-04-13 DIAGNOSIS — Z860101 Personal history of adenomatous and serrated colon polyps: Secondary | ICD-10-CM | POA: Diagnosis not present

## 2024-04-13 DIAGNOSIS — Z9842 Cataract extraction status, left eye: Secondary | ICD-10-CM

## 2024-04-13 DIAGNOSIS — E871 Hypo-osmolality and hyponatremia: Secondary | ICD-10-CM | POA: Diagnosis present

## 2024-04-13 DIAGNOSIS — K760 Fatty (change of) liver, not elsewhere classified: Secondary | ICD-10-CM | POA: Diagnosis present

## 2024-04-13 DIAGNOSIS — R7989 Other specified abnormal findings of blood chemistry: Secondary | ICD-10-CM | POA: Diagnosis not present

## 2024-04-13 DIAGNOSIS — R55 Syncope and collapse: Secondary | ICD-10-CM | POA: Diagnosis present

## 2024-04-13 DIAGNOSIS — I5032 Chronic diastolic (congestive) heart failure: Secondary | ICD-10-CM | POA: Diagnosis present

## 2024-04-13 DIAGNOSIS — W010XXA Fall on same level from slipping, tripping and stumbling without subsequent striking against object, initial encounter: Secondary | ICD-10-CM | POA: Diagnosis present

## 2024-04-13 DIAGNOSIS — R531 Weakness: Secondary | ICD-10-CM | POA: Diagnosis not present

## 2024-04-13 DIAGNOSIS — Z800 Family history of malignant neoplasm of digestive organs: Secondary | ICD-10-CM

## 2024-04-13 DIAGNOSIS — N179 Acute kidney failure, unspecified: Principal | ICD-10-CM | POA: Diagnosis present

## 2024-04-13 DIAGNOSIS — K922 Gastrointestinal hemorrhage, unspecified: Secondary | ICD-10-CM | POA: Diagnosis not present

## 2024-04-13 DIAGNOSIS — Z833 Family history of diabetes mellitus: Secondary | ICD-10-CM

## 2024-04-13 DIAGNOSIS — D62 Acute posthemorrhagic anemia: Secondary | ICD-10-CM | POA: Diagnosis not present

## 2024-04-13 DIAGNOSIS — G8929 Other chronic pain: Secondary | ICD-10-CM | POA: Diagnosis present

## 2024-04-13 DIAGNOSIS — Z87891 Personal history of nicotine dependence: Secondary | ICD-10-CM

## 2024-04-13 DIAGNOSIS — R079 Chest pain, unspecified: Secondary | ICD-10-CM | POA: Diagnosis not present

## 2024-04-13 DIAGNOSIS — I3139 Other pericardial effusion (noninflammatory): Secondary | ICD-10-CM | POA: Diagnosis present

## 2024-04-13 DIAGNOSIS — J470 Bronchiectasis with acute lower respiratory infection: Secondary | ICD-10-CM | POA: Diagnosis present

## 2024-04-13 DIAGNOSIS — R262 Difficulty in walking, not elsewhere classified: Secondary | ICD-10-CM | POA: Diagnosis present

## 2024-04-13 DIAGNOSIS — I50810 Right heart failure, unspecified: Secondary | ICD-10-CM

## 2024-04-13 DIAGNOSIS — A419 Sepsis, unspecified organism: Principal | ICD-10-CM | POA: Diagnosis present

## 2024-04-13 DIAGNOSIS — Z86711 Personal history of pulmonary embolism: Secondary | ICD-10-CM | POA: Diagnosis not present

## 2024-04-13 DIAGNOSIS — J189 Pneumonia, unspecified organism: Secondary | ICD-10-CM | POA: Diagnosis present

## 2024-04-13 DIAGNOSIS — E861 Hypovolemia: Secondary | ICD-10-CM | POA: Diagnosis present

## 2024-04-13 DIAGNOSIS — E872 Acidosis, unspecified: Secondary | ICD-10-CM | POA: Diagnosis present

## 2024-04-13 DIAGNOSIS — Z888 Allergy status to other drugs, medicaments and biological substances status: Secondary | ICD-10-CM

## 2024-04-13 DIAGNOSIS — R0789 Other chest pain: Secondary | ICD-10-CM | POA: Diagnosis present

## 2024-04-13 LAB — CBC
HCT: 34.5 % — ABNORMAL LOW (ref 39.0–52.0)
Hemoglobin: 11.9 g/dL — ABNORMAL LOW (ref 13.0–17.0)
MCH: 30.5 pg (ref 26.0–34.0)
MCHC: 34.5 g/dL (ref 30.0–36.0)
MCV: 88.5 fL (ref 80.0–100.0)
Platelets: 202 10*3/uL (ref 150–400)
RBC: 3.9 MIL/uL — ABNORMAL LOW (ref 4.22–5.81)
RDW: 14.2 % (ref 11.5–15.5)
WBC: 9.3 10*3/uL (ref 4.0–10.5)
nRBC: 0.6 % — ABNORMAL HIGH (ref 0.0–0.2)

## 2024-04-13 LAB — BASIC METABOLIC PANEL WITH GFR
Anion gap: 9 (ref 5–15)
BUN: 21 mg/dL (ref 8–23)
CO2: 19 mmol/L — ABNORMAL LOW (ref 22–32)
Calcium: 9.2 mg/dL (ref 8.9–10.3)
Chloride: 100 mmol/L (ref 98–111)
Creatinine, Ser: 1.62 mg/dL — ABNORMAL HIGH (ref 0.61–1.24)
GFR, Estimated: 42 mL/min — ABNORMAL LOW (ref >60)
Glucose, Bld: 103 mg/dL — ABNORMAL HIGH (ref 70–99)
Potassium: 5.1 mmol/L (ref 3.5–5.1)
Sodium: 128 mmol/L — ABNORMAL LOW (ref 135–145)

## 2024-04-13 LAB — URINALYSIS, ROUTINE W REFLEX MICROSCOPIC
Bilirubin Urine: NEGATIVE
Glucose, UA: NEGATIVE mg/dL
Hgb urine dipstick: NEGATIVE
Ketones, ur: 5 mg/dL — AB
Leukocytes,Ua: NEGATIVE
Nitrite: NEGATIVE
Protein, ur: NEGATIVE mg/dL
Specific Gravity, Urine: 1.014 (ref 1.005–1.030)
pH: 5 (ref 5.0–8.0)

## 2024-04-13 LAB — TROPONIN I (HIGH SENSITIVITY)
Troponin I (High Sensitivity): 82 ng/L — ABNORMAL HIGH (ref <18)
Troponin I (High Sensitivity): 80 ng/L — ABNORMAL HIGH (ref <18)

## 2024-04-13 MED ORDER — ASPIRIN 325 MG PO TABS
325.0000 mg | ORAL_TABLET | Freq: Every day | ORAL | Status: DC
  Administered 2024-04-13 – 2024-04-15 (×3): 325 mg via ORAL
  Filled 2024-04-13 (×3): qty 1

## 2024-04-13 MED ORDER — SODIUM CHLORIDE 0.9 % IV SOLN
100.0000 mg | Freq: Two times a day (BID) | INTRAVENOUS | Status: DC
  Administered 2024-04-14: 100 mg via INTRAVENOUS
  Filled 2024-04-13: qty 100

## 2024-04-13 MED ORDER — DOXYCYCLINE HYCLATE 100 MG PO TABS
100.0000 mg | ORAL_TABLET | Freq: Once | ORAL | Status: AC
  Administered 2024-04-13: 100 mg via ORAL
  Filled 2024-04-13: qty 1

## 2024-04-13 MED ORDER — ALBUTEROL SULFATE (2.5 MG/3ML) 0.083% IN NEBU: 2.5000 mg | INHALATION_SOLUTION | RESPIRATORY_TRACT | Status: DC | PRN

## 2024-04-13 MED ORDER — BENZONATATE 100 MG PO CAPS
200.0000 mg | ORAL_CAPSULE | Freq: Three times a day (TID) | ORAL | Status: DC | PRN
  Administered 2024-04-16 – 2024-04-17 (×2): 200 mg via ORAL
  Filled 2024-04-13 (×2): qty 2

## 2024-04-13 MED ORDER — SODIUM CHLORIDE 0.9 % IV SOLN
1.0000 g | INTRAVENOUS | Status: DC
  Administered 2024-04-14 – 2024-04-17 (×4): 1 g via INTRAVENOUS
  Filled 2024-04-13 (×4): qty 10

## 2024-04-13 MED ORDER — LACTATED RINGERS IV SOLN: INTRAVENOUS | Status: AC

## 2024-04-13 MED ORDER — SODIUM CHLORIDE 0.9 % IV SOLN
1.0000 g | Freq: Once | INTRAVENOUS | Status: AC
  Administered 2024-04-14: 1 g via INTRAVENOUS
  Filled 2024-04-13: qty 10

## 2024-04-13 MED ORDER — GUAIFENESIN ER 600 MG PO TB12
600.0000 mg | ORAL_TABLET | Freq: Two times a day (BID) | ORAL | Status: DC
  Administered 2024-04-13 – 2024-04-18 (×10): 600 mg via ORAL
  Filled 2024-04-13 (×10): qty 1

## 2024-04-13 MED ORDER — ACETAMINOPHEN 650 MG RE SUPP: 650.0000 mg | Freq: Four times a day (QID) | RECTAL | Status: DC | PRN

## 2024-04-13 MED ORDER — LACTATED RINGERS IV BOLUS
1000.0000 mL | Freq: Once | INTRAVENOUS | Status: AC
  Administered 2024-04-14: 1000 mL via INTRAVENOUS

## 2024-04-13 MED ORDER — SODIUM CHLORIDE 0.9% FLUSH: 10.0000 mL | INTRAVENOUS | Status: DC | PRN

## 2024-04-13 MED ORDER — ACETAMINOPHEN 325 MG PO TABS
650.0000 mg | ORAL_TABLET | Freq: Four times a day (QID) | ORAL | Status: DC | PRN
  Administered 2024-04-14 – 2024-04-17 (×2): 650 mg via ORAL
  Filled 2024-04-13 (×4): qty 2

## 2024-04-13 MED ORDER — MELATONIN 3 MG PO TABS
3.0000 mg | ORAL_TABLET | Freq: Every evening | ORAL | Status: DC | PRN
  Administered 2024-04-16 – 2024-04-17 (×2): 3 mg via ORAL
  Filled 2024-04-13 (×2): qty 1

## 2024-04-13 NOTE — H&P (Signed)
 History and Physical      Crewe Heathman ZDG:[id number] DOB: 1940/12/24 DOA: 04/13/2024; DOS: 04/13/2024  PCP: Shannan Dart., FNP  Patient coming from: home   I have personally briefly reviewed patient's old medical records in [facility]  Chief Complaint: sob  HPI: Elven Laboy is a 83 y.o. male with medical history significant for chronic diastolic heart failure, essential pretension, GERD, who is admitted to [facility] on 04/13/2024 with community-acquired pneumonia after presenting from home to [facility] ED complaining of shortness of breath.   The patient reports 4 days of shortness of breath associated with new productive cough and sharp, right-sided chest pain, without radiation.  He also notes associated subjective fever, chills over the above timeframe, in the absence of any associated full body rigors or generalized myalgias.  Shortness of breath is not associated with any palpitations, diaphoresis, dizziness.  He notes some intermittent nausea in the absence of vomiting.  Shortness of breath is not associate with any orthopnea, PND, or worsening edema.  No recent hemoptysis.  Intermittent right-sided chest discomfort, has been nonexertional and nonpositional, worsens with cough.  No recent calf tenderness or new lower extremity erythema.  Over the last 4 days, patient also notes generalized weakness in the absence of any acute focal weakness.  In the context of this generalized weakness, he reports a single fall 2 to 3 days ago, in which he tripped while attempting to ambulate to the bathroom.  He notes that he did hit his head as a component of this fall, but denies any associated loss of consciousness.  No ensuing headache or neck discomfort.  Denies any acute arthralgias or myalgias as a result of this fall.  No recent dysuria or gross hematuria.    ED Course:  Vital signs in the ED were notable for the following: Afebrile; heart rates in the 80s to 90s; systolic  blood pressures in the low 100s to 1 teens; respiratory rate 14-23, oxygen saturation 97 to 100% on room air.  Labs were notable for the following: BMP was notable for the following: Sodium 128 compared to most recent prior value of 145 in June 2024, potassium of 5.1, creatinine 1.62 compared to 0.87 in June 2024.  High sensitive troponin I was initially 82, 3B value trending down to 80.  This is relative to most recent prior high sensitive troponin I value of 3 in December 2022.  CBC notable for open cell count 9300.  Urinalysis showed no red blood cells and was nitrate/leukocyte esterase negative.  Per my interpretation, EKG in ED demonstrated the following: Sinus rhythm with heart rate 92, normal intervals, nonspecific T wave flattening in leads III, aVF, nonspecific T wave inversion in lead V3, and no evidence of ST changes, including no evidence of ST elevation.  Imaging in the ED, per corresponding formal radiology read, was notable for the following: 2 view chest x-ray showed evidence of right middle lobe and right lower lobe airspace opacity concerning for pneumonia, in the absence any evidence of edema, effusion, or pneumothorax.  Noncontrast CT head showed no evidence of acute intracranial process, including no evidence of intracranial hemorrhage or any evidence of acute infarct, while CT cervical spine showed notes of acute cervical spine fracture or subluxation injury.  EDP d/w on-call cardiology, who felt that the patient's pleuritic chest discomfort and mild elevated troponin is consistent with supply/demand mismatch in the setting of his presenting pneumonia. Cardiology is available, as needed, if additional consultation is  needed.   While in the ED, the following were administered: Doxycycline , Rocephin , lactated Ringer 's x 1 L bolus.  Subsequently, the patient was admitted for further evaluation management of presenting community-acquired pneumonia, with presentation complicated by acute  kidney injury, mildly elevated troponin, acute hyponatremia as well as generalized weakness.    Review of Systems: As per HPI otherwise 10 point review of systems negative.   Past Medical History:  Diagnosis Date   Adenomatous colon polyp 01/2009   Anemia    BPH (benign prostatic hypertrophy)    Cataract    Chronic headaches    Colon polyps    Fatty liver    GERD (gastroesophageal reflux disease)    Heartburn    Hepatic hemangioma    Hiatal hernia    Hypertension    Internal hemorrhoids    Leukopenia    Pulmonary embolism (HCC)     Past Surgical History:  Procedure Laterality Date   CATARACT EXTRACTION     Left eye   COLONOSCOPY      Social History:  reports that he quit smoking about 43 years ago. His smoking use included cigarettes. He started smoking about 78 years ago. He has a 8.8 pack-year smoking history. He has never used smokeless tobacco. He reports current alcohol use. He reports that he does not use drugs.   Allergies  Allergen Reactions   Gadolinium Derivatives Other (See Comments)    unknown   Prilosec [Omeprazole ] Itching    Family History  Problem Relation Age of Onset   Stomach cancer Mother    Diabetes Father    Diabetes Sister    Diabetes Brother    Colon cancer Neg Hx    Colon polyps Neg Hx    Esophageal cancer Neg Hx    Pancreatic cancer Neg Hx     Family history reviewed and not pertinent    Prior to Admission medications   Medication Sig Start Date End Date Taking? Authorizing Provider  dorzolamide -timolol  (COSOPT ) 22.3-6.8 MG/ML ophthalmic solution Place 1 drop into both eyes 2 (two) times daily. 11/28/19   [provider]  losartan  (COZAAR ) 50 MG tablet TAKE 1 TABLET (50 MG TOTAL) BY MOUTH DAILY. Patient taking differently: Take 50 mg by mouth daily. 11/08/20 12/10/21  Bary Boss, DO  pantoprazole  (PROTONIX ) 40 MG tablet Take 40 mg by mouth daily. 03/09/20   [provider]  traZODone  (DESYREL ) 100 MG tablet Take  100 mg by mouth at bedtime.    [provider]     Objective    Physical Exam: Vitals:   04/13/24 2122 04/13/24 2130 04/13/24 2145 04/13/24 2200  BP: 105/80 106/80 103/81 101/80  Pulse: 87  90 88  Resp: (!) 29 (!) 23 18 17   Temp: (!) 97.3 F (36.3 C)     TempSrc: Axillary     SpO2: 98%  97% 99%    General: appears to be stated age; alert, oriented Skin: warm, dry, no rash Head:  AT/[place] Mouth:  Oral mucosa membranes appear moist, normal dentition Neck: supple; trachea midline Heart:  RRR; did not appreciate any M/R/G Lungs: CTAB, did not appreciate any wheezes, rales, or rhonchi Abdomen: + BS; soft, ND, NT Vascular: 2+ pedal pulses b/l; 2+ radial pulses b/l Extremities: no peripheral edema, no muscle wasting Neuro: strength and sensation intact in upper and lower extremities b/l     Labs on Admission: I have personally reviewed following labs and imaging studies  CBC: Recent Labs  Lab 04/13/24 1734  WBC 9.3  HGB 11.9*  HCT 34.5*  MCV 88.5  PLT 202   Basic Metabolic Panel: Recent Labs  Lab 04/13/24 1734  NA 128*  K 5.1  CL 100  CO2 19*  GLUCOSE 103*  BUN 21  CREATININE 1.62*  CALCIUM  9.2   GFR: CrCl cannot be calculated (Unknown ideal weight.). Liver Function Tests: No results for input(s): AST, ALT, ALKPHOS, BILITOT, PROT, ALBUMIN in the last 168 hours. No results for input(s): LIPASE, AMYLASE in the last 168 hours. No results for input(s): AMMONIA in the last 168 hours. Coagulation Profile: No results for input(s): INR, PROTIME in the last 168 hours. Cardiac Enzymes: No results for input(s): CKTOTAL, CKMB, CKMBINDEX, TROPONINI in the last 168 hours. BNP (last 3 results) No results for input(s): PROBNP in the last 8760 hours. HbA1C: No results for input(s): HGBA1C in the last 72 hours. CBG: No results for input(s): GLUCAP in the last 168 hours. Lipid Profile: No results for input(s): CHOL, HDL,  LDLCALC, TRIG, CHOLHDL, LDLDIRECT in the last 72 hours. Thyroid  Function Tests: No results for input(s): TSH, T4TOTAL, FREET4, T3FREE, THYROIDAB in the last 72 hours. Anemia Panel: No results for input(s): VITAMINB12, FOLATE, FERRITIN, TIBC, IRON, RETICCTPCT in the last 72 hours. Urine analysis:    Component Value Date/Time   COLORURINE YELLOW 04/13/2024 1845   APPEARANCEUR CLEAR 04/13/2024 1845   LABSPEC 1.014 04/13/2024 1845   PHURINE 5.0 04/13/2024 1845   GLUCOSEU NEGATIVE 04/13/2024 1845   GLUCOSEU NEGATIVE 12/20/2015 1047   HGBUR NEGATIVE 04/13/2024 1845   BILIRUBINUR NEGATIVE 04/13/2024 1845   BILIRUBINUR NEG 03/25/2013 1442   KETONESUR 5 (A) 04/13/2024 1845   PROTEINUR NEGATIVE 04/13/2024 1845   UROBILINOGEN 0.2 12/20/2015 1047   NITRITE NEGATIVE 04/13/2024 1845   LEUKOCYTESUR NEGATIVE 04/13/2024 1845    Radiological Exams on Admission: CT Head Wo Contrast Result Date: 04/13/2024 CLINICAL DATA:  Multiple falls with headaches and neck pain, initial encounter EXAM: CT HEAD WITHOUT CONTRAST CT CERVICAL SPINE WITHOUT CONTRAST TECHNIQUE: Multidetector CT imaging of the head and cervical spine was performed following the standard protocol without intravenous contrast. Multiplanar CT image reconstructions of the cervical spine were also generated. RADIATION DOSE REDUCTION: This exam was performed according to the departmental dose-optimization program which includes automated exposure control, adjustment of the mA and/or kV according to patient size and/or use of iterative reconstruction technique. COMPARISON:  None Available. FINDINGS: CT HEAD FINDINGS Brain: No evidence of acute infarction, hemorrhage, hydrocephalus, extra-axial collection or mass lesion/mass effect. Chronic atrophic changes are noted. Vascular: No hyperdense vessel or unexpected calcification. Skull: Normal. Negative for fracture or focal lesion. Sinuses/Orbits: No acute finding. Other:  None. CT CERVICAL SPINE FINDINGS Alignment: Loss of the normal cervical lordosis is noted. Skull base and vertebrae: 7 cervical segments are well visualized. Vertebral body height is well maintained. Multilevel disc space narrowing and osteophytic changes are seen. Facet hypertrophic changes are noted as well. The odontoid is within normal limits. Soft tissues and spinal canal: Surrounding soft tissue structures are within normal limits. Upper chest: Visualized lung apices are unremarkable. Other: None IMPRESSION: CT of the head: Chronic atrophic changes. CT of the cervical spine: Degenerative change without acute abnormality. Electronically Signed   By: Violeta Grey M.D.   On: 04/13/2024 19:37   CT Cervical Spine Wo Contrast Result Date: 04/13/2024 CLINICAL DATA:  Multiple falls with headaches and neck pain, initial encounter EXAM: CT HEAD WITHOUT CONTRAST CT CERVICAL SPINE WITHOUT CONTRAST TECHNIQUE: Multidetector CT imaging of the head  and cervical spine was performed following the standard protocol without intravenous contrast. Multiplanar CT image reconstructions of the cervical spine were also generated. RADIATION DOSE REDUCTION: This exam was performed according to the departmental dose-optimization program which includes automated exposure control, adjustment of the mA and/or kV according to patient size and/or use of iterative reconstruction technique. COMPARISON:  None Available. FINDINGS: CT HEAD FINDINGS Brain: No evidence of acute infarction, hemorrhage, hydrocephalus, extra-axial collection or mass lesion/mass effect. Chronic atrophic changes are noted. Vascular: No hyperdense vessel or unexpected calcification. Skull: Normal. Negative for fracture or focal lesion. Sinuses/Orbits: No acute finding. Other: None. CT CERVICAL SPINE FINDINGS Alignment: Loss of the normal cervical lordosis is noted. Skull base and vertebrae: 7 cervical segments are well visualized. Vertebral body height is well  maintained. Multilevel disc space narrowing and osteophytic changes are seen. Facet hypertrophic changes are noted as well. The odontoid is within normal limits. Soft tissues and spinal canal: Surrounding soft tissue structures are within normal limits. Upper chest: Visualized lung apices are unremarkable. Other: None IMPRESSION: CT of the head: Chronic atrophic changes. CT of the cervical spine: Degenerative change without acute abnormality. Electronically Signed   By: Violeta Grey M.D.   On: 04/13/2024 19:37   DG Chest 2 View Result Date: 04/13/2024 CLINICAL DATA:  Chest pain short of breath EXAM: CHEST - 2 VIEW COMPARISON:  08/30/2021 FINDINGS: Emphysema. Patchy right middle and lower lobe opacity. No pleural effusion. Normal cardiac size. No pneumothorax IMPRESSION: Patchy right middle and lower lobe opacity suspicious for pneumonia. Radiographic follow-up to resolution is recommended. Electronically Signed   By: Esmeralda Hedge M.D.   On: 04/13/2024 18:37      Assessment/Plan   Principal Problem:   CAP (community acquired pneumonia) Active Problems:   Essential hypertension   GERD (gastroesophageal reflux disease)   Atypical chest pain   AKI (acute kidney injury) (HCC)   Elevated troponin   Fall at home, initial encounter   Acute hyponatremia   Generalized weakness   Chronic diastolic CHF (congestive heart failure) (HCC)     #) Community-acquired pneumonia: dx on the basis of 4 days of shortness of breath, productive cough, subjective fever, chills,, with presenting chest x-ray showing evidence of right middle lobe and right lower lobe airspace opacities concerning for pneumonia.  Of note, in the absence of objective fever or leukocytosis, SIRS criteria are not met for sepsis at this time.  Appears hemodynamically stable, without e/o hypotension.     In the ED today, the patient was started on doxycycline  and Rocephin , which will be continued as CAP coverage.  No e/o additional  underlying infxn at this time, including urinalysis that was inconsistent with UTI.   Plan: LR at 50 cc/h x 10 hours. Continue doxycycline , Rocephin . Check Procalcitonin level. As needed acetaminophen  for fever.  Repeat CMP, CBC in the morning.  Check serum magnesium  and phosphorus levels.  Prn Tessalon Perles for cough.  Scheduled guaifenesin .  Incentive spirometry, flutter valve.  Check strep pneumonia urine antigen.  Prn albuterol nebulizer.  BNP.  Check sputum culture.                   #) Acute Kidney Injury:   Presenting creatinine 1.62 compared to 0.87 when checked in June 2024.  Suspect prerenal contribution in the context of presenting acute infection in the form of community-acquired pneumonia, concomitant with recent decline in oral intake.  Potential pharmacologic exacerbation in the setting of outpatient losartan .  Presenting urinalysis shows  no evidence of significant urinary casts, aside from mild elevation in ketones, which appears consistent with starvation ketosis given patient's report of recent decline in oral intake.  Plan: monitor strict I's & O's and daily weights. Attempt to avoid nephrotoxic agents.  Hold home losartan  as well as gabapentin.  Refrain from NSAIDs. Repeat CMP in the morning. Check serum magnesium  level. Add-on random urine sodium and random urine creatinine.  Activators at 50 cc/h x 2 now.  Hold home losartan .  Further evaluation management of presenting community-acquired pneumonia, as above. if renal function does not improve with the above measures, can consider obtaining a renal US  to evaluate for parenchymal abnormality as well as to assess for evidence of post-renal obstructive process, noting that he does have a history of BPH.                  #) atypical chest pain: Sharp right-sided pleuritic chest discomfort, which appears consistent in nature and distribution with multilobular right-sided pneumonia as observed on today's  chest x-ray.  Appears atypical for ACS, was noted to be nonexertional in nature.  Clinically, acute pulmonary embolism appears less likely.  EDP discussed patient's case with on-call cardiology, who also felt that presentation was less likely to represent ACS, as further detailed above.  Plan: Further evaluation management of presenting community-acquired pneumonia, as above.  Prn acetaminophen  for right-sided chest discomfort.  Follow-up for result of echocardiogram ordered for the morning.  Incentive spirometry .                  #) Elevated troponin: mildly elevated initial troponin of 82, with repeat trending down to 80, which is relative to most recent prior high-sensitivity troponin I value of 3 when checked in December 2022. Suspect that this mildly elevated troponin is on the basis of supply demand mismatch in the setting of presenting acute infection in the form of community-acquired pneumonia as opposed to representing a type I process due to acute plaque rupture. Additionally, relative decline in renal clearance of troponin in the setting of AKI is likely also contributing to presenting troponin elevation. EKG shows no evidence of acute ischemic changes, including no evidence of STEMI, and CXR showed right middle lobe and right lower lobe airspace opacities consistent with pneumonia, but otherwise no acute CP process, including no evidence of pneumothorax.  Overall, ACS is felt to be less likely relative to type 2 supply demand mismatch, as above.  EDP discussed with on-call cardiology, who also felt that presentation, including mildly elevated troponin was more likely due to the basis of type II supply/demand mismatch in the setting of acute infection, as above.  Presentation clinically less suggestive of acute PE .  Of note, patient did receive a full dose aspirin in the ED this evening.  Plan:  Monitor on telemetry.Check serum Mg level and repeat BMP in the morning.  Repeat CBC in  the AM. Additional evaluation and management of presenting community-acquired pneumonia as suspected driving force behind mildly elevated troponin, as above.  Echocardiogram ordered for the morning.                   #) Acute hypo-osmolar  hyponatremia: Presenting serum sodium of 128 compared to most recent prior value of 145 in June 2024. Suspect an element of hypovolumeia, with suspected contribution from dehydration in the setting of clinical evidence of such as well as report of recent decline in oral intake.  Differential also includes the possibility of  a contribution from SIADH, particularly given the presenting community-acquired pneumonia. in general, will provide gentle IV fluids to attend to suspected contribution from dehydration, while further evaluating for any additional contributing factors, including SIADH, as further detailed below. Will also check TSH in the context of her report of generalized weakness. No overt pharmacologic contributions. Of note, no evidence of associated acute focal neurologic deficits.  In the setting of the patient's recent fall in which he did strike his head, differential includes cerebral salt wasting syndrome, although this appears less likely relative to the above differential.  Plan: monitor strict I's and O's and daily weights.  check random urine sodium, urine osmolality.  Check serum osmolality to confirm suspected hypoosmolar etiology.  Repeat CMP in the morning. Check TSH. Gentle IVF's in the form of lactated Ringer 's developed 50 cc/h x 10 hours.  Check BNP.  Further evaluation management of presenting community-acquired pneumonia, as above.   Aaron Aas                    #) Generalized weakness resulting in ground-level mechanical fall: 4-day duration of generalized weakness, in the absence of any evidence of acute focal neurologic deficits, including no evidence of acute focal weakness to suggest acute CVA, We will noting  that CT head showed no evidence of acute intracranial process.  Suspect contribution from physiologic stress stemming from presenting community-acquired pneumonia.  No e/o additional infectious process at this time, including urinalysis that is inconsistent with UTI.  Resultant ground-level mechanical fall, did result in the patient hitting his head, while CT head shows no evidence of acute intracranial process and CT cervical spine shows no evidence of acute fracture or subluxation injury.  No evidence of additional traumatic injury at this time.  Potential additional contribution from presenting acute hyponatremia, as above.  Will further eval for any additional contributions from endocrine/metabolic sources, as detailed below.   Plan: work-up and management of presenting community-acquired pneumonia as well as acute hyponatremia, as described above. PT/OT consults ordered for the AM. Fall precautions. CMP/CBC in the AM. Check TSH, serum Mg level.                       #) Chronic diastolic heart failure: documented history of such, with most recent echocardiogram performed in January 2022, which is notable for LVEF 60 to 65%, as well as grade 1 diastolic dysfunction, normal right ventricular systolic function, no evidence of significant valvular pathology.. No clinical or radiographic evidence to suggest acutely decompensated heart failure at this time. home diuretic regimen reportedly consists of the following: None.   Plan: monitor strict I's & O's and daily weights. Repeat CMP in AM. Check serum mag level.  Check BNP.                    #) Essential Hypertension: documented h/o such, with outpatient antihypertensive regimen including losartan , Norvasc .  SBP's in the ED today: Low 100s to 1 teens mmHg. in the setting of presenting AKI, will hold home losartan .  Additionally, in the setting of presenting infectious process, will hold home Norvasc  as well.  Plan:  Close monitoring of subsequent BP via routine VS. hold home antihypertensive medications for now, as above.  Monitor strict I's and O's and daily weights.                      #) GERD: documented h/o such; on Protonix  as outpatient.  Plan: continue home PPI.        DVT prophylaxis: SCD's   Code Status: Full code Family Communication: none Disposition Plan: Per Rounding Team Consults called: EDP d/w on-call cardiology, who felt that the patient's pleuritic chest discomfort and mild elevated troponin is consistent with supply/demand mismatch in the setting of his presenting pneumonia. Cardiology is available, as needed, if additional consultation is needed. ;  Admission status: Inpatient     I SPENT GREATER THAN 75  MINUTES IN CLINICAL CARE TIME/MEDICAL DECISION-MAKING IN COMPLETING THIS ADMISSION.      Gattis Kass Vernard Gram DO [facility]  From 7PM - 7AM   04/13/2024, 10:52 PM

## 2024-04-13 NOTE — ED Provider Triage Note (Signed)
 Emergency Medicine Provider Triage Evaluation Note  Kenneth Roberts , a 83 y.o. male  was evaluated in triage.  Pt complains of chest pain and falls.  States he fell 3 times on Saturday and may have passed out leading up to it.  Also reports chest pain since Saturday as well.  Endorses associated shortness of breath.  He is not on a blood thinner.  Review of Systems  Positive: See above Negative: See above  Physical Exam  BP (!) 113/91   Pulse 94   Temp 98.2 F (36.8 C) (Oral)   Resp 16   SpO2 98%  Gen:   Awake, no distress   Resp:  Normal effort  MSK:   Moves extremities without difficulty  Other:    Medical Decision Making  Medically screening exam initiated at 6:52 PM.  Appropriate orders placed.  Kenneth Roberts was informed that the remainder of the evaluation will be completed by another provider, this initial triage assessment does not replace that evaluation, and the importance of remaining in the ED until their evaluation is complete.  Work up started   Janalee Mcmurray, PA-C 04/13/24 1853

## 2024-04-13 NOTE — ED Provider Notes (Signed)
 [place] [facility] Provider Note   CSN: [id number] Arrival date & time: 04/13/24  1729     History  Chief Complaint  Patient presents with   Chest Pain    Kenneth Roberts is a 83 y.o. male with a past medical history of GERD, HTN, BPH, HLD, PE, prolonged QT, chronic abdominal pain presents to emergency department for evaluation of shortness of breath, productive cough, generalized weakness, chest tightness, injury following a fall 4 days ago.  Reports that he was feeling generally weak and was attempting to go to the bathroom when he had an unwitnessed fall and believes he hit his head.  Denies any prodrome.  No history of seizure.  Also complains of 9/10 chest tightness that has been constant since Saturday. Nonexertional. Associated SHOB and nausea. Worsens with deep breath, palpation, and coughing  Last echo 2022 LVEF 60-65%   Chest Pain      Home Medications Prior to Admission medications   Medication Sig Start Date End Date Taking? Authorizing Provider  dorzolamide -timolol  (COSOPT ) 22.3-6.8 MG/ML ophthalmic solution Place 1 drop into both eyes 2 (two) times daily. 11/28/19   [provider]  losartan  (COZAAR ) 50 MG tablet TAKE 1 TABLET (50 MG TOTAL) BY MOUTH DAILY. Patient taking differently: Take 50 mg by mouth daily. 11/08/20 12/10/21  Bary Boss, DO  pantoprazole  (PROTONIX ) 40 MG tablet Take 40 mg by mouth daily. 03/09/20   [provider]  traZODone  (DESYREL ) 100 MG tablet Take 100 mg by mouth at bedtime.    [provider]      Allergies    Gadolinium derivatives and Prilosec [omeprazole ]    Review of Systems   Review of Systems  Cardiovascular:  Positive for chest pain.    Physical Exam Updated Vital Signs BP 106/80   Pulse 87   Temp (!) 97.3 F (36.3 C) (Axillary)   Resp (!) 23   SpO2 98%  Physical Exam Vitals and nursing note reviewed.  Constitutional:      General: He is not in acute  distress.    Appearance: Normal appearance.  HENT:     Head: Normocephalic and atraumatic.  Eyes:     Conjunctiva/sclera: Conjunctivae normal.  Cardiovascular:     Rate and Rhythm: Normal rate.  Pulmonary:     Effort: Pulmonary effort is normal. No respiratory distress.     Breath sounds: Examination of the right-middle field reveals decreased breath sounds. Examination of the right-lower field reveals decreased breath sounds. Decreased breath sounds present.     Comments: Mild tachypnea.  Speaking in full and complete sentences Chest:     Chest wall: Tenderness present.  Skin:    Coloration: Skin is not jaundiced or pale.  Neurological:     Mental Status: He is alert. Mental status is at baseline.     ED Results / Procedures / Treatments   Labs (all labs ordered are listed, but only abnormal results are displayed) Labs Reviewed  BASIC METABOLIC PANEL WITH GFR - Abnormal; Notable for the following components:      Result Value   Sodium 128 (*)    CO2 19 (*)    Glucose, Bld 103 (*)    Creatinine, Ser 1.62 (*)    GFR, Estimated 42 (*)    All other components within normal limits  CBC - Abnormal; Notable for the following components:   RBC 3.90 (*)    Hemoglobin 11.9 (*)    HCT 34.5 (*)  nRBC 0.6 (*)    All other components within normal limits  URINALYSIS, ROUTINE W REFLEX MICROSCOPIC - Abnormal; Notable for the following components:   Ketones, ur 5 (*)    All other components within normal limits  TROPONIN I (HIGH SENSITIVITY) - Abnormal; Notable for the following components:   Troponin I (High Sensitivity) 82 (*)    All other components within normal limits  TROPONIN I (HIGH SENSITIVITY) - Abnormal; Notable for the following components:   Troponin I (High Sensitivity) 80 (*)    All other components within normal limits    EKG EKG Interpretation Date/Time:  Wednesday April 13 2024 17:43:09 EDT Ventricular Rate:  92 PR Interval:  146 QRS Duration:  74 QT  Interval:  384 QTC Calculation: 474 R Axis:   85  Text Interpretation: Normal sinus rhythm T wave abnormality, consider inferior ischemia Abnormal ECG When compared with ECG of 30-Aug-2021 11:04, PREVIOUS ECG IS PRESENT no sig change from previous Confirmed by Wynetta Heckle [phone]) on 04/13/2024 6:30:31 PM  Radiology CT Head Wo Contrast Result Date: 04/13/2024 CLINICAL DATA:  Multiple falls with headaches and neck pain, initial encounter EXAM: CT HEAD WITHOUT CONTRAST CT CERVICAL SPINE WITHOUT CONTRAST TECHNIQUE: Multidetector CT imaging of the head and cervical spine was performed following the standard protocol without intravenous contrast. Multiplanar CT image reconstructions of the cervical spine were also generated. RADIATION DOSE REDUCTION: This exam was performed according to the departmental dose-optimization program which includes automated exposure control, adjustment of the mA and/or kV according to patient size and/or use of iterative reconstruction technique. COMPARISON:  None Available. FINDINGS: CT HEAD FINDINGS Brain: No evidence of acute infarction, hemorrhage, hydrocephalus, extra-axial collection or mass lesion/mass effect. Chronic atrophic changes are noted. Vascular: No hyperdense vessel or unexpected calcification. Skull: Normal. Negative for fracture or focal lesion. Sinuses/Orbits: No acute finding. Other: None. CT CERVICAL SPINE FINDINGS Alignment: Loss of the normal cervical lordosis is noted. Skull base and vertebrae: 7 cervical segments are well visualized. Vertebral body height is well maintained. Multilevel disc space narrowing and osteophytic changes are seen. Facet hypertrophic changes are noted as well. The odontoid is within normal limits. Soft tissues and spinal canal: Surrounding soft tissue structures are within normal limits. Upper chest: Visualized lung apices are unremarkable. Other: None IMPRESSION: CT of the head: Chronic atrophic changes. CT of the cervical spine:  Degenerative change without acute abnormality. Electronically Signed   By: Violeta Grey M.D.   On: 04/13/2024 19:37   CT Cervical Spine Wo Contrast Result Date: 04/13/2024 CLINICAL DATA:  Multiple falls with headaches and neck pain, initial encounter EXAM: CT HEAD WITHOUT CONTRAST CT CERVICAL SPINE WITHOUT CONTRAST TECHNIQUE: Multidetector CT imaging of the head and cervical spine was performed following the standard protocol without intravenous contrast. Multiplanar CT image reconstructions of the cervical spine were also generated. RADIATION DOSE REDUCTION: This exam was performed according to the departmental dose-optimization program which includes automated exposure control, adjustment of the mA and/or kV according to patient size and/or use of iterative reconstruction technique. COMPARISON:  None Available. FINDINGS: CT HEAD FINDINGS Brain: No evidence of acute infarction, hemorrhage, hydrocephalus, extra-axial collection or mass lesion/mass effect. Chronic atrophic changes are noted. Vascular: No hyperdense vessel or unexpected calcification. Skull: Normal. Negative for fracture or focal lesion. Sinuses/Orbits: No acute finding. Other: None. CT CERVICAL SPINE FINDINGS Alignment: Loss of the normal cervical lordosis is noted. Skull base and vertebrae: 7 cervical segments are well visualized. Vertebral body height is well maintained. Multilevel disc  space narrowing and osteophytic changes are seen. Facet hypertrophic changes are noted as well. The odontoid is within normal limits. Soft tissues and spinal canal: Surrounding soft tissue structures are within normal limits. Upper chest: Visualized lung apices are unremarkable. Other: None IMPRESSION: CT of the head: Chronic atrophic changes. CT of the cervical spine: Degenerative change without acute abnormality. Electronically Signed   By: Violeta Grey M.D.   On: 04/13/2024 19:37   DG Chest 2 View Result Date: 04/13/2024 CLINICAL DATA:  Chest pain short of  breath EXAM: CHEST - 2 VIEW COMPARISON:  08/30/2021 FINDINGS: Emphysema. Patchy right middle and lower lobe opacity. No pleural effusion. Normal cardiac size. No pneumothorax IMPRESSION: Patchy right middle and lower lobe opacity suspicious for pneumonia. Radiographic follow-up to resolution is recommended. Electronically Signed   By: Esmeralda Hedge M.D.   On: 04/13/2024 18:37    Procedures Procedures    Medications Ordered in ED Medications  aspirin tablet 325 mg (has no administration in time range)  lactated ringers  bolus 1,000 mL (has no administration in time range)  cefTRIAXone  (ROCEPHIN ) 1 g in sodium chloride  0.9 % 100 mL IVPB (has no administration in time range)  doxycycline  (VIBRA -TABS) tablet 100 mg (has no administration in time range)    ED Course/ Medical Decision Making/ A&P Clinical Course as of 04/13/24 2200  Wed Apr 13, 2024  2128 CT Cervical Spine Wo Contrast [LB]    Clinical Course User Index [LB] Royann Cords, PA                                 Medical Decision Making Amount and/or Complexity of Data Reviewed Labs: ordered. Radiology: ordered. Decision-making details documented in ED Course.  Risk OTC drugs. Prescription drug management. Decision regarding hospitalization.   Patient presents to the ED for concern of CP, SHOB, this involves an extensive number of treatment options, and is a complaint that carries with it a high risk of complications and morbidity.  The differential diagnosis includes ACS, pneumonia, muscle strain, COPD exacerbation, pleural effusion, fluid overload   Co morbidities that complicate the patient evaluation  See HPI   Additional history obtained:  Additional history obtained from Nursing   External records from outside source obtained and reviewed including triage note   Lab Tests:  I Ordered, and personally interpreted labs.  The pertinent results include:   Downtrending troponins from 82-80 Sodium 128 CBG  103 Creatinine 1.62 Hgb 11.9   Imaging Studies ordered:  I ordered imaging studies including CT head, cervical spine, CXR  I independently visualized and interpreted imaging which showed no traumatic injury from fall, right middle and lower lobe pneumonia I agree with the radiologist interpretation   Cardiac Monitoring:  The patient was maintained on a cardiac monitor.  I personally viewed and interpreted the cardiac monitored which showed an underlying rhythm of: NSR   Medicines ordered and prescription drug management:  I ordered medication including LR, rocephin  and doxy  for AKI, PNA  Reevaluation of the patient after these medicines showed that the patient stayed the same I have reviewed the patients home medicines and have made adjustments as needed     Consultations Obtained:  I requested consultation with cardiology,  and discussed lab and imaging findings as well as pertinent plan - they report no ACS with flat troponin and no further cardiac interventions at this time I requested consultation with hospitalist, and discussed  lab and imaging findings as well as pertinent plan-Dr. Brock Canner accepts patient for admission   Problem List / ED Course:  Fall Sustained a superficial laceration to right eyebrow and left forehead.  Do not need laceration repair CT head negative for ICH.  Cervical neck unremarkable No other signs of trauma from fall.  No TTP of spine.  No obvious deformities no TTP of long bones or joints No thinners A&Ox3.  Neurologically intact Chest pain SHOB Described as tightness with associated nausea and shortness of breath.  No radiation. Provided 324 mg aspirin. Troponin downtrending from 82-80. Chest x-ray notable for right middle and lower lobe pneumonia which may be contributing to symptoms No signs of fluid overload with no pedal edema bilaterally PNA Also has associated rhinorrhea and productive cough Administer Rocephin  and Doxy as patient  has history of prolonged QT and did not want to administer Zithromax  Maintaining oxygen saturation without difficulty. AKI Baseline 0.85-1 over past three years. Currently 1.62 Provided LR bolus  Reevaluation:  After the interventions noted above, I reevaluated the patient and found that they have :stayed the same    Dispostion:  After consideration of the diagnostic results and the patients response to treatment, I feel that the patent would benefit from admission for pna and aki.   Discussed ED workup, dispo with patient who expresses understanding and agrees with plan  Final Clinical Impression(s) / ED Diagnoses Final diagnoses:  AKI (acute kidney injury) (HCC)  Pneumonia of right lung due to infectious organism, unspecified part of lung    Rx / DC Orders ED Discharge Orders     None         Royann Cords, PA 04/13/24 2241    Hershel Los, MD 04/13/24 2311

## 2024-04-13 NOTE — ED Triage Notes (Signed)
 Pt BIB [facility] from home with reports of CP and SHOB. Pt reports that started after falling on Saturday. No thinners, did hit head.

## 2024-04-13 NOTE — ED Notes (Signed)
Multiple IV attempts without success.

## 2024-04-13 NOTE — ED Notes (Signed)
 CCMD contacted to place pt on cardiac monitoring

## 2024-04-14 ENCOUNTER — Inpatient Hospital Stay (HOSPITAL_COMMUNITY)

## 2024-04-14 ENCOUNTER — Encounter (HOSPITAL_COMMUNITY): Payer: Self-pay | Admitting: Internal Medicine

## 2024-04-14 DIAGNOSIS — E871 Hypo-osmolality and hyponatremia: Secondary | ICD-10-CM | POA: Diagnosis present

## 2024-04-14 DIAGNOSIS — A419 Sepsis, unspecified organism: Secondary | ICD-10-CM

## 2024-04-14 DIAGNOSIS — R531 Weakness: Secondary | ICD-10-CM

## 2024-04-14 DIAGNOSIS — N179 Acute kidney failure, unspecified: Secondary | ICD-10-CM | POA: Diagnosis not present

## 2024-04-14 DIAGNOSIS — W19XXXA Unspecified fall, initial encounter: Secondary | ICD-10-CM

## 2024-04-14 DIAGNOSIS — J189 Pneumonia, unspecified organism: Secondary | ICD-10-CM | POA: Diagnosis not present

## 2024-04-14 DIAGNOSIS — R7989 Other specified abnormal findings of blood chemistry: Secondary | ICD-10-CM

## 2024-04-14 DIAGNOSIS — I50810 Right heart failure, unspecified: Secondary | ICD-10-CM

## 2024-04-14 DIAGNOSIS — R079 Chest pain, unspecified: Secondary | ICD-10-CM

## 2024-04-14 DIAGNOSIS — R0789 Other chest pain: Secondary | ICD-10-CM | POA: Diagnosis not present

## 2024-04-14 DIAGNOSIS — I5032 Chronic diastolic (congestive) heart failure: Secondary | ICD-10-CM | POA: Diagnosis present

## 2024-04-14 LAB — CBC WITH DIFFERENTIAL/PLATELET
Abs Immature Granulocytes: 0.07 10*3/uL (ref 0.00–0.07)
Basophils Absolute: 0.1 10*3/uL (ref 0.0–0.1)
Basophils Relative: 1 %
Eosinophils Absolute: 0.1 10*3/uL (ref 0.0–0.5)
Eosinophils Relative: 1 %
HCT: 31.2 % — ABNORMAL LOW (ref 39.0–52.0)
Hemoglobin: 10.6 g/dL — ABNORMAL LOW (ref 13.0–17.0)
Immature Granulocytes: 1 %
Lymphocytes Relative: 17 %
Lymphs Abs: 1.4 10*3/uL (ref 0.7–4.0)
MCH: 30.5 pg (ref 26.0–34.0)
MCHC: 34 g/dL (ref 30.0–36.0)
MCV: 89.7 fL (ref 80.0–100.0)
Monocytes Absolute: 0.8 10*3/uL (ref 0.1–1.0)
Monocytes Relative: 9 %
Neutro Abs: 6.1 10*3/uL (ref 1.7–7.7)
Neutrophils Relative %: 71 %
Platelets: 174 10*3/uL (ref 150–400)
RBC: 3.48 MIL/uL — ABNORMAL LOW (ref 4.22–5.81)
RDW: 14.2 % (ref 11.5–15.5)
WBC: 8.5 10*3/uL (ref 4.0–10.5)
nRBC: 0.5 % — ABNORMAL HIGH (ref 0.0–0.2)

## 2024-04-14 LAB — MAGNESIUM
Magnesium: 1.7 mg/dL (ref 1.7–2.4)
Magnesium: 1.9 mg/dL (ref 1.7–2.4)

## 2024-04-14 LAB — COMPREHENSIVE METABOLIC PANEL WITH GFR
ALT: 11 U/L (ref 0–44)
AST: 15 U/L (ref 15–41)
Albumin: 3 g/dL — ABNORMAL LOW (ref 3.5–5.0)
Alkaline Phosphatase: 44 U/L (ref 38–126)
Anion gap: 14 (ref 5–15)
BUN: 20 mg/dL (ref 8–23)
CO2: 17 mmol/L — ABNORMAL LOW (ref 22–32)
Calcium: 8.9 mg/dL (ref 8.9–10.3)
Chloride: 98 mmol/L (ref 98–111)
Creatinine, Ser: 1.43 mg/dL — ABNORMAL HIGH (ref 0.61–1.24)
GFR, Estimated: 49 mL/min — ABNORMAL LOW (ref >60)
Glucose, Bld: 100 mg/dL — ABNORMAL HIGH (ref 70–99)
Potassium: 4.1 mmol/L (ref 3.5–5.1)
Sodium: 129 mmol/L — ABNORMAL LOW (ref 135–145)
Total Bilirubin: 1.1 mg/dL (ref 0.0–1.2)
Total Protein: 5.8 g/dL — ABNORMAL LOW (ref 6.5–8.1)

## 2024-04-14 LAB — D-DIMER, QUANTITATIVE: D-Dimer, Quant: 2.1 ug{FEU}/mL — ABNORMAL HIGH (ref 0.00–0.50)

## 2024-04-14 LAB — ECHOCARDIOGRAM COMPLETE
Area-P 1/2: 3.21 cm2
S' Lateral: 2.1 cm

## 2024-04-14 LAB — PHOSPHORUS: Phosphorus: 3 mg/dL (ref 2.5–4.6)

## 2024-04-14 LAB — OSMOLALITY: Osmolality: 278 mosm/kg (ref 275–295)

## 2024-04-14 LAB — TSH: TSH: 2.007 u[IU]/mL (ref 0.350–4.500)

## 2024-04-14 LAB — STREP PNEUMONIAE URINARY ANTIGEN: Strep Pneumo Urinary Antigen: NEGATIVE

## 2024-04-14 LAB — PROCALCITONIN: Procalcitonin: 2.94 ng/mL

## 2024-04-14 LAB — BRAIN NATRIURETIC PEPTIDE: B Natriuretic Peptide: 1164.6 pg/mL — ABNORMAL HIGH (ref 0.0–100.0)

## 2024-04-14 MED ORDER — HEPARIN (PORCINE) 25000 UT/250ML-% IV SOLN
1050.0000 [IU]/h | INTRAVENOUS | Status: DC
  Administered 2024-04-14: 1200 [IU]/h via INTRAVENOUS
  Administered 2024-04-15: 1050 [IU]/h via INTRAVENOUS
  Filled 2024-04-14 (×2): qty 250

## 2024-04-14 MED ORDER — HEPARIN BOLUS VIA INFUSION
5000.0000 [IU] | Freq: Once | INTRAVENOUS | Status: AC
  Administered 2024-04-14: 5000 [IU] via INTRAVENOUS
  Filled 2024-04-14: qty 5000

## 2024-04-14 MED ORDER — SODIUM CHLORIDE 0.9 % IV BOLUS
500.0000 mL | Freq: Once | INTRAVENOUS | Status: AC
  Administered 2024-04-14: 500 mL via INTRAVENOUS

## 2024-04-14 MED ORDER — PANTOPRAZOLE SODIUM 40 MG PO TBEC
40.0000 mg | DELAYED_RELEASE_TABLET | Freq: Every day | ORAL | Status: DC
  Administered 2024-04-14 – 2024-04-18 (×5): 40 mg via ORAL
  Filled 2024-04-14 (×5): qty 1

## 2024-04-14 MED ORDER — PROCHLORPERAZINE EDISYLATE 10 MG/2ML IJ SOLN
10.0000 mg | Freq: Four times a day (QID) | INTRAMUSCULAR | Status: DC | PRN
  Administered 2024-04-14: 10 mg via INTRAVENOUS
  Filled 2024-04-14: qty 2

## 2024-04-14 MED ORDER — IOHEXOL 350 MG/ML SOLN
75.0000 mL | Freq: Once | INTRAVENOUS | Status: AC | PRN
  Administered 2024-04-14: 75 mL via INTRAVENOUS

## 2024-04-14 MED ORDER — DOXYCYCLINE HYCLATE 100 MG PO TABS
100.0000 mg | ORAL_TABLET | Freq: Two times a day (BID) | ORAL | Status: DC
  Administered 2024-04-14 – 2024-04-18 (×8): 100 mg via ORAL
  Filled 2024-04-14 (×8): qty 1

## 2024-04-14 NOTE — Progress Notes (Signed)
 Pharmacy Consult for Heparin Indication: pulmonary embolus  Allergies  Allergen Reactions   Gadolinium Derivatives Other (See Comments)    Unknown reaction   Prilosec [Omeprazole ] Itching    Patient Measurements: Height: 5' 11 (180.3 cm) Weight: 70.9 kg (156 lb 4.9 oz) IBW/kg (Calculated) : 75.3 HEPARIN DW (KG): 70.9  Vital Signs: Temp: 97.6 F (36.4 C) (06/12 1736) Temp Source: Oral (06/12 1736) BP: 97/76 (06/12 1745) Pulse Rate: 106 (06/12 1745)  Labs: Recent Labs    04/13/24 1734 04/14/24 0320  HGB 11.9* 10.6*  HCT 34.5* 31.2*  PLT 202 174  CREATININE 1.62* 1.43*    Estimated Creatinine Clearance: 40.6 mL/min (A) (by C-G formula based on SCr of 1.43 mg/dL (H)).  Assessment: Kenneth Roberts a 83 y.o. male presented with submassive PE. Pharmacy has been consulted for heparin dosing.   Hgb ~11, PLT ~200   Anticoagulation PTA: Patient not on anticoagulation PTA.  Goal of Therapy:  Heparin level 0.3-0.7 units/ml Monitor platelets by anticoagulation protocol: Yes   Plan:  Heparin bolus via infusion 5,000 units x1  Heparin gtt at 1,200 units/hour 8h HL  Daily HL and CBC  F/U PO AC plans   Chrystie Crass, PharmD Clinical Pharmacist  04/14/2024 7:31 PM

## 2024-04-14 NOTE — ED Notes (Signed)
 Per OT, Pt was weak and dizzy with movement was not able to ambulate past the end of his bed.  Pt's O2 remained at 100% and HR increased to 104.  OT reports PT will come to see the Pt soon.

## 2024-04-14 NOTE — Evaluation (Signed)
 Physical Therapy Evaluation Patient Details Name: Kenneth Roberts MRN: [id number] DOB: Nov 27, 1940 Today's Date: 04/14/2024  History of Present Illness  Pt is an 83 yo male admitted to [facility] with c/o SOB found to have CAP. Fell 2-3 days PTA due to tripping; elevated troponins (per Cardiology not ACS--likely pleurisy); PMH - HTN, peripheral neuropathy.  Clinical Impression  Pt admitted secondary to problem above with deficits below. PTA patient lived alone in an apartment with elevator to enter. He was independent with ambulation and rode the bus as his means of transportation. Pt recently with 2 falls at home due to dizziness/faint feeling. Currently he reports dizziness with all mobility (describes as faint feeling). His BP was stable (supine 117/93 HR 96; seated after standing 131/96  HR 99). He does state that he hit his head on the toilet twice (both falls) with ?component of concussion causing dizziness. He was unable to ambulate due to dizziness and discussed inpatient therapies <3 hrs/day and he would not agree to this. He wants to find out what is causing his dizziness and is all he is focused on.  Anticipate patient will benefit from PT to address problems listed below. Will continue to follow acutely to maximize functional mobility, independence, and safety.           If plan is discharge home, recommend the following: Two people to help with walking and/or transfers;Assistance with cooking/housework;Assist for transportation;Help with stairs or ramp for entrance   Can travel by private vehicle   No    Equipment Recommendations Rolling walker (2 wheels)  Recommendations for Other Services       Functional Status Assessment Patient has had a recent decline in their functional status and demonstrates the ability to make significant improvements in function in a reasonable and predictable amount of time.     Precautions / Restrictions Precautions Precautions: Fall Recall of  Precautions/Restrictions: Intact      Mobility  Bed Mobility Overal bed mobility: Needs Assistance Bed Mobility: Supine to Sit, Sit to Supine     Supine to sit: Min assist, HOB elevated Sit to supine: Supervision   General bed mobility comments: on ED stretcher    Transfers Overall transfer level: Needs assistance Equipment used: Rolling walker (2 wheels) Transfers: Sit to/from Stand Sit to Stand: Contact guard assist           General transfer comment: guarding for safety due to recent falls and reports of dizzines    Ambulation/Gait               General Gait Details: pt reports unable due to faint feeling; BP stable  Stairs            Wheelchair Mobility     Tilt Bed    Modified Rankin (Stroke Patients Only)       Balance Overall balance assessment: Needs assistance Sitting-balance support: No upper extremity supported, Feet supported Sitting balance-Leahy Scale: Fair     Standing balance support: Bilateral upper extremity supported, During functional activity, Reliant on assistive device for balance Standing balance-Leahy Scale: Poor                               Pertinent Vitals/Pain Pain Assessment Pain Assessment: 0-10 Pain Score: 8  Pain Location: abdomen/stomach Pain Descriptors / Indicators: Aching Pain Intervention(s): Limited activity within patient's tolerance, Monitored during session    Home Living Family/patient expects to be discharged to:: Private residence Living  Arrangements: Alone Available Help at Discharge: Available PRN/intermittently;Family Type of Home: Apartment Home Access: Elevator       Home Layout: One level Home Equipment: Shower seat;None Additional Comments: does not drive, takes bus. 2 falls in the last 6 months.    Prior Function Prior Level of Function : Independent/Modified Independent;History of Falls (last six months)             Mobility Comments: ind no AD ADLs  Comments: Ind     Extremity/Trunk Assessment   Upper Extremity Assessment Upper Extremity Assessment: Defer to OT evaluation    Lower Extremity Assessment Lower Extremity Assessment: Generalized weakness    Cervical / Trunk Assessment Cervical / Trunk Assessment: Normal  Communication   Communication Communication: Impaired Factors Affecting Communication: Reduced clarity of speech (low volume)    Cognition Arousal: Alert Behavior During Therapy: Anxious   PT - Cognitive impairments: Safety/Judgement                       PT - Cognition Comments: realizes he's not moving well enough to go home yet will not agree to SNF Following commands: Intact       Cueing Cueing Techniques: Verbal cues     General Comments      Exercises     Assessment/Plan    PT Assessment Patient needs continued PT services  PT Problem List Decreased activity tolerance;Decreased balance;Decreased mobility;Decreased knowledge of use of DME;Decreased safety awareness       PT Treatment Interventions DME instruction;Gait training;Functional mobility training;Therapeutic activities;Therapeutic exercise;Balance training;Patient/family education    PT Goals (Current goals can be found in the Care Plan section)  Acute Rehab PT Goals Patient Stated Goal: find out what's wrong PT Goal Formulation: With patient Time For Goal Achievement: 04/28/24 Potential to Achieve Goals: Fair    Frequency Min 2X/week     Co-evaluation               AM-PAC PT 6 Clicks Mobility  Outcome Measure Help needed turning from your back to your side while in a flat bed without using bedrails?: None Help needed moving from lying on your back to sitting on the side of a flat bed without using bedrails?: A Little Help needed moving to and from a bed to a chair (including a wheelchair)?: A Little Help needed standing up from a chair using your arms (e.g., wheelchair or bedside chair)?: A  Little Help needed to walk in hospital room?: Total Help needed climbing 3-5 steps with a railing? : Total 6 Click Score: 15    End of Session Equipment Utilized During Treatment: Gait belt Activity Tolerance: Treatment limited secondary to medical complications (Comment) (dizzy/faint feeling) Patient left: in bed;with call bell/phone within reach (ED stretcher) Nurse Communication: Mobility status;Other (comment) (faint feeling) PT Visit Diagnosis: Difficulty in walking, not elsewhere classified (R26.2);Dizziness and giddiness (R42)    Time: 2956-2130 PT Time Calculation (min) (ACUTE ONLY): 17 min   Charges:   PT Evaluation $PT Eval Low Complexity: 1 Low   PT General Charges $$ ACUTE PT VISIT: 1 Visit          Gayle Kava, PT Acute Rehabilitation Services  Office [phone]   Guilford Leep 04/14/2024, 12:26 PM

## 2024-04-14 NOTE — Evaluation (Signed)
 Occupational Therapy Evaluation Patient Details Name: Kenneth Roberts MRN: [id number] DOB: 08-11-1941 Today's Date: 04/14/2024   History of Present Illness   Pt is an 83 yo male admitted to [facility] with c/o SOB found to have CAP. Fell 2-3 days PTA due to tripping; elevated troponins (per Cardiology not ACS--likely pleurisy); PMH - HTN, peripheral neuropathy.     Clinical Impressions Pt admitted for above, PTA pt reports being ind with ADLs/iADLs and living alone, taking public transport to get around community. Pt currently presenting as generally weak and demonstrating poor activity tolerance. Pt needing min A HHA for slow gait 76ft in room, unable to progress further. Pt dizzy with mobility but BP stable, did hit his head when he fell PTA. Pt also needing max A to setup A for ADLs given his level of weakness. OT to continue to progress pt as able while in acute stay. Patient will benefit from continued inpatient follow up therapy, <3 hours/day, may progress while in acute with continued work with rehab team.     If plan is discharge home, recommend the following:   A little help with walking and/or transfers;A little help with bathing/dressing/bathroom;Assistance with cooking/housework;Assist for transportation;Help with stairs or ramp for entrance     Functional Status Assessment   Patient has had a recent decline in their functional status and demonstrates the ability to make significant improvements in function in a reasonable and predictable amount of time.     Equipment Recommendations   Tub/shower seat     Recommendations for Other Services         Precautions/Restrictions   Precautions Precautions: Fall Recall of Precautions/Restrictions: Intact Restrictions Weight Bearing Restrictions Per Provider Order: No     Mobility Bed Mobility Overal bed mobility: Needs Assistance Bed Mobility: Supine to Sit, Sit to Supine     Supine to sit: Min assist, HOB  elevated Sit to supine: Supervision   General bed mobility comments: on ED stretcher. Assist needed to raise trunk    Transfers Overall transfer level: Needs assistance Equipment used: Rolling walker (2 wheels) Transfers: Sit to/from Stand Sit to Stand: Min assist           General transfer comment: light min A to rise, progressed to CGA. reports dizziness although BP stable      Balance Overall balance assessment: Needs assistance Sitting-balance support: No upper extremity supported, Feet supported Sitting balance-Leahy Scale: Fair     Standing balance support: Bilateral upper extremity supported, During functional activity, Reliant on assistive device for balance Standing balance-Leahy Scale: Poor Standing balance comment: seeks UE support                           ADL either performed or assessed with clinical judgement   ADL Overall ADL's : Needs assistance/impaired Eating/Feeding: Independent;Sitting   Grooming: Sitting;Set up   Upper Body Bathing: Sitting;Set up   Lower Body Bathing: Sit to/from stand;Moderate assistance   Upper Body Dressing : Sitting;Minimal assistance   Lower Body Dressing: Sit to/from stand;Moderate assistance   Toilet Transfer: Minimal assistance;Stand-pivot   Toileting- Clothing Manipulation and Hygiene: Maximal assistance;Sit to/from stand       Functional mobility during ADLs: Minimal assistance (68ft, HHA)       Vision         Perception         Praxis         Pertinent Vitals/Pain Pain Assessment Pain Assessment: 0-10 Pain Score: 8  Pain Location: abdomen/stomach Pain Descriptors / Indicators: Aching Pain Intervention(s): Limited activity within patient's tolerance, Monitored during session, Patient requesting pain meds-RN notified     Extremity/Trunk Assessment Upper Extremity Assessment Upper Extremity Assessment: Generalized weakness   Lower Extremity Assessment Lower Extremity Assessment:  Generalized weakness   Cervical / Trunk Assessment Cervical / Trunk Assessment: Normal   Communication Communication Communication: Impaired Factors Affecting Communication: Reduced clarity of speech (low volume)   Cognition Arousal: Alert Behavior During Therapy: WFL for tasks assessed/performed Cognition: No family/caregiver present to determine baseline             OT - Cognition Comments: Seemed to have slow processing, slow to respond.                 Following commands: Intact       Cueing  General Comments   Cueing Techniques: Verbal cues  Bp 103/74 sitting EOB and remained stable while standing at bedside. Pt continued to c/o dizziness   Exercises     Shoulder Instructions      Home Living Family/patient expects to be discharged to:: Private residence Living Arrangements: Alone Available Help at Discharge: Available PRN/intermittently;Family Type of Home: Apartment Home Access: Elevator     Home Layout: One level     Bathroom Shower/Tub: Walk-in shower         Home Equipment: Shower seat;None   Additional Comments: does not drive, takes bus. 2 falls in the last 6 months.      Prior Functioning/Environment Prior Level of Function : Independent/Modified Independent;History of Falls (last six months)             Mobility Comments: ind no AD ADLs Comments: Ind    OT Problem List: Decreased strength;Impaired balance (sitting and/or standing);Decreased activity tolerance;Pain   OT Treatment/Interventions: Self-care/ADL training;Therapeutic exercise;Balance training;DME and/or AE instruction;Therapeutic activities      OT Goals(Current goals can be found in the care plan section)   Acute Rehab OT Goals Patient Stated Goal: To get stronger OT Goal Formulation: With patient Time For Goal Achievement: 04/28/24 Potential to Achieve Goals: Good ADL Goals Pt Will Perform Grooming: standing;with supervision Pt Will Perform Lower Body  Dressing: sit to/from stand;with supervision Pt Will Transfer to Toilet: ambulating;with supervision (+ LRAD) Pt Will Perform Tub/Shower Transfer: Tub transfer;with supervision Additional ADL Goal #2: Pt will demonstrate independent use of energy conservation techniques to engage in OOB ADLs.   OT Frequency:  Min 2X/week    Co-evaluation              AM-PAC OT 6 Clicks Daily Activity     Outcome Measure Help from another person eating meals?: None Help from another person taking care of personal grooming?: A Little Help from another person toileting, which includes using toliet, bedpan, or urinal?: A Little Help from another person bathing (including washing, rinsing, drying)?: A Little Help from another person to put on and taking off regular upper body clothing?: A Little Help from another person to put on and taking off regular lower body clothing?: A Lot 6 Click Score: 18   End of Session Equipment Utilized During Treatment: Gait belt Nurse Communication: Mobility status  Activity Tolerance: Patient tolerated treatment well Patient left: in bed;with call bell/phone within reach  OT Visit Diagnosis: Unsteadiness on feet (R26.81);Other abnormalities of gait and mobility (R26.89);Muscle weakness (generalized) (M62.81);History of falling (Z91.81);Pain Pain - part of body:  (abdomen)  Time: 1010-1038 OT Time Calculation (min): 28 min Charges:  OT General Charges $OT Visit: 1 Visit OT Evaluation $OT Eval Low Complexity: 1 Low OT Treatments $Therapeutic Activity: 8-22 mins  04/14/2024  AB, OTR/L  Acute Rehabilitation Services  Office: [phone]   Jorene New 04/14/2024, 1:22 PM

## 2024-04-14 NOTE — Plan of Care (Signed)
   Problem: Education: Goal: Knowledge of General Education information will improve Description: Including pain rating scale, medication(s)/side effects and non-pharmacologic comfort measures Outcome: Progressing   Problem: Activity: Goal: Risk for activity intolerance will decrease Outcome: Progressing   Problem: Nutrition: Goal: Adequate nutrition will be maintained Outcome: Progressing   Problem: Coping: Goal: Level of anxiety will decrease Outcome: Progressing   Problem: Elimination: Goal: Will not experience complications related to bowel motility Outcome: Progressing   Problem: Safety: Goal: Ability to remain free from injury will improve Outcome: Progressing   Problem: Skin Integrity: Goal: Risk for impaired skin integrity will decrease Outcome: Progressing

## 2024-04-14 NOTE — Consult Note (Signed)
 NAME:  Mathan Darroch, MRN:  [id number], DOB:  1941/05/16, LOS: 1 ADMISSION DATE:  04/13/2024, CONSULTATION DATE:  6/1 REFERRING MD:  Dr. [facility] Lavender, CHIEF COMPLAINT:  Hypotension   History of Present Illness:  83 year old male with past medical history as below, which is significant for chronic diastolic heart failure, hypertension, GERD, and PE no longer on anticoagulation (completed 3 months in 02/2021) who presented to [facility] emergency department with complaints of shortness of breath, productive cough, generalized weakness, and chest tightness x 4 days all of which worsened after a fall.  Trauma evaluation was negative with the exception of superficial laceration which did not need repair.  Imaging demonstrated right-sided pneumonia.  He was admitted to the hospitalist service for treatment with antibiotics.  He was noted to be intermittently hypotensive and had dizziness with ambulation.  Echocardiogram showed RV dilation and mild systolic dysfunction.  CT angiogram then showed acute PE with right heart strain.  RV/LV ratio was 3.  In the evening hours of 6/12 the patient had persistent hypotension and PCCM was asked to evaluate.  Upon my evaluation the patient has no complaints.  He mentions he was previously short of breath and had chest tightness but currently has no complaints and is feeling a lot better.  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   Significant Hospital Events: Including procedures, antibiotic start and stop dates in addition to other pertinent events     Interim History / Subjective:    Objective    Blood pressure 97/76, pulse (!) 106, temperature 97.6  F (36.4 C), temperature source Oral, resp. rate 17, height 5' 11 (1.803 m), weight 70.9 kg, SpO2 96%.        Intake/Output Summary (Last 24 hours) at 04/14/2024 2059 Last data filed at 04/14/2024 1719 Gross per 24 hour  Intake 1330 ml  Output 300 ml  Net 1030 ml   Filed Weights   04/14/24 1512  Weight: 70.9 kg    Examination: General: Thin adult male in no acute distress HENT: Normocephalic, atraumatic, PERRL Lungs:, Clear bilateral breath sounds Cardiovascular: Regular rate and rhythm Abdomen: Soft, nontender Extremities: No acute deformity or range of motion limitation Neuro: Alert, oriented x 3   Resolved problem list   Assessment and Plan   Hypotension: concern is obviously that hypotension is being caused by PE. He is currently responding to a small NS bolus. He is not tachycardic, tachypneic, or hypoxic. He is satting in the high 90s on room air. Clinically he is not presenting as massive PE, however, he did have an elevated BNP and a mildly elevated troponin at the time of admission and suffered a syncopal event 4 days prior to admission. Echo shows severely dilated RV with mild systolic dysfunction. Patient is also admitted with CAP - Heparin infusion starting now - Discussed the case with IR who will plan to see the patient in the morning. Should he change clinically we will call back sooner.  - No absolute contraindications to systemic lytics from chart review.  - Will transfer to ICU for closer monitoring - Repeat BNP, lactate, troponin  Pulmonary embolism: - Check lower extremity dopplers  CAP: - continue ceftriaxone , doxy - Send blood cultures - Sputum pending  AKI: improving  - Trend chemistry  NAG acidosis - repeat chemistry, if not improving will start Bicarb  Chronic HFpEF HTN - holding home medications for now  Best Practice (right click and Reselect all SmartList Selections daily)   Diet/type: NPO DVT prophylaxis systemic  heparin Pressure ulcer(s): pressure ulcer assessment deferred  GI prophylaxis: N/A Lines: N/A Foley:  N/A Code Status:  full code Last date of multidisciplinary goals of care discussion [ ]   Labs   CBC: Recent Labs  Lab 04/13/24 1734 04/14/24 0320  WBC 9.3 8.5  NEUTROABS  --  6.1  HGB 11.9* 10.6*  HCT 34.5* 31.2*  MCV 88.5 89.7  PLT 202 174    Basic Metabolic Panel: Recent Labs  Lab 04/13/24 1734 04/13/24 2333 04/14/24 0320  NA 128*  --  129*  K 5.1  --  4.1  CL 100  --  98  CO2 19*  --  17*  GLUCOSE 103*  --  100*  BUN 21  --  20  CREATININE 1.62*  --  1.43*  CALCIUM  9.2  --  8.9  MG  --  1.9 1.7  PHOS  --   --  3.0   GFR: Estimated Creatinine Clearance: 40.6 mL/min (A) (by C-G formula based on SCr of 1.43 mg/dL (H)). Recent Labs  Lab 04/13/24 1734 04/13/24 2333 04/14/24 0320  PROCALCITON  --  2.94  --   WBC 9.3  --  8.5    Liver Function Tests: Recent Labs  Lab 04/14/24 0320  AST 15  ALT 11  ALKPHOS 44  BILITOT 1.1  PROT 5.8*  ALBUMIN 3.0*   No results for input(s): LIPASE, AMYLASE in the last 168 hours. No results for input(s): AMMONIA in the last 168 hours.  ABG    Component Value Date/Time   HCO3 19.6 (L) 05/03/2009 0321   TCO2 22 11/07/2014 0407   ACIDBASEDEF 7.0 (H) 05/03/2009 0321   O2SAT 62.0 05/03/2009 0321     Coagulation Profile: No results for input(s): INR, PROTIME in the last 168 hours.  Cardiac Enzymes: No results for input(s): CKTOTAL, CKMB, CKMBINDEX, TROPONINI in the last 168 hours.  HbA1C: Hgb A1c MFr Bld  Date/Time Value Ref Range Status  12/26/2016 09:32 AM 5.5 <5.7 % Final    Comment:      For the purpose of screening for the presence of diabetes:   <5.7%       Consistent with the absence of diabetes 5.7-6.4 %   Consistent with increased risk for diabetes (prediabetes) >=6.5 %     Consistent with diabetes   This assay result is consistent with a decreased risk of diabetes.    Currently, no consensus exists regarding use of hemoglobin A1c for diagnosis of diabetes in children.   According to American Diabetes Association (ADA) guidelines, hemoglobin A1c <7.0% represents optimal control in non-pregnant diabetic patients. Different metrics may apply to specific patient populations. Standards of Medical Care in Diabetes (ADA).       CBG: No results for input(s): GLUCAP in the last 168 hours.  Review of Systems:   Bolds are positive  Constitutional: weight loss, gain, night sweats, Fevers, chills, fatigue .  HEENT: headaches, Sore throat, sneezing, nasal congestion, post nasal drip, Difficulty swallowing, Tooth/dental problems, visual complaints visual changes, ear ache CV:  chest pain, radiates:,Orthopnea, PND, swelling in lower extremities, dizziness, palpitations, syncope.  GI  heartburn, indigestion, abdominal pain, nausea, vomiting, diarrhea, change in bowel habits, loss of appetite, bloody stools.  Resp: cough, productive: , hemoptysis, dyspnea, chest  pain, pleuritic.  Skin: rash or itching or icterus GU: dysuria, change in color of urine, urgency or frequency. flank pain, hematuria  MS: joint pain or swelling. decreased range of motion  Psych: change in mood or affect. depression or anxiety.  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   Surgical History:   Past Surgical History:  Procedure Laterality Date   CATARACT EXTRACTION     Left eye   COLONOSCOPY       Social History:   reports that he quit smoking about 43 years ago. His smoking use included cigarettes. He started  smoking about 78 years ago. He has a 8.8 pack-year smoking history. He has never used smokeless tobacco. He reports that he does not currently use alcohol. He reports that he does not use drugs.   Family History:  His family history includes Diabetes in his brother, father, and sister; Stomach cancer in his mother. There is no history of Colon cancer, Colon polyps, Esophageal cancer, or Pancreatic cancer.   Allergies Allergies  Allergen Reactions   Gadolinium Derivatives Other (See Comments)    Unknown reaction   Prilosec [Omeprazole ] Itching     Home Medications  Prior to Admission medications   Medication Sig Start Date End Date Taking? Authorizing Provider  amLODipine  (NORVASC ) 5 MG tablet Take 5 mg by mouth daily. 10/24/13  Yes [provider]  clotrimazole -betamethasone  (LOTRISONE ) cream Apply 1 Application topically 2 (two) times daily as needed.   Yes [provider]  dorzolamide -timolol  (COSOPT ) 22.3-6.8 MG/ML ophthalmic solution Place 1 drop into both eyes 2 (two) times daily. 11/28/19  Yes [provider]  gabapentin (NEURONTIN) 100 MG capsule Take 100 mg by mouth at bedtime. 02/18/24  Yes [provider]  hydrOXYzine  (ATARAX ) 50 MG tablet Take 50 mg by mouth 2 (two) times daily as needed for itching (sleep). 03/29/24  Yes [provider]  LINZESS 72 MCG capsule Take 72 mcg by mouth daily as needed (for constipation). 02/22/24  Yes [provider]  losartan  (COZAAR ) 100 MG tablet Take 100 mg by mouth daily. 01/27/24  Yes [provider]  pantoprazole  (PROTONIX ) 40 MG tablet Take 40 mg by mouth daily. 03/09/20  Yes [provider]  traZODone  (DESYREL ) 100 MG tablet Take 100 mg by mouth at bedtime.   Yes [provider]  GEMTESA 75 MG TABS Take 75 mg by mouth daily. 02/22/24   [provider]     Critical care time: 46 minutes     Roz Cornelia, AGACNP-BC [facility]  See  Amion for personal pager PCCM on call pager [phone] until 7pm. Please call Elink 7p-7a. [phone]  04/14/2024 11:20 PM

## 2024-04-14 NOTE — ED Notes (Signed)
 Pt ordered another half tray.

## 2024-04-14 NOTE — ED Notes (Signed)
 Pt provided with [place] sandwich and drink, urinal emptied.

## 2024-04-14 NOTE — Progress Notes (Signed)
  Progress Note  Patient Name: Kenneth Roberts Date of Encounter: 04/14/2024 [facility] Cardiologist: None    Interval Summary    See consult note from this am   Additional hx  Echo shows a markedly dilated and hypocontractile RV  He states that his general weakness started acutely on Saturday   I think he needs to have a CTA of his lungs to look for PE. Dr. Rudine Cos has suggested we get a d-dimer and proceed from there    Vital Signs Vitals:   04/14/24 1230 04/14/24 1239 04/14/24 1330 04/14/24 1400  BP: 116/82  103/76   Pulse: 88  90   Resp: 12  14   Temp:  97.8 F (36.6 C)  97.8 F (36.6 C)  TempSrc:  Oral    SpO2: 100%  100%     Intake/Output Summary (Last 24 hours) at 04/14/2024 1411 Last data filed at 04/14/2024 1610 Gross per 24 hour  Intake 1080 ml  Output 200 ml  Net 880 ml      06/02/2022    9:56 AM 12/10/2021    8:47 AM 10/27/2021    2:30 AM  Last 3 Weights  Weight (lbs) 186 lb 193 lb 196 lb 3.4 oz  Weight (kg) 84.369 kg 87.544 kg 89 kg      Telemetry/ECG  NSR  - Personally Reviewed  Physical Exam  GEN: No acute distress.   Neck: No JVD Cardiac: few basilar rales  Respiratory: Clear to auscultation bilaterally. GI: Soft, nontender, non-distended  MS: No edema  Assessment & Plan     Acute weakness with markedly dilated and hypocontractile RV.   Suspicious for an acute PE .   Dr. Rudine Cos has ordered a d-dimer ( conterned about mild AKI)   His symptoms started 5 days ago  Following      For questions or updates, please contact [facility] Please consult [email or web address] for contact info under       Signed, Ahmad Alert, MD

## 2024-04-14 NOTE — Significant Event (Signed)
 The radiologist notified me that patient's CT angiogram shows acute PE with evidence of heart strain.  2D echo done earlier noted.  Reviewed patient's labs notes and medications.  On exam at bedside patient is not in distress.  Systolic blood pressure in the 80s.  Heart rate 90/min.  Patient denies any chest pain or shortness of breath this time.  I started patient on heparin infusion PE protocol.  I consulted pulmonary critical care.  Will trend cardiac markers check lactic acid.  I also ordered normal saline bolus.  Kenneth Roberts.

## 2024-04-14 NOTE — ED Notes (Signed)
 Pt to ECHO

## 2024-04-14 NOTE — Consult Note (Signed)
 Cardiology Consultation   Patient ID: Kenneth Roberts MRN: [id number]; DOB: April 11, 1941  Admit date: 04/13/2024 Date of Consult: 04/14/2024  PCP:  Shannan Dart., FNP   [facility] Providers Cardiologist:  None        Patient Profile: Kenneth Roberts is a 83 y.o. male with a hx of HTN, HLD, and BPH who is being seen 04/14/2024 for the evaluation of elevated troponins at the request of Paris Bolds.  History of Present Illness: Kenneth Roberts reports that Saturday he was using the bathroom when he developed chest pain and loss consciousness.  He is not sure how long he was unconscious for but when he came to he still noted discomfort in his chest.  He subsequently had several falls as he tried to exit the bathroom.  Over the next few days he continued to have constant chest discomfort and cough.  The chest pain was sharp in quality, central, nonradiating, and worse with inspiration.  No other major symptoms reported.  He ultimately decided to come to the ED as his symptoms did not abate.  The patient states that he has never had this type pain before.  He denies tobacco, alcohol, illicit drug use.  No notable family history of CAD.  He underwent a NM SPECT on 6 03/2019 which was negative.  In the ED his VS were afebrile, HR 94, BP 113/91, RR 16, satting 90% on RA.  Labs notable for NA 128, bicarb 19, creatinine 1.62, normal WBC, hemoglobin 11.9, troponins 82 -> 80.  ECG showed NSR with nonspecific T wave changes.  CXR revealed a patchy right middle and lower lobe opacity suspicious for pneumonia.  Given his elevated troponin, cardiology consulted for evaluation.   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 Authorizing Provider  amLODipine  (NORVASC ) 5 MG tablet Take 5 mg by mouth daily. 10/24/13  Yes [provider]  gabapentin (NEURONTIN) 100 MG capsule Take 100 mg by mouth at bedtime. 02/18/24  Yes [provider]  hydrOXYzine  (ATARAX ) 50 MG tablet Take 50 mg by mouth 2 (two) times daily as needed for itching (sleep). 03/29/24  Yes [provider]  LINZESS 72 MCG capsule Take 72 mcg by mouth daily as needed (for constipation). 02/22/24  Yes [provider]  losartan  (COZAAR ) 100 MG tablet Take 100 mg by mouth daily. 01/27/24  Yes [provider]  pantoprazole  (PROTONIX ) 40 MG tablet Take 40 mg by mouth daily. 03/09/20  Yes [provider]  traZODone  (DESYREL ) 100 MG tablet Take 100 mg by mouth at bedtime.   Yes [provider]  dorzolamide -timolol  (COSOPT ) 22.3-6.8 MG/ML ophthalmic solution Place 1 drop into both eyes 2 (two) times daily. 11/28/19   [provider]  GEMTESA 75 MG TABS Take 75 mg by mouth daily.  02/22/24   [provider]  mirtazapine (REMERON) 7.5 MG tablet Take 7.5 mg by mouth at bedtime. Patient not taking: Reported on 04/14/2024 02/18/24   [provider]    Scheduled Meds:  aspirin  325 mg Oral Daily   guaiFENesin   600 mg Oral BID   Continuous Infusions:  cefTRIAXone  (ROCEPHIN )  IV 1 g (04/14/24 0034)   cefTRIAXone  (ROCEPHIN )  IV     doxycycline  (VIBRAMYCIN ) IV     lactated ringers      lactated ringers      PRN Meds: acetaminophen  **OR** acetaminophen , albuterol, benzonatate, melatonin, sodium chloride  flush  Allergies:    Allergies  Allergen Reactions   Gadolinium Derivatives Other (See Comments)    Unknown reaction    Prilosec [Omeprazole ] Itching    Social History:   Social History   Socioeconomic History   Marital status: Married    Spouse name: Not on file   Number of children: 8   Years of education: Not on file   Highest education level: Not on file  Occupational History   Occupation: Retired    Associate Professor: RETIRED   Occupation: Retired  Tobacco Use   Smoking status: Former    Current packs/day: 0.00    Average packs/day: 0.3 packs/day for 35.0 years (8.8 ttl pk-yrs)    Types: Cigarettes    Start date: 02/16/1946    Quit date: 02/16/1981    Years since quitting: 43.1   Smokeless tobacco: Never  Vaping Use   Vaping status: Never Used  Substance and Sexual Activity   Alcohol use: Yes    Comment: been drinking a lot   Drug use: No   Sexual activity: Yes    Birth control/protection: Condom  Other Topics Concern   Not on file  Social History Narrative   ** Merged History Encounter **       Social Drivers of Corporate investment banker Strain: Not on file  Food Insecurity: Not on file  Transportation Needs: Not on file  Physical Activity: Not on file  Stress: Not on file  Social Connections: Unknown (03/17/2022)   Received [facility]   Social Network    Social Network: Not on file  Intimate Partner Violence: Unknown (02/06/2022)   Received from [facility]   HITS    Physically Hurt: Not on file    Insult or Talk Down To: Not on file    Threaten Physical Harm: Not on file    Scream or Curse: Not on file    Family History:   Family History  Problem Relation Age of Onset   Stomach cancer Mother    Diabetes Father    Diabetes Sister    Diabetes Brother    Colon cancer Neg Hx    Colon polyps Neg Hx    Esophageal cancer Neg Hx    Pancreatic cancer Neg Hx      ROS:  Please see the history of present illness.  All other ROS reviewed and negative.     Physical Exam/Data: Vitals:   04/13/24 2130 04/13/24 2145 04/13/24 2200  04/13/24 2230  BP: 106/80 103/81 101/80 116/77  Pulse:  90 88 90  Resp: (!) 23 18 17 14   Temp:      TempSrc:      SpO2:  97% 99% 100%   No intake or output data in the 24 hours ending 04/14/24 0042    06/02/2022    9:56 AM 12/10/2021  8:47 AM 10/27/2021    2:30 AM  Last 3 Weights  Weight (lbs) 186 lb 193 lb 196 lb 3.4 oz  Weight (kg) 84.369 kg 87.544 kg 89 kg     There is no height or weight on file to calculate BMI.  General:  Well nourished, well developed, in no acute distress, NAD HEENT: Poor dentition, atraumatic, normocephalic Neck: no JVD Vascular: No carotid bruits; radial pulses 2+ bilaterally Cardiac:  normal S1, S2; RRR; no murmur, rubs or gallops Lungs: Focal Rales in the right lung base, no wheeze or rhonchi Abd: soft and nondistended, mild generalized tenderness palpation Ext: no edema Musculoskeletal:  No deformities, grossly normal Skin: warm and dry  Neuro:  CNs 2-12 intact, no focal abnormalities noted Psych:  Normal affect   EKG:  The EKG was personally reviewed and demonstrates: NSR with nonspecific T wave changes     Telemetry:  Telemetry was personally reviewed and demonstrates: NSR  Relevant CV Studies:  TTE 11/07/20:  IMPRESSIONS     1. Left ventricular ejection fraction, by estimation, is 60 to 65%. The  left ventricle has normal function. The left ventricle has no regional  wall motion abnormalities. There is mild concentric left ventricular  hypertrophy. Left ventricular diastolic  parameters are consistent with Grade I diastolic dysfunction (impaired  relaxation). The average left ventricular global longitudinal strain is  -18.6 %. The global longitudinal strain is normal.   2. Right ventricular systolic function is normal. The right ventricular  size is normal.   3. The mitral valve is normal in structure. No evidence of mitral valve  regurgitation. No evidence of mitral stenosis.   4. The aortic valve is normal in structure. Aortic  valve regurgitation is  not visualized. No aortic stenosis is present.   5. The inferior vena cava is normal in size with greater than 50%  respiratory variability, suggesting right atrial pressure of 3 mmHg.    NM SPECT 04/08/2019:  IMPRESSION: 1. No reversible ischemia or infarction.   2. Normal left ventricular wall motion.   3. Left ventricular ejection fraction 54%   4. Non invasive risk stratification*: Low  Laboratory Data: High Sensitivity Troponin:   Recent Labs  Lab 04/13/24 1734 04/13/24 1934  TROPONINIHS 82* 80*     Chemistry Recent Labs  Lab 04/13/24 1734 04/13/24 2333  NA 128*  --   K 5.1  --   CL 100  --   CO2 19*  --   GLUCOSE 103*  --   BUN 21  --   CREATININE 1.62*  --   CALCIUM  9.2  --   MG  --  1.9  GFRNONAA 42*  --   ANIONGAP 9  --     No results for input(s): PROT, ALBUMIN, AST, ALT, ALKPHOS, BILITOT in the last 168 hours. Lipids No results for input(s): CHOL, TRIG, HDL, LABVLDL, LDLCALC, CHOLHDL in the last 168 hours.  Hematology Recent Labs  Lab 04/13/24 1734  WBC 9.3  RBC 3.90*  HGB 11.9*  HCT 34.5*  MCV 88.5  MCH 30.5  MCHC 34.5  RDW 14.2  PLT 202   Thyroid  No results for input(s): TSH, FREET4 in the last 168 hours.  BNPNo results for input(s): BNP, PROBNP in the last 168 hours.  DDimer No results for input(s): DDIMER in the last 168 hours.  Radiology/Studies:  CT Head Wo Contrast Result Date: 04/13/2024 CLINICAL DATA:  Multiple falls with headaches and neck pain, initial encounter EXAM: CT HEAD WITHOUT  CONTRAST CT CERVICAL SPINE WITHOUT CONTRAST TECHNIQUE: Multidetector CT imaging of the head and cervical spine was performed following the standard protocol without intravenous contrast. Multiplanar CT image reconstructions of the cervical spine were also generated. RADIATION DOSE REDUCTION: This exam was performed according to the departmental dose-optimization program which includes automated  exposure control, adjustment of the mA and/or kV according to patient size and/or use of iterative reconstruction technique. COMPARISON:  None Available. FINDINGS: CT HEAD FINDINGS Brain: No evidence of acute infarction, hemorrhage, hydrocephalus, extra-axial collection or mass lesion/mass effect. Chronic atrophic changes are noted. Vascular: No hyperdense vessel or unexpected calcification. Skull: Normal. Negative for fracture or focal lesion. Sinuses/Orbits: No acute finding. Other: None. CT CERVICAL SPINE FINDINGS Alignment: Loss of the normal cervical lordosis is noted. Skull base and vertebrae: 7 cervical segments are well visualized. Vertebral body height is well maintained. Multilevel disc space narrowing and osteophytic changes are seen. Facet hypertrophic changes are noted as well. The odontoid is within normal limits. Soft tissues and spinal canal: Surrounding soft tissue structures are within normal limits. Upper chest: Visualized lung apices are unremarkable. Other: None IMPRESSION: CT of the head: Chronic atrophic changes. CT of the cervical spine: Degenerative change without acute abnormality. Electronically Signed   By: Violeta Grey M.D.   On: 04/13/2024 19:37   CT Cervical Spine Wo Contrast Result Date: 04/13/2024 CLINICAL DATA:  Multiple falls with headaches and neck pain, initial encounter EXAM: CT HEAD WITHOUT CONTRAST CT CERVICAL SPINE WITHOUT CONTRAST TECHNIQUE: Multidetector CT imaging of the head and cervical spine was performed following the standard protocol without intravenous contrast. Multiplanar CT image reconstructions of the cervical spine were also generated. RADIATION DOSE REDUCTION: This exam was performed according to the departmental dose-optimization program which includes automated exposure control, adjustment of the mA and/or kV according to patient size and/or use of iterative reconstruction technique. COMPARISON:  None Available. FINDINGS: CT HEAD FINDINGS Brain: No  evidence of acute infarction, hemorrhage, hydrocephalus, extra-axial collection or mass lesion/mass effect. Chronic atrophic changes are noted. Vascular: No hyperdense vessel or unexpected calcification. Skull: Normal. Negative for fracture or focal lesion. Sinuses/Orbits: No acute finding. Other: None. CT CERVICAL SPINE FINDINGS Alignment: Loss of the normal cervical lordosis is noted. Skull base and vertebrae: 7 cervical segments are well visualized. Vertebral body height is well maintained. Multilevel disc space narrowing and osteophytic changes are seen. Facet hypertrophic changes are noted as well. The odontoid is within normal limits. Soft tissues and spinal canal: Surrounding soft tissue structures are within normal limits. Upper chest: Visualized lung apices are unremarkable. Other: None IMPRESSION: CT of the head: Chronic atrophic changes. CT of the cervical spine: Degenerative change without acute abnormality. Electronically Signed   By: Violeta Grey M.D.   On: 04/13/2024 19:37   DG Chest 2 View Result Date: 04/13/2024 CLINICAL DATA:  Chest pain short of breath EXAM: CHEST - 2 VIEW COMPARISON:  08/30/2021 FINDINGS: Emphysema. Patchy right middle and lower lobe opacity. No pleural effusion. Normal cardiac size. No pneumothorax IMPRESSION: Patchy right middle and lower lobe opacity suspicious for pneumonia. Radiographic follow-up to resolution is recommended. Electronically Signed   By: Esmeralda Hedge M.D.   On: 04/13/2024 18:37     Assessment and Plan: Calyn Rubi is a 83 y.o. male with a hx of HTN, HLD, and BPH who is being seen 04/14/2024 for the evaluation of elevated troponins at the request of Paris Bolds.  #Elevated Toponins #CAP :: Patient incidentally found to have elevated but flat troponins  in the setting of community-acquired pneumonia and AKI.  This troponin trend does not represent ACS.  I suspect that the patient's chest pain is secondary to pleurisy in relation to his  pneumonia.  No further cardiac workup indicated at this time.  Defer CAP treatment to primary team.   Risk Assessment/Risk Scores:             For questions or updates, please contact [facility] Please consult [email or web address] for contact info under    Signed, Christena Covert, MD  04/14/2024 12:42 AM

## 2024-04-14 NOTE — Progress Notes (Signed)
 PROGRESS NOTE    Kenneth Roberts  JWJ:[id number] DOB: 08/19/1941 DOA: 04/13/2024 PCP: Shannan Dart., FNP   Brief Narrative:  Kenneth Roberts is a 83 y.o. male with medical history significant for chronic diastolic heart failure, essential pretension, GERD, who is admitted to [facility] on 04/13/2024 with community-acquired pneumonia after presenting from home to [facility] ED complaining of shortness of breath with questionable dizziness and near syncope.    Assessment & Plan:   Principal Problem:   CAP (community acquired pneumonia) Active Problems:   Essential hypertension   GERD (gastroesophageal reflux disease)   Atypical chest pain   AKI (acute kidney injury) (HCC)   Elevated troponin   Fall at home, initial encounter   Acute hyponatremia   Generalized weakness   Chronic diastolic CHF (congestive heart failure) (HCC)   Sepsis secondary to community-acquired pneumonia - Sepsis criteria met with tachycardia, tachypnea plus source given pneumonia - Patient not hypoxic although dyspneic even at rest -ambulatory O2 screen pending - Continue broad-spectrum doxycycline  ceftriaxone  for coverage, narrow as cultures become available, plan for 5-day antibiotic course  Hypotension questionably orthostatic Presyncope/vertigo Ambulatory dysfunction and mechanical fall -Patient reports weakness as well as dizziness and difficulty ambulating -Imaging without obvious overt fracture or dislocation -Baseline ambulates without any assistance or device -PT/OT to follow; orthostatics pending -likely to be inaccurate given patient has already had IV fluids - Potentially complicated by infection and respiratory status as above  AKI Hypovolemic hyponatremia, mild - Baseline creatinine 0.8, at intake elevated to 1.6 - Downtrending with IV fluids increase p.o. intake, continue to follow - Sodium normalizing with support  Pleuritic chest pain, ACS ruled out Minimally elevated troponin,  supply/demand mismatch -Appreciate cardiology insight and recommendations - No further indication for further evaluation or imaging at this time given resolution of symptoms  Heart failure, diastolic with preserved ejection fraction EF 60-65% - Currently well compensated, continue to advance diet as tolerated - Restart home meds in the next 24 to 48 hours pending renal recovery  Hypertension -resume home medications once transient hypotension has resolved GERD -resume home meds  DVT prophylaxis: SCDs Start: 04/13/24 2242 Code Status:   Code Status: Full Code Family Communication: Full code  Status is: Inpatient  Dispo: The patient is from: Home              Anticipated d/c is to: Home              Anticipated d/c date is: 24 to 48 hours              Patient currently not medically stable for discharge  Consultants:  None  Procedures:  None  Antimicrobials:  Doxycycline , ceftriaxone  x 5 days  Subjective: No acute issues or events overnight, respiratory status appears to be improving but not yet back to baseline, patient continues to feel very weak and dizzy even at rest today -he also continues to complain of neck and head pain from where he fell.  Otherwise denies nausea vomiting diarrhea constipation headache fevers chills or chest pain  Objective: Vitals:   04/14/24 0201 04/14/24 0500 04/14/24 0600 04/14/24 0609  BP: 109/78 95/77 (!) 89/68   Pulse: 91 90 94   Resp: 18 16 16    Temp: 97.9 F (36.6 C)   98 F (36.7 C)  TempSrc: Oral   Oral  SpO2: 100% 98% 99%     Intake/Output Summary (Last 24 hours) at 04/14/2024 0705 Last data filed at 04/14/2024 6213 Gross per  24 hour  Intake 1080 ml  Output 200 ml  Net 880 ml   There were no vitals filed for this visit.  Examination:  General:  Pleasantly resting in bed, No acute distress. HEENT:  Normocephalic atraumatic.  Sclerae nonicteric, noninjected.  Extraocular movements intact bilaterally. Neck:  Without mass or  deformity.  Trachea is midline. Lungs:  Clear to auscultate bilaterally without rhonchi, wheeze, or rales. Heart:  Regular rate and rhythm.  Without murmurs, rubs, or gallops. Abdomen:  Soft, nontender, nondistended.  Without guarding or rebound. Extremities: Without cyanosis, clubbing, edema, or obvious deformity. Skin:  Warm and dry, no erythema.  Data Reviewed: I have personally reviewed following labs and imaging studies  CBC: Recent Labs  Lab 04/13/24 1734 04/14/24 0320  WBC 9.3 8.5  NEUTROABS  --  6.1  HGB 11.9* 10.6*  HCT 34.5* 31.2*  MCV 88.5 89.7  PLT 202 174   Basic Metabolic Panel: Recent Labs  Lab 04/13/24 1734 04/13/24 2333 04/14/24 0320  NA 128*  --  129*  K 5.1  --  4.1  CL 100  --  98  CO2 19*  --  17*  GLUCOSE 103*  --  100*  BUN 21  --  20  CREATININE 1.62*  --  1.43*  CALCIUM  9.2  --  8.9  MG  --  1.9 1.7  PHOS  --   --  3.0   GFR: CrCl cannot be calculated (Unknown ideal weight.). Liver Function Tests: Recent Labs  Lab 04/14/24 0320  AST 15  ALT 11  ALKPHOS 44  BILITOT 1.1  PROT 5.8*  ALBUMIN 3.0*   Sepsis Labs: Recent Labs  Lab 04/13/24 2333  PROCALCITON 2.94    No results found for this or any previous visit (from the past 240 hours).   Radiology Studies: CT Head Wo Contrast Result Date: 04/13/2024 CLINICAL DATA:  Multiple falls with headaches and neck pain, initial encounter EXAM: CT HEAD WITHOUT CONTRAST CT CERVICAL SPINE WITHOUT CONTRAST TECHNIQUE: Multidetector CT imaging of the head and cervical spine was performed following the standard protocol without intravenous contrast. Multiplanar CT image reconstructions of the cervical spine were also generated. RADIATION DOSE REDUCTION: This exam was performed according to the departmental dose-optimization program which includes automated exposure control, adjustment of the mA and/or kV according to patient size and/or use of iterative reconstruction technique. COMPARISON:  None  Available. FINDINGS: CT HEAD FINDINGS Brain: No evidence of acute infarction, hemorrhage, hydrocephalus, extra-axial collection or mass lesion/mass effect. Chronic atrophic changes are noted. Vascular: No hyperdense vessel or unexpected calcification. Skull: Normal. Negative for fracture or focal lesion. Sinuses/Orbits: No acute finding. Other: None. CT CERVICAL SPINE FINDINGS Alignment: Loss of the normal cervical lordosis is noted. Skull base and vertebrae: 7 cervical segments are well visualized. Vertebral body height is well maintained. Multilevel disc space narrowing and osteophytic changes are seen. Facet hypertrophic changes are noted as well. The odontoid is within normal limits. Soft tissues and spinal canal: Surrounding soft tissue structures are within normal limits. Upper chest: Visualized lung apices are unremarkable. Other: None IMPRESSION: CT of the head: Chronic atrophic changes. CT of the cervical spine: Degenerative change without acute abnormality. Electronically Signed   By: Violeta Grey M.D.   On: 04/13/2024 19:37   CT Cervical Spine Wo Contrast Result Date: 04/13/2024 CLINICAL DATA:  Multiple falls with headaches and neck pain, initial encounter EXAM: CT HEAD WITHOUT CONTRAST CT CERVICAL SPINE WITHOUT CONTRAST TECHNIQUE: Multidetector CT imaging of the head  and cervical spine was performed following the standard protocol without intravenous contrast. Multiplanar CT image reconstructions of the cervical spine were also generated. RADIATION DOSE REDUCTION: This exam was performed according to the departmental dose-optimization program which includes automated exposure control, adjustment of the mA and/or kV according to patient size and/or use of iterative reconstruction technique. COMPARISON:  None Available. FINDINGS: CT HEAD FINDINGS Brain: No evidence of acute infarction, hemorrhage, hydrocephalus, extra-axial collection or mass lesion/mass effect. Chronic atrophic changes are noted.  Vascular: No hyperdense vessel or unexpected calcification. Skull: Normal. Negative for fracture or focal lesion. Sinuses/Orbits: No acute finding. Other: None. CT CERVICAL SPINE FINDINGS Alignment: Loss of the normal cervical lordosis is noted. Skull base and vertebrae: 7 cervical segments are well visualized. Vertebral body height is well maintained. Multilevel disc space narrowing and osteophytic changes are seen. Facet hypertrophic changes are noted as well. The odontoid is within normal limits. Soft tissues and spinal canal: Surrounding soft tissue structures are within normal limits. Upper chest: Visualized lung apices are unremarkable. Other: None IMPRESSION: CT of the head: Chronic atrophic changes. CT of the cervical spine: Degenerative change without acute abnormality. Electronically Signed   By: Violeta Grey M.D.   On: 04/13/2024 19:37   DG Chest 2 View Result Date: 04/13/2024 CLINICAL DATA:  Chest pain short of breath EXAM: CHEST - 2 VIEW COMPARISON:  08/30/2021 FINDINGS: Emphysema. Patchy right middle and lower lobe opacity. No pleural effusion. Normal cardiac size. No pneumothorax IMPRESSION: Patchy right middle and lower lobe opacity suspicious for pneumonia. Radiographic follow-up to resolution is recommended. Electronically Signed   By: Esmeralda Hedge M.D.   On: 04/13/2024 18:37    Scheduled Meds:  aspirin  325 mg Oral Daily   guaiFENesin   600 mg Oral BID   pantoprazole   40 mg Oral Daily   Continuous Infusions:  cefTRIAXone  (ROCEPHIN )  IV     doxycycline  (VIBRAMYCIN ) IV     lactated ringers        LOS: 1 day   Time spent:  Haydee Lipa, DO [facility]  If 7PM-7AM, please contact night-coverage [email or web address]  04/14/2024, 7:05 AM

## 2024-04-15 ENCOUNTER — Inpatient Hospital Stay (HOSPITAL_COMMUNITY)

## 2024-04-15 ENCOUNTER — Telehealth (HOSPITAL_COMMUNITY): Payer: Self-pay | Admitting: Pharmacy Technician

## 2024-04-15 ENCOUNTER — Other Ambulatory Visit (HOSPITAL_COMMUNITY): Payer: Self-pay

## 2024-04-15 DIAGNOSIS — J9601 Acute respiratory failure with hypoxia: Secondary | ICD-10-CM

## 2024-04-15 DIAGNOSIS — J810 Acute pulmonary edema: Secondary | ICD-10-CM

## 2024-04-15 DIAGNOSIS — Z86711 Personal history of pulmonary embolism: Secondary | ICD-10-CM

## 2024-04-15 LAB — BASIC METABOLIC PANEL WITH GFR
Anion gap: 9 (ref 5–15)
BUN: 14 mg/dL (ref 8–23)
CO2: 19 mmol/L — ABNORMAL LOW (ref 22–32)
Calcium: 8.6 mg/dL — ABNORMAL LOW (ref 8.9–10.3)
Chloride: 103 mmol/L (ref 98–111)
Creatinine, Ser: 1.27 mg/dL — ABNORMAL HIGH (ref 0.61–1.24)
GFR, Estimated: 57 mL/min — ABNORMAL LOW (ref >60)
Glucose, Bld: 96 mg/dL (ref 70–99)
Potassium: 4.1 mmol/L (ref 3.5–5.1)
Sodium: 131 mmol/L — ABNORMAL LOW (ref 135–145)

## 2024-04-15 LAB — CBC
HCT: 33.6 % — ABNORMAL LOW (ref 39.0–52.0)
Hemoglobin: 11.9 g/dL — ABNORMAL LOW (ref 13.0–17.0)
MCH: 30.9 pg (ref 26.0–34.0)
MCHC: 35.4 g/dL (ref 30.0–36.0)
MCV: 87.3 fL (ref 80.0–100.0)
Platelets: 171 10*3/uL (ref 150–400)
RBC: 3.85 MIL/uL — ABNORMAL LOW (ref 4.22–5.81)
RDW: 13.7 % (ref 11.5–15.5)
WBC: 7 10*3/uL (ref 4.0–10.5)
nRBC: 0.3 % — ABNORMAL HIGH (ref 0.0–0.2)

## 2024-04-15 LAB — BRAIN NATRIURETIC PEPTIDE: B Natriuretic Peptide: 740.6 pg/mL — ABNORMAL HIGH (ref 0.0–100.0)

## 2024-04-15 LAB — TROPONIN I (HIGH SENSITIVITY): Troponin I (High Sensitivity): 53 ng/L — ABNORMAL HIGH (ref <18)

## 2024-04-15 LAB — MRSA NEXT GEN BY PCR, NASAL: MRSA by PCR Next Gen: NOT DETECTED

## 2024-04-15 LAB — HEPARIN LEVEL (UNFRACTIONATED): Heparin Unfractionated: 1.1 [IU]/mL — ABNORMAL HIGH (ref 0.30–0.70)

## 2024-04-15 LAB — LACTIC ACID, PLASMA: Lactic Acid, Venous: 1.6 mmol/L (ref 0.5–1.9)

## 2024-04-15 LAB — GLUCOSE, CAPILLARY: Glucose-Capillary: 89 mg/dL (ref 70–99)

## 2024-04-15 MED ORDER — ASPIRIN 81 MG PO TBEC
81.0000 mg | DELAYED_RELEASE_TABLET | Freq: Every day | ORAL | Status: DC
  Administered 2024-04-16 – 2024-04-18 (×3): 81 mg via ORAL
  Filled 2024-04-15 (×3): qty 1

## 2024-04-15 MED ORDER — ENOXAPARIN SODIUM 80 MG/0.8ML IJ SOSY
70.0000 mg | PREFILLED_SYRINGE | Freq: Two times a day (BID) | INTRAMUSCULAR | Status: DC
  Administered 2024-04-15 – 2024-04-18 (×6): 70 mg via SUBCUTANEOUS
  Filled 2024-04-15: qty 0.7
  Filled 2024-04-15 (×3): qty 0.8
  Filled 2024-04-15 (×2): qty 0.7
  Filled 2024-04-15: qty 0.8
  Filled 2024-04-15: qty 0.7

## 2024-04-15 MED ORDER — POLYETHYLENE GLYCOL 3350 17 G PO PACK
17.0000 g | PACK | Freq: Every day | ORAL | Status: DC | PRN
  Administered 2024-04-15: 17 g via ORAL
  Filled 2024-04-15: qty 1

## 2024-04-15 MED ORDER — MAGNESIUM SULFATE (LAXATIVE) PO GRAN: 10.0000 g | GRANULES | Freq: Every day | ORAL | Status: DC | PRN

## 2024-04-15 MED ORDER — CHLORHEXIDINE GLUCONATE CLOTH 2 % EX PADS
6.0000 | MEDICATED_PAD | Freq: Every day | CUTANEOUS | Status: DC
  Administered 2024-04-15 – 2024-04-18 (×4): 6 via TOPICAL

## 2024-04-15 MED ORDER — ASPIRIN 81 MG PO TBEC: 81.0000 mg | DELAYED_RELEASE_TABLET | Freq: Every day | ORAL | Status: DC

## 2024-04-15 MED ORDER — POLYETHYLENE GLYCOL 3350 17 G PO PACK: 17.0000 g | PACK | Freq: Every day | ORAL | Status: DC | PRN

## 2024-04-15 NOTE — Telephone Encounter (Signed)
 Patient Product/process development scientist completed.    The patient is insured through [facility]. Patient has Medicare and is not eligible for a copay card, but may be able to apply for patient assistance or Medicare RX Payment Plan (Patient Must reach out to their plan, if eligible for payment plan), if available.    Ran test claim for Eliquis  Starter Pack and the current 30 day co-pay is $0.00.  Ran test claim for Xarelto Starter Pack and the current 30 day co-pay is $0.00.  This test claim was processed through  [facility]- copay amounts may vary at other pharmacies due to pharmacy/plan contracts, or as the patient moves through the different stages of their insurance plan.     Morgan Arab, CPHT Pharmacy Technician III Certified Patient Advocate [facility] Patient Advocate Team Direct Number: [phone]  Fax: [phone]

## 2024-04-15 NOTE — Progress Notes (Signed)
 PT Cancellation Note  Patient Details Name: Kenneth Roberts MRN: [id number] DOB: 1940-11-16   Cancelled Treatment:    Reason Eval/Treat Not Completed: Medical issues which prohibited therapy;Active bedrest order. 6/12 CTA revealed acute PE. Pt transferred to ICU. Heparin  initiated. Bedrest order in place.   Guadelupe Leech 04/15/2024, 7:30 AM

## 2024-04-15 NOTE — Progress Notes (Signed)
 Seen and examined at bedside. Denies complaints currently.   BP 104/75   Pulse 87   Temp 98.9 F (37.2 C) (Oral)   Resp 19   Ht 5' 11 (1.803 m)   Wt 69.9 kg   SpO2 98%   BMI 21.49 kg/m  Breathing comfortably on RA, R lateral rhales. No tachypnea or accessory muscle use S1S2, RRR No peripheral edema Awake, alert, moving all extremities  LA 1.6 BNP 740, downtrending Trop 53, downtrending   PESI class III Hemodynamically stable.  Agree to walk to monitor for symptoms. Suspect this is acute on chronic as he is tolerating this well and has no signs on lab or physical exam of hypoperfusion.  Full note to follow.  Joesph Mussel, DO 04/15/24 9:58 AM [facility]

## 2024-04-15 NOTE — Progress Notes (Signed)
 NAME:  Kenneth Roberts, MRN:  [id number], DOB:  [birth year], LOS: 2 ADMISSION DATE:  04/13/2024, CONSULTATION DATE:  6/1 REFERRING MD:  Dr. [facility] Lavender, CHIEF COMPLAINT:  Hypotension   History of Present Illness:  83 year old male with past medical history as below, which is significant for chronic diastolic heart failure, hypertension, GERD, and PE no longer on anticoagulation (completed 3 months in 02/2021) who presented to [facility] emergency department with complaints of shortness of breath, productive cough, generalized weakness, and chest tightness x 4 days all of which worsened after a fall.  Trauma evaluation was negative with the exception of superficial laceration which did not need repair.  Imaging demonstrated right-sided pneumonia.  He was admitted to the hospitalist service for treatment with antibiotics.  He was noted to be intermittently hypotensive and had dizziness with ambulation.  Echocardiogram showed RV dilation and mild systolic dysfunction.  CT angiogram then showed acute PE with right heart strain.  RV/LV ratio was 3.  In the evening hours of 6/12 the patient had persistent hypotension and PCCM was asked to evaluate.  Upon my evaluation the patient has no complaints.  He mentions he was previously short of breath and had chest tightness but currently has no complaints and is feeling a lot better.  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   Significant Hospital Events: Including procedures, antibiotic start and stop dates in addition to other pertinent events   6/12 PCCM consult for submassive PE 6/13 IR consult   Interim History / Subjective:    Objective     Blood pressure 122/86, pulse (!) 108, temperature 98.9 F (37.2 C), temperature source Oral, resp. rate (!) 26, height 5' 11 (1.803 m), weight 69.9 kg, SpO2 90%.        Intake/Output Summary (Last 24 hours) at 04/15/2024 1123 Last data filed at 04/15/2024 0800 Gross per 24 hour  Intake 531.11 ml  Output 1075 ml  Net -543.89 ml   Filed Weights   04/14/24 1512 04/15/24 0031  Weight: 70.9 kg 69.9 kg    Examination: General: chronically ill elderly M NAD  HENT: NCAT pink mm poor dentition  Lungs: CTA. Intermittent cough  Cardiovascular: rrr  Abdomen: soft ndnt  Extremities: no asymmetric edema  Neuro: AAOx4   Resolved problem list   Assessment and Plan    Acute PE w cor pulmonale  -at least submassive.   -Syncopal PTA x 4, elevated cardiac biomarkers. Interestingly no hypoxia, not tachycardic. LA normal. Ddimer 2.1. Wonder if this is more AoC / subacute PE?  -RV/LV 3 on CTA and ECHO w dilated RV, mildly reduced fxn.  -Hx prior PE P -d/w IR this morning -- for now not pursuing intervention.  -hep gtt -BLE US  pending -I expect will need lifelong AC   Hypotension, improved  -sepsis 2/2 CAP vs PE  -normal LA  P -ICU monitoring  Sepsis 2/2 CAP  -PCT 3 P - continue ceftriaxone , doxy - Send blood cultures - Sputum pending - pulm hygiene, IS   AKI AGAMA  Hyponatremia, mild  P -trend renal indices UOP  Chronic HFpEF  HX HTN  -holding home meds for now   Best Practice (right click and Reselect all SmartList Selections daily)   Diet/type: Regular consistency (see orders)  DVT  prophylaxis systemic heparin  Pressure ulcer(s): pressure ulcer assessment deferred  GI prophylaxis: N/A Lines: N/A Foley:  N/A Code Status:  full code Last date of multidisciplinary goals of care discussion [ ]   Labs   CBC: Recent Labs  Lab 04/13/24 1734 04/14/24 0320 04/15/24 0538  WBC 9.3 8.5 7.0  NEUTROABS  --  6.1  --   HGB 11.9* 10.6* 11.9*  HCT 34.5* 31.2* 33.6*   MCV 88.5 89.7 87.3  PLT 202 174 171    Basic Metabolic Panel: Recent Labs  Lab 04/13/24 1734 04/13/24 2333 04/14/24 0320 04/15/24 0537  NA 128*  --  129* 131*  K 5.1  --  4.1 4.1  CL 100  --  98 103  CO2 19*  --  17* 19*  GLUCOSE 103*  --  100* 96  BUN 21  --  20 14  CREATININE 1.62*  --  1.43* 1.27*  CALCIUM  9.2  --  8.9 8.6*  MG  --  1.9 1.7  --   PHOS  --   --  3.0  --    GFR: Estimated Creatinine Clearance: 45.1 mL/min (A) (by C-G formula based on SCr of 1.27 mg/dL (H)). Recent Labs  Lab 04/13/24 1734 04/13/24 2333 04/14/24 0320 04/15/24 0538  PROCALCITON  --  2.94  --   --   WBC 9.3  --  8.5 7.0  LATICACIDVEN  --   --   --  1.6    Liver Function Tests: Recent Labs  Lab 04/14/24 0320  AST 15  ALT 11  ALKPHOS 44  BILITOT 1.1  PROT 5.8*  ALBUMIN 3.0*   No results for input(s): LIPASE, AMYLASE in the last 168 hours. No results for input(s): AMMONIA in the last 168 hours.  ABG    Component Value Date/Time   HCO3 19.6 (L) 05/03/2009 0321   TCO2 22 11/07/2014 0407   ACIDBASEDEF 7.0 (H) 05/03/2009 0321   O2SAT 62.0 05/03/2009 0321     Coagulation Profile: No results for input(s): INR, PROTIME in the last 168 hours.  Cardiac Enzymes: No results for input(s): CKTOTAL, CKMB, CKMBINDEX, TROPONINI in the last 168 hours.  HbA1C: Hgb A1c MFr Bld  Date/Time Value Ref Range Status  12/26/2016 09:32 AM 5.5 <5.7 % Final    Comment:      For the purpose of screening for the presence of diabetes:   <5.7%       Consistent with the absence of diabetes 5.7-6.4 %   Consistent with increased risk for diabetes (prediabetes) >=6.5 %     Consistent with diabetes   This assay result is consistent with a decreased risk of diabetes.   Currently, no consensus exists regarding use of hemoglobin A1c for diagnosis of diabetes in children.   According to American Diabetes Association (ADA) guidelines, hemoglobin A1c <7.0% represents optimal  control in non-pregnant diabetic patients. Different metrics may apply to specific patient populations. Standards of Medical Care in Diabetes (ADA).       CBG: Recent Labs  Lab 04/15/24 0027  GLUCAP 89    CCT na    High MDM   Eston Hence MSN, AGACNP-BC [facility] Amion for pager  04/15/2024, 11:23 AM

## 2024-04-15 NOTE — Progress Notes (Signed)
 IR asked to evaluate patient for possible PE thrombectomy. Patient assessed at the bedside this morning and found to have stable vitals signs and no signs of distress at rest. He was breathing comfortably on room air. Biomarkers trending down. Patient ambulated by nursing staff and became dizzy, tachycardic and hypoxic. Imaging and patient status reviewed by Dr. Nereida Banning who is recommending continued conservative management.   CCM agreeable to continue monitoring patient for now and will contact IR if patient declines.  Aviyah Swetz, AGACNP-BC 04/15/2024, 10:49 AM

## 2024-04-15 NOTE — Progress Notes (Signed)
 PHARMACY - ANTICOAGULATION  Pharmacy Consult for Heparin  Indication: pulmonary embolus Brief A/P: Heparin  level supratherapeutic Decrease Heparin  rate   Allergies  Allergen Reactions   Gadolinium Derivatives Other (See Comments)    Unknown reaction   Prilosec [Omeprazole ] Itching    Patient Measurements: Height: 5' 11 (180.3 cm) Weight: 69.9 kg (154 lb 1.6 oz) IBW/kg (Calculated) : 75.3 HEPARIN  DW (KG): 70.9  Vital Signs: Temp: 98.9 F (37.2 C) (06/13 0335) Temp Source: Oral (06/13 0335) BP: 104/78 (06/13 0600) Pulse Rate: 85 (06/13 0600)  Labs: Recent Labs    04/13/24 1734 04/13/24 1934 04/14/24 0320 04/15/24 0537 04/15/24 0538  HGB 11.9*  --  10.6*  --  11.9*  HCT 34.5*  --  31.2*  --  33.6*  PLT 202  --  174  --  171  HEPARINUNFRC  --   --   --  1.10*  --   CREATININE 1.62*  --  1.43* 1.27*  --   TROPONINIHS 82* 80*  --   --   --     Estimated Creatinine Clearance: 45.1 mL/min (A) (by C-G formula based on SCr of 1.27 mg/dL (H)).  Assessment: 83 y.o. male with PE for heparin  Goal of Therapy:  Heparin  level 0.3-0.7 units/ml Monitor platelets by anticoagulation protocol: Yes   Plan:  Decrease Heparin  1050 units/hr .Check heparin  level in 6 hours.   Carlota Chestnut 04/15/2024,6:27 AM

## 2024-04-15 NOTE — Progress Notes (Signed)
 eLink Physician-Brief Progress Note Patient Name: Kenneth Roberts DOB: 07-05-1941 MRN: [id number]   Date of Service  04/15/2024  HPI/Events of Note  Patient admitted with pneumonia and subsequently found to have PE with significant RV strain, transferred to the ICU for close monitoring in the context of borderline blood pressures.  eICU Interventions  New Patient Evaluation.        Taleeyah Bora U Emmery Seiler 04/15/2024, 1:12 AM

## 2024-04-16 DIAGNOSIS — R0789 Other chest pain: Secondary | ICD-10-CM | POA: Diagnosis not present

## 2024-04-16 DIAGNOSIS — E871 Hypo-osmolality and hyponatremia: Secondary | ICD-10-CM | POA: Diagnosis not present

## 2024-04-16 DIAGNOSIS — J189 Pneumonia, unspecified organism: Secondary | ICD-10-CM | POA: Diagnosis not present

## 2024-04-16 DIAGNOSIS — N179 Acute kidney failure, unspecified: Secondary | ICD-10-CM | POA: Diagnosis not present

## 2024-04-16 LAB — BASIC METABOLIC PANEL WITH GFR
Anion gap: 10 (ref 5–15)
BUN: 9 mg/dL (ref 8–23)
CO2: 19 mmol/L — ABNORMAL LOW (ref 22–32)
Calcium: 8.7 mg/dL — ABNORMAL LOW (ref 8.9–10.3)
Chloride: 102 mmol/L (ref 98–111)
Creatinine, Ser: 1.11 mg/dL (ref 0.61–1.24)
GFR, Estimated: >60 mL/min (ref >60)
Glucose, Bld: 101 mg/dL — ABNORMAL HIGH (ref 70–99)
Potassium: 3.7 mmol/L (ref 3.5–5.1)
Sodium: 131 mmol/L — ABNORMAL LOW (ref 135–145)

## 2024-04-16 LAB — CBC
HCT: 29.4 % — ABNORMAL LOW (ref 39.0–52.0)
Hemoglobin: 10.4 g/dL — ABNORMAL LOW (ref 13.0–17.0)
MCH: 30.3 pg (ref 26.0–34.0)
MCHC: 35.4 g/dL (ref 30.0–36.0)
MCV: 85.7 fL (ref 80.0–100.0)
Platelets: 192 10*3/uL (ref 150–400)
RBC: 3.43 MIL/uL — ABNORMAL LOW (ref 4.22–5.81)
RDW: 13.3 % (ref 11.5–15.5)
WBC: 6.5 10*3/uL (ref 4.0–10.5)
nRBC: 0 % (ref 0.0–0.2)

## 2024-04-16 MED ORDER — POLYVINYL ALCOHOL 1.4 % OP SOLN
2.0000 [drp] | OPHTHALMIC | Status: DC | PRN
  Administered 2024-04-16 – 2024-04-17 (×2): 2 [drp] via OPHTHALMIC
  Filled 2024-04-16: qty 15

## 2024-04-16 MED ORDER — GUAIFENESIN-DM 100-10 MG/5ML PO SYRP: 5.0000 mL | ORAL_SOLUTION | ORAL | Status: DC | PRN

## 2024-04-16 MED ORDER — ENSURE PLUS HIGH PROTEIN PO LIQD
237.0000 mL | Freq: Two times a day (BID) | ORAL | Status: DC
  Administered 2024-04-16 – 2024-04-18 (×5): 237 mL via ORAL

## 2024-04-16 MED ORDER — POTASSIUM CHLORIDE CRYS ER 20 MEQ PO TBCR
40.0000 meq | EXTENDED_RELEASE_TABLET | Freq: Once | ORAL | Status: AC
  Administered 2024-04-16: 40 meq via ORAL
  Filled 2024-04-16: qty 2

## 2024-04-16 MED ORDER — SODIUM CHLORIDE 0.9 % IV SOLN: INTRAVENOUS | Status: AC | PRN

## 2024-04-16 NOTE — Progress Notes (Signed)
 PROGRESS NOTE    Kenneth Roberts  ZOX:[id number] DOB: 10/08/1941 DOA: 04/13/2024 PCP: Patient, No Pcp Per   Brief Narrative:  Kenneth Roberts is a 83 y.o. male with medical history significant for chronic diastolic heart failure, essential pretension, GERD, who is admitted to [facility] on 04/13/2024 with community-acquired pneumonia after presenting from home to [facility] ED complaining of shortness of breath with questionable dizziness and near syncope.  Noted submassive PE on echo and CTA with right heart strain.  Transition to ICU overnight for 24 hours to monitor given concern for hypotension and possible need for intervention with IR.  Thankfully given patient's stable nature IR notes no indication for intervention.  Patient symptoms improving, he is hoping to discharge in the next few days pending his symptom improvement.  Assessment & Plan:   Principal Problem:   CAP (community acquired pneumonia) Active Problems:   Essential hypertension   GERD (gastroesophageal reflux disease)   Atypical chest pain   AKI (acute kidney injury) (HCC)   Elevated troponin   Fall at home, initial encounter   Acute hyponatremia   Generalized weakness   Chronic diastolic CHF (congestive heart failure) (HCC)   RVF (right ventricular failure) (HCC)   Acute respiratory failure with hypoxia (HCC)   Acute pulmonary edema (HCC)  Sepsis secondary to community-acquired pneumonia - Sepsis criteria met with tachycardia, tachypnea plus source given pneumonia - Patient not hypoxic although dyspneic even at rest -ambulatory O2 screen pending - Continue broad-spectrum doxycycline  ceftriaxone  for coverage, narrow as cultures become available, plan for 5-day antibiotic course   Acute PE with cor pulmonale, submassive Acute left peroneal DVT -Questionable subacute versus chronic given lack of clinical symptoms, hypoxia, tachycardia with prior history of PE -IR contacted - no indication for intervention at  this time given his improvement - Patient continues without hypoxia or tachycardia, mild dyspnea with exertion, potential transition to p.o. anticoagulation in the next 24 hours  Rule out GI bleed  Patient notes dark stool this morning, unwitnessed by staff - FOBT pending, may delay transition to p.o. anticoagulation  Hypotension multifactorial -Complicated by submassive PE and sepsis -Improving with supportive care  Presyncope/vertigo Ambulatory dysfunction and mechanical fall -Patient reports weakness as well as dizziness and difficulty ambulating -Imaging without obvious overt fracture or dislocation -Baseline ambulates without any assistance or device -PT/OT to follow; orthostatics pending  AKI Hypovolemic hyponatremia, mild - Baseline creatinine 0.8, at intake elevated to 1.6 - Downtrending appropriately, sodium approaching baseline   Pleuritic chest pain, ACS ruled out Minimally elevated troponin, supply/demand mismatch -Appreciate cardiology insight and recommendations - No further indication for further evaluation or imaging at this time given resolution of symptoms   Heart failure, diastolic with preserved ejection fraction EF 60-65% - Currently well compensated, continue to advance diet as tolerated - Restart home meds in the next 24 to 48 hours pending renal recovery   Hypertension -resume home medications once transient hypotension has resolved GERD -resume home meds  DVT prophylaxis: Heparin  drip Code Status:   Code Status: Full Code Family Communication: Niece at bedside  Status is: Inpatient  Dispo: The patient is from: Home              Anticipated d/c is to: Home              Anticipated d/c date is: 24 to 48 hours              Patient currently not medically stable for discharge given need for  ongoing IV anticoagulation  Consultants:  Cardiology, PCCM  Procedures:  None  Antimicrobials:  Ceftriaxone , doxycycline   Subjective: No acute issues or  events overnight denies nausea vomit diarrhea constipation high fevers chills or chest pain  Objective: Vitals:   04/16/24 0351 04/16/24 0400 04/16/24 0600 04/16/24 0755  BP:  103/78 100/77   Pulse:  81 80   Resp:  14 12   Temp: 98.1 F (36.7 C)   97.9 F (36.6 C)  TempSrc: Oral   Axillary  SpO2:  99% 98%   Weight:      Height:        Intake/Output Summary (Last 24 hours) at 04/16/2024 0801 Last data filed at 04/16/2024 0300 Gross per 24 hour  Intake 740.77 ml  Output 655 ml  Net 85.77 ml   Filed Weights   04/14/24 1512 04/15/24 0031  Weight: 70.9 kg 69.9 kg    Examination:  General: Pleasantly resting in bed, No acute distress. HEENT: Normocephalic atraumatic.  Sclerae nonicteric, noninjected.  Extraocular movements intact bilaterally. Neck: Without mass or deformity.  Trachea is midline. Lungs: Coarse breath sounds bilaterally without rales Heart: Regular rate and rhythm.  Without murmurs, rubs, or gallops. Abdomen: Soft, nontender, nondistended.  Without guarding or rebound. Extremities: Without cyanosis, clubbing, edema, or obvious deformity. Skin: Warm and dry, no erythema.  Data Reviewed: I have personally reviewed following labs and imaging studies  CBC: Recent Labs  Lab 04/13/24 1734 04/14/24 0320 04/15/24 0538 04/16/24 0355  WBC 9.3 8.5 7.0 6.5  NEUTROABS  --  6.1  --   --   HGB 11.9* 10.6* 11.9* 10.4*  HCT 34.5* 31.2* 33.6* 29.4*  MCV 88.5 89.7 87.3 85.7  PLT 202 174 171 192   Basic Metabolic Panel: Recent Labs  Lab 04/13/24 1734 04/13/24 2333 04/14/24 0320 04/15/24 0537 04/16/24 0355  NA 128*  --  129* 131* 131*  K 5.1  --  4.1 4.1 3.7  CL 100  --  98 103 102  CO2 19*  --  17* 19* 19*  GLUCOSE 103*  --  100* 96 101*  BUN 21  --  20 14 9   CREATININE 1.62*  --  1.43* 1.27* 1.11  CALCIUM  9.2  --  8.9 8.6* 8.7*  MG  --  1.9 1.7  --   --   PHOS  --   --  3.0  --   --    GFR: Estimated Creatinine Clearance: 51.6 mL/min (by C-G formula  based on SCr of 1.11 mg/dL). Liver Function Tests: Recent Labs  Lab 04/14/24 0320  AST 15  ALT 11  ALKPHOS 44  BILITOT 1.1  PROT 5.8*  ALBUMIN 3.0*   CBG: Recent Labs  Lab 04/15/24 0027  GLUCAP 89   Thyroid  Function Tests: Recent Labs    04/14/24 0320  TSH 2.007   Sepsis Labs: Recent Labs  Lab 04/13/24 2333 04/15/24 0538  PROCALCITON 2.94  --   LATICACIDVEN  --  1.6    Recent Results (from the past 240 hours)  MRSA Next Gen by PCR, Nasal     Status: None   Collection Time: 04/15/24 12:18 AM   Specimen: Nasal Mucosa; Nasal Swab  Result Value Ref Range Status   MRSA by PCR Next Gen NOT DETECTED NOT DETECTED Final    Comment: (NOTE) The GeneXpert MRSA Assay (FDA approved for NASAL specimens only), is one component of a comprehensive MRSA colonization surveillance program. It is not intended to diagnose MRSA infection nor  to guide or monitor treatment for MRSA infections. Test performance is not FDA approved in patients less than 89 years old. Performed at [facility], [address] [place], [place] [address]    Radiology Studies: VAS US  LOWER EXTREMITY VENOUS (DVT) Result Date: 04/15/2024  Lower Venous DVT Study Patient Name:  KREED KAUFFMAN  Date of Exam:   04/15/2024 Medical Rec #: [id number]        Accession #:    [id number] Date of Birth: 02-Jul-1941        Patient Gender: M Patient Age:   20 years Exam Location:  [facility] Procedure:      VAS US  LOWER EXTREMITY VENOUS (DVT) Referring Phys: Roz Cornelia --------------------------------------------------------------------------------  Indications: Pulmonary embolism.  Risk Factors: Hx of PE (2022). Comparison Study: Previous exam on 11/07/2020 was negative for DVT Performing Technologist: Arlyce Berger RVT, RDMS  Examination Guidelines: A complete evaluation includes B-mode imaging, spectral Doppler, color Doppler, and power Doppler as needed of all accessible portions of each vessel. Bilateral testing is  considered an integral part of a complete examination. Limited examinations for reoccurring indications may be performed as noted. The reflux portion of the exam is performed with the patient in reverse Trendelenburg.  +---------+---------------+---------+-----------+----------+--------------+ RIGHT    CompressibilityPhasicitySpontaneityPropertiesThrombus Aging +---------+---------------+---------+-----------+----------+--------------+ CFV      Full           No       Yes        pulsatile                +---------+---------------+---------+-----------+----------+--------------+ SFJ      Full                                                        +---------+---------------+---------+-----------+----------+--------------+ FV Prox  Full           No       Yes        pulsatile                +---------+---------------+---------+-----------+----------+--------------+ FV Mid   Full           No       Yes        pulsatile                +---------+---------------+---------+-----------+----------+--------------+ FV DistalFull           No       Yes        pulsatile                +---------+---------------+---------+-----------+----------+--------------+ PFV      Full                                                        +---------+---------------+---------+-----------+----------+--------------+ POP      Full           No       Yes        pulsatile                +---------+---------------+---------+-----------+----------+--------------+ PTV      Full                                                        +---------+---------------+---------+-----------+----------+--------------+  PERO     Full                                                        +---------+---------------+---------+-----------+----------+--------------+   +---------+---------------+---------+-----------+----------+--------------+ LEFT      CompressibilityPhasicitySpontaneityPropertiesThrombus Aging +---------+---------------+---------+-----------+----------+--------------+ CFV      Full           No       Yes        pulsatile                +---------+---------------+---------+-----------+----------+--------------+ SFJ      Full                                                        +---------+---------------+---------+-----------+----------+--------------+ FV Prox  Full           No       Yes        pulsatile                +---------+---------------+---------+-----------+----------+--------------+ FV Mid   Full           No       Yes        pulsatile                +---------+---------------+---------+-----------+----------+--------------+ FV DistalFull           No       Yes        pulsatile                +---------+---------------+---------+-----------+----------+--------------+ PFV      Full                                                        +---------+---------------+---------+-----------+----------+--------------+ POP      Full           No       Yes        pulsatile                +---------+---------------+---------+-----------+----------+--------------+ PTV      Full                                                        +---------+---------------+---------+-----------+----------+--------------+ PERO     None           No       No                   Acute          +---------+---------------+---------+-----------+----------+--------------+     Summary: BILATERAL: -No evidence of popliteal cyst, bilaterally. RIGHT: - There is no evidence of deep vein thrombosis in the lower extremity.  LEFT: - Findings consistent with acute deep vein thrombosis involving the left peroneal veins.   *See table(s) above for measurements and  observations.    Preliminary    CT Angio Chest Pulmonary Embolism (PE) W or WO Contrast Result Date: 04/14/2024 CLINICAL DATA:  PE suspected.  Chest  pain. EXAM: CT ANGIOGRAPHY CHEST WITH CONTRAST TECHNIQUE: Multidetector CT imaging of the chest was performed using the standard protocol during bolus administration of intravenous contrast. Multiplanar CT image reconstructions and MIPs were obtained to evaluate the vascular anatomy. RADIATION DOSE REDUCTION: This exam was performed according to the departmental dose-optimization program which includes automated exposure control, adjustment of the mA and/or kV according to patient size and/or use of iterative reconstruction technique. CONTRAST:  75mL OMNIPAQUE  IOHEXOL  350 MG/ML SOLN COMPARISON:  Chest radiograph 04/14/2019 and CT 10/26/2021 FINDINGS: Cardiovascular: Acute pulmonary embolism at the bifurcation of the right pulmonary artery extending into multiple lobar and segmental branches throughout the right lung. Additional segmental subsegmental pulmonary emboli in the left lower lobe. There is evidence of right heart strain with RV/LV ratio 3. Marked compression of the left ventricle. Small pericardial effusion. Normal caliber thoracic aorta. Mild aortic atherosclerotic calcification. Mediastinum/Nodes: Trachea and esophagus are unremarkable. No thoracic adenopathy Lungs/Pleura: Diffuse bronchial wall thickening. Mucous plugs and bronchiectasis/bronchiolectasis in the bilateral lower lobes and right middle lobe. Peribronchovascular consolidation and ground-glass opacities greatest in the right lower and middle lobes. Some of the consolidation has a nodular appearance (for example series 6/image 110) measuring 17 x 22 mm. Additional centrilobular and tree-in-bud nodules greatest in the right lower and middle lobes. Trace bilateral pleural effusions. No pneumothorax. Upper Abdomen: No acute abnormality. Musculoskeletal: No acute fracture. Review of the MIP images confirms the above findings. IMPRESSION: 1. Positive for acute PE with CT evidence of right heart strain (RV/LV Ratio = 3) consistent with at least  submassive (intermediate risk) PE. The presence of right heart strain has been associated with an increased risk of morbidity and mortality. Please refer to the Code PE Focused order set in EPIC. 2. Pulmonary findings compatible with bronchopneumonia. Follow-up in 8 weeks after treatment is recommended ensure resolution exclude underlying mass. 3. Trace bilateral pleural effusions. 4. Aortic Atherosclerosis (ICD10-I70.0). Critical Value/emergent results were called by telephone at the time of interpretation on 04/14/2024 at 7:23 pm to provider Dr. [facility] Lavender, who verbally acknowledged these results. Electronically Signed   By: Rozell Cornet M.D.   On: 04/14/2024 19:23   ECHOCARDIOGRAM COMPLETE Result Date: 04/14/2024    ECHOCARDIOGRAM REPORT   Patient Name:   Kenneth Roberts Date of Exam: 04/14/2024 Medical Rec #:  [id number]       Height:       70.0 in Accession #:    [id number]      Weight:       186.0 lb Date of Birth:  [birth year]       BSA:          2.024 m Patient Age:    83 years        BP:           107/89 mmHg Patient Gender: M               HR:           98 bpm. Exam Location:  Inpatient Procedure: 2D Echo, Color Doppler and Cardiac Doppler (Both Spectral and Color            Flow Doppler were utilized during procedure). Indications:    Elevated Troponins R79.89  History:        Patient has prior history of Echocardiogram examinations, most  recent 11/07/2020. CHF; Risk Factors:Hypertension, Dyslipidemia                 and Sleep Apnea.  Sonographer:    Sherline Distel Senior RDCS Referring Phys: [id number] Roxana Copier  Sonographer Comments: Technically difficult due to lung interference, active pneumonia at time of study IMPRESSIONS  1. Left ventricular ejection fraction, by estimation, is 65 to 70%. The left ventricle has normal function. The left ventricle has no regional wall motion abnormalities. There is mild left ventricular hypertrophy. Left ventricular diastolic parameters were normal.   2. Right ventricular systolic function is mildly reduced. The right ventricular size is severely enlarged. Tricuspid regurgitation signal is inadequate for assessing PA pressure.  3. Right atrial size was moderately dilated.  4. A small pericardial effusion is present. The pericardial effusion is localized near the right ventricle.  5. The mitral valve is normal in structure. No evidence of mitral valve regurgitation. No evidence of mitral stenosis.  6. The aortic valve is grossly normal. There is mild calcification of the aortic valve. Aortic valve regurgitation is not visualized. Aortic valve sclerosis is present, with no evidence of aortic valve stenosis.  7. The inferior vena cava is dilated in size with <50% respiratory variability, suggesting right atrial pressure of 15 mmHg. Comparison(s): Changes from prior study are noted. Conclusion(s)/Recommendation(s): Normal to hyperdynamic LV, but RV is severely enlarged with mildly reduced function. Small local pericardial effusion. Findings concerning for right heart strain. Communicated to Dr. Rudine Cos and Dr. Alroy Aspen. FINDINGS  Left Ventricle: Left ventricular ejection fraction, by estimation, is 65 to 70%. The left ventricle has normal function. The left ventricle has no regional wall motion abnormalities. The left ventricular internal cavity size was normal in size. There is  mild left ventricular hypertrophy. Left ventricular diastolic parameters were normal. Right Ventricle: The right ventricular size is severely enlarged. Right vetricular wall thickness was not well visualized. Right ventricular systolic function is mildly reduced. Tricuspid regurgitation signal is inadequate for assessing PA pressure. Left Atrium: Left atrial size was normal in size. Right Atrium: Right atrial size was moderately dilated. Pericardium: A small pericardial effusion is present. The pericardial effusion is localized near the right ventricle. Mitral Valve: The mitral valve is  normal in structure. No evidence of mitral valve regurgitation. No evidence of mitral valve stenosis. Tricuspid Valve: The tricuspid valve is grossly normal. Tricuspid valve regurgitation is trivial. No evidence of tricuspid stenosis. Aortic Valve: The aortic valve is grossly normal. There is mild calcification of the aortic valve. Aortic valve regurgitation is not visualized. Aortic valve sclerosis is present, with no evidence of aortic valve stenosis. Pulmonic Valve: The pulmonic valve was not well visualized. Pulmonic valve regurgitation is not visualized. No evidence of pulmonic stenosis. Aorta: The aortic root and ascending aorta are structurally normal, with no evidence of dilitation. Venous: The inferior vena cava is dilated in size with less than 50% respiratory variability, suggesting right atrial pressure of 15 mmHg. IAS/Shunts: The interatrial septum was not well visualized.  LEFT VENTRICLE PLAX 2D LVIDd:         2.90 cm   Diastology LVIDs:         2.10 cm   LV e' medial:    6.31 cm/s LV PW:         1.00 cm   LV E/e' medial:  5.9 LV IVS:        1.00 cm   LV e' lateral:   8.92 cm/s LVOT diam:  1.90 cm   LV E/e' lateral: 4.2 LV SV:         45 LV SV Index:   22 LVOT Area:     2.84 cm  RIGHT VENTRICLE RV Basal diam:  5.40 cm RV S prime:     9.90 cm/s TAPSE (M-mode): 1.5 cm LEFT ATRIUM             Index        RIGHT ATRIUM           Index LA diam:        2.60 cm 1.28 cm/m   RA Area:     18.90 cm LA Vol (A2C):   26.7 ml 13.19 ml/m  RA Volume:   60.40 ml  29.84 ml/m LA Vol (A4C):   18.8 ml 9.29 ml/m LA Biplane Vol: 22.4 ml 11.07 ml/m  AORTIC VALVE LVOT Vmax:   78.20 cm/s LVOT Vmean:  62.800 cm/s LVOT VTI:    0.160 m  AORTA Ao Root diam: 3.20 cm Ao Asc diam:  3.50 cm MITRAL VALVE MV Area (PHT): 3.21 cm    SHUNTS MV Decel Time: 236 msec    Systemic VTI:  0.16 m MV E velocity: 37.20 cm/s  Systemic Diam: 1.90 cm MV A velocity: 77.60 cm/s MV E/A ratio:  0.48 Sheryle Donning MD Electronically signed  by Sheryle Donning MD Signature Date/Time: 04/14/2024/12:37:05 PM    Final    Scheduled Meds:  aspirin  EC  81 mg Oral Daily   Chlorhexidine  Gluconate Cloth  6 each Topical Daily   doxycycline   100 mg Oral Q12H   enoxaparin  (LOVENOX ) injection  70 mg Subcutaneous Q12H   guaiFENesin   600 mg Oral BID   pantoprazole   40 mg Oral Daily   Continuous Infusions:  cefTRIAXone  (ROCEPHIN )  IV Stopped (04/15/24 2141)     LOS: 3 days   Time spent:  Haydee Lipa, DO [facility]  If 7PM-7AM, please contact night-coverage [email or web address]  04/16/2024, 8:01 AM

## 2024-04-16 NOTE — Progress Notes (Signed)
 Physical Therapy Treatment Patient Details Name: Kenneth Roberts MRN: [id number] DOB: 12-18-1940 Today's Date: 04/16/2024   History of Present Illness Pt is an 83 yo male admitted to [facility] with c/o SOB found to have CAP. Fell 2-3 days PTA due to tripping; elevated troponins (per Cardiology not ACS--likely pleurisy); Pt found to have acute PE with rt heart strain on evening of 6/12 and anticoagulation started. PMH - HTN, peripheral neuropathy.    PT Comments  Pt no longer with any dizziness and much improved mobility. Recommend return home with HHPT.     If plan is discharge home, recommend the following: Assistance with cooking/housework;Assist for transportation;Help with stairs or ramp for entrance   Can travel by private vehicle     Yes  Equipment Recommendations  None recommended by PT    Recommendations for Other Services       Precautions / Restrictions Precautions Precautions: Fall Recall of Precautions/Restrictions: Intact     Mobility  Bed Mobility Overal bed mobility: Modified Independent Bed Mobility: Supine to Sit, Sit to Supine     Supine to sit: Modified independent (Device/Increase time), HOB elevated Sit to supine: Modified independent (Device/Increase time)        Transfers Overall transfer level: Needs assistance Equipment used: None Transfers: Sit to/from Stand Sit to Stand: Supervision           General transfer comment: for safety    Ambulation/Gait Ambulation/Gait assistance: Supervision Gait Distance (Feet): 150 Feet Assistive device: None Gait Pattern/deviations: Step-through pattern, Decreased stride length Gait velocity: decr Gait velocity interpretation: 1.31 - 2.62 ft/sec, indicative of limited community ambulator   General Gait Details: supervision for safety. Slightly unsteady but no loss of balance   Stairs             Wheelchair Mobility     Tilt Bed    Modified Rankin (Stroke Patients Only)       Balance  Overall balance assessment: Needs assistance Sitting-balance support: No upper extremity supported, Feet supported Sitting balance-Leahy Scale: Normal     Standing balance support: During functional activity, No upper extremity supported Standing balance-Leahy Scale: Good                              Communication Communication Communication: No apparent difficulties  Cognition Arousal: Alert Behavior During Therapy: WFL for tasks assessed/performed   PT - Cognitive impairments: No apparent impairments                         Following commands: Intact      Cueing Cueing Techniques: Verbal cues  Exercises      General Comments General comments (skin integrity, edema, etc.): VSS on RA      Pertinent Vitals/Pain Pain Assessment Pain Assessment: No/denies pain    Home Living                          Prior Function            PT Goals (current goals can now be found in the care plan section) Progress towards PT goals: Progressing toward goals    Frequency    Min 2X/week      PT Plan      Co-evaluation              AM-PAC PT 6 Clicks Mobility   Outcome Measure  Help needed turning  from your back to your side while in a flat bed without using bedrails?: None Help needed moving from lying on your back to sitting on the side of a flat bed without using bedrails?: None Help needed moving to and from a bed to a chair (including a wheelchair)?: A Little Help needed standing up from a chair using your arms (e.g., wheelchair or bedside chair)?: A Little Help needed to walk in hospital room?: A Little Help needed climbing 3-5 steps with a railing? : A Little 6 Click Score: 20    End of Session   Activity Tolerance: Patient tolerated treatment well Patient left: in bed;with call bell/phone within reach;with bed alarm set Nurse Communication: Mobility status PT Visit Diagnosis: Unsteadiness on feet (R26.81)     Time:  2956-2130 PT Time Calculation (min) (ACUTE ONLY): 11 min  Charges:    $Gait Training: 8-22 mins PT General Charges $$ ACUTE PT VISIT: 1 Visit                     [facility] PT Acute Rehabilitation Services Office [phone]    Pura Browns [facility] 04/16/2024, 2:51 PM

## 2024-04-16 NOTE — Plan of Care (Signed)
  Problem: Skin Integrity: Goal: Risk for impaired skin integrity will decrease Outcome: Progressing   Problem: Safety: Goal: Ability to remain free from injury will improve Outcome: Progressing   Problem: Elimination: Goal: Will not experience complications related to bowel motility Outcome: Progressing   Problem: Nutrition: Goal: Adequate nutrition will be maintained Outcome: Progressing   

## 2024-04-16 NOTE — Progress Notes (Signed)
 [facility] ADULT ICU REPLACEMENT PROTOCOL   The patient does apply for the [facility] Adult ICU Electrolyte Replacment Protocol based on the criteria listed below:   1.Exclusion criteria: TCTS, ECMO, Dialysis, and Myasthenia Gravis patients 2. Is GFR >/= 30 ml/min? Yes.    Patient's GFR today is >60 3. Is SCr </= 2? Yes.   Patient's SCr is 1.11 mg/dL 4. Did SCr increase >/= 0.5 in 24 hours? No. 5.Pt's weight >40kg  Yes.   6. Abnormal electrolyte(s):   K 3.7  7. Electrolytes replaced per protocol 8.  Call MD STAT for K+ </= 2.5, Phos </= 1, or Mag </= 1 Physician:  Derrek Flicker R Anyely Cunning 04/16/2024 5:54 AM

## 2024-04-17 DIAGNOSIS — R0789 Other chest pain: Secondary | ICD-10-CM | POA: Diagnosis not present

## 2024-04-17 DIAGNOSIS — N179 Acute kidney failure, unspecified: Secondary | ICD-10-CM | POA: Diagnosis not present

## 2024-04-17 DIAGNOSIS — E871 Hypo-osmolality and hyponatremia: Secondary | ICD-10-CM | POA: Diagnosis not present

## 2024-04-17 DIAGNOSIS — J189 Pneumonia, unspecified organism: Secondary | ICD-10-CM | POA: Diagnosis not present

## 2024-04-17 LAB — OCCULT BLOOD X 1 CARD TO LAB, STOOL
Fecal Occult Bld: POSITIVE — AB
Fecal Occult Bld: POSITIVE — AB
Fecal Occult Bld: POSITIVE — AB

## 2024-04-17 LAB — TYPE AND SCREEN
ABO/RH(D): A NEG
Antibody Screen: NEGATIVE

## 2024-04-17 LAB — CBC
HCT: 26.3 % — ABNORMAL LOW (ref 39.0–52.0)
Hemoglobin: 9.4 g/dL — ABNORMAL LOW (ref 13.0–17.0)
MCH: 30.8 pg (ref 26.0–34.0)
MCHC: 35.7 g/dL (ref 30.0–36.0)
MCV: 86.2 fL (ref 80.0–100.0)
Platelets: 207 10*3/uL (ref 150–400)
RBC: 3.05 MIL/uL — ABNORMAL LOW (ref 4.22–5.81)
RDW: 13.2 % (ref 11.5–15.5)
WBC: 4.1 10*3/uL (ref 4.0–10.5)
nRBC: 0 % (ref 0.0–0.2)

## 2024-04-17 LAB — BASIC METABOLIC PANEL WITH GFR
Anion gap: 6 (ref 5–15)
BUN: 10 mg/dL (ref 8–23)
CO2: 21 mmol/L — ABNORMAL LOW (ref 22–32)
Calcium: 8.5 mg/dL — ABNORMAL LOW (ref 8.9–10.3)
Chloride: 103 mmol/L (ref 98–111)
Creatinine, Ser: 1.22 mg/dL (ref 0.61–1.24)
GFR, Estimated: 60 mL/min — ABNORMAL LOW (ref >60)
Glucose, Bld: 109 mg/dL — ABNORMAL HIGH (ref 70–99)
Potassium: 3.7 mmol/L (ref 3.5–5.1)
Sodium: 130 mmol/L — ABNORMAL LOW (ref 135–145)

## 2024-04-17 LAB — ABO/RH: ABO/RH(D): A NEG

## 2024-04-17 LAB — MAGNESIUM: Magnesium: 1.4 mg/dL — ABNORMAL LOW (ref 1.7–2.4)

## 2024-04-17 LAB — HEMOGLOBIN AND HEMATOCRIT, BLOOD
HCT: 26.5 % — ABNORMAL LOW (ref 39.0–52.0)
Hemoglobin: 9.2 g/dL — ABNORMAL LOW (ref 13.0–17.0)

## 2024-04-17 LAB — PHOSPHORUS: Phosphorus: 3.2 mg/dL (ref 2.5–4.6)

## 2024-04-17 MED ORDER — TRAZODONE HCL 100 MG PO TABS
100.0000 mg | ORAL_TABLET | Freq: Every day | ORAL | Status: DC
  Administered 2024-04-17: 100 mg via ORAL
  Filled 2024-04-17: qty 1

## 2024-04-17 MED ORDER — SODIUM CHLORIDE 0.9 % IV SOLN: INTRAVENOUS | Status: DC | PRN

## 2024-04-17 MED ORDER — TRAZODONE HCL 50 MG PO TABS
50.0000 mg | ORAL_TABLET | Freq: Once | ORAL | Status: AC
  Administered 2024-04-17: 50 mg via ORAL
  Filled 2024-04-17: qty 1

## 2024-04-17 NOTE — Plan of Care (Signed)

## 2024-04-17 NOTE — Progress Notes (Signed)
 Mobility Specialist Progress Note;   04/17/24 0952  Mobility  Activity Ambulated with assistance in hallway  Level of Assistance Contact guard assist, steadying assist  Assistive Device None  Distance Ambulated (ft) 150 ft  Activity Response Tolerated well  Mobility Referral Yes  Mobility visit 1 Mobility  Mobility Specialist Start Time (ACUTE ONLY) [id number]  Mobility Specialist Stop Time (ACUTE ONLY) 1000  Mobility Specialist Time Calculation (min) (ACUTE ONLY) 8 min   Pt agreeable to mobility. Required light MinG assistance during ambulation for safety. No c/o of dizziness when asked. VSS throughout. Pt returned back to bed with all needs met, call bell in reach.   Janit Meline Mobility Specialist Please contact via SecureChat or [employer] [phone]

## 2024-04-17 NOTE — Progress Notes (Signed)
 [facility] night cross cover note:   I was notified by the patient's RN  that patient's fecal occult blood test is positive.  He has had 2 additional episodes of loose dark-colored stool overnight, in the absence of any overt hematochezia.   Updated hemoglobin level this morning is 9.4, relative to yesterday morning's value of 10.4 and 11.9 on 6/13.  VS appear stable, including HR's in the 70s to 80s, systolic blood pressures in the low 100s mmHg with diastolic blood pressures in the 70s to 80s, with most recent blood pressure noted to be 108/82.  Respiratory rate 16, and oxygen saturation 100% on room air.  In the setting of his submassive pulmonary embolism, will continue his existing full dose Lovenox , but I have ordered repeat hemoglobin level to be checked around 10 AM this morning, and have also placed order for type and screen.    Camelia Cavalier, DO Hospitalist

## 2024-04-17 NOTE — Progress Notes (Addendum)
 1st of 3 occult blood stool (+). Howerter, MD notified. Pt had 2 episodes of loose dark stool last night. Hgb 9.4 this AM down from 10.4 (6/14) and 11.9 (6/13).

## 2024-04-17 NOTE — Progress Notes (Signed)
 PROGRESS NOTE    Kenneth Roberts  ZOX:[id number] DOB: [birth year] DOA: 04/13/2024 PCP: Patient, No Pcp Per   Brief Narrative:  Kenneth Roberts is a 83 y.o. male with medical history significant for chronic diastolic heart failure, essential pretension, GERD, who is admitted to [facility] on 04/13/2024 with community-acquired pneumonia after presenting from home to [facility] ED complaining of shortness of breath with questionable dizziness and near syncope.  Noted submassive PE on echo and CTA with right heart strain.  Transition to ICU overnight for 24 hours to monitor given concern for hypotension and possible need for intervention with IR.  Thankfully given patient's stable nature IR notes no indication for intervention.  Patient symptoms improving, he is hoping to discharge in the next few days pending his symptom improvement.  Assessment & Plan:   Principal Problem:   CAP (community acquired pneumonia) Active Problems:   Essential hypertension   GERD (gastroesophageal reflux disease)   Atypical chest pain   AKI (acute kidney injury) (HCC)   Elevated troponin   Fall at home, initial encounter   Acute hyponatremia   Generalized weakness   Chronic diastolic CHF (congestive heart failure) (HCC)   RVF (right ventricular failure) (HCC)   Acute respiratory failure with hypoxia (HCC)   Acute pulmonary edema (HCC)  Acute worsening anemia, likely blood loss Rule out GI bleed - FOBT+ with Hgb drop over the last 48h (11.9-10.4-9.4) - If Hgb continues to downtrend will discuss case with GI - he is too high risk to DC anticoagulation at this time  Sepsis secondary to community-acquired pneumonia - Sepsis criteria met with tachycardia, tachypnea plus source given pneumonia - Patient not hypoxic although dyspneic even at rest -ambulatory O2 screen pending - Continue broad-spectrum doxycycline  ceftriaxone  for coverage, narrow as cultures become available, plan for 5-day antibiotic course    Acute PE with cor pulmonale, submassive Acute left peroneal DVT -Questionable subacute versus chronic given lack of clinical symptoms, hypoxia, tachycardia with prior history of PE -IR contacted - no indication for intervention at this time given his improvement - Patient continues without hypoxia or tachycardia, mild dyspnea with exertion, potential transition to p.o. anticoagulation in the next 24 hours   Hypotension multifactorial -Complicated by submassive PE and sepsis -Improving with supportive care  Presyncope/vertigo Ambulatory dysfunction and mechanical fall -Patient reports weakness as well as dizziness and difficulty ambulating -Imaging without obvious overt fracture or dislocation -Baseline ambulates without any assistance or device -PT/OT to follow; orthostatics pending  AKI Hypovolemic hyponatremia, mild - Baseline creatinine 0.8, at intake elevated to 1.6 - Downtrending appropriately, sodium approaching baseline   Pleuritic chest pain, ACS ruled out Minimally elevated troponin, supply/demand mismatch -Appreciate cardiology insight and recommendations - No further indication for further evaluation or imaging at this time given resolution of symptoms   Heart failure, diastolic with preserved ejection fraction EF 60-65% - Currently well compensated, continue to advance diet as tolerated - Restart home meds in the next 24 to 48 hours pending renal recovery   Hypertension -resume home medications once transient hypotension has resolved GERD -resume home meds  DVT prophylaxis: Heparin  drip Code Status:   Code Status: Full Code Family Communication: Niece at bedside  Status is: Inpatient  Dispo: The patient is from: Home              Anticipated d/c is to: Home              Anticipated d/c date is: 24 to 48 hours  Patient currently not medically stable for discharge given need for ongoing IV anticoagulation  Consultants:  Cardiology,  PCCM  Procedures:  None  Antimicrobials:  Ceftriaxone , doxycycline   Subjective: No acute issues or events overnight denies nausea vomit diarrhea constipation high fevers chills or chest pain  Objective: Vitals:   04/16/24 1931 04/17/24 0020 04/17/24 0506 04/17/24 0743  BP: 124/75 106/73 108/82 103/76  Pulse: 74 72 85   Resp: 18 17 16 18   Temp: 98.2 F (36.8 C) (!) 97.3 F (36.3 C) (!) 97.5 F (36.4 C) 98.1 F (36.7 C)  TempSrc: Oral Oral Oral Oral  SpO2: 100% 100% 100%   Weight:   71.5 kg   Height:        Intake/Output Summary (Last 24 hours) at 04/17/2024 0802 Last data filed at 04/17/2024 0509 Gross per 24 hour  Intake 1260 ml  Output 575 ml  Net 685 ml   Filed Weights   04/14/24 1512 04/15/24 0031 04/17/24 0506  Weight: 70.9 kg 69.9 kg 71.5 kg    Examination:  General: Pleasantly resting in bed, No acute distress. HEENT: Normocephalic atraumatic.  Sclerae nonicteric, noninjected.  Extraocular movements intact bilaterally. Neck: Without mass or deformity.  Trachea is midline. Lungs: Coarse breath sounds bilaterally without rales Heart: Regular rate and rhythm.  Without murmurs, rubs, or gallops. Abdomen: Soft, nontender, nondistended.  Without guarding or rebound. Extremities: Without cyanosis, clubbing, edema, or obvious deformity. Skin: Warm and dry, no erythema.  Data Reviewed: I have personally reviewed following labs and imaging studies  CBC: Recent Labs  Lab 04/13/24 1734 04/14/24 0320 04/15/24 0538 04/16/24 0355 04/17/24 0549  WBC 9.3 8.5 7.0 6.5 4.1  NEUTROABS  --  6.1  --   --   --   HGB 11.9* 10.6* 11.9* 10.4* 9.4*  HCT 34.5* 31.2* 33.6* 29.4* 26.3*  MCV 88.5 89.7 87.3 85.7 86.2  PLT 202 174 171 192 207   Basic Metabolic Panel: Recent Labs  Lab 04/13/24 1734 04/13/24 2333 04/14/24 0320 04/15/24 0537 04/16/24 0355 04/17/24 0549  NA 128*  --  129* 131* 131* 130*  K 5.1  --  4.1 4.1 3.7 3.7  CL 100  --  98 103 102 103  CO2 19*  --   17* 19* 19* 21*  GLUCOSE 103*  --  100* 96 101* 109*  BUN 21  --  20 14 9 10   CREATININE 1.62*  --  1.43* 1.27* 1.11 1.22  CALCIUM  9.2  --  8.9 8.6* 8.7* 8.5*  MG  --  1.9 1.7  --   --  1.4*  PHOS  --   --  3.0  --   --  3.2   GFR: Estimated Creatinine Clearance: 48 mL/min (by C-G formula based on SCr of 1.22 mg/dL). Liver Function Tests: Recent Labs  Lab 04/14/24 0320  AST 15  ALT 11  ALKPHOS 44  BILITOT 1.1  PROT 5.8*  ALBUMIN 3.0*   CBG: Recent Labs  Lab 04/15/24 0027  GLUCAP 89   Thyroid  Function Tests: No results for input(s): TSH, T4TOTAL, FREET4, T3FREE, THYROIDAB in the last 72 hours.  Sepsis Labs: Recent Labs  Lab 04/13/24 2333 04/15/24 0538  PROCALCITON 2.94  --   LATICACIDVEN  --  1.6    Recent Results (from the past 240 hours)  MRSA Next Gen by PCR, Nasal     Status: None   Collection Time: 04/15/24 12:18 AM   Specimen: Nasal Mucosa; Nasal Swab  Result Value Ref  Range Status   MRSA by PCR Next Gen NOT DETECTED NOT DETECTED Final    Comment: (NOTE) The GeneXpert MRSA Assay (FDA approved for NASAL specimens only), is one component of a comprehensive MRSA colonization surveillance program. It is not intended to diagnose MRSA infection nor to guide or monitor treatment for MRSA infections. Test performance is not FDA approved in patients less than 4 years old. Performed at [facility], [address] [place], [place] [address]    Radiology Studies: VAS US  LOWER EXTREMITY VENOUS (DVT) Result Date: 04/16/2024  Lower Venous DVT Study Patient Name:  Kenneth Roberts  Date of Exam:   04/15/2024 Medical Rec #: [id number]        Accession #:    [id number] Date of Birth: 11/19/1940        Patient Gender: M Patient Age:   32 years Exam Location:  [facility] Procedure:      VAS US  LOWER EXTREMITY VENOUS (DVT) Referring Phys: Roz Cornelia --------------------------------------------------------------------------------  Indications:  Pulmonary embolism.  Risk Factors: Hx of PE (2022). Comparison Study: Previous exam on 11/07/2020 was negative for DVT Performing Technologist: Arlyce Berger RVT, RDMS  Examination Guidelines: A complete evaluation includes B-mode imaging, spectral Doppler, color Doppler, and power Doppler as needed of all accessible portions of each vessel. Bilateral testing is considered an integral part of a complete examination. Limited examinations for reoccurring indications may be performed as noted. The reflux portion of the exam is performed with the patient in reverse Trendelenburg.  +---------+---------------+---------+-----------+----------+--------------+ RIGHT    CompressibilityPhasicitySpontaneityPropertiesThrombus Aging +---------+---------------+---------+-----------+----------+--------------+ CFV      Full           No       Yes        pulsatile                +---------+---------------+---------+-----------+----------+--------------+ SFJ      Full                                                        +---------+---------------+---------+-----------+----------+--------------+ FV Prox  Full           No       Yes        pulsatile                +---------+---------------+---------+-----------+----------+--------------+ FV Mid   Full           No       Yes        pulsatile                +---------+---------------+---------+-----------+----------+--------------+ FV DistalFull           No       Yes        pulsatile                +---------+---------------+---------+-----------+----------+--------------+ PFV      Full                                                        +---------+---------------+---------+-----------+----------+--------------+ POP      Full           No  Yes        pulsatile                +---------+---------------+---------+-----------+----------+--------------+ PTV      Full                                                         +---------+---------------+---------+-----------+----------+--------------+ PERO     Full                                                        +---------+---------------+---------+-----------+----------+--------------+   +---------+---------------+---------+-----------+----------+--------------+ LEFT     CompressibilityPhasicitySpontaneityPropertiesThrombus Aging +---------+---------------+---------+-----------+----------+--------------+ CFV      Full           No       Yes        pulsatile                +---------+---------------+---------+-----------+----------+--------------+ SFJ      Full                                                        +---------+---------------+---------+-----------+----------+--------------+ FV Prox  Full           No       Yes        pulsatile                +---------+---------------+---------+-----------+----------+--------------+ FV Mid   Full           No       Yes        pulsatile                +---------+---------------+---------+-----------+----------+--------------+ FV DistalFull           No       Yes        pulsatile                +---------+---------------+---------+-----------+----------+--------------+ PFV      Full                                                        +---------+---------------+---------+-----------+----------+--------------+ POP      Full           No       Yes        pulsatile                +---------+---------------+---------+-----------+----------+--------------+ PTV      Full                                                        +---------+---------------+---------+-----------+----------+--------------+ PERO     None  No       No                   Acute          +---------+---------------+---------+-----------+----------+--------------+     Summary: BILATERAL: -No evidence of popliteal cyst, bilaterally. RIGHT: - There is no evidence of deep vein thrombosis  in the lower extremity.  LEFT: - Findings consistent with acute deep vein thrombosis involving the left peroneal veins.   *See table(s) above for measurements and observations. Electronically signed by Genny Kid MD on 04/16/2024 at 10:57:31 AM.    Final    Scheduled Meds:  aspirin  EC  81 mg Oral Daily   Chlorhexidine  Gluconate Cloth  6 each Topical Daily   doxycycline   100 mg Oral Q12H   enoxaparin  (LOVENOX ) injection  70 mg Subcutaneous Q12H   feeding supplement  237 mL Oral BID BM   guaiFENesin   600 mg Oral BID   pantoprazole   40 mg Oral Daily   Continuous Infusions:  sodium chloride  10 mL/hr at 04/16/24 2221   cefTRIAXone  (ROCEPHIN )  IV 1 g (04/16/24 2225)     LOS: 4 days   Time spent:  Haydee Lipa, DO [facility]  If 7PM-7AM, please contact night-coverage [email or web address]  04/17/2024, 8:02 AM

## 2024-04-17 NOTE — Progress Notes (Signed)
 [facility] night cross cover note:   I was notified by the patient's RN that the patient is experiencing insomnia refractory to melatonin and is requesting something additional to help him sleep.  RN conveys that the patient is  alert and oriented x 4.   I subsequently ordered a one-time dose of trazodone  for sleep for this patient.      Camelia Cavalier, DO Hospitalist

## 2024-04-18 ENCOUNTER — Other Ambulatory Visit (HOSPITAL_COMMUNITY): Payer: Self-pay

## 2024-04-18 DIAGNOSIS — I50810 Right heart failure, unspecified: Secondary | ICD-10-CM

## 2024-04-18 DIAGNOSIS — J189 Pneumonia, unspecified organism: Secondary | ICD-10-CM | POA: Diagnosis not present

## 2024-04-18 DIAGNOSIS — N179 Acute kidney failure, unspecified: Secondary | ICD-10-CM | POA: Diagnosis not present

## 2024-04-18 DIAGNOSIS — J810 Acute pulmonary edema: Secondary | ICD-10-CM | POA: Diagnosis not present

## 2024-04-18 DIAGNOSIS — E871 Hypo-osmolality and hyponatremia: Secondary | ICD-10-CM | POA: Diagnosis not present

## 2024-04-18 LAB — CBC
HCT: 27 % — ABNORMAL LOW (ref 39.0–52.0)
Hemoglobin: 9.5 g/dL — ABNORMAL LOW (ref 13.0–17.0)
MCH: 30.6 pg (ref 26.0–34.0)
MCHC: 35.2 g/dL (ref 30.0–36.0)
MCV: 87.1 fL (ref 80.0–100.0)
Platelets: 250 10*3/uL (ref 150–400)
RBC: 3.1 MIL/uL — ABNORMAL LOW (ref 4.22–5.81)
RDW: 13.2 % (ref 11.5–15.5)
WBC: 4.4 10*3/uL (ref 4.0–10.5)
nRBC: 0 % (ref 0.0–0.2)

## 2024-04-18 MED ORDER — DOXYCYCLINE HYCLATE 100 MG PO TABS
100.0000 mg | ORAL_TABLET | Freq: Two times a day (BID) | ORAL | 0 refills | Status: AC
  Filled 2024-04-18: qty 4, 2d supply, fill #0

## 2024-04-18 MED ORDER — CEPHALEXIN 250 MG PO CAPS
250.0000 mg | ORAL_CAPSULE | Freq: Four times a day (QID) | ORAL | 0 refills | Status: AC
Start: 2024-04-18 — End: 2024-04-20
  Filled 2024-04-18: qty 8, 2d supply, fill #0

## 2024-04-18 MED ORDER — APIXABAN (ELIQUIS) VTE STARTER PACK (10MG AND 5MG)
ORAL_TABLET | ORAL | 0 refills | Status: AC
Start: 2024-04-18 — End: ?
  Filled 2024-04-18: qty 74, 30d supply, fill #0

## 2024-04-18 NOTE — Discharge Summary (Signed)
 Physician Discharge Summary  Kenneth Roberts JYN:[id number] DOB: [birth year] DOA: 04/13/2024  PCP: Patient, No Pcp Per  Admit date: 04/13/2024 Discharge date: 04/18/2024  Admitted From: Home Disposition: Home  Recommendations for Outpatient Follow-up:  Follow up with PCP in 1-2 weeks  Home Health: None Equipment/Devices: None  Discharge Condition: Stable CODE STATUS: Full Diet recommendation: Low-salt low-fat diet  Brief/Interim Summary: Kenneth Roberts is a 83 y.o. male with medical history significant for chronic diastolic heart failure, essential pretension, GERD, who is admitted to [facility] on 04/13/2024 with community-acquired pneumonia after presenting from home to [facility] ED complaining of shortness of breath with questionable dizziness and near syncope.   Patient admitted as above - noted submassive PE on echo and CTA with right heart strain.  Transition to ICU overnight for 24 hours to monitor given concern for hypotension and possible need for intervention with IR.  Thankfully given patient's stable nature IR notes no indication for intervention.  Mild anemia noted over the past 48 hours been stabilizing, no further indication for imaging or intervention.  Discontinue aspirin , continue Eliquis  at discharge.  Follow-up with PCP in 1 to 2 weeks to repeat H&H to ensure no worsening anemia.  Discharge Diagnoses:  Principal Problem:   CAP (community acquired pneumonia) Active Problems:   Essential hypertension   GERD (gastroesophageal reflux disease)   Atypical chest pain   AKI (acute kidney injury) (HCC)   Elevated troponin   Fall at home, initial encounter   Acute hyponatremia   Generalized weakness   Chronic diastolic CHF (congestive heart failure) (HCC)   RVF (right ventricular failure) (HCC)   Acute respiratory failure with hypoxia (HCC)   Acute pulmonary edema (HCC)  Acute mild asymptomatic anemia, GI bleed suspected but likely resolved -Hemoglobin stable, FOBT  positive with dark stool, consistent with prior bleed  - Given stable H&H over the past 24 hours patient stable for discharge, discontinue aspirin  as above   Sepsis secondary to community-acquired pneumonia - Sepsis criteria met with tachycardia, tachypnea plus source given pneumonia - Continue antibiotics until course completed   Acute PE with cor pulmonale, submassive Acute left peroneal DVT -Questionable subacute versus chronic given lack of clinical symptoms, hypoxia, tachycardia with prior history of PE -IR contacted - no indication for intervention at this time given his improvement   Hypotension multifactorial -Complicated by submassive PE and sepsis -Improving with supportive care   Presyncope/vertigo Ambulatory dysfunction and mechanical fall -Patient reports weakness as well as dizziness and difficulty ambulating -Imaging without obvious overt fracture or dislocation -Baseline ambulates without any assistance or device -PT/OT to follow; orthostatics negative   AKI, resolved Hypovolemic hyponatremia, mild - Baseline creatinine 0.8, at intake elevated to 1.6 - Downtrending appropriately, sodium approaching baseline   Pleuritic chest pain, ACS ruled out Minimally elevated troponin, supply/demand mismatch - Likely secondary to above   Heart failure, diastolic with preserved ejection fraction EF 60-65% - Currently well compensated, continue to advance diet as tolerated  Hypertension -DC home meds given ongoing normotension   GERD -resume home meds  Discharge Instructions  Discharge Instructions     Call MD for:  difficulty breathing, headache or visual disturbances   Complete by: As directed    Call MD for:  extreme fatigue   Complete by: As directed    Call MD for:  hives   Complete by: As directed    Call MD for:  persistant dizziness or light-headedness   Complete by: As directed    Call MD  for:  persistant nausea and vomiting   Complete by: As directed     Call MD for:  severe uncontrolled pain   Complete by: As directed    Call MD for:  temperature >100.4   Complete by: As directed    Diet - low sodium heart healthy   Complete by: As directed    Increase activity slowly   Complete by: As directed       Allergies as of 04/18/2024       Reactions   Gadolinium Derivatives Other (See Comments)   Unknown reaction   Prilosec [omeprazole ] Itching        Medication List     STOP taking these medications    amLODipine  5 MG tablet Commonly known as: NORVASC    losartan  100 MG tablet Commonly known as: COZAAR        TAKE these medications    Apixaban  Starter Pack (10mg  and 5mg ) Commonly known as: ELIQUIS  STARTER PACK Take as directed on package: start with two-5mg  tablets twice daily for 7 days. On day 8, switch to one-5mg  tablet twice daily.   cephALEXin  250 MG capsule Commonly known as: KEFLEX  Take 1 capsule (250 mg total) by mouth 4 (four) times daily for 2 days.   clotrimazole -betamethasone  cream Commonly known as: LOTRISONE  Apply 1 Application topically 2 (two) times daily as needed.   dorzolamide -timolol  2-0.5 % ophthalmic solution Commonly known as: COSOPT  Place 1 drop into both eyes 2 (two) times daily.   doxycycline  100 MG tablet Commonly known as: VIBRA -TABS Take 1 tablet (100 mg total) by mouth every 12 (twelve) hours for 2 days.   gabapentin 100 MG capsule Commonly known as: NEURONTIN Take 100 mg by mouth at bedtime.   Gemtesa 75 MG Tabs Generic drug: Vibegron Take 75 mg by mouth daily.   hydrOXYzine  50 MG tablet Commonly known as: ATARAX  Take 50 mg by mouth 2 (two) times daily as needed for itching (sleep).   Linzess 72 MCG capsule Generic drug: linaclotide Take 72 mcg by mouth daily as needed (for constipation).   pantoprazole  40 MG tablet Commonly known as: PROTONIX  Take 40 mg by mouth daily.   traZODone  100 MG tablet Commonly known as: DESYREL  Take 100 mg by mouth at bedtime.         Allergies  Allergen Reactions   Gadolinium Derivatives Other (See Comments)    Unknown reaction   Prilosec [Omeprazole ] Itching    Consultations: Cardiology, PCCM, IR  Procedures/Studies: VAS US  LOWER EXTREMITY VENOUS (DVT) Result Date: 04/16/2024  Lower Venous DVT Study Patient Name:  Kenneth Roberts  Date of Exam:   04/15/2024 Medical Rec #: [id number]        Accession #:    [id number] Date of Birth: 1940-11-13        Patient Gender: M Patient Age:   64 years Exam Location:  [facility] Procedure:      VAS US  LOWER EXTREMITY VENOUS (DVT) Referring Phys: Roz Cornelia --------------------------------------------------------------------------------  Indications: Pulmonary embolism.  Risk Factors: Hx of PE (2022). Comparison Study: Previous exam on 11/07/2020 was negative for DVT Performing Technologist: Arlyce Berger RVT, RDMS  Examination Guidelines: A complete evaluation includes B-mode imaging, spectral Doppler, color Doppler, and power Doppler as needed of all accessible portions of each vessel. Bilateral testing is considered an integral part of a complete examination. Limited examinations for reoccurring indications may be performed as noted. The reflux portion of the exam is performed with the patient in reverse Trendelenburg.  +---------+---------------+---------+-----------+----------+--------------+ RIGHT  CompressibilityPhasicitySpontaneityPropertiesThrombus Aging +---------+---------------+---------+-----------+----------+--------------+ CFV      Full           No       Yes        pulsatile                +---------+---------------+---------+-----------+----------+--------------+ SFJ      Full                                                        +---------+---------------+---------+-----------+----------+--------------+ FV Prox  Full           No       Yes        pulsatile                 +---------+---------------+---------+-----------+----------+--------------+ FV Mid   Full           No       Yes        pulsatile                +---------+---------------+---------+-----------+----------+--------------+ FV DistalFull           No       Yes        pulsatile                +---------+---------------+---------+-----------+----------+--------------+ PFV      Full                                                        +---------+---------------+---------+-----------+----------+--------------+ POP      Full           No       Yes        pulsatile                +---------+---------------+---------+-----------+----------+--------------+ PTV      Full                                                        +---------+---------------+---------+-----------+----------+--------------+ PERO     Full                                                        +---------+---------------+---------+-----------+----------+--------------+   +---------+---------------+---------+-----------+----------+--------------+ LEFT     CompressibilityPhasicitySpontaneityPropertiesThrombus Aging +---------+---------------+---------+-----------+----------+--------------+ CFV      Full           No       Yes        pulsatile                +---------+---------------+---------+-----------+----------+--------------+ SFJ      Full                                                        +---------+---------------+---------+-----------+----------+--------------+  FV Prox  Full           No       Yes        pulsatile                +---------+---------------+---------+-----------+----------+--------------+ FV Mid   Full           No       Yes        pulsatile                +---------+---------------+---------+-----------+----------+--------------+ FV DistalFull           No       Yes        pulsatile                 +---------+---------------+---------+-----------+----------+--------------+ PFV      Full                                                        +---------+---------------+---------+-----------+----------+--------------+ POP      Full           No       Yes        pulsatile                +---------+---------------+---------+-----------+----------+--------------+ PTV      Full                                                        +---------+---------------+---------+-----------+----------+--------------+ PERO     None           No       No                   Acute          +---------+---------------+---------+-----------+----------+--------------+     Summary: BILATERAL: -No evidence of popliteal cyst, bilaterally. RIGHT: - There is no evidence of deep vein thrombosis in the lower extremity.  LEFT: - Findings consistent with acute deep vein thrombosis involving the left peroneal veins.   *See table(s) above for measurements and observations. Electronically signed by Genny Kid MD on 04/16/2024 at 10:57:31 AM.    Final    CT Angio Chest Pulmonary Embolism (PE) W or WO Contrast Result Date: 04/14/2024 CLINICAL DATA:  PE suspected.  Chest pain. EXAM: CT ANGIOGRAPHY CHEST WITH CONTRAST TECHNIQUE: Multidetector CT imaging of the chest was performed using the standard protocol during bolus administration of intravenous contrast. Multiplanar CT image reconstructions and MIPs were obtained to evaluate the vascular anatomy. RADIATION DOSE REDUCTION: This exam was performed according to the departmental dose-optimization program which includes automated exposure control, adjustment of the mA and/or kV according to patient size and/or use of iterative reconstruction technique. CONTRAST:  75mL OMNIPAQUE  IOHEXOL  350 MG/ML SOLN COMPARISON:  Chest radiograph 04/14/2019 and CT 10/26/2021 FINDINGS: Cardiovascular: Acute pulmonary embolism at the bifurcation of the right pulmonary artery extending  into multiple lobar and segmental branches throughout the right lung. Additional segmental subsegmental pulmonary emboli in the left lower lobe. There is evidence of right heart strain with RV/LV ratio 3. Marked compression of the left ventricle. Small pericardial effusion. Normal caliber  thoracic aorta. Mild aortic atherosclerotic calcification. Mediastinum/Nodes: Trachea and esophagus are unremarkable. No thoracic adenopathy Lungs/Pleura: Diffuse bronchial wall thickening. Mucous plugs and bronchiectasis/bronchiolectasis in the bilateral lower lobes and right middle lobe. Peribronchovascular consolidation and ground-glass opacities greatest in the right lower and middle lobes. Some of the consolidation has a nodular appearance (for example series 6/image 110) measuring 17 x 22 mm. Additional centrilobular and tree-in-bud nodules greatest in the right lower and middle lobes. Trace bilateral pleural effusions. No pneumothorax. Upper Abdomen: No acute abnormality. Musculoskeletal: No acute fracture. Review of the MIP images confirms the above findings. IMPRESSION: 1. Positive for acute PE with CT evidence of right heart strain (RV/LV Ratio = 3) consistent with at least submassive (intermediate risk) PE. The presence of right heart strain has been associated with an increased risk of morbidity and mortality. Please refer to the Code PE Focused order set in EPIC. 2. Pulmonary findings compatible with bronchopneumonia. Follow-up in 8 weeks after treatment is recommended ensure resolution exclude underlying mass. 3. Trace bilateral pleural effusions. 4. Aortic Atherosclerosis (ICD10-I70.0). Critical Value/emergent results were called by telephone at the time of interpretation on 04/14/2024 at 7:23 pm to provider Dr. [facility] Lavender, who verbally acknowledged these results. Electronically Signed   By: Rozell Cornet M.D.   On: 04/14/2024 19:23   ECHOCARDIOGRAM COMPLETE Result Date: 04/14/2024    ECHOCARDIOGRAM REPORT    Patient Name:   Kenneth Roberts Date of Exam: 04/14/2024 Medical Rec #:  [id number]       Height:       70.0 in Accession #:    [id number]      Weight:       186.0 lb Date of Birth:  29-Aug-1941       BSA:          2.024 m Patient Age:    83 years        BP:           107/89 mmHg Patient Gender: M               HR:           98 bpm. Exam Location:  Inpatient Procedure: 2D Echo, Color Doppler and Cardiac Doppler (Both Spectral and Color            Flow Doppler were utilized during procedure). Indications:    Elevated Troponins R79.89  History:        Patient has prior history of Echocardiogram examinations, most                 recent 11/07/2020. CHF; Risk Factors:Hypertension, Dyslipidemia                 and Sleep Apnea.  Sonographer:    Sherline Distel Senior RDCS Referring Phys: [id number] Roxana Copier  Sonographer Comments: Technically difficult due to lung interference, active pneumonia at time of study IMPRESSIONS  1. Left ventricular ejection fraction, by estimation, is 65 to 70%. The left ventricle has normal function. The left ventricle has no regional wall motion abnormalities. There is mild left ventricular hypertrophy. Left ventricular diastolic parameters were normal.  2. Right ventricular systolic function is mildly reduced. The right ventricular size is severely enlarged. Tricuspid regurgitation signal is inadequate for assessing PA pressure.  3. Right atrial size was moderately dilated.  4. A small pericardial effusion is present. The pericardial effusion is localized near the right ventricle.  5. The mitral valve is normal in structure. No evidence of mitral valve regurgitation. No  evidence of mitral stenosis.  6. The aortic valve is grossly normal. There is mild calcification of the aortic valve. Aortic valve regurgitation is not visualized. Aortic valve sclerosis is present, with no evidence of aortic valve stenosis.  7. The inferior vena cava is dilated in size with <50% respiratory variability, suggesting  right atrial pressure of 15 mmHg. Comparison(s): Changes from prior study are noted. Conclusion(s)/Recommendation(s): Normal to hyperdynamic LV, but RV is severely enlarged with mildly reduced function. Small local pericardial effusion. Findings concerning for right heart strain. Communicated to Dr. Rudine Cos and Dr. Alroy Aspen. FINDINGS  Left Ventricle: Left ventricular ejection fraction, by estimation, is 65 to 70%. The left ventricle has normal function. The left ventricle has no regional wall motion abnormalities. The left ventricular internal cavity size was normal in size. There is  mild left ventricular hypertrophy. Left ventricular diastolic parameters were normal. Right Ventricle: The right ventricular size is severely enlarged. Right vetricular wall thickness was not well visualized. Right ventricular systolic function is mildly reduced. Tricuspid regurgitation signal is inadequate for assessing PA pressure. Left Atrium: Left atrial size was normal in size. Right Atrium: Right atrial size was moderately dilated. Pericardium: A small pericardial effusion is present. The pericardial effusion is localized near the right ventricle. Mitral Valve: The mitral valve is normal in structure. No evidence of mitral valve regurgitation. No evidence of mitral valve stenosis. Tricuspid Valve: The tricuspid valve is grossly normal. Tricuspid valve regurgitation is trivial. No evidence of tricuspid stenosis. Aortic Valve: The aortic valve is grossly normal. There is mild calcification of the aortic valve. Aortic valve regurgitation is not visualized. Aortic valve sclerosis is present, with no evidence of aortic valve stenosis. Pulmonic Valve: The pulmonic valve was not well visualized. Pulmonic valve regurgitation is not visualized. No evidence of pulmonic stenosis. Aorta: The aortic root and ascending aorta are structurally normal, with no evidence of dilitation. Venous: The inferior vena cava is dilated in size with less  than 50% respiratory variability, suggesting right atrial pressure of 15 mmHg. IAS/Shunts: The interatrial septum was not well visualized.  LEFT VENTRICLE PLAX 2D LVIDd:         2.90 cm   Diastology LVIDs:         2.10 cm   LV e' medial:    6.31 cm/s LV PW:         1.00 cm   LV E/e' medial:  5.9 LV IVS:        1.00 cm   LV e' lateral:   8.92 cm/s LVOT diam:     1.90 cm   LV E/e' lateral: 4.2 LV SV:         45 LV SV Index:   22 LVOT Area:     2.84 cm  RIGHT VENTRICLE RV Basal diam:  5.40 cm RV S prime:     9.90 cm/s TAPSE (M-mode): 1.5 cm LEFT ATRIUM             Index        RIGHT ATRIUM           Index LA diam:        2.60 cm 1.28 cm/m   RA Area:     18.90 cm LA Vol (A2C):   26.7 ml 13.19 ml/m  RA Volume:   60.40 ml  29.84 ml/m LA Vol (A4C):   18.8 ml 9.29 ml/m LA Biplane Vol: 22.4 ml 11.07 ml/m  AORTIC VALVE LVOT Vmax:   78.20 cm/s LVOT Vmean:  62.800 cm/s LVOT VTI:    0.160 m  AORTA Ao Root diam: 3.20 cm Ao Asc diam:  3.50 cm MITRAL VALVE MV Area (PHT): 3.21 cm    SHUNTS MV Decel Time: 236 msec    Systemic VTI:  0.16 m MV E velocity: 37.20 cm/s  Systemic Diam: 1.90 cm MV A velocity: 77.60 cm/s MV E/A ratio:  0.48 Sheryle Donning MD Electronically signed by Sheryle Donning MD Signature Date/Time: 04/14/2024/12:37:05 PM    Final    CT Head Wo Contrast Result Date: 04/13/2024 CLINICAL DATA:  Multiple falls with headaches and neck pain, initial encounter EXAM: CT HEAD WITHOUT CONTRAST CT CERVICAL SPINE WITHOUT CONTRAST TECHNIQUE: Multidetector CT imaging of the head and cervical spine was performed following the standard protocol without intravenous contrast. Multiplanar CT image reconstructions of the cervical spine were also generated. RADIATION DOSE REDUCTION: This exam was performed according to the departmental dose-optimization program which includes automated exposure control, adjustment of the mA and/or kV according to patient size and/or use of iterative reconstruction technique.  COMPARISON:  None Available. FINDINGS: CT HEAD FINDINGS Brain: No evidence of acute infarction, hemorrhage, hydrocephalus, extra-axial collection or mass lesion/mass effect. Chronic atrophic changes are noted. Vascular: No hyperdense vessel or unexpected calcification. Skull: Normal. Negative for fracture or focal lesion. Sinuses/Orbits: No acute finding. Other: None. CT CERVICAL SPINE FINDINGS Alignment: Loss of the normal cervical lordosis is noted. Skull base and vertebrae: 7 cervical segments are well visualized. Vertebral body height is well maintained. Multilevel disc space narrowing and osteophytic changes are seen. Facet hypertrophic changes are noted as well. The odontoid is within normal limits. Soft tissues and spinal canal: Surrounding soft tissue structures are within normal limits. Upper chest: Visualized lung apices are unremarkable. Other: None IMPRESSION: CT of the head: Chronic atrophic changes. CT of the cervical spine: Degenerative change without acute abnormality. Electronically Signed   By: Violeta Grey M.D.   On: 04/13/2024 19:37   CT Cervical Spine Wo Contrast Result Date: 04/13/2024 CLINICAL DATA:  Multiple falls with headaches and neck pain, initial encounter EXAM: CT HEAD WITHOUT CONTRAST CT CERVICAL SPINE WITHOUT CONTRAST TECHNIQUE: Multidetector CT imaging of the head and cervical spine was performed following the standard protocol without intravenous contrast. Multiplanar CT image reconstructions of the cervical spine were also generated. RADIATION DOSE REDUCTION: This exam was performed according to the departmental dose-optimization program which includes automated exposure control, adjustment of the mA and/or kV according to patient size and/or use of iterative reconstruction technique. COMPARISON:  None Available. FINDINGS: CT HEAD FINDINGS Brain: No evidence of acute infarction, hemorrhage, hydrocephalus, extra-axial collection or mass lesion/mass effect. Chronic atrophic  changes are noted. Vascular: No hyperdense vessel or unexpected calcification. Skull: Normal. Negative for fracture or focal lesion. Sinuses/Orbits: No acute finding. Other: None. CT CERVICAL SPINE FINDINGS Alignment: Loss of the normal cervical lordosis is noted. Skull base and vertebrae: 7 cervical segments are well visualized. Vertebral body height is well maintained. Multilevel disc space narrowing and osteophytic changes are seen. Facet hypertrophic changes are noted as well. The odontoid is within normal limits. Soft tissues and spinal canal: Surrounding soft tissue structures are within normal limits. Upper chest: Visualized lung apices are unremarkable. Other: None IMPRESSION: CT of the head: Chronic atrophic changes. CT of the cervical spine: Degenerative change without acute abnormality. Electronically Signed   By: Violeta Grey M.D.   On: 04/13/2024 19:37   DG Chest 2 View Result Date: 04/13/2024 CLINICAL DATA:  Chest pain short of breath  EXAM: CHEST - 2 VIEW COMPARISON:  08/30/2021 FINDINGS: Emphysema. Patchy right middle and lower lobe opacity. No pleural effusion. Normal cardiac size. No pneumothorax IMPRESSION: Patchy right middle and lower lobe opacity suspicious for pneumonia. Radiographic follow-up to resolution is recommended. Electronically Signed   By: Esmeralda Hedge M.D.   On: 04/13/2024 18:37     Subjective: No acute issues or events overnight   Discharge Exam: Vitals:   04/18/24 0750 04/18/24 1137  BP: 105/70 114/74  Pulse: 70 74  Resp: 18 18  Temp: 98 F (36.7 C) 98.5 F (36.9 C)  SpO2: 98% 98%   Vitals:   04/17/24 2324 04/18/24 0419 04/18/24 0750 04/18/24 1137  BP: 129/82 100/67 105/70 114/74  Pulse:  78 70 74  Resp: 17 15 18 18   Temp: 97.7 F (36.5 C) 98.7 F (37.1 C) 98 F (36.7 C) 98.5 F (36.9 C)  TempSrc: Oral Oral Oral Oral  SpO2: 100% 100% 98% 98%  Weight:  72.1 kg    Height:        General: Pt is alert, awake, not in acute distress Cardiovascular:  RRR, S1/S2 +, no rubs, no gallops Respiratory: CTA bilaterally, no wheezing, no rhonchi Abdominal: Soft, NT, ND, bowel sounds + Extremities: no edema, no cyanosis    The results of significant diagnostics from this hospitalization (including imaging, microbiology, ancillary and laboratory) are listed below for reference.     Microbiology: Recent Results (from the past 240 hours)  MRSA Next Gen by PCR, Nasal     Status: None   Collection Time: 04/15/24 12:18 AM   Specimen: Nasal Mucosa; Nasal Swab  Result Value Ref Range Status   MRSA by PCR Next Gen NOT DETECTED NOT DETECTED Final    Comment: (NOTE) The GeneXpert MRSA Assay (FDA approved for NASAL specimens only), is one component of a comprehensive MRSA colonization surveillance program. It is not intended to diagnose MRSA infection nor to guide or monitor treatment for MRSA infections. Test performance is not FDA approved in patients less than 57 years old. Performed at [facility], [address] [address] [place], [place] [address]      Labs: BNP (last 3 results) Recent Labs    04/14/24 0320 04/15/24 0537  BNP 1,164.6* 740.6*   Basic Metabolic Panel: Recent Labs  Lab 04/13/24 1734 04/13/24 2333 04/14/24 0320 04/15/24 0537 04/16/24 0355 04/17/24 0549  NA 128*  --  129* 131* 131* 130*  K 5.1  --  4.1 4.1 3.7 3.7  CL 100  --  98 103 102 103  CO2 19*  --  17* 19* 19* 21*  GLUCOSE 103*  --  100* 96 101* 109*  BUN 21  --  20 14 9 10   CREATININE 1.62*  --  1.43* 1.27* 1.11 1.22  CALCIUM  9.2  --  8.9 8.6* 8.7* 8.5*  MG  --  1.9 1.7  --   --  1.4*  PHOS  --   --  3.0  --   --  3.2   Liver Function Tests: Recent Labs  Lab 04/14/24 0320  AST 15  ALT 11  ALKPHOS 44  BILITOT 1.1  PROT 5.8*  ALBUMIN 3.0*   No results for input(s): LIPASE, AMYLASE in the last 168 hours. No results for input(s): AMMONIA in the last 168 hours. CBC: Recent Labs  Lab 04/14/24 0320 04/15/24 0538 04/16/24 0355  04/17/24 0549 04/17/24 1452 04/18/24 0524  WBC 8.5 7.0 6.5 4.1  --  4.4  NEUTROABS 6.1  --   --   --   --   --   HGB 10.6* 11.9* 10.4* 9.4* 9.2* 9.5*  HCT 31.2* 33.6* 29.4* 26.3* 26.5* 27.0*  MCV 89.7 87.3 85.7 86.2  --  87.1  PLT 174 171 192 207  --  250   CBG: Recent Labs  Lab 04/15/24 0027  GLUCAP 89   Urinalysis    Component Value Date/Time   COLORURINE YELLOW 04/13/2024 1845   APPEARANCEUR CLEAR 04/13/2024 1845   LABSPEC 1.014 04/13/2024 1845   PHURINE 5.0 04/13/2024 1845   GLUCOSEU NEGATIVE 04/13/2024 1845   GLUCOSEU NEGATIVE 12/20/2015 1047   HGBUR NEGATIVE 04/13/2024 1845   BILIRUBINUR NEGATIVE 04/13/2024 1845   BILIRUBINUR NEG 03/25/2013 1442   KETONESUR 5 (A) 04/13/2024 1845   PROTEINUR NEGATIVE 04/13/2024 1845   UROBILINOGEN 0.2 12/20/2015 1047   NITRITE NEGATIVE 04/13/2024 1845   LEUKOCYTESUR NEGATIVE 04/13/2024 1845   Sepsis Labs Recent Labs  Lab 04/15/24 0538 04/16/24 0355 04/17/24 0549 04/18/24 0524  WBC 7.0 6.5 4.1 4.4   Microbiology Recent Results (from the past 240 hours)  MRSA Next Gen by PCR, Nasal     Status: None   Collection Time: 04/15/24 12:18 AM   Specimen: Nasal Mucosa; Nasal Swab  Result Value Ref Range Status   MRSA by PCR Next Gen NOT DETECTED NOT DETECTED Final    Comment: (NOTE) The GeneXpert MRSA Assay (FDA approved for NASAL specimens only), is one component of a comprehensive MRSA colonization surveillance program. It is not intended to diagnose MRSA infection nor to guide or monitor treatment for MRSA infections. Test performance is not FDA approved in patients less than 34 years old. Performed at [facility], [address], [place], [place] [address]      Time coordinating discharge: Over 30 minutes  SIGNED:   Haydee Lipa, DO [facility] 04/18/2024, 2:02 PM Pager   If 7PM-7AM, please contact night-coverage [email or web address]

## 2024-04-18 NOTE — Discharge Instructions (Signed)

## 2024-04-18 NOTE — Plan of Care (Signed)
 Discharged to home. PIV removed. Personal belongings returned. DC instruction provided.

## 2024-04-18 NOTE — Care Management Important Message (Signed)
 Important Message  Patient Details  Name: Kenneth Roberts MRN: [id number] Date of Birth: 01-15-1941   Important Message Given:  Yes - Medicare IM     Janith Melnick 04/18/2024, 10:29 AM

## 2024-04-18 NOTE — Progress Notes (Signed)
   04/18/24 1501  Spiritual Encounters  Type of Visit Initial  Care provided to: Patient  Conversation partners present during encounter Nurse  Referral source Clinical staff  Reason for visit Advance directives  OnCall Visit No  Interventions  Spiritual Care Interventions Made Established relationship of care and support;Compassionate presence  Intervention Outcomes  Outcomes Awareness of support;Awareness of health;Reduced anxiety   Upon arriving in the room, the nurse was informing the patient that he would be discharged and could contact his transportation. Spoke with the Pt. Stated he would take the packet home, review it with his family, and return it later.   I assured the Pt. That this is  perfectly fine and he could bring it back at his convenience.

## 2024-04-18 NOTE — Progress Notes (Signed)
 PT Cancellation Note  Patient Details Name: Kenneth Roberts MRN: [id number] DOB: 1941/05/18   Cancelled Treatment:    Reason Eval/Treat Not Completed: Other (comment) Attempted to see pt for PT tx. Pt received in bed, declining participation, nothing he can walk when he gets home. Pt continues to refuse all OOB mobility efforts. Will f/u as able.  Emaline Handsome, PT, DPT 04/18/24, 1:33 PM    Venetta Gill 04/18/2024, 1:32 PM

## 2024-04-18 NOTE — TOC Initial Note (Addendum)
 Transition of Care [facility] [address]) - Initial/Assessment Note    Patient Details  Name: Kenneth Roberts MRN: [id number] Date of Birth: July 02, 1941  [facility] CM/SW Contact:    Cosimo Diones, RN Phone Number: 04/18/2024, 2:18 PM  Clinical Narrative: Patient presented for shortness of breath. PTA patient states he was from home and has support of brother. Patient has insurance and Xarelto/Eliquis  zero co pay. Case Manager discussed home health and the patient declined services. Case Manager made physician and staff RN aware. Patient states his brother will transport him home via private vehicle. Patient states he has a PCP is Dr. Denece Finger off [address]. Patient will need to call to schedule an appointment. No further needs identified at this time.                    Expected Discharge Plan: Home/Self Care Barriers to Discharge: No Barriers Identified   Patient Goals and CMS Choice Patient states their goals for this hospitalization and ongoing recovery are:: plan to return home          Expected Discharge Plan and Services   Discharge Planning Services: CM Consult Post Acute Care Choice: NA Living arrangements for the past 2 months: Apartment Expected Discharge Date: 04/18/24                         HH Arranged: Refused HH          Prior Living Arrangements/Services Living arrangements for the past 2 months: Apartment Lives with:: Siblings Patient language and need for interpreter reviewed:: Yes Do you feel safe going back to the place where you live?: Yes      Need for Family Participation in Patient Care: No (Comment) Care giver support system in place?: No (comment)   Criminal Activity/Legal Involvement Pertinent to Current Situation/Hospitalization: No - Comment as needed  Activities of Daily Living   ADL Screening (condition at time of admission) Independently performs ADLs?: Yes (appropriate for developmental age) Is the patient deaf or  have difficulty hearing?: No Does the patient have difficulty seeing, even when wearing glasses/contacts?: No Does the patient have difficulty concentrating, remembering, or making decisions?: No  Permission Sought/Granted Permission sought to share information with : Family Supports, Case Manager                Emotional Assessment Appearance:: Appears stated age Attitude/Demeanor/Rapport: Engaged Affect (typically observed): Appropriate Orientation: : Oriented to Self, Oriented to Place, Oriented to  Time, Oriented to Situation Alcohol / Substance Use: Not Applicable Psych Involvement: No (comment)  Admission diagnosis:  CAP (community acquired pneumonia) [J18.9] AKI (acute kidney injury) (HCC) [N17.9] Pneumonia of right lung due to infectious organism, unspecified part of lung [J18.9] Patient Active Problem List   Diagnosis Date Noted   Acute respiratory failure with hypoxia (HCC) 04/15/2024   Acute pulmonary edema (HCC) 04/15/2024   AKI (acute kidney injury) (HCC) 04/14/2024   Elevated troponin 04/14/2024   Fall at home, initial encounter 04/14/2024   Acute hyponatremia 04/14/2024   Generalized weakness 04/14/2024   Chronic diastolic CHF (congestive heart failure) (HCC) 04/14/2024   RVF (right ventricular failure) (HCC) 04/14/2024   CAP (community acquired pneumonia) 04/13/2024   Abdominal bloating 12/10/2021   Hypoxemia 10/26/2021   Acute lower UTI 10/26/2021   COVID-19 virus infection 08/31/2021   Normocytic anemia 08/31/2021   Alcoholic ketoacidosis 01/15/2021   Alcohol withdrawal (HCC) 01/15/2021   Prolonged QT interval 01/15/2021   Acute  pulmonary embolism (HCC) 11/07/2020   Pleuritic chest pain 11/06/2020   Pulmonary embolism on right [facility]) 11/06/2020   Community acquired pneumonia 11/06/2020   Hiatal hernia 12/25/2016   Routine general medical examination at a health care facility 12/22/2015   Precordial chest pain 12/20/2015   Dyshidrotic hand dermatitis  04/13/2014   Lumbago 01/30/2014   HLD (hyperlipidemia) 07/21/2013   Atypical chest pain 05/22/2013   Hemorrhoids 04/14/2012   BPH (benign prostatic hyperplasia) 04/14/2012   History of colonic polyps 01/20/2011   Abdominal pain 05/15/2010   Essential hypertension 04/27/2007   GERD (gastroesophageal reflux disease) 04/27/2007   PCP:  Patient, No Pcp Per Pharmacy:   [facility] - [place], [place] - [address] [place] [address] Phone: [phone] Fax: [phone]  [facility] [address] [place] [address] Phone: [phone] Fax: [phone]     Social Drivers of Health (SDOH) Social History: SDOH Screenings   Food Insecurity: No Food Insecurity (04/14/2024)  Housing: Unknown (04/14/2024)  Transportation Needs: No Transportation Needs (04/14/2024)  Utilities: Not At Risk (04/14/2024)  Depression (PHQ2-9): Low Risk  (12/04/2020)  Social Connections: Socially Isolated (04/14/2024)  Tobacco Use: Medium Risk (04/14/2024)   SDOH Interventions:     Readmission Risk Interventions     No data to display

## 2024-04-18 NOTE — Plan of Care (Signed)
  Problem: Activity: Goal: Risk for activity intolerance will decrease Outcome: Progressing   Problem: Education: Goal: Knowledge of General Education information will improve Description: Including pain rating scale, medication(s)/side effects and non-pharmacologic comfort measures Outcome: Progressing   Problem: Nutrition: Goal: Adequate nutrition will be maintained Outcome: Progressing  Problem: Elimination: Goal: Will not experience complications related to bowel motility Outcome: Progressing

## 2024-04-27 ENCOUNTER — Ambulatory Visit (INDEPENDENT_AMBULATORY_CARE_PROVIDER_SITE_OTHER): Admitting: Podiatry

## 2024-04-27 ENCOUNTER — Encounter: Payer: Self-pay | Admitting: Podiatry

## 2024-04-27 DIAGNOSIS — B351 Tinea unguium: Secondary | ICD-10-CM

## 2024-04-27 DIAGNOSIS — M79676 Pain in unspecified toe(s): Secondary | ICD-10-CM

## 2024-04-27 NOTE — Progress Notes (Signed)
 This patient returns to the office for evaluation and treatment of long thick painful nails .  This patient is unable to trim his own nails since the patient cannot reach his feet.  Patient says the nails are painful walking and wearing his shoes.    He returns for preventive foot care services.  General Appearance  Alert, conversant and in no acute stress.  Vascular  Dorsalis pedis and posterior tibial  pulses are palpable  bilaterally.  Capillary return is within normal limits  bilaterally. Temperature is within normal limits  bilaterally.  Neurologic  Senn-Weinstein monofilament wire test within normal limits  bilaterally. Muscle power within normal limits bilaterally.  Nails Thick disfigured discolored nails with subungual debris  from hallux to fifth toes bilaterally. No evidence of bacterial infection or drainage bilaterally.  Orthopedic  No limitations of motion  feet .  No crepitus or effusions noted.  No bony pathology or digital deformities noted.  Prominent fifth metatarsal  B/L.   Skin  normotropic skin with no porokeratosis noted bilaterally.  No signs of infections or ulcers noted.  Callus sub 5th  B/L asymptomatic.  Onychomycosis  Pain in toes right foot  Pain in toes left foot  Callus  B/L.  Debridement  of nails  1-5  B/L with a nail nipper.  Nails were then filed using a dremel tool with no incidents.    RTC  10 week   Ruffin Cotton DPM

## 2024-07-08 ENCOUNTER — Encounter: Payer: Self-pay | Admitting: Podiatry

## 2024-07-08 ENCOUNTER — Ambulatory Visit: Admitting: Podiatry

## 2024-07-08 DIAGNOSIS — B351 Tinea unguium: Secondary | ICD-10-CM | POA: Diagnosis not present

## 2024-07-08 DIAGNOSIS — M79676 Pain in unspecified toe(s): Secondary | ICD-10-CM

## 2024-07-08 NOTE — Progress Notes (Addendum)
 This patient returns to the office for evaluation and treatment of long thick painful nails .  This patient is unable to trim his own nails since the patient cannot reach his feet.  Patient says the nails are painful walking and wearing his shoes.    He returns for preventive foot care services.  General Appearance  Alert, conversant and in no acute stress.  Vascular  Dorsalis pedis and posterior tibial  pulses are palpable  bilaterally.  Capillary return is within normal limits  bilaterally. Temperature is within normal limits  bilaterally.  Neurologic  Senn-Weinstein monofilament wire test within normal limits  bilaterally. Muscle power within normal limits bilaterally.  Nails Thick disfigured discolored nails with subungual debris  from hallux to fifth toes bilaterally. No evidence of bacterial infection or drainage bilaterally.  Orthopedic  No limitations of motion  feet .  No crepitus or effusions noted.  No bony pathology or digital deformities noted.  Prominent fifth metatarsal  B/L.   Skin  normotropic skin with no porokeratosis noted bilaterally.  No signs of infections or ulcers noted.  Callus sub 5th  B/L asymptomatic.  Onychomycosis  Pain in toes right foot  Pain in toes left foot  Callus  B/L.  Debridement  of nails  1-5  B/L with a nail nipper.  Nails were then filed using a dremel tool with no incidents.    RTC  10 week   [name] DPM /sm

## 2024-10-07 ENCOUNTER — Ambulatory Visit: Admitting: Podiatry

## 2024-10-26 ENCOUNTER — Encounter: Payer: Self-pay | Admitting: Podiatry

## 2024-10-26 ENCOUNTER — Ambulatory Visit: Admitting: Podiatry

## 2024-10-26 DIAGNOSIS — B351 Tinea unguium: Secondary | ICD-10-CM

## 2024-10-26 DIAGNOSIS — M79676 Pain in unspecified toe(s): Secondary | ICD-10-CM | POA: Diagnosis not present

## 2024-10-26 DIAGNOSIS — M216X9 Other acquired deformities of unspecified foot: Secondary | ICD-10-CM | POA: Diagnosis not present

## 2024-10-26 NOTE — Progress Notes (Signed)
 This patient returns to the office for evaluation and treatment of long thick painful nails .  This patient is unable to trim his own nails since the patient cannot reach his feet.  Patient says the nails are painful walking and wearing his shoes.    He returns for preventive foot care services.  General Appearance  Alert, conversant and in no acute stress.  Vascular  Dorsalis pedis and posterior tibial  pulses are palpable  bilaterally.  Capillary return is within normal limits  bilaterally. Temperature is within normal limits  bilaterally.  Neurologic  Senn-Weinstein monofilament wire test within normal limits  bilaterally. Muscle power within normal limits bilaterally.  Nails Thick disfigured discolored nails with subungual debris  from hallux to fifth toes bilaterally. No evidence of bacterial infection or drainage bilaterally.  Orthopedic  No limitations of motion  feet .  No crepitus or effusions noted.  No bony pathology or digital deformities noted.  Prominent fifth metatarsal  B/L.   Skin  normotropic skin with no porokeratosis noted bilaterally.  No signs of infections or ulcers noted.  Callus sub 5th  B/L asymptomatic.  Onychomycosis  Pain in toes right foot  Pain in toes left foot  Callus  B/L.  Debridement  of nails  1-5  B/L with a nail nipper.  Nails were then filed using a dremel tool with no incidents.    RTC  10 week   [name] DPM /sm

## 2025-01-04 ENCOUNTER — Ambulatory Visit: Admitting: Podiatry
